# Patient Record
Sex: Female | Born: 1935 | Race: White | Hispanic: No | Smoking: Never smoker
Health system: Southern US, Community
[De-identification: ages and names within clinical notes are randomized; demographics above are authoritative.]

## PROBLEM LIST (undated history)

## (undated) DIAGNOSIS — R011 Cardiac murmur, unspecified: Secondary | ICD-10-CM

## (undated) DIAGNOSIS — H9190 Unspecified hearing loss, unspecified ear: Secondary | ICD-10-CM

## (undated) DIAGNOSIS — K8689 Other specified diseases of pancreas: Secondary | ICD-10-CM

## (undated) DIAGNOSIS — R55 Syncope and collapse: Secondary | ICD-10-CM

## (undated) DIAGNOSIS — F329 Major depressive disorder, single episode, unspecified: Secondary | ICD-10-CM

## (undated) DIAGNOSIS — R079 Chest pain, unspecified: Secondary | ICD-10-CM

## (undated) DIAGNOSIS — R2681 Unsteadiness on feet: Secondary | ICD-10-CM

## (undated) DIAGNOSIS — F419 Anxiety disorder, unspecified: Secondary | ICD-10-CM

## (undated) DIAGNOSIS — R413 Other amnesia: Secondary | ICD-10-CM

## (undated) DIAGNOSIS — R002 Palpitations: Secondary | ICD-10-CM

## (undated) DIAGNOSIS — J189 Pneumonia, unspecified organism: Secondary | ICD-10-CM

## (undated) DIAGNOSIS — IMO0001 Reserved for inherently not codable concepts without codable children: Secondary | ICD-10-CM

## (undated) DIAGNOSIS — R0602 Shortness of breath: Secondary | ICD-10-CM

## (undated) DIAGNOSIS — J8410 Pulmonary fibrosis, unspecified: Secondary | ICD-10-CM

## (undated) DIAGNOSIS — I350 Nonrheumatic aortic (valve) stenosis: Secondary | ICD-10-CM

## (undated) DIAGNOSIS — R5381 Other malaise: Secondary | ICD-10-CM

## (undated) DIAGNOSIS — M069 Rheumatoid arthritis, unspecified: Secondary | ICD-10-CM

## (undated) DIAGNOSIS — N390 Urinary tract infection, site not specified: Secondary | ICD-10-CM

## (undated) HISTORY — DX: Unspecified hearing loss, unspecified ear: H91.90

## (undated) HISTORY — DX: Other specified diseases of pancreas: K86.89

## (undated) HISTORY — DX: Syncope and collapse: R55

## (undated) HISTORY — PX: ABDOMINAL HYSTERECTOMY: SHX81

## (undated) HISTORY — DX: Major depressive disorder, single episode, unspecified: F32.9

## (undated) HISTORY — DX: Chest pain, unspecified: R07.9

## (undated) HISTORY — PX: TRANSCATHETER AORTIC VALVE REPLACEMENT, TRANSFEMORAL: SHX6400

## (undated) HISTORY — DX: Shortness of breath: R06.02

## (undated) HISTORY — PX: SHOULDER SURGERY: SHX246

## (undated) HISTORY — DX: Nonrheumatic aortic (valve) stenosis: I35.0

## (undated) HISTORY — DX: Palpitations: R00.2

## (undated) HISTORY — PX: KNEE ARTHROSCOPY: SHX127

## (undated) HISTORY — DX: Urinary tract infection, site not specified: N39.0

## (undated) HISTORY — DX: Pulmonary fibrosis, unspecified: J84.10

## (undated) HISTORY — PX: CHOLECYSTECTOMY: SHX55

## (undated) HISTORY — PX: THYROIDECTOMY: SHX17

## (undated) HISTORY — DX: Other amnesia: R41.3

---

## 1996-11-08 HISTORY — PX: CARDIAC CATHETERIZATION: SHX172

## 1999-03-01 ENCOUNTER — Other Ambulatory Visit: Admission: RE | Admit: 1999-03-01 | Discharge: 1999-03-01 | Payer: Self-pay | Admitting: Obstetrics and Gynecology

## 1999-04-08 ENCOUNTER — Inpatient Hospital Stay (HOSPITAL_COMMUNITY): Admission: EM | Admit: 1999-04-08 | Discharge: 1999-04-11 | Payer: Self-pay | Admitting: Emergency Medicine

## 1999-04-08 ENCOUNTER — Encounter: Payer: Self-pay | Admitting: Emergency Medicine

## 1999-04-09 ENCOUNTER — Encounter: Payer: Self-pay | Admitting: *Deleted

## 2000-05-29 ENCOUNTER — Other Ambulatory Visit: Admission: RE | Admit: 2000-05-29 | Discharge: 2000-05-29 | Payer: Self-pay | Admitting: Obstetrics and Gynecology

## 2001-06-29 ENCOUNTER — Other Ambulatory Visit: Admission: RE | Admit: 2001-06-29 | Discharge: 2001-06-29 | Payer: Self-pay | Admitting: Obstetrics and Gynecology

## 2001-07-21 ENCOUNTER — Encounter: Admission: RE | Admit: 2001-07-21 | Discharge: 2001-07-21 | Payer: Self-pay | Admitting: Internal Medicine

## 2001-07-21 ENCOUNTER — Encounter: Payer: Self-pay | Admitting: Internal Medicine

## 2002-10-05 ENCOUNTER — Encounter (INDEPENDENT_AMBULATORY_CARE_PROVIDER_SITE_OTHER): Payer: Self-pay | Admitting: *Deleted

## 2002-10-05 ENCOUNTER — Ambulatory Visit (HOSPITAL_COMMUNITY): Admission: RE | Admit: 2002-10-05 | Discharge: 2002-10-05 | Payer: Self-pay | Admitting: Gastroenterology

## 2002-10-19 ENCOUNTER — Encounter: Payer: Self-pay | Admitting: Emergency Medicine

## 2002-10-19 ENCOUNTER — Inpatient Hospital Stay (HOSPITAL_COMMUNITY): Admission: EM | Admit: 2002-10-19 | Discharge: 2002-11-03 | Payer: Self-pay | Admitting: Emergency Medicine

## 2002-10-20 ENCOUNTER — Encounter (HOSPITAL_BASED_OUTPATIENT_CLINIC_OR_DEPARTMENT_OTHER): Payer: Self-pay | Admitting: Internal Medicine

## 2002-10-25 ENCOUNTER — Encounter (HOSPITAL_BASED_OUTPATIENT_CLINIC_OR_DEPARTMENT_OTHER): Payer: Self-pay | Admitting: Internal Medicine

## 2002-10-26 ENCOUNTER — Encounter: Payer: Self-pay | Admitting: Gastroenterology

## 2002-10-26 ENCOUNTER — Encounter (HOSPITAL_BASED_OUTPATIENT_CLINIC_OR_DEPARTMENT_OTHER): Payer: Self-pay | Admitting: Internal Medicine

## 2002-10-27 ENCOUNTER — Encounter (HOSPITAL_BASED_OUTPATIENT_CLINIC_OR_DEPARTMENT_OTHER): Payer: Self-pay | Admitting: Internal Medicine

## 2004-04-16 ENCOUNTER — Encounter: Admission: RE | Admit: 2004-04-16 | Discharge: 2004-04-16 | Payer: Self-pay | Admitting: Internal Medicine

## 2004-08-08 ENCOUNTER — Encounter: Admission: RE | Admit: 2004-08-08 | Discharge: 2004-08-08 | Payer: Self-pay | Admitting: Gastroenterology

## 2004-10-11 ENCOUNTER — Emergency Department (HOSPITAL_COMMUNITY): Admission: EM | Admit: 2004-10-11 | Discharge: 2004-10-11 | Payer: Self-pay | Admitting: Emergency Medicine

## 2005-10-21 ENCOUNTER — Emergency Department (HOSPITAL_COMMUNITY): Admission: EM | Admit: 2005-10-21 | Discharge: 2005-10-21 | Payer: Self-pay | Admitting: Emergency Medicine

## 2005-12-06 ENCOUNTER — Encounter: Admission: RE | Admit: 2005-12-06 | Discharge: 2005-12-06 | Payer: Self-pay | Admitting: Internal Medicine

## 2005-12-23 ENCOUNTER — Ambulatory Visit (HOSPITAL_COMMUNITY): Admission: RE | Admit: 2005-12-23 | Discharge: 2005-12-23 | Payer: Self-pay | Admitting: Gastroenterology

## 2006-01-15 ENCOUNTER — Inpatient Hospital Stay (HOSPITAL_COMMUNITY): Admission: AD | Admit: 2006-01-15 | Discharge: 2006-01-21 | Payer: Self-pay | Admitting: Internal Medicine

## 2006-01-20 ENCOUNTER — Encounter (INDEPENDENT_AMBULATORY_CARE_PROVIDER_SITE_OTHER): Payer: Self-pay | Admitting: *Deleted

## 2006-12-01 ENCOUNTER — Encounter: Admission: RE | Admit: 2006-12-01 | Discharge: 2006-12-01 | Payer: Self-pay | Admitting: Gastroenterology

## 2008-02-24 ENCOUNTER — Encounter: Admission: RE | Admit: 2008-02-24 | Discharge: 2008-02-24 | Payer: Self-pay | Admitting: Internal Medicine

## 2008-04-09 ENCOUNTER — Encounter: Admission: RE | Admit: 2008-04-09 | Discharge: 2008-04-09 | Payer: Self-pay | Admitting: Internal Medicine

## 2008-05-07 ENCOUNTER — Inpatient Hospital Stay (HOSPITAL_COMMUNITY): Admission: EM | Admit: 2008-05-07 | Discharge: 2008-05-12 | Payer: Self-pay | Admitting: Emergency Medicine

## 2008-06-17 ENCOUNTER — Inpatient Hospital Stay (HOSPITAL_COMMUNITY): Admission: EM | Admit: 2008-06-17 | Discharge: 2008-06-22 | Payer: Self-pay | Admitting: Emergency Medicine

## 2008-06-21 ENCOUNTER — Ambulatory Visit: Payer: Self-pay | Admitting: Surgery

## 2008-06-21 ENCOUNTER — Encounter (HOSPITAL_BASED_OUTPATIENT_CLINIC_OR_DEPARTMENT_OTHER): Payer: Self-pay | Admitting: Internal Medicine

## 2009-10-02 ENCOUNTER — Emergency Department (HOSPITAL_COMMUNITY): Admission: EM | Admit: 2009-10-02 | Discharge: 2009-10-03 | Payer: Self-pay | Admitting: Emergency Medicine

## 2010-01-31 ENCOUNTER — Encounter: Admission: RE | Admit: 2010-01-31 | Discharge: 2010-01-31 | Payer: Self-pay | Admitting: Internal Medicine

## 2010-03-23 ENCOUNTER — Encounter: Admission: RE | Admit: 2010-03-23 | Discharge: 2010-03-23 | Payer: Self-pay | Admitting: Internal Medicine

## 2010-10-19 ENCOUNTER — Emergency Department (HOSPITAL_COMMUNITY)
Admission: EM | Admit: 2010-10-19 | Discharge: 2010-10-19 | Payer: Self-pay | Source: Home / Self Care | Admitting: Emergency Medicine

## 2010-11-12 ENCOUNTER — Encounter: Payer: Self-pay | Admitting: Orthopedic Surgery

## 2011-01-03 ENCOUNTER — Emergency Department (HOSPITAL_COMMUNITY): Payer: Medicare Other

## 2011-01-03 ENCOUNTER — Inpatient Hospital Stay (HOSPITAL_COMMUNITY)
Admission: EM | Admit: 2011-01-03 | Discharge: 2011-01-05 | DRG: 392 | Disposition: A | Discharge status: Home / Self Care | Payer: Medicare Other | Attending: Gastroenterology | Admitting: Gastroenterology

## 2011-01-03 DIAGNOSIS — E860 Dehydration: Secondary | ICD-10-CM | POA: Diagnosis present

## 2011-01-03 DIAGNOSIS — A0811 Acute gastroenteropathy due to Norwalk agent: Principal | ICD-10-CM | POA: Diagnosis present

## 2011-01-03 DIAGNOSIS — D72829 Elevated white blood cell count, unspecified: Secondary | ICD-10-CM | POA: Diagnosis present

## 2011-01-03 DIAGNOSIS — M069 Rheumatoid arthritis, unspecified: Secondary | ICD-10-CM | POA: Diagnosis present

## 2011-01-03 DIAGNOSIS — T380X5A Adverse effect of glucocorticoids and synthetic analogues, initial encounter: Secondary | ICD-10-CM | POA: Diagnosis present

## 2011-01-03 DIAGNOSIS — E2749 Other adrenocortical insufficiency: Secondary | ICD-10-CM | POA: Diagnosis present

## 2011-01-03 LAB — AMYLASE: Amylase: 24 U/L (ref 0–105)

## 2011-01-03 LAB — POCT I-STAT, CHEM 8
BUN: 22 mg/dL (ref 6–23)
Calcium, Ion: 0.91 mmol/L — ABNORMAL LOW (ref 1.12–1.32)
Chloride: 100 mEq/L (ref 96–112)
Glucose, Bld: 144 mg/dL — ABNORMAL HIGH (ref 70–99)
HCT: 49 % — ABNORMAL HIGH (ref 36.0–46.0)
Hemoglobin: 16.7 g/dL — ABNORMAL HIGH (ref 12.0–15.0)
Potassium: 3.8 mEq/L (ref 3.5–5.1)
Sodium: 138 mEq/L (ref 135–145)

## 2011-01-03 LAB — DIFFERENTIAL
Basophils Absolute: 0 10*3/uL (ref 0.0–0.1)
Eosinophils Absolute: 0.1 10*3/uL (ref 0.0–0.7)
Lymphocytes Relative: 15 % (ref 12–46)
Monocytes Relative: 10 % (ref 3–12)
Neutro Abs: 10 10*3/uL — ABNORMAL HIGH (ref 1.7–7.7)
Neutrophils Relative %: 74 % (ref 43–77)

## 2011-01-03 LAB — CBC
Hemoglobin: 15.4 g/dL — ABNORMAL HIGH (ref 12.0–15.0)
MCH: 31.3 pg (ref 26.0–34.0)
Platelets: 239 10*3/uL (ref 150–400)
RBC: 4.92 MIL/uL (ref 3.87–5.11)
WBC: 13.5 10*3/uL — ABNORMAL HIGH (ref 4.0–10.5)

## 2011-01-03 LAB — LIPASE, BLOOD: Lipase: 16 U/L (ref 11–59)

## 2011-01-04 LAB — COMPREHENSIVE METABOLIC PANEL
ALT: 16 U/L (ref 0–35)
AST: 17 U/L (ref 0–37)
Alkaline Phosphatase: 41 U/L (ref 39–117)
CO2: 24 mEq/L (ref 19–32)
Calcium: 6.5 mg/dL — ABNORMAL LOW (ref 8.4–10.5)
GFR calc Af Amer: >60 mL/min (ref >60)
GFR calc non Af Amer: >60 mL/min (ref >60)
Glucose, Bld: 142 mg/dL — ABNORMAL HIGH (ref 70–99)
Potassium: 4.1 mEq/L (ref 3.5–5.1)
Sodium: 141 mEq/L (ref 135–145)

## 2011-01-04 LAB — CBC
HCT: 39.5 % (ref 36.0–46.0)
Hemoglobin: 13 g/dL (ref 12.0–15.0)
MCHC: 32.9 g/dL (ref 30.0–36.0)
RBC: 4.15 MIL/uL (ref 3.87–5.11)
WBC: 6.5 10*3/uL (ref 4.0–10.5)

## 2011-01-04 LAB — DIFFERENTIAL
Basophils Absolute: 0 10*3/uL (ref 0.0–0.1)
Basophils Relative: 0 % (ref 0–1)
Lymphocytes Relative: 10 % — ABNORMAL LOW (ref 12–46)
Neutro Abs: 5.7 10*3/uL (ref 1.7–7.7)
Neutrophils Relative %: 89 % — ABNORMAL HIGH (ref 43–77)

## 2011-01-04 LAB — AMYLASE: Amylase: 34 U/L (ref 0–105)

## 2011-01-08 NOTE — Discharge Summary (Signed)
NAMEONEY, TATLOCK NO.:  [id number]  MEDICAL RECORD NO.:  [id number]           PATIENT TYPE:  I  LOCATION:  5527                         FACILITY:  [facility]  PHYSICIAN:  Barry Dienes. Eloise Harman, M.D.DATE OF BIRTH:  31-May-1935  DATE OF ADMISSION:  01/03/2011 DATE OF DISCHARGE:                        DISCHARGE SUMMARY - REFERRING   CHIEF COMPLAINT:  Nausea, vomiting, and diarrhea.  HISTORY OF PRESENT ILLNESS:  The patient is a 76 year old Caucasian woman who is well known to me.  She was in her usual state of fairly good health until 2 nights ago, when she developed nausea, vomiting, and diarrhea.  Her symptoms persisted the next day with multiple episodes of green-colored vomitus and brown diarrhea.  This was not associated with fever or chills, but was associated with a mild dry cough and mild shortness of breath.  She denied substernal chest pain.  She became gradually weaker and called 911, presented to the emergency room for evaluation.  In the emergency room, she was given initial high-dose IV fluids and antiemetics and currently feels bit better although she continues to have mild nausea.  She denied associated symptoms of substernal chest pain.  Her last antibiotics was Z-Pak on December 07, 2010.  At that time, she was also treated with a burst of prednisone for what seemed to be acute bronchitis with wheezing.  PAST MEDICAL HISTORY:  Moderately severe rheumatoid arthritis for which she had been treated with Enbrel in the past and methotrexate.  Due to cost concern, she is currently controlled on prednisone with last Rheumatology notes indicating a dose of 7 mg per day.  She also has had atypical chest discomfort with Cardiolite exercise test normal, recurrent pancreatitis with intermittent diarrhea felt secondary to pancreatic insufficiency, anxiety, depression, low back pain, osteoporosis, vitamin D deficiency, and chronic fatigue.  MEDICATIONS PRIOR TO  HOSPITALIZATION: 1. Alendronate 70 mg p.o. once weekly. 2. Xanax 1 mg p.o. t.i.d. 3. Amitriptyline 50 mg p.o. at bedtime. 4. Bupropion 75 mg p.o. once daily. 5. Vicodin 5/500 as needed for moderate pain. 6. Remeron 30 mg p.o. at bedtime. 7. Prednisone 7 mg p.o. daily. 8. Vitamin D 50,000 units p.o. once weekly.  ALLERGIES AND DRUG INTOLERANCES:  PENICILLIN and ARAVA was associated with diarrhea.  PAST SURGICAL HISTORY:  In 1960s, open cholecystectomy; in 1960s, total abdominal hysterectomy and bilateral salpingo-oophorectomy;  in 1970, left knee operation; in 1996, bunionectomy; in 1996, left knee operation; in 1999, left rotator cuff surgery; in July 2009, left hip fracture with open reduction and internal fixation.  PHYSICIANS INVOLVED IN CARE: 1. Octaviano Batty (Gynecology). 2. Thereasa Solo. Little, MD (Cardiology). 3. Llana Aliment. Randa Evens, MD (GI). 4. Lucrezia Starch. Earlene Plater, MD (Urology). 5. Georges Lynch. Darrelyn Hillock, MD (Orthopedics). 6. Lemmie Evens, MD (Rheumatology).  SOCIAL HISTORY:  She has been a widow since 1999, when her husband was killed while hunting.  She has 3 children and 1 granddaughter.  She was retired.  She has no history of tobacco or alcohol abuse.  FAMILY HISTORY:  Father died in his 55s from colon cancer and complications of diabetes mellitus, type 2.  Her  mother died at age 71 years.  Two of her sisters have had diabetes mellitus, and one sister died from colon cancer.  REVIEW OF SYSTEMS:  No recent fever, chills, productive cough, substernal chest pain, dysuria, frequency, or change in chronic arthralgias, affecting many joints including the bilateral shoulders, bilateral wrists and hands, and bilateral knees.  She has had no recent falls.  She denied symptoms of anxiety or depression at present.  INITIAL PHYSICAL EXAMINATION:  VITAL SIGNS:  Blood pressure 105/70, pulse 92, respirations 18, temperature 98.1, pulse oxygen saturation 95% on room air. GENERAL:  She  is a frail elderly white woman who looked fatigued while lying partially upright on the bed. HEAD, EYES, EARS, NOSE AND THROAT:  Within normal limits. NECK:  Supple without jugular venous distention or carotid bruit. CHEST:  Clear to auscultation. HEART:  Regular rate and rhythm with a systolic ejection murmur, grade 1/6 at the left sternal border. ABDOMEN:  Normal bowel sounds and no hepatosplenomegaly or tenderness. EXTREMITIES:  Significant for typical hand changes of rheumatoid arthritis and there was no peripheral edema. NEUROLOGIC:  Nonfocal.  INITIAL LABORATORY STUDIES:  Serum sodium was 138, potassium 3.8, chloride 100, BUN 22, creatinine 0.8, glucose 144, ionized calcium was 0.91, total carbon dioxide 25.  Hemoglobin 16.7, hematocrit 49, white blood cell count was 13.5 with 74% neutrophils, platelets were 239.  Chest x-ray showed mild left basilar atelectasis with no evidence of pleural effusion or pneumothorax.  IMPRESSION AND PLAN:  Nausea, vomiting, and diarrhea.  1. Nausea and vomiting:  This is most likely from a acute viral     gastroenteritis.  In our community, there have been numerous cases     of norovirus infection and her symptoms fit well for this.  She     does not appear to have a bacterial infection such as Clostridium     difficile colitis as this would likely cause some evidence of ileus     and a more extreme leukocytosis.  Her mild leukocytosis is most     likely from her chronic prednisone treatment.  Her symptoms are not     likely from acute pancreatitis although she has a history of     several episodes of well-documented pancreatitis.  We will check     serum amylase and lipase levels.  We will continue moderate dose IV     fluids and scheduled antiemetics.  We will gradually have her     transition from full liquids diet to a regular diet. 2. Adrenal insufficiency:  She has been on some dose of prednisone for     several years.  It is presumed  that she has iatrogenic adrenal     insufficiency.  We will temporary treat her with Solu-Medrol IV and     then transition her to a     rapid tapering dose of prednisone as an outpatient.  Her usual dose     of prednisone is 7 mg daily. 3. Rheumatoid arthritis:  Relatively stable on prednisone suppression.     Unfortunately, she was not able to use more modern disease-     modifying agents due to cost concerns.          ______________________________ Barry Dienes Eloise Harman, M.D.     DGP/MEDQ  D:  01/04/2011  T:  01/04/2011  Job:  [id number]  cc:   Lemmie Evens, M.D.  Electronically Signed by Jarome Matin M.D. on 01/08/2011 09:59:34 AM

## 2011-01-22 LAB — URINE MICROSCOPIC-ADD ON

## 2011-01-22 LAB — URINALYSIS, ROUTINE W REFLEX MICROSCOPIC
Bilirubin Urine: NEGATIVE
Glucose, UA: NEGATIVE mg/dL
Protein, ur: 100 mg/dL — AB
Specific Gravity, Urine: 1.016 (ref 1.005–1.030)

## 2011-01-22 LAB — CBC
Hemoglobin: 14.1 g/dL (ref 12.0–15.0)
MCHC: 34.1 g/dL (ref 30.0–36.0)
MCV: 92.4 fL (ref 78.0–100.0)
RBC: 4.47 MIL/uL (ref 3.87–5.11)
RDW: 14.3 % (ref 11.5–15.5)

## 2011-01-22 LAB — URINE CULTURE

## 2011-01-22 LAB — COMPREHENSIVE METABOLIC PANEL
BUN: 17 mg/dL (ref 6–23)
CO2: 20 mEq/L (ref 19–32)
Calcium: 8.9 mg/dL (ref 8.4–10.5)
Creatinine, Ser: 0.96 mg/dL (ref 0.4–1.2)
GFR calc non Af Amer: 57 mL/min — ABNORMAL LOW (ref >60)
Glucose, Bld: 107 mg/dL — ABNORMAL HIGH (ref 70–99)

## 2011-01-22 LAB — DIFFERENTIAL
Eosinophils Absolute: 0 10*3/uL (ref 0.0–0.7)
Lymphocytes Relative: 11 % — ABNORMAL LOW (ref 12–46)
Lymphs Abs: 1.3 10*3/uL (ref 0.7–4.0)
Neutro Abs: 9.8 10*3/uL — ABNORMAL HIGH (ref 1.7–7.7)
Neutrophils Relative %: 77 % (ref 43–77)

## 2011-01-22 LAB — LIPASE, BLOOD: Lipase: 16 U/L (ref 11–59)

## 2011-01-22 NOTE — Discharge Summary (Signed)
[name], [name] NO.:  [id number]  MEDICAL RECORD NO.:  [id number]           PATIENT TYPE:  I  LOCATION:  5527                         FACILITY:  [facility]  PHYSICIAN:  Larina Earthly, M.D.        DATE OF BIRTH:  1934-10-23  DATE OF ADMISSION:  01/03/2011 DATE OF DISCHARGE:  01/05/2011                              DISCHARGE SUMMARY   DISCHARGE DIAGNOSES: 1. Presumed viral gastroenteritis associated with significant nausea,     vomiting, and diarrhea. 2. Adrenal insufficiency, prednisone dependent for multiple years with     hemodynamic instability, on prednisone taper. 3. Rheumatoid arthritis. 4. History of recurrent pancreatitis with intermittent diarrhea,     thought secondary to pancreatic insufficiency. 5. Mood disorder with anxiety and depression with mixed features. 6. Osteoporosis. 7. Vitamin D deficiency. 8. Chronic fatigue.  DISCHARGE MEDICATIONS: 1. Tylenol 500 mg t.i.d. p.r.n. 2. Prednisone taper starting at 60 mg, decreasing to 10 mg, to be seen     by primary care physician prior to the end of the taper. 3. Alendronate 70 mg p.o. every week. 4. Alprazolam 1 mg t.i.d. p.r.n. 5. Amitriptyline 25 mg 2 tablets p.o. nightly. 6. Bupropion 75 mg p.o. q.a.m. 7. Vicodin 1 tablet t.i.d. p.r.n. 8. Remeron 30 mg p.o. nightly. 9. Vitamin D2 50,000 international units weekly.  The patient was given a prescription for the prednisone taper.  Will have followup with Dr. Jarold Motto in the next 4-5 days prior to completion of taper to assess hemodynamic stability and electrolytes and renal parameters as indicated per Dr. Eloise Harman.  LABORATORY EVALUATION AND RADIOLOGY EVALUATION FROM THE DAY PRIOR TO DISCHARGE:  White blood cell count 6.5, hemoglobin 13.0, hematocrit 37.5%, and platelet count 186.  Sodium 141, potassium 4.1, BUN 4, creatinine 0.6, glucose 142, and serum CO2 of 24.  Liver function tests normal, amylase 34 and lipase 23.  Albumin 2.7 with calcium  6.5.  Chest x-ray reveals questionable bibasilar atelectasis.  HISTORY OF PRESENT ILLNESS:  Please see the history and physical dictated by my partner Dr. Jarome Matin on January 03, 2011, however, briefly this is a 76 year old Caucasian female with the above-mentioned past medical history who develops significant of sudden onset nausea, vomiting, and diarrhea that was persistent without any blood in stool or emesis.  This is not associated with fevers or chills, but was associated with a mild dry cough and some shortness of breath . denied any substernal chest pain.  She became weaker and called 911, presented to the emergency room for evaluation, given high dose IV fluids and antiemetics with some improvement of her symptoms, but continued mild nausea.  She was subsequently admitted for presumed viral gastroenteritis, did not appear to have a bacterial infection such as C. diff and she was without any evidence of any ileus or extreme leukocytosis, it was thought that her mild leukocytosis was secondary to chronic prednisone treatment.  It was not thought that her symptoms were secondary to acute pancreatitis, however, amylase and lipase levels were checked.  She was continued on IV fluids and scheduled antiemetics.  HOSPITAL COURSE:  The patient was  treated with the IV fluids and high- dose Solu-Medrol and remained hemodynamically stable.  Within 24 hours, the patient resumed her normal diet without nausea, vomiting, or diarrhea.  She had no fevers.  Her white blood cell count normalized. Her electrolytes were reasonable.  She had no acute abdominal findings and given stability of her caloric intake she was deemed appropriate for discharge on January 05, 2011, with a prednisone taper and quick followup. During her hospitalization, she did not have an exacerbation of her rheumatoid arthritis-type symptoms and again was ambulating around her room without difficulty prior to  discharge.  Greater than 30 minutes was spent on discharge planning, the patient evaluation and interview.     Larina Earthly, M.D.     RA/MEDQ  D:  01/05/2011  T:  01/06/2011  Job:  [id number]  cc:   Barry Dienes. Eloise Harman, M.D.  Electronically Signed by Larina Earthly M.D. on 01/22/2011 10:11:49 PM

## 2011-02-06 NOTE — H&P (Signed)
[name], [name] NO.:  [id number]  MEDICAL RECORD NO.:  [id number]           PATIENT TYPE:  I  LOCATION:  5527                         FACILITY:  [facility]  PHYSICIAN:  Barry Dienes. Eloise Harman, M.D.DATE OF BIRTH:  1935-08-16  DATE OF ADMISSION:  01/03/2011 DATE OF DISCHARGE:  01/05/2011                             HISTORY & PHYSICAL   CHIEF COMPLAINT:  Nausea, vomiting, and diarrhea.  HISTORY OF PRESENT ILLNESS:  The patient is a 76 year old Caucasian woman who is well-known to me.  She was in her usual state of fairly good health until two nights prior to admission when she developed nausea, vomiting, and diarrhea.  Her symptoms persisted the next day with multiple episodes of green colored vomitus and brown diarrhea. This was not associated with fever or chills, but was associated with a dry cough and mild shortness of breath.  She denies substernal chest pain.  She became gradually weaker and called 911, presented to the emergency room for evaluation.  In the emergency room, she was given initial high-dose IV fluids and antiemetics and at the time of my evaluation felt somewhat better although she continued to have mild nausea.  She denied associated symptoms of substernal chest pain.  Her last antibiotics were Z-Pak prescribed on December 07, 2010.  At that time, she was treated with a burst of prednisone for what seemed to be acute bronchitis with wheezing.  PAST MEDICAL HISTORY:  Moderately severe rheumatoid arthritis for which she has been treated with Enbrel in the past and methotrexate.  Due to cost concerns, she was currently treated solely with prednisone with her last Rheumatology notes in our office indicating a dose of 7 mg per day. She also has a history of atypical chest discomfort with a Cardiolite exercise test normal, recurrent pancreatitis with intermittent diarrhea felt secondary to pancreatic insufficiency, anxiety, depression, low back  pain, osteoporosis, vitamin D deficiency, and chronic fatigue.  MEDICATIONS PRIOR TO HOSPITALIZATION: 1. Alendronate 70 mg p.o. once weekly. 2. Xanax 1 mg p.o. t.i.d. 3. Amitriptyline 50 mg p.o. at bedtime. 4. Wellbutrin 75 mg p.o. once daily. 5. Vicodin 5/500 1 tablet p.o. three times daily as needed for     moderate pain. 6. Remeron 30 mg p.o. at bedtime. 7. Prednisone 7 mg p.o. daily. 8. Vitamin D 50,000 units p.o. once weekly.  ALLERGIES AND DRUG INTOLERANCES:  PENICILLIN and ARAVA (associated with diarrhea).  PAST SURGICAL HISTORY:  1960 open cholecystectomy, 1960 total abdominal hysterectomy and bilateral salpingo-oophorectomy, 1970 left knee operation, 1996 bunionectomy, 1996 left knee operation, 1999 left rotator cuff surgery, July 2009 left hip fracture with open reduction and internal fixation.  PHYSICIANS INVOLVED IN CARE: 1. Warnell Forester, MD (Gynecology). 2. Julieanne Manson, MD (Cardiology). 3. Carman Ching, MD (GI). 4. Lucrezia Starch. Earlene Plater, MD(Urology). 5. Georges Lynch. Darrelyn Hillock, MD (Orthopedics). 6. Lemmie Evens, MD (Rheumatology)  SOCIAL HISTORY:  She has been a widow since 1999 when her husband was killed while hunting.  She has three children and one granddaughter. She is retired.  She has no history of tobacco or alcohol abuse.  FAMILY HISTORY:  Her  father died in his 67s from colon cancer and complications of diabetes mellitus, type 2.  Her mother died at age 36 years.  Two of her sisters have diabetes mellitus and one sister died from colon cancer.  REVIEW OF SYSTEMS:  No recent fever, chills, productive cough, substernal chest pain, dysuria, frequency, or change in chronic arthralgias affecting many joints including bilateral shoulders, bilateral wrists and hands, and bilateral knees.  She has had no recent falls.  She denied symptoms of anxiety or depression at the time of admission.  INITIAL PHYSICAL EXAMINATION:  VITAL SIGNS:  Blood pressure  105/70, pulse 92, respirations 18, temperature 98.1, pulse oxygen saturation 95% on room air. GENERAL:  She is a frail elderly white woman who looked fatigued while lying partially upright in bed. HEAD, EYES, EARS, NOSE AND THROAT:  Within normal limits. NECK:  Supple without jugular venous distention or carotid bruit. CHEST:  Clear to auscultation. HEART:  Regular rate and rhythm with a systolic ejection murmur grade 1/6 at the left sternal border. ABDOMEN:  Normal bowel sounds and no hepatosplenomegaly or tenderness. EXTREMITIES:  Significant for typical changes of rheumatoid arthritis and no peripheral edema. NEUROLOGIC:  Nonfocal.  INITIAL LABORATORY STUDIES:  Serum sodium was 138, potassium 3.8, chloride 100, BUN 22, creatinine 0.8, glucose 144, ionized calcium was 0.91, total carbon dioxide 25.  A white blood cell count was 13.5 with 74% neutrophils, hemoglobin 16.7, hematocrit 49, platelets 239.  A chest x-ray showed mild left basilar atelectasis with no evidence of pleural effusion or pneumothorax.  IMPRESSION AND PLAN: 1. Nausea and vomiting:  This is most likely from an acute viral     gastroenteritis.  In our community, there have been many cases of     norovirus infection and her symptoms fit to this.  She does not     appear to have a bacterial infection such as Clostridium difficile     colitis as this would likely cause some evidence of ileus and more     extreme leukocytosis.  She has mild elevation of her white blood     cell count that is likely from chronic prednisone treatment.  Her     symptoms are not likely from acute pancreatitis, although she has a     history of several episodes of well documented pancreatitis.  We     will continue moderate dose IV fluids and scheduled antiemetics.     We will also check serum amylase and lipase levels.  We will     gradually have her transition from a full liquid diet to a regular     diet. 2. Adrenal insufficiency:   She has been on some dose of prednisone for     several years.  It is presumed that she has iatrogenic adrenal     insufficiency.  We will temporarily treat her with Solu-Medrol IV     and then transition her to     a rapidly tapering dose of prednisone as an outpatient.  Her usual     dose of prednisone is 7 mg daily. 3. Rheumatoid arthritis:  Relatively stable on prednisone suppression.     Unfortunately, she could not afford copayments with the disease     modifying agents.          ______________________________ Barry Dienes Eloise Harman, M.D.     DGP/MEDQ  D:  01/22/2011  T:  01/23/2011  Job:  [id number]  cc:   Lemmie Evens, M.D. Thereasa Solo.  Little, M.D. James L. Malon Kindle., M.D. Warnell Forester, MD  Electronically Signed by Jarome Matin M.D. on 02/06/2011 08:05:13 AM

## 2011-02-17 ENCOUNTER — Other Ambulatory Visit: Payer: Self-pay | Admitting: Gastroenterology

## 2011-03-05 NOTE — Discharge Summary (Signed)
[name], [name] NO.:  [id number]   MEDICAL RECORD NO.:  [id number]          PATIENT TYPE:  INP   LOCATION:  1615                         [facility]ACILITY:  [facility]   PHYSICIAN:  Marlowe Kays, M.D.  DATE OF BIRTH:  1935/08/02   DATE OF ADMISSION:  05/07/2008  DATE OF DISCHARGE:                               DISCHARGE SUMMARY   DATE OF DISCHARGE:  Pending.   ADMISSION DIAGNOSES:  1. Displaced femoral neck fracture of the left hip.  2. Rheumatoid arthritis.  3. Anxiety.   DISCHARGE DIAGNOSES:  1. Displaced femoral neck fracture of the left hip.  2. Rheumatoid arthritis.  3. Anxiety.  4. Mild postoperative anemia.   OPERATION/PROCEDURE:  On May 07, 2008, the patient  the patient  underwent cemented bipolar hemiarthroplasty of the left hip.  Dooley L.  Idolina Primer, P.A.-C assisted.   CONSULTATIONS:  None.   BRIEF HISTORY:  This 76 year old white female was going to a bank here  in [place].  She was stepping up on the curb with her left foot and  it gave way.  She fell, could not get up, summoned help and was  brought to the emergency room where we saw her for an evaluation and  admission.  X-rays revealed displaced subcapital fracture of the left  hip.  She had eaten earlier in the day so we had to wait 6-8 hours after  her meal and took her to surgery for the above procedure.   COURSE IN THE HOSPITAL:  The patient tolerated the surgery quite well.  She was very slow in her early activity with physical therapy due to the  pain as well as the rather insecurity and lack of stamina with her  activities.  We felt that she would do well in a skilled nursing  facility for continued rehabilitation, physical therapy and occupational  therapy and our direction was towards that.  She had a considerable  amount of anxiety with activity, and it was felt that she would need  extra time for that to return home safely.  We allowed weightbearing as  tolerated.  She had a  mild hypotensive episode.  This was thought to be  due to the accumulation of medications including analgesics and those  were used very judiciously.   Neurovascular intact to the left lower extremity.  Dressings were  changed regularly.  Found to have some mild serous-type drainage which  was normal.   Today I discussed with the patient plans, hip precautions (which she is  following quite well).  I might add that she has a tendency to turn the  left leg inward and she is being reminded to turn the foot outward as  she ambulates.  This is a due to the adductor override of the lateral  musculature.   Once we ascertain a bed for her and find appropriate facility, then we  will go ahead with her transfer.   LABORATORY DATA:  Laboratory values in the hospital hematologically  showed a preoperative CBC with a slight elevation in white count at  11.7.  The hemoglobin was 13.1,  hematocrit 38.8.  Her hemoglobin was  postoperatively stayrd in the 10 level.  The last one on July 21  had  been 10.5.  She will continue on iron supplements.  Blood chemistries  remained normal.  Her blood pressure at dictation was 90/54.  She was  afebrile.  Electrocardiogram showed normal sinus rhythm with ST and T  wave abnormality.  Consider lateral ischemia and inferior ischemia.   DISCHARGE/PLAN:  The patient continued with her physical therapy,  weightbearing as tolerated to both lower extremities utilizing walker or  cane,  whichever she feels safe with.   DISCHARGE MEDICATIONS:  She did continue on these medications:  1. Prednisone 5 mg daily.  2. Xanax 0.5 mg tab t.i.d.  3. Propranolol 10 mg tab daily.  4. Prozac daily.  5. Indocin 10/650 tab q.4 h. p.r.n.  6. Hydrocodone 5/500 100 mg tab t.i.d. p.r.n.  7. Mirtazapine 30 mg 1 tablet at bedtime.  8. Fluoxetine HCl 20 mg capsule daily.  9. She will also continue on Robaxin 500 mg one p.o. q.6 h p.r.n.      muscle spasms.  10.Ferrous sulfate or  other iron equivalent 1 t.i.d..  11.She has been on stool softeners.  She can use stool softener of      choice at the nursing/rehab facility.   The patient is on Coumadin protocol since her surgery and has remain on  Coumadin protocol to keep the INR around 3 for at least 4 weeks after  date of surgery.  We will leave it to the pharmacist to adjust her  Coumadin.  She was taking 3 mg p.o. daily at 6 p.m. at the time of this  dictation.   She is to return to see Dr. Sedonia Small 2 weeks after date of surgery.  Staples can be removed and Steri-Strips applied 2 weeks after surgery as  well.  If you have any questions or problems, please call us at [phone], and if you have any medical concerns, her family physician is Dr.  Brunilda Payor.  Her rheumatologist is Dr. Jimmy Footman.      Dooley L. Cherlynn June.    ______________________________  Marlowe Kays, M.D.    DLU/MEDQ  D:  05/11/2008  T:  05/11/2008  Job:  [id number]   cc:   Vania Rea. Jarold Motto, MD, FACG, FACP, FAGA  [address]  [place]  [place] [address]   Lemmie Evens, M.D.  Fax: [phone]

## 2011-03-05 NOTE — H&P (Signed)
[name], POTTENGER NO.:  [id number]   MEDICAL RECORD NO.:  [id number]         PATIENT TYPE:  LINP   LOCATION:                               FACILITY:  [facility]   PHYSICIAN:  Georges Lynch. Gioffre, M.D.DATE OF BIRTH:  June 14, 1935   DATE OF ADMISSION:  05/12/2008  DATE OF DISCHARGE:                              HISTORY & PHYSICAL   CHIEF COMPLAINT:  Lower back and right buttocks pain.   HISTORY OF PRESENT ILLNESS:  This is a 76 year old female here today for  history and physical for upcoming decompressive lumbar laminectomy at L2-  3 with microdiskectomy at L2-3 on the left due to lower back and right  buttocks pain.  The patient has been evaluated by Dr. Darrelyn Hillock, found to  have significant degenerative disc disease at L2-3 with a herniated disc  with spinal stenosis at L2-3 on the left.  The patient has elected to  proceed with surgical procedure.   ALLERGIES:  PENICILLIN.   MEDICATIONS:  1. Prednisone 5 mg a day.  2. Xanax 0.5 mg three times a day.  3. Propranolol one tablet a day.  4. Prozac one tablet a day.  5. Percocet p.r.n.   PRIMARY CARE PHYSICIAN:  Barry Dienes. Eloise Harman, M.D.   RHEUMATOLOGIST:  Lemmie Evens, M.D.   GASTROENTEROLOGIST:  Llana Aliment. Randa Evens, M.D.   PAST MEDICAL HISTORY:  Anxiety/depression.  Rapid heart arrhythmia.  History of pancreatitis.  History of hiatal hernia.  Recent urinary tract infection with cystitis.   REVIEW OF SYSTEMS:  NEUROLOGIC:  Negative for any neurologic complaints.  PULMONARY:  Unremarkable.  CARDIOVASCULAR:  She just has occasional bout  of rapid heart rhythm, not as frequent as in the past.  Denies any chest  discomfort.  GI:  She has had repeated episodes of pancreatitis.  Last  was in 2006 and she was hospitalized.  She says it has been well  controlled with diet recently.  GU:  She reports about a month ago she  had urinary tract infection with cystitis, cleared with Cipro.  HEMATOLOGIC:   Unremarkable.   PAST SURGICAL HISTORY:  Positive for knee surgery x2, rotator cuff and  thyroidectomy without any complications from anesthesia.   FAMILY MEDICAL HISTORY:  Father deceased from colon cancer at age 69.  Mother has a history of heart disease and died at the age of 34.   SOCIAL HISTORY:  The patient is a widow.  She has never smoked.  No  alcohol.  Three grown children.  She lives alone.   PHYSICAL EXAMINATION:  VITAL SIGNS:  Height is 5 feet 4 inches, weight  144 pounds, blood pressure 104/60, pulse of 70 and regular, respirations  are nonlabored.  The patient is afebrile.  GENERAL:  This significant a healthy appearing, well-developed female,  conscious, alert and appropriate.  Appears to be in no extreme distress.  He gets on and off the exam table.  HEENT:  Head was normocephalic and atraumatic.  Pupils equal, round and  reactive to light.  Gross hearing is intact.  NECK:  Supple.  No palpable lymphadenopathy. Good  range of motion.  CHEST:  Lung sounds were clear and equal bilaterally.  No wheezes, rales  or rhonchi.  HEART:  Regular rate and rhythm.  No murmurs, gallops or rubs.  ABDOMEN:  Soft, nontender, bowel sounds present.  EXTREMITIES:  The patient had some loss of range of motion of her  shoulders but she had full motion of her elbows and wrists.  She had  very typical rheumatoid deformities about the metacarpophalangeal  joints.  She had 5/5 motor strength.  Lower extremities:  Both hips had  full flexion, extension, internal and external rotation without any  discomfort.  Both knees were boggy appearing.  No signs of erythema or  ecchymosis.  She had full extension.  Able to flex them back to about  110.  No instability.  Calves were soft, nontender.  She had good motion  at the ankles.  PERIPHERAL VASCULAR:  Carotid pulse were 2+.  No bruits.  Radial pulse  2+.  Posterior tibial pulses were 1+. She had some trace edema in her  right lower extremity.   Multiple varicosities throughout.  No  pigmentation changes.  NEUROLOGIC:  The patient was conscious, alert, appropriate.  A good  historian.  She had intact light touch sensation in the lower  extremities.  Motor strength is 5/5.  BREASTS, RECTAL AND GU EXAMS:  Deferred at this time.   IMPRESSION:  1. Degenerative disc disease with spinal stenosis L2-3 with herniated      disc.  2. History of anxiety/depression.  3. History of tachy-heart arrhythmia.  4. History of recurrent pancreatitis.  5. History of hiatal hernia.  6. History of frequent urinary tract infections.   PLAN:  The patient has been evaluated by Dr. Barry Dienes. Eloise Harman and has  been cleared for this upcoming surgical procedure.  The patient will  undergo all routine labs and tests prior to having a decompressive  lumbar laminectomy at L2-3 with microdiskectomy at that level by Dr.  Darrelyn Hillock.      Jamelle Rushing, P.A.    ______________________________  Georges Lynch Darrelyn Hillock, M.D.   RWK/MEDQ  D:  05/04/2008  T:  05/04/2008  Job:  [id number]   cc:   Windy Fast A. Darrelyn Hillock, M.D.  Fax: [phone]

## 2011-03-05 NOTE — Op Note (Signed)
NAMEMELEENA, [name] NO.:  [id number]   MEDICAL RECORD NO.:  [id number]          PATIENT TYPE:  INP   LOCATION:  1615                         FACILITY:  [facility]   PHYSICIAN:  Marlowe Kays, M.D.  DATE OF BIRTH:  [birth year]   DATE OF PROCEDURE:  05/07/2008  DATE OF DISCHARGE:                               OPERATIVE REPORT   PREOPERATIVE DIAGNOSIS:  Closed, displaced left femoral neck fracture.   POSTOPERATIVE DIAGNOSIS:  Closed, displaced left femoral neck fracture.   OPERATION:  Cemented bipolar hemiarthroplasty, left hip.   SURGEON:  Marlowe Kays, M.D.   ASSISTANTDruscilla Brownie. Cherlynn June.   ANESTHESIA:  General.   INDICATIONS FOR PROCEDURE:  She fell earlier this evening.  She was in  reasonable health and because the fracture was displaced I felt a  hemiarthroplasty was the treatment of choice.  Her bone quality was,  despite her age of 6, quite thin since she has been on prednisone and I  felt that the cement modality was also more appropriate than the press-  fit.   PROCEDURE IN DETAIL:  Prophylactic antibiotics, satisfactory general  anesthesia, right lateral decubitus position on the [place] frame.  Left  hip was prepped with DuraPrep and draped in sterile field.  Time-out  performed.  Ioban employed.  Posterolateral incision down through the  fascia lata.  External rotators were detached from the femur with  cutting cautery and hip capsule exposed.  It was opened in T fashion and  the flaps tagged with #1 Vicryl.  I then cleared soft tissue from around  the piriformis fossa and cleaned up the fracture site somewhat so that I  could remove the femoral head which measured 45-46 mm in diameter.  I  used a canal finder to easily go down the femoral canal and then began  the vertical reaming process along the way, used the greater  trochanteric reamer and also made my final cut of the femoral neck a  fingerbreadth above the lesser trochanter.  I  went through a 5 reamer  and then followed this with a 5 rasp which fit snugly with no remaining  cancellus bone on the calcar.  I measured for a canal plug at a size 2,  this was placed and the prosthesis once again checked to be sure that it  fit nicely.  I then cleared the acetabulum and some small bony fragments  and soft tissue and went ahead and water picked the femoral canal, mixed  the methylmethacrylate and placed 127 degree size 5 femoral stem.  We  trimmed up excess methylmethacrylate along the way.  We then checked the  acetabulum with a trial ball and 46 seemed to be the best fit and we  went through a trial reduction for length and a +5 seemed to be the best  fit.  Accordingly we went ahead and placed the final metallic ball on  the femoral component followed by the bipolar head, reduced the hip and  it was nice and stable.  I reapproximated the capsule as much as flap  tissue would  allow with interrupted #1 Vicryl.  We then reattached the  external rotators to the femur with the same.  Fascia lata was closed  with #1 Vicryl and 2-0  Vicryl superficially, staples in the skin.  Betadine, Adaptic, dry  sterile dressing were applied.  She was gently placed on her back on the  OR table and knee immobilizer and taken to recovery in satisfactory  condition with no known complications.  Estimated blood loss was 500 mL.  No blood replacement.           ______________________________  Marlowe Kays, M.D.     JA/MEDQ  D:  05/07/2008  T:  05/08/2008  Job:  [id number]

## 2011-03-05 NOTE — Consult Note (Signed)
[name], ROLL NO.:  [id number]   MEDICAL RECORD NO.:  [id number]          PATIENT TYPE:  OBV   LOCATION:  1316                         FACILITY:  [facility]   PHYSICIAN:  Petra Kuba, M.D.    DATE OF BIRTH:  02/04/1935   DATE OF CONSULTATION:  06/17/2008  DATE OF DISCHARGE:                                 CONSULTATION   We were asked to see [name] today by Dr. Adrian Prince in  consultation for acute onset nausea, vomiting, and diarrhea.   HISTORY OF PRESENT ILLNESS:  This is a 76 year old female with a history  of recurrent pancreatitis and frequent diarrhea, who is a patient of Dr.  Carman Ching at [facility].  She had a recent hip repair May 07, 2008.  On Monday of this week, three days ago, she returned home from  rehab at [facility].  Tuesday evening she said she felt  lightheaded and Wednesday morning she awoke with nausea, vomiting, and  diarrhea.  The vomiting was yellow bile.  This was limited and stopped  on Wednesday.  The diarrhea has continued three to five times a day.  She has no blood.  She is feeling better today.  The patient tells me  that for the past 4 to 5 years, she gets diarrhea frequently and is  uncertain as to why that happens.  She has been on an antibiotic  recently; she cannot remember the name.  She had an infection in her hip  incision and was given an antibiotic by Dr. Leanord Hawking approximately two  weeks ago.  The incision has cleared up nicely.  She describes no pain  now, other than a pain in her back from a ruptured disk.  She has no  NSAID exposure.  She has not had heartburn or indigestion.  Currently  she does not feel like eating much.  Her main complaint now is her  diarrhea.   PAST MEDICAL HISTORY:  1. Hip fracture repair on May 07, 2008.  2. Rheumatoid arthritis.  She is steroid dependent.  3. Recurrent idiopathic pancreatitis.  4. GERD.  5. Chronic fatigue.  6. Anxiety.  7. Her last  colonoscopy was in 2007 by Dr. Randa Evens.  He found sigmoid      diverticulosis.  A copy of that report is on the chart.   PAST SURGICAL HISTORY:  1. Cholecystectomy.  2. Hysterectomy with bilateral salpingo-oophorectomy.  3. Left knee surgery.  4. Left rotator cuff surgery.  5. Hip repair, as stated above May 07, 2008.   CURRENT MEDICATIONS:  1. Prednisone 5 mg daily.  2. Xanax.  3. Propranolol.  4. Indocin.  5. Prozac.  6. Robaxin.  7. Mirtazapine.  8. Vicodin.   ALLERGIES:  She has an allergy to PENICILLIN.   REVIEW OF SYSTEMS:  Significant for headache times two days, back pain  from her ruptured disk.   SOCIAL HISTORY:  She is widowed.  Does not drink, does not smoke any  tobacco.   FAMILY HISTORY:  Significant for her father who had colon cancer and her  sister who had bone cancer.  She also tells me she has a niece who now  has brain cancer, but has chronic diarrhea much in the same way she  does.   PHYSICAL EXAMINATION:  GENERAL:  She is alert and oriented, very  pleasant to speak with.  ABDOMEN:  Soft, nontender, and nondistended, with positive bowel sounds.  VITAL SIGNS:  Temperature is 98.0, pulse 78, respirations 18, blood  pressure is 104/62.   LABORATORY DATA:  Her B-met is within normal limits.  Her LFTs are  within normal limits.  Her CBC shows a hemoglobin of 11.2, white count  8.0, hematocrit 33.9, platelets 219.  Amylase is 63.  June 15, 2008,  she had a C. diff that was negative.  She has a second C. diff pending.   RADIOLOGICAL EXAMS:  Include an acute abdominal x-ray done on June 14, 2008, that was essentially negative.   ASSESSMENT:  Acute onset nausea, vomiting, and diarrhea in a patient  just home from skilled nursing facility rehabilitation.  I believe this  diarrhea is infectious/viral.  Clostridium difficile is a likely  candidate, given her recent antibiotic exposure for her incision  infection, as well as her prednisone and her  recent stay at a skilled  nursing facility.  This does not look like a pancreatic problem, given  her lack of pain and her normal amylase.  I also do not believe that it  is any type of gastritis or ulcer disease.  Even though she has Indocin  listed in her medication list, she tells me that she only takes Extra-  Strength Tylenol and she does not recall taking Indocin.  I believe that  Dr. Evlyn Kanner has already taken all the appropriate steps.  She is on Cipro  and Flagyl, IV fluids.  She has stool cultures still pending.  We will  follow along with you.  Will do a flex-sig or possibly and endo if  symptoms continue to worsen, but anticipate that the patient will  continue to improve and hopefully go home soon.   Thanks very much for this consultation.      Stephani Police, PA    ______________________________  Petra Kuba, M.D.    MLY/MEDQ  D:  06/17/2008  T:  06/17/2008  Job:  [id number]   cc:   Fayrene Fearing L. Malon Kindle., M.D.  Fax: [phone]   Tera Mater. Evlyn Kanner, M.D.  Fax: [phone]

## 2011-03-05 NOTE — H&P (Signed)
[name], [name] NO.:  [id number]   MEDICAL RECORD NO.:  [id number]          PATIENT TYPE:  INP   LOCATION:  0104                         FACILITY:  [facility]   PHYSICIAN:  Marlowe Kays, M.D.  DATE OF BIRTH:  1934-11-17   DATE OF ADMISSION:  05/07/2008  DATE OF DISCHARGE:                              HISTORY & PHYSICAL   CHIEF COMPLAINT:  Pain in my left hip.   PRESENT ILLNESS:  A 76 year old white female was at a local bank,  stepping up on a curb with her left foot/leg to go to an ATM machine.  Her left leg suddenly gave away, and she fell.  She summoned help and  was brought to the emergency room where x-rays revealed a displaced  subcapital fracture of the left hip.  She sustained no other injuries.  It was decided to admit her.  She is a very active lady.  It was decided  to admit her for cemented hemiarthroplasty of the left hip.   PAST MEDICAL HISTORY:  1. Rheumatoid arthritis.  2. Chronic back pain.  3. Anxiety.   Dr. Jimmy Footman is her physician.   FAMILY HISTORY:  Noncontributory.   SURGICAL HISTORY:  No tobacco, no alcohol.   DRUG ALLERGIES:  PENICILLIN.   CURRENT MEDICATIONS:  Prednisone, Percogesic, Xanax, Prozac, and  propranolol.   REVIEW OF SYSTEMS:  CNS:  No seizure, stroke, paralysis, numbness,  double-vision.  RESPIRATORY:  No productive cough, no hemoptysis.  No  shortness of breath.  CARDIOVASCULAR:  No chest pain, no angina, or  orthopnea.  GASTROINTESTINAL:  No nausea, vomiting, melena, or bloody  stools.  GENITOURINARY:  She has stress incontinence but no dysuria.   PHYSICAL EXAMINATION:  GENERAL:  Alert, cooperative, friendly 73-year-  old white female seen on emergency room stretcher who is accompanied by  her daughter.  VITAL SIGNS:  Temperature 97.9, pulse 73, respirations 24, blood  pressure 141/83.  HEENT:  Normocephalic.  PERRLA.  EOM intact.  Oropharynx is clear.  CHEST:  Clear to auscultation.  No rhonchi or  rales.  HEART:  Regular rate and rhythm.  No murmurs are heard.  ABDOMEN:  Soft, nontender.  Bowel sounds present.  GENITALIA/RECTAL/PELVIC/BREASTS:  Not done.  Not pertinent for present  illness.  EXTREMITIES:  Left lower extremity is externally rotated, slightly  shortened.  NEUROVASCULAR:  Grossly intact.   ADMITTING DIAGNOSES:  1. Displaced femoral neck fracture of the left hip.  2. Rheumatoid arthritis.  3. Anxiety.   PLAN:  The patient will undergo cemented bipolar hemiarthroplasty of the  left hip.  Should we have any medical problems, we will certainly ask  Dr. Jimmy Footman to follow along with [place].      Dooley L. Cherlynn June.    ______________________________  Marlowe Kays, M.D.    DLU/MEDQ  D:  05/07/2008  T:  05/07/2008  Job:  [id number]   cc:   Lemmie Evens, M.D.  Fax: [phone]

## 2011-03-05 NOTE — Discharge Summary (Signed)
NAMECORNESHA, RADZIEWICZ NO.:  [id number]   MEDICAL RECORD NO.:  [id number]          PATIENT TYPE:  INP   LOCATION:  1615                         [facility]ACILITY:  [facility]   PHYSICIAN:  Marlowe Kays, M.D.  DATE OF BIRTH:  [birth year]   DATE OF ADMISSION:  05/07/2008  DATE OF DISCHARGE:                               DISCHARGE SUMMARY   ADDENDUM:  Mrs. Baik was having nausea from the iron supplement given  her here in the hospital.  This was discontinued, and she need not take  it when she arrives at the skilled nursing facility.  Today, she  continued with some mild drainage through her dressing.  She was awake  and alert and oriented and aware of her transfer plans.  She is to wear  her knee immobilizer at all times when she is at rest and awake.  It may  be removed for skin care with hydrating lotions to the left lower  extremity.  Otherwise, it should be on at all times.  Again, if you have  any questions orthopedically, please call [phone] for Dr. Leim Fabry  staff.      Dooley L. Cherlynn June.    ______________________________  Marlowe Kays, M.D.    DLU/MEDQ  D:  05/12/2008  T:  05/12/2008  Job:  [id number]   cc:   Barry Dienes. Eloise Harman, M.D.  Fax: [phone]

## 2011-03-05 NOTE — Discharge Summary (Signed)
[name], [name] NO.:  [id number]   MEDICAL RECORD NO.:  [id number]          PATIENT TYPE:  INP   LOCATION:  1316                         FACILITY:  [facility]   PHYSICIAN:  Barry Dienes. Eloise Harman, M.D.DATE OF BIRTH:  1935-01-09   DATE OF ADMISSION:  06/15/2008  DATE OF DISCHARGE:  06/22/2008                               DISCHARGE SUMMARY   PERTINENT FINDINGS:  The patient is a 76 year old Caucasian woman who  was recently released from skilled nursing facility rehabilitation after  left hip fracture with surgical repair.  She had been treated for a  wound infection with antibiotics.  On the morning of admission, she  awoke with nausea, vomiting, and several loose bowel movements.  She  presented to the emergency room weak and lightheaded with a complaint of  moderate nausea.  She had not had recent fever or chills or hematemesis  or rectal bleeding.  She also denied substernal chest pain.  In the  emergency room, her cardiac isoenzymes were normal, and acute abdomen  series x-rays were unremarkable.  She was admitted for further  evaluation.   PAST MEDICAL HISTORY:  1. Left hip fracture with surgical repair in July of 2009.  2. Rheumatoid arthritis that is steroid dependent.  3. Osteoporosis with vitamin D deficiency.  4. Anxiety and depression.  5. Recurrent pancreatitis with ERCP in 1991 and MRCP in 2008 not      showing obstruction.  6. Colonoscopy in 1998 that was negative.  7. Diverticula on barium enema in 1993.  8. History of atypical chest pain with normal cardiac catheterization      in 1998 and normal Cardiolite exercise test in 2007.  9. History of gastroesophageal reflux.  10.Chronic fatigue.   PAST SURGICAL HISTORY:  1. Cholecystectomy.  2. Partial hysterectomy.  3. Left knee surgery.  4. Bunionectomy.  5. Left rotator cuff.  6. Total abdominal hysterectomy and bilateral salpingo-oophorectomy.  7. Lip fracture with surgical repair.   MEDICATIONS PRIOR TO ADMISSION:  1. Prednisone 5 mg daily.  2. Xanax 0.5 mg t.i.d.  3. Propranolol 10 mg daily.  4. Prozac 20 mg daily.  5. Vicodin p.r.n. pain.  6. Remeron 30 mg q.h.s.  7. Over-the-counter stool softeners.   ALLERGIES:  PENICILLIN.   INITIAL PHYSICAL EXAMINATION:  VITAL SIGNS:  Blood pressure 122/76,  pulse 85, respirations 18, temperature 97.7, pulse oxygen saturation  97%.  GENERAL:  She is a well-nourished, well-developed white female who is in  no apparent distress.  HEENT:  Within normal limits.  NECK:  Supple without jugular venous distention or carotid bruit.  CHEST:  Clear to auscultation.  HEART:  Regular rate and rhythm with a systolic ejection murmur of grade  1/6 at the left sternal border.  ABDOMEN:  Normal bowel sounds with mild tympany and mild distention, but  no tenderness or splenomegaly.  EXTREMITIES:  A well-healed scar along the lateral aspect of the left  hip.  Peripheral pulses were normal, and there was no edema.  NEUROLOGIC:  She was alert and well-oriented with no focal neurologic  deficits.   INITIAL  LABORATORY STUDIES:  Serum amylase was 63, lipase 18.  Stool  hemoccult test was negative.  Serum sodium 144, potassium 3.5, chloride  106, carbon dioxide 28, BUN 7, creatinine 0.6, glucose 114.  SGOT 22,  SGPT 11, alkaline phosphatase 81, total protein 6.8, albumin 3.9,  calcium 9.4, troponin I less than 0.05.  White blood cells 10.7,  hemoglobin 12.8, platelets 277.  Urinalysis had 21-50 white blood cells  and rare red blood cells and was nitrite positive.   HOSPITAL COURSE:  The patient was admitted to a medical bed without  telemetry.  She had initial treatment with stress dose corticosteroids  and IV fluids.  She was seen by a GI consultant who felt that her  diarrhea and nausea could be infectious or viral, but that C. difficile  colitis was possible, given recent antibiotic exposure.  He did not feel  that her symptoms were due  to pancreas problems, given her normal  pancreatic enzymes.  If her symptoms persisted, he recommended doing a  flexible sigmoidoscopy; otherwise, continuing with IV fluids and  medications for nausea and diarrhea.  With IV fluids, her symptoms  improved gradually.  In addition, pancreatic enzyme replacement was  started with decrease in diarrhea and nausea.  She was treated with low-  dose Reglan with unclear benefits.  She had stool specimens tested for  C. difficile that were negative x3, and stool cultures were negative.  She had been empirically placed on Bactrim for a possible urinary tract  infection; however, urine culture showed multiple organisms, so this was  discontinued.  She had a D-dimer test come back at 2.15 which was mildly  elevated, so she had a left lower extremity DVT ultrasound that was  normal.  She also had an abdomen ultrasound exam that showed that she  was status post cholecystectomy with common bile duct diameter 9.3 mm  and no acute findings.   CONDITION ON DISCHARGE:  Vital signs - blood pressure 119/68, pulse 96,  respirations 18, temperature 98.1, pulse oxygen saturation 91-94% on  room air.  She no longer had any nausea, and she continued to have  several small loose brown bowel movements, but had been eating  reasonably well with a 48-hour calorie count showing that she was  meeting 77% of her needs.  Her chest was clear to auscultation.  Heart  had a regular rate and rhythm with a systolic ejection murmur, grade 1/6  in the left sternal border.  Abdomen had normal bowel sounds and no  tympany or tenderness.  Extremities were without cyanosis, clubbing, or  edema.   PROCEDURES:  1. Acute abdomen series x-rays.  2. Abdomen ultrasound exam.  3. Left lower extremity DVT ultrasound.   COMPLICATIONS:  None.   DISCHARGE DIAGNOSES:  1. Nausea, resolved.  2. Diarrhea, uncertain etiology with Clostridium difficile testing      negative.  3. Rheumatoid  arthritis, prednisone dependent.  4. Anxiety.  5. Depression.  6. Multiple episodes of pancreatitis.  7. Gastroesophageal reflux disease with hiatal hernia.   DISCHARGE MEDICATIONS:  1. Prednisone 5 mg p.o. daily.  2. Xanax 0.5 mg p.o. t.i.d.  3. Propranolol 10 mg p.o. daily.  4. Decrease in Prozac to 20 mg every Monday, Wednesday, and Friday.  5. Vicodin 5/500 one tablet p.o. t.i.d. p.r.n. pain, #90, refill two.  6. Remeron 30 mg p.o. q.h.s.  7. Senna 1 or 2 tablets daily p.r.n. constipation.  8. Creon 10 one capsule p.o. t.i.d. with meals (  she was advised that      this is an expensive medicine and that she could try going without      and seeing if her diarrhea worsened; if so, then to start the      medicine).  9. Florastor 250 mg once daily for the next 2 weeks.  10.Omeprazole 20 mg daily for 28 days.   DISPOSITION AND FOLLOW UP:  She should have a follow-up appointment with  Dr. Jarome Matin at [facility] by calling [phone]  for a follow-up visit in approximately 1 week.  She should also call [phone] to arrange a follow-up visit with Dr. Carman Ching in  approximately 2-4 weeks.  She was advised to call Dr. Eloise Harman if her  symptoms worsened in the interim.           ______________________________  Barry Dienes Eloise Harman, M.D.     DGP/MEDQ  D:  06/22/2008  T:  06/22/2008  Job:  [id number]   cc:   Fayrene Fearing L. Malon Kindle., M.D.  Fax: [phone]   Marlowe Kays, M.D.  Fax: [phone]

## 2011-03-05 NOTE — H&P (Signed)
NAMEARSHIA, SPELLMAN NO.:  [id number]   MEDICAL RECORD NO.:  [id number]          PATIENT TYPE:  OBV   LOCATION:  0108                         FACILITY:  [facility]   PHYSICIAN:  Tera Mater. Evlyn Kanner, M.D. DATE OF BIRTH:  [birth year]   DATE OF ADMISSION:  06/15/2008  DATE OF DISCHARGE:                              HISTORY & PHYSICAL   [name] is a 76 year old white female recently released from rehab  after hip fracture.  She had been home since Monday and today is  Thursday.  This morning she awakened at 5 a.m. with acute episode of  nausea and vomiting followed by several loose stools.  She had only a  piece of dry toast earlier today and has unable to keep anything else  down.  She is weak and dizzy and has been in the ER for several hours  with hydration, antiemetics without much help.  Her symptoms are not  similar to prior pancreas attacks, and she has no abdominal pain but  feels open and empty from the mid abdomen up.  She really has not  taken much n.p.o. with the liquids that had been offered her today.  She  has had two loose stools since here in the emergency room.  She had no  fevers, chills or sweats.  She has not seen any blood anywhere.  She had  no cardiac pain.  Cardiac markers are negative.  She has no shortness of  breath.  She has chronic lumbar back pain with a planned repair of a  disk in the upcoming future.  She did have some antibiotics about a week  ago, she cannot remember exactly which ones these were.  Her joint  situation has been under modest control.  She does not note any other  new complaints.   PAST MEDICAL HISTORY:  Her past medical history includes recent hip  fracture with repair.  She has history of her rheumatoid arthritis that  is steroid dependent, osteoporosis with vitamin D deficiency, anxiety  and depression, recurrent pancreatitis and an ERCP in 1991. She had an  MRCP in 2008.  She has had a colonoscopy in 1998, it was  negative, and  diverticula on barium enema in 1993.  She has had atypical chest pain  with normal cath in 1998, gastroesophageal reflux and chronic fatigue.   SURGICAL HISTORY:  Surgical history includes cholecystectomy, partial  hysterectomy, left knee surgery, bunionectomy, left knee left rotator  cuff, TAH-BSO and recent hip fracture.   FAMILY HISTORY:  Father died at 70 of colon cancer and diabetes.  Mother  died at 60.  No history of breast cancer in the family. Says two sisters  with diabetes, two brothers with epilepsy, a sister died of bone cancer.  She has three children and several grandchildren.   SOCIAL HISTORY:  She is widowed since 1995.  She is retired.  She is a  nonsmoker, nondrinker.   MEDICATIONS:  Medication list from her recent discharge includes  prednisone 5 daily, Xanax 0.5 three times a day, propranolol 10 once  daily, Prozac 20 daily.  She had p.r.n. for hydrocodone, Remeron 30 at  bedtime, and the stool softeners.   ALLERGIES:  Allergies in our records are listed as PENICILLIN which  causes a rash.   PHYSICAL EXAMINATION:  VITAL SIGNS:  On exam here in the emergency room  blood pressure initially was 122/76, pulse was 85, as high as 117 noted,  respirations 18, temperature 97.7, O2 sats 97%.  GENERAL:  We have a fairly healthy-appearing white female sitting up no  distress.  HEENT:  Sclerae anicteric.  Face is symmetrical.  Mucous membranes are  moist.  No oropharyngeal lesions are present.  NECK:  Neck is supple without bruits or thyromegaly.  LUNGS: Clear without wheezes, rales or rhonchi.  HEART:  Regular with a soft systolic murmur.  ABDOMEN:  Abdomen is mildly to moderately distended with well-healed  scars.  It is mildly tympanic with no areas of rebound.  No area of  tenderness and bowel sounds are somewhat hyperactive.  No masses or  pulsations could be felt.  EXTREMITIES:  Extremities revealed a well-healed scar along the left  hip, pulses  are intact distally, peripheral pulses are intact.  No edema  is present.  NEURO:  The patient is awake, alert.  Her mentation is excellent.  Speech is clear.  No resting tremors present.   LABORATORY DATA:  Lab data and x-ray of the abdomen showed no abdominal  disease.  Chest X-Ray was negative.   Other lab data: Amylase and lipase were both normal at 63 and 18  respectively, fecal occult blood was negative.  Chemistries reveal  sodium 144, potassium 3.5, chloride 106, CO2 28, BUN 7, creatinine 0.6,  glucose 114.  Liver testing was normal with SGOT of 22, SGPT 11, alk  phos of 81, total protein 6.8, albumin 3.9 and calcium 9.4.  Cardiac  markers were normal with a troponin less than 0.05.  White count was  10,700, hemoglobin 12.8, platelets 277,000. Urinalysis showed 21-50  white cells and rare red cells.  Otherwise urinalysis did show some  ketones and urobilinogen and positive nitrite.   In summary we have a 76 year old white female presenting with nausea,  vomiting, diarrhea.  She does have significant urinary tract infection,  but I do not think this represents pyelonephritis with a normal blood  pressure and no fever and no real leukocytosis.  It is interesting her  back pain could be masking flank pain.  It is hard to tell.  At the  present time I think hydrating her and watching carefully is the right  plan of action.  There is no evidence of recurrent pancreatitis.  I  think that an extra dose of prednisone tonight is a worthwhile thing as  well, although there is no evidence of any adrenal insufficiency as the  proximate cause of this.  At the present time because she does live  alone and has these medical illnesses, I think she does need to be  observed at least overnight if not a bit longer.  Repeat labs will be  done in the morning.  We will be watching her in a regular bed.           ______________________________  Tera Mater. Evlyn Kanner, M.D.     SAS/MEDQ  D:   06/15/2008  T:  06/15/2008  Job:  [id number]

## 2011-03-08 NOTE — H&P (Signed)
NAME:  [name], [name]                         ACCOUNT NO.:  [id number]   MEDICAL RECORD NO.:  [id number]                   PATIENT TYPE:  INP   LOCATION:  0378                                 FACILITY:  [facility]   PHYSICIAN:  Barry Dienes. Eloise Harman, M.D.            DATE OF BIRTH:  1934-11-11   DATE OF ADMISSION:  10/19/2002  DATE OF DISCHARGE:                                HISTORY & PHYSICAL   CHIEF COMPLAINT:  Abdominal pain.   HISTORY OF PRESENT ILLNESS:  The patient is a 76 year old white female who  is well known to me.  At approximately 0900 hours today she developed the  fairly sudden onset of severe (greater than 10/10 intensity) epigastric pain  associated with nausea and vomiting x1.  She presented to the [facility] for evaluation.  On September 29, 2002, office visit  note, she presented with epigastric pain.  However, her labs were  significant for a serum amylase level of 37, and normal liver associated  transaminases.  Her symptoms gradually resolved.  She denies a history of  alcohol abuse but has had at least four episodes of pancreatitis in the  past.  In the 1990s, an ERCP was attempted but was unsuccessful presumably  due to multiple diverticula in the region of the pancreatic duct.  A  magnetic resonance cholangiopancreatogram was done in 2000 that reportedly  was within normal limits.  She also has a history of atypical chest pain  with a 1998 cardiac catheterization normal and symptoms felt due to  gastroesophageal reflux disease.  She has a history of anxiety and  depression, rheumatoid arthritis, cervical and lumbar spine degenerative  disk disease, osteoporosis, and a penicillin allergy.   MEDICATIONS PRIOR TO ADMISSION:  1. Prednisone 4 mg p.o. q.d.  2. Celebrex 200 mg p.o. q.d.  3. Elavil 50 mg p.o. q.h.s.  4. Prozac 20 mg p.o. q.d.  5. Folate 1 mg p.o. q.d.  6. Methotrexate one dose p.o. once weekly.  7. Xanax 0.25 mg p.o. t.i.d.  p.r.n. anxiety.  8. Fosamax 70 mg p.o. once weekly.  9. Celebrex 200 mg p.o. once daily.   ALLERGIES:  PENICILLIN.   PAST SURGICAL HISTORY:  1. Open cholecystectomy in 1960s.  2. Remote total abdominal hysterectomy and bilateral salpingo-oophorectomy.  3. Left knee operation 1976.  4. Bunionectomy 1996.  5. Left knee operation 1996.  6. Left rotator cuff repair 1996.  7. Remote history of total thyroidectomy for a multinodular goiter.   FAMILY HISTORY:  Father died in his 13s of colon cancer and had a history of  diabetes mellitus, type 2.  Mother died at age 19 of uncertain causes.  A  sister died at age 66 of bone cancer.  She has two other sisters who are  alive and well.  Family history is significant for others with diabetes  mellitus and colon cancer.   SOCIAL  HISTORY:  She has been on widow since her husband died in a hunting  accident in 1999.  She is retired and has no history of tobacco or alcohol  abuse.  She has a granddaughter who is in her teens who lives with her.  She  has three children (one son and two daughters).   REVIEW OF SYSTEMS:  She has fever today and intermittent dysuria with  multiple arthralgias.  She denies recent cough, shortness of breath,  diarrhea, or constipation.   PHYSICAL EXAMINATION:  VITAL SIGNS:  Temperature 100.8 degrees.  Blood  pressure 81/53, pulse 117, respiratory rate 18.  GENERAL:  In general, she is an elderly white female with moderate  epigastric pain who is moving in the exam bed due to abdominal pain.  HEENT:  Exam was within normal limits.  NECK:  Without jugular venous distention or carotid bruits.  CHEST:  The chest was clear to auscultation.  HEART:  Regular rate and rhythm.  S1 and S2 were present without murmur,  gallop or rub.  ABDOMEN:  The abdomen had markedly decreased bowel sounds with moderate  epigastric tenderness and no rebound.  NEUROLOGIC:  She is alert and oriented x 3 with no focal neurological   deficits.   LABORATORY DATA:  Acute abdomen series included chest x-ray which was normal  and abdominal x-ray showed a possible small left renal stone.  Urinalysis  was nitrite positive with 21-50 white blood cells and many bacteria.  Alkaline phosphatase 62.  SGOT 15, total bilirubin 0.9, serum amylase 2017,  serum lipase 2365.  White blood cell count 12.3, hemoglobin 12.9, hematocrit  38.5, platelet count 217,000.  Serum sodium 138, potassium 3.6, chloride  103, CO2 27, BUN 13, creatinine 0.7.  Glucose 127.   IMPRESSION AND PLAN:  1. Pancreatitis.  This is recurrent and of acute onset.  Her past workup has     apparently been unremarkable.  Currently her blood pressure is decreased     due to inadequate fluid resuscitation and intravascular volume depletion.     I plan to increase the rate of her IV fluid administration and continue     narcotic treatment for her moderately severe epigastric pain.  2. Fever.  This is likely due to pancreatitis and less likely due to early     pyelonephritis.  I plan to obtain urine cultures and start empiric Tequin     treatment.  3. Rheumatoid arthritis.  This has been reasonably stable on prednisone with     methotrexate.  Due to ongoing prednisone treatment she should be assumed     to be adrenally suppressed.  Accordingly, I plan to increase the     prednisone to at least the equivalent of 20 mg daily.                                               Barry Dienes Eloise Harman, M.D.    DGP/MEDQ  D:  10/19/2002  T:  10/20/2002  Job:  [id number]   cc:   Fayrene Fearing L. Malon Kindle., M.D.  [address]  [place]  [place] [address]  Fax: [phone]   Thereasa Solo. Little, M.D.  [address]  [place] [address]  Fax: [phone]   Lucrezia Starch. Ovidio Hanger, M.D.  [address]  [place] [address]  Fax: [phone]   Lemmie Evens, M.D.  [address] [place]  [place] [address]  Fax: [phone]

## 2011-03-08 NOTE — Op Note (Signed)
NAMENAYELLY, [name]               ACCOUNT NO.:  [id number]   MEDICAL RECORD NO.:  [id number]          PATIENT TYPE:  AMB   LOCATION:  ENDO                         FACILITY:  [facility]   PHYSICIAN:  James L. Malon Kindle., M.D.DATE OF BIRTH:  11-Apr-1935   DATE OF PROCEDURE:  12/23/2005  DATE OF DISCHARGE:                                 OPERATIVE REPORT   PROCEDURE PERFORMED:  Colonoscopy.   ENDOSCOPIST:  Llana Aliment. Randa Evens, M.D.   MEDICATIONS:  Fentanyl 100 mcg, Versed 10 mg IV, Phenergan 12.5 mg IV.   INSTRUMENT USED:  Olympus pediatric adjustable colonoscope.   INDICATIONS FOR PROCEDURE:  The patient has had a personal history of polyps  and strong family history of colon cancer.  This is done as a follow-up  exam.   DESCRIPTION OF PROCEDURE:  The procedure had been explained to the patient  and consent obtained.  With the patient in the left lateral decubitus  position, the Olympus scope was inserted and advanced.  Patient had marked  diverticular disease.  After some time, we were able to finally pass the  sigmoid colon and advance on to the cecum.  The ileocecal valve and  appendiceal orifice were seen.  The scope was withdrawn and the cecum,  ascending colon, transverse, descending and sigmoid colon were seen well and  no polyps were seen throughout and moderate diverticular disease in the  sigmoid colon but no polyps.  The rectum was free of polyps.  The scope was  withdrawn.  The patient tolerated the procedure well.   ASSESSMENT:  1.  History of colon polyps with negative colonoscopy, V12.72.  2.  Family history of colon cancer, V16.0.  3.  Marked diverticulosis, 562.10.   PLAN:  Will recommend yearly  Hemoccults and will recommend repeating  colonoscopy in five years with follow-up office visit p.r.n.           ______________________________  Llana Aliment. Malon Kindle., M.D.     Waldron Session  D:  12/23/2005  T:  12/23/2005  Job:  [id number]   cc:   Barry Dienes. Eloise Harman, M.D.  Fax: [phone]   Lemmie Evens, M.D.  Fax: [phone]

## 2011-03-08 NOTE — Discharge Summary (Signed)
[name], [name] NO.:  [id number]   MEDICAL RECORD NO.:  [id number]          PATIENT TYPE:  INP   LOCATION:  4714                         FACILITY:  [facility]   PHYSICIAN:  Barry Dienes. Eloise Harman, M.D.DATE OF BIRTH:  1935/02/26   DATE OF ADMISSION:  01/15/2006  DATE OF DISCHARGE:  01/21/2006                                 DISCHARGE SUMMARY   PERTINENT FINDINGS:  The patient is a 76 year old white female who was  admitted to the hospital with a month-long history of diarrhea following  most meals that was associated with fever and vague epigastric pain.  For  the past week, she has been sleepy much of the time and somewhat unsteady on  her feet.  She had not had any significant falls.  She felt that all foods  seemed to go right through her.  She denied fever, chills, recent  antibiotic use, blood in her bowel movements, nausea, or vomiting.   PAST MEDICAL HISTORY:  1.  Recurrent pancreatitis.  2.  Atypical chest pain with 1998 cardiac catheterization unremarkable.  3.  Gastroesophageal reflux disease and hiatal hernia.  4.  Anxiety.  5.  Depression.  6.  Chronic low back pain.  7.  Bilateral hand pain and right wrist edema.  8.  Rheumatoid arthritis.  9.  Chronic prednisone treatment.  10. Osteoporosis with documented vitamin D deficiency.   MEDICATIONS PRIOR TO ADMISSION:  1.  Prednisone 5 mg p.o. daily.  2.  Folate 1 mg p.o. daily.  3.  Xanax 0.5 mg p.o. b.i.d. and 1 mg p.o. q.h.s.  4.  Senokot S 1 tablet p.o. daily p.r.n. constipation.  5.  Milk of magnesia 30 mL p.o. daily p.r.n. constipation.  6.  Arava 20 mg p.o. daily.  7.  Amitriptyline 50 mg p.o. q.h.s.  8.  Fluoxetine 20 mg p.o. daily.  9.  Methotrexate 10 mg p.o. weekly.  10. Ultram 50 mg p.o. t.i.d. p.r.n. pain.  11. Inderal 10 mg p.o. daily p.r.n. anxiety.  12. Phenergan 25 mg take 1/2 tablet p.o. t.i.d. p.r.n. nausea.  (She recently stopped Fosamax 70 mg once weekly.)   INITIAL PHYSICAL  EXAMINATION:  VITAL SIGNS:  Blood pressure 94/72, pulse 72,  respirations 20, temperature 98.1, weight 129 pounds.  GENERAL:  She is a thin elderly white female who was tearful about her  persistent diarrhea and fatigue.  HEENT:  Significant for mild rhinitis.  Throat was normal.  NECK:  Supple without jugular venous distension or carotid bruit.  CHEST:  Clear to auscultation.  HEART:  Regular rate and rhythm without significant murmur or gallop.  ABDOMEN:  Normal bowel sounds and no hepatosplenomegaly or tenderness.  EXTREMITIES:  Without cyanosis, clubbing, or edema.  NEUROLOGIC:  She was alert and well-oriented and tearful without suicidal  ideation.  She could move all four extremities well.  She was somewhat  lightheaded with walking and needed to hold onto furniture to balance  herself.   LABORATORY DATA:  The most recent laboratory tests include January 03, 2006  serum sodium 140, potassium 3.7, BUN 11, creatinine 0.7, glucose  85.  Liver  associated enzymes normal.  Lipase 20, amylase 30, white blood cell count  5.6, hemoglobin 12, hematocrit 35, platelets 53.   HOSPITAL COURSE:  The patient was admitted to a medical bed without  telemetry.  She was started on IV fluids and had laboratory tests sent to  rule out the possibility of malabsorption.  She continued to have nausea  with substernal chest pain and continued diarrhea.  She developed fever.  She was empirically started on Avelox for possible pneumonia pending results  of labs.  She was seen by a GI consultant and a cardiology consultant.  An  EKG showed sinus tachycardia with nonspecific ST-segment and T-wave  abnormalities.  A transthoracic echocardiogram showed normal left  ventricular systolic function and mild aortic valve sclerosis.  Stool  specimens returned positive for C difficile toxin, and she was started on  Flagyl.  Further evaluation of her chest pain included thoracic spine x-rays  that showed a new  compression fracture at approximately the T7 level.  Other  procedures included a CT scan of the abdomen and pelvis for weight loss and  diarrhea that showed a left lower pole renal calculus without evidence of  urinary tract obstruction and was otherwise unremarkable.  A chest x-ray  showed changes of COPD with left base atelectasis.  January 19, 2006 thoracic  spine x-ray showed a T7 level compression fracture that was new in  comparison with old films.  January 20, 2006 thoracic spine MRI scan showed  subacute T6 compression fracture with 60% anterior loss of vertebral body  height and T5/6 anterolisthesis and there was exaggerated kyphosis at this  level.  The T 5/6 disk demonstrated a left paracentral disk protrusion that  contacts and flattens the left side of the spinal cord.  Shallow central  protrusions were evident at the T1/2, T2/3, and T3/4 levels without  significant stenosis at any of these levels.  No other significant disk  disease was present at any level.  The neural foramina were patent  bilaterally.  Hemangiomas were present at T7 and T8, as well as along the  superior endplate of T11 and the inferior endplate of T12.  Of note, a  December 06, 2004 barium swallow study had shown a hiatal hernia with mild  gastroesophageal reflux and no evidence of esophageal obstruction.   CONDITION ON DISCHARGE:  She has mild mid back pain.  She has been eating  well with no nausea or vomiting or diarrhea.  Her most recent vital signs  include blood pressure 122/73, pulse 93, respirations 18, temperature 98,  pulse oxygen saturation 93% on room air.  Most recent labs include white  blood cell count 5.8, hemoglobin 10, hematocrit 32, platelets 216, serum  sodium 143, potassium 4.5, chloride 105, carbon dioxide 30, BUN 5,  creatinine 0.7, glucose 95, total protein 4.9, albumin 2.5, BNP less than  30.  Stool fecal fat was normal.  Giardia was negative and neutral fat was normal.   Cryptosporidium was normal.   DISCHARGE DIAGNOSES:  1.  Clostridium difficile colitis.  2.  Dehydration.  3.  Moderately serum malnutrition.  4.  Anemia.  5.  Severe osteoporosis.  6.  Subacute T6 compression fracture.  7.  Depression.  8.  Vitamin D deficiency.  9.  Presumed iatrogenic adrenal insufficiency.  10. Anxiety.  11. Constipation.  12. Rheumatoid arthritis.  13. Osteoarthritis of the lumbar spine.  14. Recurrent pancreatitis without current evidence of malabsorption.  15. Gastroesophageal  reflux disease and hiatal hernia.  16. Rheumatoid arthritis.   DISCHARGE MEDICATIONS:  1.  Flagyl 500 mg p.o. t.i.d. for 10 days.  2.  Os-Cal D 500 one tablet p.o. t.i.d.  3.  Remeron 30 mg p.o. q.h.s.  4.  Prednisone 5 mg p.o. daily.  5.  Folate 1 mg p.o. daily.  6.  Xanax 0.5 mg p.o. b.i.d. and 1.0 mg p.o. q.h.s., 0.5 mg tablets, #120,      refill one.  7.  Senokot-S 1 tablet p.o. daily p.r.n. constipation.  8.  MiraLax 17 grams p.o. daily p.r.n. constipation.  9.  Arava 20 mg p.o. daily.  10. Methotrexate 10 mg p.o. weekly.  11. Ultram 50 mg p.o. t.i.d. p.r.n. mild pain.  12. Vicodin 5/500 1 tablet p.o. t.i.d. p.r.n. moderate pain, #90.  (Note that she is advised to stop amitriptyline and fluoxetine.)   FOLLOW-UP PLANS:  She was advised to schedule a follow-up appointment with  Dr. Jarome Matin at [facility] in approximately 2 weeks  following discharge.  She was advised to call [phone] to schedule that  appointment.   SPECIAL INSTRUCTIONS:  She was advised to eat a significant amount of  protein-containing foods such as chicken, meat, and fish.  She was also  advised to wear her spine brace whenever she is sitting up or standing and  take it off at night.  She was advised to call our office if she develops  recurrent diarrhea, fever, or worsening back or arm pain or weakness.           ______________________________  Barry Dienes Eloise Harman,  M.D.     DGP/MEDQ  D:  01/21/2006  T:  01/21/2006  Job:  [id number]   cc:   Fayrene Fearing L. Malon Kindle., M.D.  Fax: [phone]   Elmore Guise., M.D.  Fax: [phone]   Kathryne Hitch, MD  Fax: [phone]

## 2011-03-08 NOTE — Discharge Summary (Signed)
NAME:  [name], [name]                         ACCOUNT NO.:  [id number]   MEDICAL RECORD NO.:  [id number]                   PATIENT TYPE:  INP   LOCATION:  0378                                 FACILITY:  [facility]   PHYSICIAN:  Barry Dienes. Eloise Harman, M.D.            DATE OF BIRTH:  12-16-1934   DATE OF ADMISSION:  10/19/2002  DATE OF DISCHARGE:  11/03/2002                                 DISCHARGE SUMMARY   PERTINENT FINDINGS:  The patient is a 76 year old white female who at  approximately 0900 hours on the day of admission developed a fairly sudden  onset of severe (greater than 10/10 intensity) epigastric pain associated  with nausea and vomiting x1. She presented to the [facility]  for evaluation. On December 10 at her last office visit, she had milder  epigastric pain and her serum amylase level at that time was 37 with normal  liver associated transaminases. With this episode, her symptoms gradually  resolved. She denies a history of alcohol abuse and has no history of  hyperlipidemia. She has had four episodes of pancreatitis in the past. In  the 1990s an ERCP was attempted, but was unsuccessful due to multiple  diverticula in the region of the pancreatic duct. A magnetic resonance  cholangiopancreatogram was done in the year 2000 that reported was within  normal limits.   She also has a history of atypical chest pain with a 1998 cardiac  catheterization that was normal and symptoms felt due to gastroesophageal  reflux disease. She has a history of anxiety and depression, rheumatoid  arthritis, cervical lumbar spine degenerative disk disease, osteoporosis,  and a PENICILLIN allergy.   MEDICATIONS PRIOR TO ADMISSION:  1. Prednisone 4 mg p.o. daily.  2. Celebrex 200 mg p.o. daily.  3. Elavil 50 mg p.o. q.h.s.  4. Prozac 20 mg p.o. daily.  5. Folate 1 mg p.o. daily.  6. Methotrexate one dose p.o. once weekly.  7. Xanax 0.25 mg p.o. t.i.d. p.r.n. anxiety.  8.  Fosamax 70 mg p.o. once weekly.   INITIAL PHYSICAL EXAM:  VITAL SIGNS:  Temperature 100.90F, blood pressure  81/53, pulse 117, respiratory rate 18.  GENERAL:  She is a well-nourished white female who had moderate epigastric  pain causing her to move about her examination gurney.  HEENT:  Within normal limits.  NECK:  Without jugular venous distention or carotid bruit.  CHEST:  Clear to auscultation.  HEART:  Regular rate and rhythm. S1 and S2 were present without murmur,  gallop or rub.  ABDOMEN:  Moderately decreased bowel sounds with moderate epigastric  tenderness and no rebound.  NEUROLOGIC:  She was alert and oriented x3 with no focal neurologic  deficits.   LABORATORY DATA:  An acute abdomen series showed a normal chest x-ray and  abdomen x-rays without evidence of ileus or obstruction and suggesting a  possible small left renal stone. Urinalysis was nitrite  positive with 21-50  white blood cells and many bacteria. Alkaline phosphatase was 62, SGOT 15,  total bilirubin 0.9, serum amylase 2017, serum lipase 2365, white blood cell  count 12.3, hemoglobin 12.9, hematocrit 38.2, platelet count 217. Serum  sodium 138, potassium 3.6, chloride 103, CO2 27, BUN 13, creatinine 0.7,  glucose 127.   HOSPITAL COURSE:  The patient was admitted to a medical bed without  telemetry. She was treated with bowel rest, IV fluids, and narcotic pain  medicines as needed. In addition, her urinary tract infection was treated  with Tequin with rapid defervescence. Due to her chronic prednisone  treatment, she was treated with stress dose corticosteroids with resultant  elevation in her blood glucose levels and white blood cell count. Her  symptoms initially improved and then worsened with associated increased  serum amylase and lipase levels so a PICC line was inserted and TPN was  started. With a more gradual restarting of her p.o. food intake, she  remained without epigastric pain. On the day prior  to discharge, she was  able to eat a full low fat meal without any epigastric pain. She was  followed closely by a GI consultant who recommended a MRCP that was  significant for showing no evidence of common bile duct obstruction, but did  show pancreas divisum with a dilated and tortuous pancreatic duct and a main  pancreatic duct that emptied to the minor papilla with a minor pancreatic  duct emptying to the major papilla.   By the day of discharge, she was without any abdominal pain. She had resumed  normal bowel function and was without constipation and eating well.  Laboratory tests on the day of discharge were significant for white blood  cell count of 18,000 with a serum amylase of 72, serum lipase 25, prealbumin  level 21.5, triglycerides 72, SGOT 15, SGPT 14, albumin 3.2. On the day of  discharge, she was afebrile and had normal vital signs. Her blood glucose  levels were in the 140 to 220 range.   PROCEDURES:  1. December 30 abdomen x-rays showed no acute cardiopulmonary or abdominal     process with a possible left renal stone.  2. December 31 abdomen ultrasound showed an ill defined pancreatic border     secondary to inflammation with a common bile duct diameter of 6-7 mm and     no evidence of liver or pancreatic mass or common bile duct stone.  3. January 6 MRCP showed:  1)  Mild intrahepatic duct prominence. 2) Small     bilateral pleural effusions. 3) An enlarged pancreas with inflammatory     changes. 4) No evidence of common bile duct stone and absence of the     gallbladder. 5) Dilated and tortuous pancreatic duct with pancreas     divisum and main pancreatic duct emptying into the minor papilla and the     minor pancreatic duct emptying into the major papilla.  4. January 7 she had ultrasound guided insertion of a PICC line into the     right arm.   COMPLICATIONS:  None.   CONDITION ON DISCHARGE:  She feels fine. She has no abdominal pain and is tolerating a low  fat diet without difficulty. Of note, she has been  instructed by a nutritionist on proper low fat diet and she has been  instructed by the nursing staff on use of a glucose monitor and  administration of insulin.   DISCHARGE DIAGNOSES:  1. Acute pancreatitis.  2. Pancreas divisum.  3. Diabetes mellitus, type 2.  4. Acute pyelonephritis.  5. Rheumatoid arthritis.  6. Osteoporosis.  7. Depression.  8. Anxiety.  9. 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  10.      Gastroesophageal reflux disease.   DISCHARGE MEDICATIONS:  1. Viokase 16 caps, 4 caps p.o. with each meal.  2. Prednisone 5 mg tablets, 2 tablets p.o. once daily for 3 days and then 1     tablet daily.  3. Lantus insulin 14 units subcutaneous q.a.m.  4. Celebrex 200 mg p.o. once daily.  5. Elavil 50 mg p.o. q.h.s.  6. Prozac 20 mg p.o. once daily.  7. Folate 1 mg p.o. once daily.  8. Methotrexate one dose once weekly.  9. Xanax 0.25 mg p.o. t.i.d. p.r.n. anxiety.  10.      Fosamax 70 mg p.o. once weekly.  11.      Protonix 40 mg p.o. once daily for 14 days.  12.      Senokot S two tablets p.o. daily to prevent constipation.  13.      Milk of magnesia as needed for constipation.  14.      Vicodin 5/500 1-2 tablets p.o. t.i.d. p.r.n. pain #60, refill one.   DIET:  She should continue a low fat diet as described by the hospital  nutritionist.   SPECIAL INSTRUCTIONS:  She should check her blood glucose levels twice daily  and call our office with the results every 2-3 days.   FOLLOW UP:  She should be seen within one week by Dr. Eloise Harman at [facility] and will call [phone] to schedule that appointment. She  should be seen by Dr. Carman Ching in his office in 3-4 weeks following  discharge and was given the telephone number to schedule that appointment.                                               Barry Dienes Eloise Harman, M.D.    DGP/MEDQ  D:  11/03/2002  T:  11/03/2002  Job:  [id number]    cc:   Fayrene Fearing L. Malon Kindle., M.D.  [address]  [place]  [place] [address]  Fax: [phone]   Thereasa Solo. Little, M.D.  [address]  [place] [address]  Fax: [phone]   Lucrezia Starch. Ovidio Hanger, M.D.  [address]  [place]  [place] [address]  Fax: [phone]   Lemmie Evens, M.D.  [address]  [place]  [place] [address]  Fax: [phone]

## 2011-03-08 NOTE — H&P (Signed)
[name], [name] NO.:  [id number]   MEDICAL RECORD NO.:  [id number]          PATIENT TYPE:  INP   LOCATION:  4705                         FACILITY:  [facility]   PHYSICIAN:  [name]. [name], M.D.DATE OF BIRTH:  [birth year]   DATE OF ADMISSION:  01/15/2006  DATE OF DISCHARGE:                                HISTORY & PHYSICAL   CHIEF COMPLAINT:  Fatigue, gait unsteadiness, and persistent diarrhea.   HISTORY OF PRESENT ILLNESS:  Patient is a 76 year old white female with an  approximately one-month history of diarrhea following most meals associated  with fatigue and vague epigastric pain.  For the past week she has been  sleepy much of the time and somewhat unsteady on her feet.  She has not had  any significant falls.  All foods seem to go right through her.  She  denies fever, chills, recent antibiotics, blood in her bowel movements,  nausea, or vomiting.   PAST MEDICAL HISTORY:  1.  Recurrent pancreatitis in 1991, 199306-01-1997, and 03/24/02 with an attempted      ERCP unsuccessful in the past.  2.  Atypical chest pain with 1998 cardiac catheterization unremarkable.  3.  Gastroesophageal reflux disease.  4.  Anxiety.  5.  Depression.  6.  Chronic low back pain.  7.  Bilateral hand pain and right wrist edema.  8.  Rheumatoid arthritis.  9.  Osteoporosis with vitamin D deficiency.   MEDICATIONS PRIOR TO ADMISSION:  1.  Prednisone 5 mg p.o. daily.  2.  Folate 1 mg p.o. daily.  3.  Xanax 0.5 mg p.o. b.i.d. and 1 mg p.o. q.h.s.  4.  Senokot S one tablet p.o. daily p.r.n. constipation.  5.  Milk of Magnesia 30 mL p.o. daily p.r.n. constipation.  6.  Arava 20 mg p.o. daily.  7.  Amitriptyline 50 mg p.o. q.h.s.  8.  Fluoxetine 20 mg p.o. daily.  9.  Methotrexate 10 mg p.o. weekly.  10. Ultram 50 mg p.o. t.i.d. p.r.n. pain.  11. Inderal 10 mg p.o. daily p.r.n. anxiety.  12. Phenergan 25 mg take one-half tablet p.o. t.i.d. p.r.n. nausea.  13. She has recently  stopped Fosamax 70 mg once weekly.   PAST SURGICAL HISTORY:  1.  1960s open cholecystectomy.  2.  A remote partial hysterectomy.  3.  1970 left knee arthroscopy.  4.  1996 bunionectomy.  5.  1996 left knee surgery.  6.  1999 left rotator cuff surgery.  7.  Total abdominal hysterectomy and bilateral salpingo-oophorectomy.   ALLERGIES:  PENICILLIN has been associated with a rash.   SOCIAL HISTORY:  She has been a widow since 03-24-98 when her husband died while  he was out hunting.  She is retired.  She has no history of tobacco or  alcohol abuse.   FAMILY HISTORY:  Her father died in his 44s of colon cancer and diabetes  mellitus.  Her mother died at age 41.  She had a sister who died at age 16  of bone cancer and a brother who has epilepsy and two sisters, one of which  has  diabetes mellitus.  She has one son and two daughters.  There is a  family history of close relatives with diabetes mellitus and colon cancer,  but none with breast cancer or early coronary artery disease.   REVIEW OF SYSTEMS:  She has lost 10 pounds since January 02, 2006.  She has  not had recent fever.  She has some rhinitis.  She has not had substernal  chest pain.  She has had a dry cough, but no shortness of breath.  She has  had significant diarrhea after every meal, but no bright red blood per  rectum and no nausea or vomiting.  She denies dysuria or frequency.  She  denies headaches.  She has mild anxiety and mild depression.  She has not  had suicidal ideation.   PHYSICAL EXAMINATION:  VITAL SIGNS:  Blood pressure 94/72, pulse 72,  respirations 20, temperature 98.1, weight 129 pounds.  GENERAL:  She is a thin, elderly white female who was tearful about her  persistent diarrhea and fatigue.  HEENT:  Significant for rhinitis bilaterally.  Throat was normal.  NECK:  Supple and without jugular venous distension or carotid bruit.  CHEST:  Clear to auscultation.  HEART:  Regular rate and rhythm without  significant murmur or gallop.  ABDOMEN:  Normal bowel sounds and no hepatosplenomegaly or tenderness.  EXTREMITIES:  Without cyanosis, clubbing, or edema.  NEUROLOGIC:  She is alert and well-oriented and tearful without suicidal  ideation.  She moves all extremities reasonably well.  With gait assessment  she was able to change from a sitting position to a standing position but  walked with a wide-based gait and balanced on furniture to prevent falls.   MOST RECENT LABORATORY TESTS:  January 03, 2006:  Serum sodium 140, potassium  3.7, BUN 11, creatinine 0.7, glucose 85.  Liver-associated enzymes normal.  Lipase 20, amylase 30.  White blood cell count 5.6, hemoglobin 12,  hematocrit 35, platelets 53.   IMPRESSION AND PLAN:  1.  Diarrhea:  This is most likely secondary to pancreatic insufficiency      from her history of pancreatitis in the past.  It is also possible that      medications could be contributing to her diarrhea.  I plan to restart      pancreatic enzyme replacement at three caps p.o. t.i.d. a.c.  In      addition, will hold her fluoxetine and start Remeron 30 mg p.o. q.h.s.      We will also check a stool specimen for Clostridium difficile, although      this seems unlikely given no recent antibiotic use.  2.  Lightheadedness and gait instability:  This is most likely due to severe      volume depletion from persistent diarrhea with poor food and fluid      intake.  She will be given high-dose intravenous fluids and her gait      will be reassessed when she is euvolemic.  3.  Weight loss:  Most likely due to diarrhea and poor food and fluid      intake.  We will obtain a CT scan of the abdomen and pelvis to ensure      that there is no significant intra-abdominal pathology such as an occult      neoplasm causing weight loss.  4.  Depression:  Moderate symptoms despite fluoxetine treatment.  We will     discontinue fluoxetine and start Remeron which should not have any side  effects related to the GI system such as diarrhea and may help her to      sleep better.  5.  Rheumatoid arthritis:  Moderately severe.  Given that Arava could cause      thrombocytopenia this is currently being held and we will recheck a CBC.      In addition, she will receive continued dosing of prednisone as she may      have iatrogenic adrenal insufficiency from longstanding prednisone      treatment.           ______________________________  [name]. [name], M.D.     DGP/MEDQ  D:  01/16/2006  T:  01/17/2006  Job:  [id number]   cc:   Fayrene Fearing L. Malon Kindle., M.D.  Fax: [phone]   Judeth Cornfield, M.D.   Lemmie Evens, M.D.  Fax: [phone]

## 2011-03-08 NOTE — Op Note (Signed)
   NAME:  [name], [name]                         ACCOUNT NO.:  [id number]   MEDICAL RECORD NO.:  [id number]                   PATIENT TYPE:  AMB   LOCATION:  ENDO                                 FACILITY:  [facility]   PHYSICIAN:  James L. Malon Kindle., M.D.          DATE OF BIRTH:  08-13-1935   DATE OF PROCEDURE:  10/05/2002  DATE OF DISCHARGE:                                 OPERATIVE REPORT   PROCEDURE:  Colonoscopy with coagulation of polyp.   MEDICATIONS:  Fentanyl 100 mcg, Versed 9 mg IV.   INDICATIONS:  Strong family history of colon cancer.  This is done as a  routine five-year follow-up.   DESCRIPTION OF PROCEDURE:  The procedure had been explained to the patient  and consent obtained.  The pediatric Olympus adjustable scope was used.  The  scope was inserted and advanced.  The patient had marked diverticulosis.  Eventually we were able to pass this and advance to the cecum.  The patient  had a long, tortuous colon.  We finally had to get her in the right lateral  position to advance down into the cecum. Right on the ileocecal valve was a  3-4 mm sessile polyp, and this was cauterized with the hot biopsy forceps.  This was placed in a jar.  We were able to remove the scope, and the  ascending, transverse, descending, and sigmoid colon were seen well with  marked diverticular disease in the sigmoid, but no polyps.  The rectum was  free of polyps.  The scope was withdrawn.  The patient tolerated the  procedure well.   ASSESSMENT:  Cecal polyp, cauterized.   PLAN:  Will check pathology and will recommend repeating in three years.                                                James L. Malon Kindle., M.D.    Waldron Session  D:  10/05/2002  T:  10/05/2002  Job:  [id number]

## 2011-03-08 NOTE — Consult Note (Signed)
NAMEOCEANIA, [name] NO.:  [id number]   MEDICAL RECORD NO.:  [id number]          PATIENT TYPE:  INP   LOCATION:  4705                         FACILITY:  [facility]   PHYSICIAN:  Shirley Friar, MDDATE OF BIRTH:  1934/11/21   DATE OF CONSULTATION:  01/16/2006  DATE OF DISCHARGE:                                   CONSULTATION   REASON FOR CONSULTATION:  Diarrhea.   HISTORY OF PRESENT ILLNESS:  Ms. Perleberg is a 76 year old white female with a  history of recurrent pancreatitis (idiopathic).  She was admitted on January 15, 2006, secondary to a one-month history of nausea, vomiting and diarrhea.  During her admission she has undergone an abdominal CAT scan which has been  negative for an acute process.  On my history, she reports having two weeks  of watery diarrhea described as three to four times a day with weakness.  She had one episode of vomiting, but otherwise was without any vomiting.  She did have nausea daily.  Prior to the onset of diarrhea at home she had a  sharp abdominal pain across her abdomen which is very uncommon for her.  She  reports having light-colored to tan-colored stool, prior to the onset of  diarrhea.  Currently her diarrhea has decreased to two times a day and she  was recently started on Viokase six pills t.i.d. with meals, yesterday.  She  denies any recent antibiotics and denies any recent changes in her other  medicines prior to admission.  On December 23, 2005, the patient underwent a  colonoscopy by Dr. Fayrene Fearing L. Edwards, secondary to a personal history of  polyps and a family history of colon cancer.  This colonoscopy was negative  for polyps.  It did show sigmoid diverticulosis.  The patient denies any  alcohol intake and denies any family history of pancreatic diseases.  She  has had a cholecystectomy in the past.  Currently she is without complaints.   PAST MEDICAL/SURGICAL HISTORY:  1.  Recurrent idiopathic pancreatitis.  2.   History of anxiety.  3.  History of depression.  4.  Osteoporosis.  5.  Rheumatoid arthritis.  6.  Status post cholecystectomy.  7.  Status post partial hysterectomy.  8.  Status post knee surgery.   MEDICATIONS ON ADMISSION:  1.  Prednisone.  2.  Arava.  3.  Folic acid.  4.  Prevacid.  5.  Methotrexate.   REVIEW OF SYSTEMS:  Negative except as above.   PHYSICAL EXAMINATION:  VITAL SIGNS:  T-Max 101.5 degrees, pulse 113, blood  pressure 126/79.  GENERAL:  Alert, in no acute distress.  HEENT:  Anicteric sclerae.  CHEST:  Clear to auscultation bilaterally.  CARDIOVASCULAR:  A regular rate and rhythm without murmurs.  ABDOMEN:  Soft, nontender, non-distended.  Active bowel sounds.  EXTREMITIES:  No edema.   LABORATORY DATA:  CBC:  White blood cell count 8.3, hemoglobin 10.4,  platelet count 172.  Sodium 141, potassium 3.5, chloride 108, CO2 of 27, BUN  3, creatinine 0.8, glucose 98.   IMPRESSION:  A 76 year old white female with  a two to four-week history of  watery diarrhea, in the setting of recurrent pancreatitis.  The patient  apparently does not have any signs of pancreatitis, but her diarrhea  suggests pancreatic insufficiency as the cause.  She could have Clostridium  difficile colitis as a source, based on her age and medical history.  This  may need to be further evaluated I suspect,  with the improvement of her  diarrhea, since starting the Viokase, as well as based on her history of her  diarrhea, consistent with pancreatic insufficiency.  Her loose bowel were  occurring after meals.  She had significant weakness.  Besides pancreatic  insufficiency, other differentials include infectious colitis such as  Clostridium difficile colitis, versus opportunistic infections on  methotrexate and prednisone, versus medication-induced diarrhea, versus  ischemic colitis.   PLAN:  I agree with stool studies to check C. difficile toxin.  Would also  check stool for fecal white  blood cells, O&P and fecal fat.  Will continue  Viokase and continue aggressive IV fluids.  Will follow up on stool studies  and note further improvement in the diarrhea on pancreatic enzymes.      Shirley Friar, MD  Electronically Signed     VCS/MEDQ  D:  01/17/2006  T:  01/17/2006  Job:  [id number]   cc:   Fayrene Fearing L. Malon Kindle., M.D.  Fax: [phone]   Barry Dienes. Eloise Harman, M.D.  Fax: [phone]

## 2011-04-17 ENCOUNTER — Emergency Department (HOSPITAL_COMMUNITY)
Admission: EM | Admit: 2011-04-17 | Discharge: 2011-04-17 | Disposition: A | Discharge status: Home / Self Care | Payer: Medicare Other | Attending: Emergency Medicine | Admitting: Emergency Medicine

## 2011-04-17 ENCOUNTER — Emergency Department (HOSPITAL_COMMUNITY): Payer: Medicare Other

## 2011-04-17 DIAGNOSIS — M069 Rheumatoid arthritis, unspecified: Secondary | ICD-10-CM | POA: Insufficient documentation

## 2011-04-17 DIAGNOSIS — M25539 Pain in unspecified wrist: Secondary | ICD-10-CM | POA: Insufficient documentation

## 2011-05-02 ENCOUNTER — Other Ambulatory Visit (HOSPITAL_BASED_OUTPATIENT_CLINIC_OR_DEPARTMENT_OTHER): Payer: Self-pay | Admitting: Internal Medicine

## 2011-05-02 DIAGNOSIS — Z1231 Encounter for screening mammogram for malignant neoplasm of breast: Secondary | ICD-10-CM

## 2011-05-13 ENCOUNTER — Ambulatory Visit
Admission: RE | Admit: 2011-05-13 | Discharge: 2011-05-13 | Disposition: A | Discharge status: Home / Self Care | Payer: Medicare Other | Source: Ambulatory Visit | Attending: Internal Medicine | Admitting: Internal Medicine

## 2011-05-13 DIAGNOSIS — Z1231 Encounter for screening mammogram for malignant neoplasm of breast: Secondary | ICD-10-CM

## 2011-07-19 LAB — CBC
Hemoglobin: 10.5 — ABNORMAL LOW
Hemoglobin: 10.6 — ABNORMAL LOW
Hemoglobin: 13.1
MCHC: 33.8
MCV: 95.6
Platelets: 238
RBC: 3.24 — ABNORMAL LOW
RBC: 3.3 — ABNORMAL LOW
RBC: 3.3 — ABNORMAL LOW
RDW: 14.1
RDW: 14.6
WBC: 12.8 — ABNORMAL HIGH
WBC: 13.3 — ABNORMAL HIGH

## 2011-07-19 LAB — DIFFERENTIAL
Basophils Absolute: 0
Lymphocytes Relative: 12
Neutro Abs: 9.8 — ABNORMAL HIGH
Neutrophils Relative %: 84 — ABNORMAL HIGH

## 2011-07-19 LAB — PROTIME-INR
INR: 1
INR: 1
INR: 2 — ABNORMAL HIGH
INR: 2.3 — ABNORMAL HIGH
INR: 2.6 — ABNORMAL HIGH
Prothrombin Time: 13.1
Prothrombin Time: 13.4
Prothrombin Time: 19.6 — ABNORMAL HIGH
Prothrombin Time: 26.4 — ABNORMAL HIGH
Prothrombin Time: 29.5 — ABNORMAL HIGH

## 2011-07-19 LAB — BASIC METABOLIC PANEL
Calcium: 8.2 — ABNORMAL LOW
Chloride: 101
Chloride: 106
Creatinine, Ser: 0.6
Creatinine, Ser: 0.66
GFR calc Af Amer: >60
GFR calc Af Amer: >60
GFR calc Af Amer: >60
GFR calc non Af Amer: >60
GFR calc non Af Amer: >60
Potassium: 4.4
Sodium: 137
Sodium: 141

## 2011-07-19 LAB — SAMPLE TO BLOOD BANK

## 2011-09-16 ENCOUNTER — Emergency Department (HOSPITAL_COMMUNITY): Payer: No Typology Code available for payment source

## 2011-09-16 ENCOUNTER — Emergency Department (HOSPITAL_COMMUNITY)
Admission: EM | Admit: 2011-09-16 | Discharge: 2011-09-16 | Disposition: A | Discharge status: Home / Self Care | Payer: No Typology Code available for payment source | Attending: Emergency Medicine | Admitting: Emergency Medicine

## 2011-09-16 ENCOUNTER — Encounter: Payer: Self-pay | Admitting: *Deleted

## 2011-09-16 DIAGNOSIS — Z79899 Other long term (current) drug therapy: Secondary | ICD-10-CM | POA: Insufficient documentation

## 2011-09-16 DIAGNOSIS — S39012A Strain of muscle, fascia and tendon of lower back, initial encounter: Secondary | ICD-10-CM

## 2011-09-16 DIAGNOSIS — M069 Rheumatoid arthritis, unspecified: Secondary | ICD-10-CM | POA: Insufficient documentation

## 2011-09-16 DIAGNOSIS — M810 Age-related osteoporosis without current pathological fracture: Secondary | ICD-10-CM | POA: Insufficient documentation

## 2011-09-16 DIAGNOSIS — M545 Low back pain, unspecified: Secondary | ICD-10-CM | POA: Insufficient documentation

## 2011-09-16 DIAGNOSIS — IMO0001 Reserved for inherently not codable concepts without codable children: Secondary | ICD-10-CM | POA: Insufficient documentation

## 2011-09-16 DIAGNOSIS — M542 Cervicalgia: Secondary | ICD-10-CM | POA: Insufficient documentation

## 2011-09-16 DIAGNOSIS — S335XXA Sprain of ligaments of lumbar spine, initial encounter: Secondary | ICD-10-CM | POA: Insufficient documentation

## 2011-09-16 HISTORY — DX: Rheumatoid arthritis, unspecified: M06.9

## 2011-09-16 HISTORY — DX: Cardiac murmur, unspecified: R01.1

## 2011-09-16 LAB — URINE MICROSCOPIC-ADD ON

## 2011-09-16 LAB — URINALYSIS, ROUTINE W REFLEX MICROSCOPIC
Glucose, UA: NEGATIVE mg/dL
Ketones, ur: NEGATIVE mg/dL
Nitrite: NEGATIVE
Specific Gravity, Urine: 1.011 (ref 1.005–1.030)
pH: 7 (ref 5.0–8.0)

## 2011-09-16 LAB — COMPREHENSIVE METABOLIC PANEL
Albumin: 3.9 g/dL (ref 3.5–5.2)
BUN: 15 mg/dL (ref 6–23)
Calcium: 10.1 mg/dL (ref 8.4–10.5)
Creatinine, Ser: 0.67 mg/dL (ref 0.50–1.10)
GFR calc Af Amer: >90 mL/min (ref >90)
Glucose, Bld: 90 mg/dL (ref 70–99)
Potassium: 4.2 mEq/L (ref 3.5–5.1)
Total Protein: 6.6 g/dL (ref 6.0–8.3)

## 2011-09-16 LAB — CBC
HCT: 37.9 % (ref 36.0–46.0)
Hemoglobin: 12.7 g/dL (ref 12.0–15.0)
MCV: 104.7 fL — ABNORMAL HIGH (ref 78.0–100.0)
RBC: 3.62 MIL/uL — ABNORMAL LOW (ref 3.87–5.11)
WBC: 10.4 10*3/uL (ref 4.0–10.5)

## 2011-09-16 LAB — PROTIME-INR: INR: 0.87 (ref 0.00–1.49)

## 2011-09-16 LAB — POCT I-STAT, CHEM 8
BUN: 16 mg/dL (ref 6–23)
Calcium, Ion: 1.19 mmol/L (ref 1.12–1.32)
Chloride: 101 mEq/L (ref 96–112)
HCT: 39 % (ref 36.0–46.0)
Potassium: 3.8 mEq/L (ref 3.5–5.1)

## 2011-09-16 MED ORDER — IOHEXOL 300 MG/ML  SOLN
100.0000 mL | Freq: Once | INTRAMUSCULAR | Status: AC | PRN
  Administered 2011-09-16: 100 mL via INTRAVENOUS

## 2011-09-16 MED ORDER — MORPHINE SULFATE 4 MG/ML IJ SOLN
4.0000 mg | Freq: Once | INTRAMUSCULAR | Status: AC
  Administered 2011-09-16: 4 mg via INTRAVENOUS
  Filled 2011-09-16: qty 1

## 2011-09-16 MED ORDER — ONDANSETRON HCL 4 MG/2ML IJ SOLN
4.0000 mg | Freq: Once | INTRAMUSCULAR | Status: AC
  Administered 2011-09-16: 4 mg via INTRAVENOUS
  Filled 2011-09-16: qty 2

## 2011-09-16 MED ORDER — OXYCODONE-ACETAMINOPHEN 5-325 MG PO TABS: 1.0000 | ORAL_TABLET | ORAL | Status: AC | PRN

## 2011-09-16 NOTE — ED Notes (Signed)
Family updated as to patient's status.
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
## 2011-09-16 NOTE — ED Notes (Signed)
Pt to be discharged home. No further questions regarding follow up. Teaching performed regarding use of incentive spirometer. Family at bedside to take patient home.

## 2011-09-16 NOTE — ED Notes (Signed)
Pt ambulated with minimal assistance. States mild dizziness, but able to ambulate without significant difficulty. Vital signs stable. No signs of distress at the time. Resident at the bedside to explain plan of care.

## 2011-09-16 NOTE — ED Notes (Signed)
Pt resting quietly at the time. Remains on cardiac monitor. Vital signs stable. Family at bedside. Will attempt to ambulate patient after she gets off bedpan.
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
## 2011-09-16 NOTE — ED Notes (Signed)
Patient return from xray and states her head still hurts. Patient placed back on monitor and vital signs recorded. Granddaughter and daughter-in-law are at bedside.
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
## 2011-09-16 NOTE — ED Notes (Signed)
Pt resting quietly at the time. c-collar remains in place. Vital signs stable. Pt states 7/10 headache. She remains alert and oriented x4. No signs of distress at the present. Family at bedside.

## 2011-09-17 NOTE — ED Provider Notes (Signed)
I saw and evaluated the patient, reviewed the resident's note and I agree with the findings and plan.  Hurman Horn, MD 09/17/11 1409

## 2011-10-01 ENCOUNTER — Other Ambulatory Visit: Payer: Self-pay

## 2011-10-01 ENCOUNTER — Encounter (HOSPITAL_COMMUNITY): Payer: Self-pay | Admitting: Emergency Medicine

## 2011-10-01 ENCOUNTER — Inpatient Hospital Stay (HOSPITAL_COMMUNITY)
Admission: EM | Admit: 2011-10-01 | Discharge: 2011-10-14 | DRG: 689 | Disposition: A | Discharge status: Home / Self Care | Payer: Medicare Other | Attending: Internal Medicine | Admitting: Internal Medicine

## 2011-10-01 ENCOUNTER — Emergency Department (HOSPITAL_COMMUNITY): Payer: Medicare Other

## 2011-10-01 DIAGNOSIS — M545 Low back pain, unspecified: Secondary | ICD-10-CM | POA: Diagnosis present

## 2011-10-01 DIAGNOSIS — R059 Cough, unspecified: Secondary | ICD-10-CM | POA: Diagnosis present

## 2011-10-01 DIAGNOSIS — M810 Age-related osteoporosis without current pathological fracture: Secondary | ICD-10-CM | POA: Diagnosis present

## 2011-10-01 DIAGNOSIS — IMO0002 Reserved for concepts with insufficient information to code with codable children: Secondary | ICD-10-CM

## 2011-10-01 DIAGNOSIS — E119 Type 2 diabetes mellitus without complications: Secondary | ICD-10-CM | POA: Diagnosis present

## 2011-10-01 DIAGNOSIS — E876 Hypokalemia: Secondary | ICD-10-CM

## 2011-10-01 DIAGNOSIS — B9620 Unspecified Escherichia coli [E. coli] as the cause of diseases classified elsewhere: Secondary | ICD-10-CM | POA: Diagnosis present

## 2011-10-01 DIAGNOSIS — Z8673 Personal history of transient ischemic attack (TIA), and cerebral infarction without residual deficits: Secondary | ICD-10-CM

## 2011-10-01 DIAGNOSIS — I9589 Other hypotension: Secondary | ICD-10-CM | POA: Diagnosis not present

## 2011-10-01 DIAGNOSIS — F329 Major depressive disorder, single episode, unspecified: Secondary | ICD-10-CM | POA: Diagnosis present

## 2011-10-01 DIAGNOSIS — R05 Cough: Secondary | ICD-10-CM

## 2011-10-01 DIAGNOSIS — D72829 Elevated white blood cell count, unspecified: Secondary | ICD-10-CM | POA: Diagnosis present

## 2011-10-01 DIAGNOSIS — F3289 Other specified depressive episodes: Secondary | ICD-10-CM | POA: Diagnosis present

## 2011-10-01 DIAGNOSIS — E860 Dehydration: Secondary | ICD-10-CM | POA: Diagnosis present

## 2011-10-01 DIAGNOSIS — R627 Adult failure to thrive: Secondary | ICD-10-CM | POA: Diagnosis present

## 2011-10-01 DIAGNOSIS — R531 Weakness: Secondary | ICD-10-CM | POA: Diagnosis present

## 2011-10-01 DIAGNOSIS — R5381 Other malaise: Secondary | ICD-10-CM | POA: Diagnosis present

## 2011-10-01 DIAGNOSIS — R112 Nausea with vomiting, unspecified: Secondary | ICD-10-CM | POA: Diagnosis present

## 2011-10-01 DIAGNOSIS — R000 Tachycardia, unspecified: Secondary | ICD-10-CM | POA: Diagnosis present

## 2011-10-01 DIAGNOSIS — A498 Other bacterial infections of unspecified site: Secondary | ICD-10-CM | POA: Diagnosis present

## 2011-10-01 DIAGNOSIS — J189 Pneumonia, unspecified organism: Secondary | ICD-10-CM | POA: Diagnosis present

## 2011-10-01 DIAGNOSIS — M069 Rheumatoid arthritis, unspecified: Secondary | ICD-10-CM | POA: Diagnosis present

## 2011-10-01 DIAGNOSIS — E2749 Other adrenocortical insufficiency: Secondary | ICD-10-CM | POA: Diagnosis present

## 2011-10-01 DIAGNOSIS — N390 Urinary tract infection, site not specified: Principal | ICD-10-CM | POA: Diagnosis present

## 2011-10-01 DIAGNOSIS — R197 Diarrhea, unspecified: Secondary | ICD-10-CM | POA: Diagnosis present

## 2011-10-01 DIAGNOSIS — R5383 Other fatigue: Secondary | ICD-10-CM | POA: Diagnosis present

## 2011-10-01 DIAGNOSIS — R509 Fever, unspecified: Secondary | ICD-10-CM | POA: Diagnosis present

## 2011-10-01 DIAGNOSIS — K8689 Other specified diseases of pancreas: Secondary | ICD-10-CM | POA: Diagnosis present

## 2011-10-01 DIAGNOSIS — F411 Generalized anxiety disorder: Secondary | ICD-10-CM | POA: Diagnosis present

## 2011-10-01 LAB — URINALYSIS, ROUTINE W REFLEX MICROSCOPIC
Glucose, UA: NEGATIVE mg/dL
Protein, ur: 100 mg/dL — AB
pH: 7.5 (ref 5.0–8.0)

## 2011-10-01 LAB — COMPREHENSIVE METABOLIC PANEL
ALT: 10 U/L (ref 0–35)
AST: 15 U/L (ref 0–37)
Alkaline Phosphatase: 55 U/L (ref 39–117)
CO2: 30 mEq/L (ref 19–32)
Calcium: 9.1 mg/dL (ref 8.4–10.5)
GFR calc Af Amer: >90 mL/min (ref >90)
GFR calc non Af Amer: >90 mL/min (ref >90)
Glucose, Bld: 116 mg/dL — ABNORMAL HIGH (ref 70–99)
Potassium: 2.8 mEq/L — ABNORMAL LOW (ref 3.5–5.1)
Sodium: 138 mEq/L (ref 135–145)

## 2011-10-01 LAB — CBC
Platelets: 200 10*3/uL (ref 150–400)
RBC: 3.57 MIL/uL — ABNORMAL LOW (ref 3.87–5.11)
RDW: 14.5 % (ref 11.5–15.5)
WBC: 13.9 10*3/uL — ABNORMAL HIGH (ref 4.0–10.5)

## 2011-10-01 LAB — URINE MICROSCOPIC-ADD ON

## 2011-10-01 LAB — DIFFERENTIAL
Basophils Absolute: 0 10*3/uL (ref 0.0–0.1)
Eosinophils Relative: 0 % (ref 0–5)
Lymphocytes Relative: 8 % — ABNORMAL LOW (ref 12–46)
Lymphs Abs: 1.1 10*3/uL (ref 0.7–4.0)
Neutro Abs: 11.9 10*3/uL — ABNORMAL HIGH (ref 1.7–7.7)
Neutrophils Relative %: 86 % — ABNORMAL HIGH (ref 43–77)

## 2011-10-01 MED ORDER — ONDANSETRON HCL 4 MG/2ML IJ SOLN
INTRAMUSCULAR | Status: AC
  Filled 2011-10-01: qty 2

## 2011-10-01 MED ORDER — SODIUM CHLORIDE 0.9 % IV SOLN: INTRAVENOUS | Status: AC

## 2011-10-01 MED ORDER — LEVOFLOXACIN IN D5W 500 MG/100ML IV SOLN
500.0000 mg | INTRAVENOUS | Status: DC
  Administered 2011-10-01 – 2011-10-02 (×2): 500 mg via INTRAVENOUS
  Filled 2011-10-01 (×3): qty 100

## 2011-10-01 MED ORDER — AMITRIPTYLINE HCL 50 MG PO TABS
50.0000 mg | ORAL_TABLET | Freq: Every day | ORAL | Status: DC
  Administered 2011-10-01 – 2011-10-13 (×13): 50 mg via ORAL
  Filled 2011-10-01 (×14): qty 1

## 2011-10-01 MED ORDER — POTASSIUM CHLORIDE IN NACL 40-0.9 MEQ/L-% IV SOLN
INTRAVENOUS | Status: DC
  Administered 2011-10-01: 20:00:00 via INTRAVENOUS
  Filled 2011-10-01 (×2): qty 1000

## 2011-10-01 MED ORDER — METHYLPREDNISOLONE SODIUM SUCC 125 MG IJ SOLR
60.0000 mg | Freq: Two times a day (BID) | INTRAMUSCULAR | Status: DC
  Administered 2011-10-01 – 2011-10-05 (×8): 60 mg via INTRAVENOUS
  Filled 2011-10-01 (×10): qty 2

## 2011-10-01 MED ORDER — POLYVINYL ALCOHOL 1.4 % OP SOLN
1.0000 [drp] | OPHTHALMIC | Status: DC | PRN
  Filled 2011-10-01: qty 15

## 2011-10-01 MED ORDER — HYDROCOD POLST-CHLORPHEN POLST 10-8 MG/5ML PO LQCR
5.0000 mL | Freq: Once | ORAL | Status: AC
  Administered 2011-10-01: 5 mL via ORAL
  Filled 2011-10-01: qty 5

## 2011-10-01 MED ORDER — VITAMIN D (ERGOCALCIFEROL) 1.25 MG (50000 UNIT) PO CAPS
50000.0000 [IU] | ORAL_CAPSULE | ORAL | Status: DC
  Administered 2011-10-06 – 2011-10-13 (×2): 50000 [IU] via ORAL
  Filled 2011-10-01 (×2): qty 1

## 2011-10-01 MED ORDER — MIRTAZAPINE 30 MG PO TABS
30.0000 mg | ORAL_TABLET | Freq: Every day | ORAL | Status: DC
  Administered 2011-10-01 – 2011-10-13 (×13): 30 mg via ORAL
  Filled 2011-10-01 (×14): qty 1

## 2011-10-01 MED ORDER — ALUM & MAG HYDROXIDE-SIMETH 200-200-20 MG/5ML PO SUSP: 30.0000 mL | Freq: Four times a day (QID) | ORAL | Status: DC | PRN

## 2011-10-01 MED ORDER — POTASSIUM CHLORIDE 10 MEQ/100ML IV SOLN
10.0000 meq | Freq: Once | INTRAVENOUS | Status: AC
  Administered 2011-10-01: 10 meq via INTRAVENOUS
  Filled 2011-10-01: qty 100

## 2011-10-01 MED ORDER — ONDANSETRON HCL 4 MG/2ML IJ SOLN: 4.0000 mg | Freq: Three times a day (TID) | INTRAMUSCULAR | Status: DC | PRN

## 2011-10-01 MED ORDER — HYDROCODONE-ACETAMINOPHEN 5-325 MG PO TABS
1.0000 | ORAL_TABLET | Freq: Three times a day (TID) | ORAL | Status: DC | PRN
  Administered 2011-10-09 – 2011-10-13 (×5): 1 via ORAL
  Filled 2011-10-01 (×5): qty 1

## 2011-10-01 MED ORDER — ALBUTEROL SULFATE (5 MG/ML) 0.5% IN NEBU: 2.5000 mg | INHALATION_SOLUTION | RESPIRATORY_TRACT | Status: DC | PRN

## 2011-10-01 MED ORDER — BUPROPION HCL 75 MG PO TABS
75.0000 mg | ORAL_TABLET | Freq: Every day | ORAL | Status: DC
  Administered 2011-10-01 – 2011-10-14 (×14): 75 mg via ORAL
  Filled 2011-10-01 (×14): qty 1

## 2011-10-01 MED ORDER — ACETAMINOPHEN 325 MG PO TABS
650.0000 mg | ORAL_TABLET | Freq: Four times a day (QID) | ORAL | Status: DC | PRN
  Administered 2011-10-01 – 2011-10-07 (×4): 650 mg via ORAL
  Filled 2011-10-01 (×4): qty 2

## 2011-10-01 MED ORDER — ENOXAPARIN SODIUM 40 MG/0.4ML ~~LOC~~ SOLN: 40.0000 mg | SUBCUTANEOUS | Status: DC

## 2011-10-01 MED ORDER — ONDANSETRON HCL 4 MG/2ML IJ SOLN: 4.0000 mg | Freq: Four times a day (QID) | INTRAMUSCULAR | Status: DC | PRN

## 2011-10-01 MED ORDER — ENOXAPARIN SODIUM 40 MG/0.4ML ~~LOC~~ SOLN
40.0000 mg | SUBCUTANEOUS | Status: DC
  Administered 2011-10-01 – 2011-10-13 (×13): 40 mg via SUBCUTANEOUS
  Filled 2011-10-01 (×14): qty 0.4

## 2011-10-01 MED ORDER — ACETAMINOPHEN 650 MG RE SUPP: 650.0000 mg | Freq: Four times a day (QID) | RECTAL | Status: DC | PRN

## 2011-10-01 MED ORDER — ALPRAZOLAM 1 MG PO TABS
1.0000 mg | ORAL_TABLET | Freq: Three times a day (TID) | ORAL | Status: DC
  Administered 2011-10-01 – 2011-10-14 (×35): 1 mg via ORAL
  Filled 2011-10-01 (×35): qty 1

## 2011-10-01 MED ORDER — ONDANSETRON HCL 4 MG PO TABS: 4.0000 mg | ORAL_TABLET | Freq: Four times a day (QID) | ORAL | Status: DC | PRN

## 2011-10-01 MED ORDER — HYPROMELLOSE (GONIOSCOPIC) 2.5 % OP SOLN: 1.0000 [drp] | OPHTHALMIC | Status: DC | PRN

## 2011-10-01 MED ORDER — SODIUM CHLORIDE 0.9 % IV BOLUS (SEPSIS)
500.0000 mL | Freq: Once | INTRAVENOUS | Status: AC
  Administered 2011-10-01: 1000 mL via INTRAVENOUS

## 2011-10-01 MED ORDER — SODIUM CHLORIDE 0.9 % IV SOLN
INTRAVENOUS | Status: DC
  Administered 2011-10-01: 13:00:00 via INTRAVENOUS

## 2011-10-01 NOTE — ED Notes (Signed)
ZOX:[id number]<AV> Expected date:10/01/11<BR> Expected time:10:33 AM<BR> Means of arrival:Ambulance<BR> Comments:<BR> 76yo. N/V

## 2011-10-01 NOTE — ED Notes (Signed)
States that she has had chills, vomiting and diarrhea for the past two days. Has not been able to keep anything down.

## 2011-10-01 NOTE — ED Notes (Addendum)
Tresa Endo, the patient's granddaughter would like to be told further information with plan of care and can be reached at [phone].    I called Tresa Endo and updated status.  I was very vague as I did not know if I had permission to release details although the patient clearly gave me permission to discuss her case with all family members.

## 2011-10-01 NOTE — H&P (Addendum)
[name] is an 76 y.o. female.   PCP:   No primary provider on file.   Chief Complaint:  N/V/D/Cough/Weak/FTT/Hypokalemia  HPI: 14 f on Prednisone for her RA present today with 1 week of being ill.  She has had a progressive cough for 1 week and it sounds wet with min sputum.  She had developed N/V/D on Saturday and then progressive weakness and FTT.  She has not been able to take meds well.  She has mostly tried to sleep it off and she kept expecting to get better.  She has not and presented to the ED today.  Other Sxs include fever, fatigue, abdominal pain, nausea, chills,  vomiting and diarrhea. Her N/V are associated with abdominal pain, chills, cough, diarrhea and a fever. Her last Emesis was this am. She hurts all over. Some lightheadedness. She doesn't have a clear sick contact. No relief with her medications. Nothing makes it worse. She states she is not hungry.  She has been given IVF and is unable to tolerate orals and will be admitted for eval and Treat.  Past Medical History:   Fatigue Atypical Chest Pain Recurrent, Pancreatitis Anxiety/Depression LBP B, hand pain/ R, wrist edema Osteoporosis/Vit. D deficient Rheumatoid Arthritis Diarrhea, pancreatic insufficiency Gait instability and falls.  Physicians involved in care:  *Warnell Forester (Gyn)  *Al Little (Cards)  *Vilinda Boehringer (GI)  *Darvin Neighbours (Urol)  *Ron Gioffre (Ortho)  Dierdre Forth (Rheum)     Past Medical History  Diagnosis Date  . Osteoporosis   . Rheumatoid arthritis   . Murmur, cardiac    Past Surgical History  Procedure Date  . Knee arthroscopy     2 on left  . Cholecystectomy   . Thyroidectomy   . Abdominal hysterectomy    Surgical History : 1960's Cholecystectomy            Partial hysterectomy 1970 L, knee 1996 Bunionectomy 1996 L, knee 3/99 L, rotator cuff        TAH, BSO 7/09 L, hip fx, ORIF      Allergies:   Allergies  Allergen Reactions  . Penicillins Hives      Medications:  1)  Alprazolam 1 Mg Tabs (Alprazolam) .... Take one tablet by mouth three times daily as needed 2)  Amitriptyline Hcl 25 Mg Tabs (Amitriptyline hcl) .Marland Kitchen.. 1 tab po qhs 3)  Fosamax 70 Mg Tabs (Alendronate sodium) .Marland Kitchen.. 1 tab po qwk 4)  Prednisone 5 Mg Tabs (Prednisone) .... Take one and one half tablet by mouth daily 5)  Remeron 30 Mg Tabs (Mirtazapine) .Marland Kitchen.. 1 tab po qhs 6)  Xanax 0.5 Mg Tabs (Alprazolam) .Marland Kitchen.. 1 tab po bid prn 7)  Vicodin 5-500 Mg Tabs (Hydrocodone-acetaminophen) .Marland Kitchen.. 1 tab po tid prn 8)  Vitamin D 1000 Unit Caps (Cholecalciferol) .... Take one capsule by mouth every day 9)  Bupropion Hcl 75 Mg Tabs (Bupropion hcl) .... Take one tablet by mouth daily 10)  Vitamin D (ergocalciferol) 50000 Unit Caps (Ergocalciferol) .... Take one capsule by mouth every week     Prior to Admission medications   Medication Sig Start Date End Date Taking? Authorizing Provider  ALPRAZolam Prudy Feeler) 1 MG tablet Take 1 mg by mouth 3 (three) times daily. Patient states she takes 3 times a day every day per patient.    Yes Historical Provider, MD  hydroxypropyl methylcellulose (ISOPTO TEARS) 2.5 % ophthalmic solution Place 1 drop into both eyes as needed. For dry eyes    Yes Historical  Provider, MD  Vitamin D, Ergocalciferol, (DRISDOL) 50000 UNITS CAPS Take 50,000 Units by mouth every 7 (seven) days. Takes every Sunday per patient.    Yes Historical Provider, MD  alendronate (FOSAMAX) 70 MG tablet Take 70 mg by mouth every 7 (seven) days. Take with a full glass of water on an empty stomach.[name] on sundays per patient.     Historical Provider, MD  amitriptyline (ELAVIL) 25 MG tablet Take 50 mg by mouth at bedtime.     Historical Provider, MD  buPROPion (WELLBUTRIN) 75 MG tablet Take 75 mg by mouth every morning.     Historical Provider, MD  HYDROcodone-acetaminophen (VICODIN) 5-500 MG per tablet Take 1 tablet by mouth 3 (three) times daily as needed. For pain     Historical Provider,  MD  mirtazapine (REMERON) 30 MG tablet Take 30 mg by mouth at bedtime.      Historical Provider, MD  predniSONE (DELTASONE) 2.5 MG tablet Take 7.5 mg by mouth every morning. Patient takes 3 times a day to make 7.5mg .    Historical Provider, MD  sulfaSALAzine (AZULFIDINE) 500 MG tablet Take 500 mg by mouth 2 (two) times daily.      Historical Provider, MD     Medications Prior to Admission  Medication Dose Route Frequency Provider Last Rate Last Dose  . 0.9 %  sodium chloride infusion   Intravenous Continuous Juliet Rude. Rubin Payor, MD 125 mL/hr at 10/01/11 1302    . chlorpheniramine-HYDROcodone (TUSSIONEX) 10-8 MG/5ML suspension 5 mL  5 mL Oral Once Harrold Donath R. Pickering, MD   5 mL at 10/01/11 1431  . ondansetron (ZOFRAN) 4 MG/2ML injection           . potassium chloride 10 mEq in 100 mL IVPB  10 mEq Intravenous Once [employer]. Pickering, MD   10 mEq at 10/01/11 1302  . sodium chloride 0.9 % bolus 500 mL  500 mL Intravenous Once Harrold Donath R. Pickering, MD   1,000 mL at 10/01/11 1138   Medications Prior to Admission  Medication Sig Dispense Refill  . ALPRAZolam (XANAX) 1 MG tablet Take 1 mg by mouth 3 (three) times daily. Patient states she takes 3 times a day every day per patient.       . Vitamin D, Ergocalciferol, (DRISDOL) 50000 UNITS CAPS Take 50,000 Units by mouth every 7 (seven) days. Takes every Sunday per patient.       Marland Kitchen alendronate (FOSAMAX) 70 MG tablet Take 70 mg by mouth every 7 (seven) days. Take with a full glass of water on an empty stomach.[name] on sundays per patient.       Marland Kitchen amitriptyline (ELAVIL) 25 MG tablet Take 50 mg by mouth at bedtime.       Marland Kitchen buPROPion (WELLBUTRIN) 75 MG tablet Take 75 mg by mouth every morning.       Marland Kitchen HYDROcodone-acetaminophen (VICODIN) 5-500 MG per tablet Take 1 tablet by mouth 3 (three) times daily as needed. For pain       . mirtazapine (REMERON) 30 MG tablet Take 30 mg by mouth at bedtime.        . predniSONE (DELTASONE) 2.5 MG tablet Take 7.5 mg by  mouth every morning. Patient takes 3 times a day to make 7.5mg .      . sulfaSALAzine (AZULFIDINE) 500 MG tablet Take 500 mg by mouth 2 (two) times daily.           Social History:  reports that she has never smoked. She has  never used smokeless tobacco. She reports that she does not drink alcohol or use illicit drugs.  Widow since 1999 (husband killed while hunting), 3 children, 1 GD, Retired, [place], [place], lives by herself. Tobacco use:  never smoker Passive smoke exposure:  no Drug use:  no HIV high-risk behavior:  no Caffeine use:  0 drinks per day Alcohol use:  no Exercise:  no Seatbelt use:  100 % Sun Exposure:  occasionally  Colonoscopy History:    Date of Last Colonoscopy:  05/13/2011  Mammogram History:    Date of Last Mammogram:  06/15/2010  PAP Smear History:     Date of Last PAP Smear:  06/27/2011    Results:  s/p TAH and BSO      Family History: History reviewed. No pertinent family history.  Father (D) 71's, Colon CA & DM Mother (D) 60 Siblings, 3 sisters (2 with DM & 1 D-Gore CA) & 2 brothers (epilepsy) Children, 1 son & 2 daughters                 1 GD Positive family history of DM & Colon CA   Review of Systems:  Review of Systems - See HPI. Pain all over - nothing Focal. No CP.  + SOB and Tachypnea. No HA. No Dysuria. Weak All other ROS (-)  Physical Exam:  Blood pressure 135/63, pulse 119, temperature 99 F (37.2 C), temperature source Oral, resp. rate 18, weight 142 lb (64.411 kg), SpO2 95.00%. Filed Vitals:   10/01/11 1107 10/01/11 1116 10/01/11 1650  BP: 148/79  135/63  Pulse: 108  119  Temp:  98.8 F (37.1 C) 99 F (37.2 C)  TempSrc: Oral Oral Oral  Resp: 24  18  Weight: 142 lb (64.411 kg)    SpO2: 94%  95%   General appearance: alert, cooperative, appears older than stated age, fatigued and mild distress Head: Normocephalic, without obvious abnormality, atraumatic Eyes: conjunctivae/corneas clear. PERRL, EOM's intact.  Nose: Nares  normal. Septum midline. Mucosa normal. No drainage or sinus tenderness. Throat: dry. Neck: no adenopathy, no carotid bruit, no JVD and thyroid not enlarged, symmetric, no tenderness/mass/nodules Resp: diminished Breath sounds - wet cough.  Wearing O2.  Mild Tachypnea. Cardio: regular rate and rhythm, S1, S2 normal, no murmur, click, rub or gallop GI: soft, non-tender; Extremities: extremities normal, atraumatic, no cyanosis or edema Pulses: 2+ and symmetric Lymph nodes: Cervical adenopathy: no cervical lymphadenopathy Neurologic: Alert and oriented X 3, normal strength and tone.  Some Changes c/w RA   Labs on Admission:   [facility] 10/01/11 1140  NA 138  K 2.8*  CL 97  CO2 30  GLUCOSE 116*  BUN 13  CREATININE 0.53  CALCIUM 9.1  MG --  PHOS --    Basename 10/01/11 1140  AST 15  ALT 10  ALKPHOS 55  BILITOT 1.2  PROT 6.6  ALBUMIN 3.4*    Basename 10/01/11 1140  LIPASE 25  AMYLASE --    Basename 10/01/11 1140  WBC 13.9*  NEUTROABS 11.9*  HGB 12.3  HCT 36.3  MCV 101.7*  PLT 200    Basename 10/01/11 1141  CKTOTAL --  CKMB --  CKMBINDEX --  TROPONINI <0.30     LAB RESULT POCT:  Results for orders placed during the hospital encounter of 10/01/11  CBC      Component Value Range   WBC 13.9 (*) 4.0 - 10.5 (K/uL)   RBC 3.57 (*) 3.87 - 5.11 (MIL/uL)   Hemoglobin 12.3  12.0 - 15.0 (g/dL)   HCT 16.1  09.6 - 04.5 (%)   MCV 101.7 (*) 78.0 - 100.0 (fL)   MCH 34.5 (*) 26.0 - 34.0 (pg)   MCHC 33.9  30.0 - 36.0 (g/dL)   RDW 40.9  81.1 - 91.4 (%)   Platelets 200  150 - 400 (K/uL)  DIFFERENTIAL      Component Value Range   Neutrophils Relative 86 (*) 43 - 77 (%)   Neutro Abs 11.9 (*) 1.7 - 7.7 (K/uL)   Lymphocytes Relative 8 (*) 12 - 46 (%)   Lymphs Abs 1.1  0.7 - 4.0 (K/uL)   Monocytes Relative 6  3 - 12 (%)   Monocytes Absolute 0.8  0.1 - 1.0 (K/uL)   Eosinophils Relative 0  0 - 5 (%)   Eosinophils Absolute 0.0  0.0 - 0.7 (K/uL)   Basophils Relative 0  0 -  1 (%)   Basophils Absolute 0.0  0.0 - 0.1 (K/uL)  COMPREHENSIVE METABOLIC PANEL      Component Value Range   Sodium 138  135 - 145 (mEq/L)   Potassium 2.8 (*) 3.5 - 5.1 (mEq/L)   Chloride 97  96 - 112 (mEq/L)   CO2 30  19 - 32 (mEq/L)   Glucose, Bld 116 (*) 70 - 99 (mg/dL)   BUN 13  6 - 23 (mg/dL)   Creatinine, Ser 7.82  0.50 - 1.10 (mg/dL)   Calcium 9.1  8.4 - 95.6 (mg/dL)   Total Protein 6.6  6.0 - 8.3 (g/dL)   Albumin 3.4 (*) 3.5 - 5.2 (g/dL)   AST 15  0 - 37 (U/L)   ALT 10  0 - 35 (U/L)   Alkaline Phosphatase 55  39 - 117 (U/L)   Total Bilirubin 1.2  0.3 - 1.2 (mg/dL)   GFR calc non Af Amer >90  >90 (mL/min)   GFR calc Af Amer >90  >90 (mL/min)  LIPASE, BLOOD      Component Value Range   Lipase 25  11 - 59 (U/L)  TROPONIN I      Component Value Range   Troponin I <0.30  <0.30 (ng/mL)  URINALYSIS, ROUTINE W REFLEX MICROSCOPIC      Component Value Range   Color, Urine YELLOW  YELLOW    APPearance CLOUDY (*) CLEAR    Specific Gravity, Urine 1.020  1.005 - 1.030    pH 7.5  5.0 - 8.0    Glucose, UA NEGATIVE  NEGATIVE (mg/dL)   Hgb urine dipstick MODERATE (*) NEGATIVE    Bilirubin Urine NEGATIVE  NEGATIVE    Ketones, ur >80 (*) NEGATIVE (mg/dL)   Protein, ur 213 (*) NEGATIVE (mg/dL)   Urobilinogen, UA 1.0  0.0 - 1.0 (mg/dL)   Nitrite POSITIVE (*) NEGATIVE    Leukocytes, UA MODERATE (*) NEGATIVE   URINE MICROSCOPIC-ADD ON      Component Value Range   Squamous Epithelial / LPF RARE  RARE    WBC, UA 11-20  <3 (WBC/hpf)   RBC / HPF 3-6  <3 (RBC/hpf)   Bacteria, UA MANY (*) RARE    Urine-Other MUCOUS PRESENT        Radiological Exams on Admission: Dg Chest 2 View  10/01/2011  *RADIOLOGY REPORT*  Clinical Data: Chest pain, shortness of breath, vomiting.  CHEST - 2 VIEW  Comparison: CT chest dated 09/16/2011  Findings: No evidence of acute infiltrate, edema or pleural effusion.  Heart size is normal.  The bony thorax shows  osteopenia and stable compression deformity of  the T6 vertebral body which is similar to appearance on the sagittally reconstructed imaging from recent CT.  IMPRESSION: No acute findings.  Stable compression of the T6 vertebral body.  Original Report Authenticated By: Reola Calkins, M.D.      Orders placed during the hospital encounter of 10/01/11  . ED EKG  . ED EKG     Assessment/Plan Principal Problem:  *UTI (lower urinary tract infection) Active Problems:  Fever  Cough  Failure to thrive in adult  Weakness generalized  Rheumatoid arthritis  Type II or unspecified type diabetes mellitus without mention of complication, not stated as uncontrolled  76 f on Prednisone for her RA present today with UTI, ?PNA, Fever, Fatigue, Tachycardia, Leukocytosis,  N/V/D, progressive weakness and FTT.    Problem #1 - She has Fever chills cough and Leukocytosis - She is immunosuppressed.  Although her CXR was (-) - We will treat her as if she has PNA.  Levaquin, DVT prophylaxis, O2, Incentive spirometry, flutter valve, pulm toilet, monitor and follow up CXR in am.  Stress Dose steroids ordered. She appears to have a UTI - the Levaquin will cover - add U culture.  Problem # 2 - NVD. Antiemetics and supportive care   Problem # 3:  DIABETES MELLITUS, TYPE II (ICD-250.00)  follw CBGs  Problem # 4  ANXIETY DEPRESSION (ICD-300.4) stable on current meds - continue  Problem # 5:  ARTHRITIS, RHEUMATOID (ICD-714.0) she has stable moderate arthralgias from RA. Stress dose steroids for now.  Problem # 6  BACK PAIN, LUMBAR (ICD-724.2)    Vicodin 5-500 Mg Tabs (Hydrocodone-acetaminophen) .Marland Kitchen... 1 tab po tid prn   Problem # 7:  OSTEOPOROSIS (ICD-733.00) with known compression Fx clinically stable on fosamax and vitamin D.   Problem #8 Tachycardia -EKG  Date: 10/01/2011  Rate: 97  Rhythm: normal sinus rhythm  QRS Axis: normal  Intervals: normal  ST/T Wave abnormalities: nonspecific ST changes  Conduction Disutrbances:LVH with  repolarization abnormality  Narrative Interpretation: nonspecific ECG changes.  Old EKG Reviewed: changes noted   Will monitor.   Tetanus Vaccine: 03/2002 Pneumovax: 03/2005  Zostavax:     Ashlee Player M 10/01/2011, 6:37 PM      On recheck her Ab is obese/distented and min tender.  No reb or guarding.  + BS.

## 2011-10-01 NOTE — ED Provider Notes (Addendum)
History     CSN: [id number] Arrival date & time: 10/01/2011 10:56 AM   First MD Initiated Contact with Patient 10/01/11 1120      Chief Complaint  Patient presents with  . Chills  . Diarrhea  . Emesis    (Consider location/radiation/quality/duration/timing/severity/associated sxs/prior treatment) Patient is a 76 y.o. female presenting with diarrhea and vomiting. The history is provided by the patient.  Diarrhea The primary symptoms include fever, fatigue, abdominal pain, nausea, vomiting and diarrhea. Primary symptoms do not include rash.  The illness is also significant for chills. The illness does not include back pain.  Emesis  Associated symptoms include abdominal pain, chills, cough, diarrhea and a fever. Pertinent negatives include no headaches.   the patient had nausea vomiting and some diarrhea for the last 2 days. Started initially with nausea vomiting and now is primarily diarrhea. Some mild abdominal pain. She also has a cough fevers and chills. No production with the cough. No dysuria. She states she hurts all over. Some lightheadedness. She doesn't have a clear sick contact, but states she was with her great grand kids. No relief with her medications. Nothing makes it worse. She states she is not hungry.  Past Medical History  Diagnosis Date  . Osteoporosis   . Rheumatoid arthritis   . Murmur, cardiac     History reviewed. No pertinent past surgical history.  No family history on file.  History  Substance Use Topics  . Smoking status: Never Smoker   . Smokeless tobacco: Not on file  . Alcohol Use: No    OB History    Grav Para Term Preterm Abortions TAB SAB Ect Mult Living                  Review of Systems  Constitutional: Positive for fever, chills, appetite change and fatigue. Negative for activity change.  HENT: Negative for neck stiffness.   Eyes: Negative for pain.  Respiratory: Positive for cough. Negative for chest tightness and shortness of  breath.   Cardiovascular: Negative for chest pain and leg swelling.  Gastrointestinal: Positive for nausea, vomiting, abdominal pain and diarrhea.  Genitourinary: Negative for flank pain.  Musculoskeletal: Negative for back pain.  Skin: Negative for rash.  Neurological: Positive for light-headedness. Negative for weakness, numbness and headaches.  Psychiatric/Behavioral: Negative for behavioral problems.    Allergies  Penicillins  Home Medications   Current Outpatient Rx  Name Route Sig Dispense Refill  . ALPRAZOLAM 1 MG PO TABS Oral Take 1 mg by mouth 3 (three) times daily. Patient states she takes 3 times a day every day per patient.     Marland Kitchen HYPROMELLOSE 2.5 % OP SOLN Both Eyes Place 1 drop into both eyes as needed. For dry eyes     . VITAMIN D (ERGOCALCIFEROL) 50000 UNITS PO CAPS Oral Take 50,000 Units by mouth every 7 (seven) days. Takes every Sunday per patient.     . ALENDRONATE SODIUM 70 MG PO TABS Oral Take 70 mg by mouth every 7 (seven) days. Take with a full glass of water on an empty stomach.[name] on sundays per patient.     Marland Kitchen AMITRIPTYLINE HCL 25 MG PO TABS Oral Take 50 mg by mouth at bedtime.     . BUPROPION HCL 75 MG PO TABS Oral Take 75 mg by mouth every morning.     Marland Kitchen HYDROCODONE-ACETAMINOPHEN 5-500 MG PO TABS Oral Take 1 tablet by mouth 3 (three) times daily as needed. For pain     .  MIRTAZAPINE 30 MG PO TABS Oral Take 30 mg by mouth at bedtime.      Marland Kitchen PREDNISONE 2.5 MG PO TABS Oral Take 7.5 mg by mouth every morning. Patient takes 3 times a day to make 7.5mg .    . SULFASALAZINE 500 MG PO TABS Oral Take 500 mg by mouth 2 (two) times daily.        BP 148/79  Pulse 108  Temp(Src) 98.8 F (37.1 C) (Oral)  Resp 24  Wt 142 lb (64.411 kg)  SpO2 94%  Physical Exam  Nursing note and vitals reviewed. Constitutional: She is oriented to person, place, and time. She appears well-developed and well-nourished.  HENT:  Head: Normocephalic and atraumatic.  Eyes: EOM are  normal. Pupils are equal, round, and reactive to light.  Neck: Normal range of motion. Neck supple.  Cardiovascular: Regular rhythm and normal heart sounds.   No murmur heard. Pulmonary/Chest: Effort normal. No respiratory distress.       Harsh breath sounds throughout  Abdominal: Soft. Bowel sounds are normal. She exhibits no distension. There is tenderness. There is no rebound and no guarding.       Mild upper abdominal tenderness without rebound or guarding  Musculoskeletal: Normal range of motion.  Neurological: She is alert and oriented to person, place, and time. No cranial nerve deficit.  Skin: Skin is warm and dry.  Psychiatric: She has a normal mood and affect. Her speech is normal.    ED Course  Procedures (including critical care time)  Labs Reviewed  CBC - Abnormal; Notable for the following:    WBC 13.9 (*)    RBC 3.57 (*)    MCV 101.7 (*)    MCH 34.5 (*)    All other components within normal limits  DIFFERENTIAL - Abnormal; Notable for the following:    Neutrophils Relative 86 (*)    Neutro Abs 11.9 (*)    Lymphocytes Relative 8 (*)    All other components within normal limits  COMPREHENSIVE METABOLIC PANEL - Abnormal; Notable for the following:    Potassium 2.8 (*)    Glucose, Bld 116 (*)    Albumin 3.4 (*)    All other components within normal limits  LIPASE, BLOOD  URINALYSIS, ROUTINE W REFLEX MICROSCOPIC   Dg Chest 2 View  10/01/2011  *RADIOLOGY REPORT*  Clinical Data: Chest pain, shortness of breath, vomiting.  CHEST - 2 VIEW  Comparison: CT chest dated 09/16/2011  Findings: No evidence of acute infiltrate, edema or pleural effusion.  Heart size is normal.  The bony thorax shows osteopenia and stable compression deformity of the T6 vertebral body which is similar to appearance on the sagittally reconstructed imaging from recent CT.  IMPRESSION: No acute findings.  Stable compression of the T6 vertebral body.  Original Report Authenticated By: Reola Calkins, M.D.     1. Nausea vomiting and diarrhea   2. Hypokalemia   3. Cough       MDM  Nausea vomiting diarrhea for the last few days. Cough. Mild tachycardia. X-ray does not show pneumonia. Blood work shows a potassium 2.8. She's continued to not tolerate orals here. She'll be admitted to Dr. Silvano Rusk service. I discussed with Dr. Timothy Lasso   Date: 10/01/2011  Rate: 97  Rhythm: normal sinus rhythm  QRS Axis: normal  Intervals: normal  ST/T Wave abnormalities: nonspecific ST changes  Conduction Disutrbances:LVH with repolarization abnormality  Narrative Interpretation: nonspecific ECG changes.  Old EKG Reviewed: changes noted  Juliet Rude. Rubin Payor, MD 10/01/11 1414  Juliet Rude. Rubin Payor, MD 10/01/11 1415

## 2011-10-01 NOTE — ED Notes (Signed)
Patient made aware she would need to be catheterized again for another sample of urine. Pt in no apparent distress and aware of plan

## 2011-10-01 NOTE — ED Notes (Signed)
Complaints of nausea with EMS. Given Zofran 4 mg IV before arrival

## 2011-10-01 NOTE — ED Notes (Signed)
Admission MD finishing up with patient

## 2011-10-01 NOTE — ED Notes (Signed)
[name] and [name]- Daughter in law and son would like to be called with a room number and changes in care at [phone] cell.

## 2011-10-01 NOTE — ED Notes (Signed)
Attempted to call report to floor.  Nurse still in report

## 2011-10-02 ENCOUNTER — Inpatient Hospital Stay (HOSPITAL_COMMUNITY): Payer: Medicare Other

## 2011-10-02 LAB — DIFFERENTIAL
Eosinophils Absolute: 0 10*3/uL (ref 0.0–0.7)
Lymphs Abs: 0.5 10*3/uL — ABNORMAL LOW (ref 0.7–4.0)
Monocytes Relative: 4 % (ref 3–12)
Neutrophils Relative %: 92 % — ABNORMAL HIGH (ref 43–77)

## 2011-10-02 LAB — GLUCOSE, CAPILLARY
Glucose-Capillary: 187 mg/dL — ABNORMAL HIGH (ref 70–99)
Glucose-Capillary: 88 mg/dL (ref 70–99)

## 2011-10-02 LAB — COMPREHENSIVE METABOLIC PANEL
AST: 11 U/L (ref 0–37)
Albumin: 2.5 g/dL — ABNORMAL LOW (ref 3.5–5.2)
Calcium: 7.4 mg/dL — ABNORMAL LOW (ref 8.4–10.5)
Creatinine, Ser: 0.47 mg/dL — ABNORMAL LOW (ref 0.50–1.10)
GFR calc non Af Amer: >90 mL/min (ref >90)

## 2011-10-02 LAB — CBC
Hemoglobin: 10.2 g/dL — ABNORMAL LOW (ref 12.0–15.0)
MCH: 33.7 pg (ref 26.0–34.0)
MCV: 104.6 fL — ABNORMAL HIGH (ref 78.0–100.0)
RBC: 2.82 MIL/uL — ABNORMAL LOW (ref 3.87–5.11)

## 2011-10-02 LAB — INFLUENZA PANEL BY PCR (TYPE A & B): Influenza A By PCR: NEGATIVE

## 2011-10-02 LAB — PRO B NATRIURETIC PEPTIDE: Pro B Natriuretic peptide (BNP): 2239 pg/mL — ABNORMAL HIGH (ref 0–125)

## 2011-10-02 MED ORDER — ENSURE IMMUNE HEALTH PO LIQD
237.0000 mL | Freq: Two times a day (BID) | ORAL | Status: DC
  Administered 2011-10-03 – 2011-10-11 (×15): 237 mL via ORAL

## 2011-10-02 MED ORDER — PANCRELIPASE (LIP-PROT-AMYL) 12000-38000 UNITS PO CPEP
1.0000 | ORAL_CAPSULE | Freq: Three times a day (TID) | ORAL | Status: DC
  Administered 2011-10-02 – 2011-10-14 (×37): 1 via ORAL
  Filled 2011-10-02 (×39): qty 1

## 2011-10-02 MED ORDER — GUAIFENESIN-DM 100-10 MG/5ML PO SYRP
5.0000 mL | ORAL_SOLUTION | ORAL | Status: DC | PRN
  Administered 2011-10-02 – 2011-10-13 (×12): 5 mL via ORAL
  Filled 2011-10-02 (×11): qty 10

## 2011-10-02 MED ORDER — POTASSIUM CHLORIDE IN NACL 20-0.9 MEQ/L-% IV SOLN
INTRAVENOUS | Status: DC
  Administered 2011-10-02 (×2): via INTRAVENOUS
  Administered 2011-10-03: 100 mL/h via INTRAVENOUS
  Administered 2011-10-03 – 2011-10-05 (×5): via INTRAVENOUS
  Administered 2011-10-06: 20 mL/h via INTRAVENOUS
  Administered 2011-10-06 – 2011-10-09 (×2): via INTRAVENOUS
  Administered 2011-10-10 (×2): 20 mL/h via INTRAVENOUS
  Administered 2011-10-11 – 2011-10-14 (×5): via INTRAVENOUS
  Filled 2011-10-02 (×22): qty 1000

## 2011-10-02 NOTE — Progress Notes (Signed)
INITIAL ADULT NUTRITION ASSESSMENT Date: 10/02/2011   Time: 2:00 PM Reason for Assessment: consult  ASSESSMENT: Female 75 y.o.  Dx: UTI (lower urinary tract infection)  Hx:  Past Medical History  Diagnosis Date  . Osteoporosis   . Rheumatoid arthritis   . Murmur, cardiac    Past Surgical History  Procedure Date  . Knee arthroscopy     2 on left  . Cholecystectomy   . Thyroidectomy   . Abdominal hysterectomy     Related Meds:  Scheduled Meds:   . sodium chloride   Intravenous STAT  . ALPRAZolam  1 mg Oral TID  . amitriptyline  50 mg Oral QHS  . buPROPion  75 mg Oral Daily  . chlorpheniramine-HYDROcodone  5 mL Oral Once  . enoxaparin  40 mg Subcutaneous Q24H  . levofloxacin (LEVAQUIN) IV  500 mg Intravenous Q24H  . lipase/protease/amylase  1 capsule Oral TID AC  . methylPREDNISolone (SOLU-MEDROL) injection  60 mg Intravenous Q12H  . mirtazapine  30 mg Oral QHS  . ondansetron      . potassium chloride  10 mEq Intravenous Once  . Vitamin D (Ergocalciferol)  50,000 Units Oral Q Sun  . DISCONTD: enoxaparin  40 mg Subcutaneous Q24H   Continuous Infusions:   . 0.9 % NaCl with KCl 20 mEq / L 100 mL/hr at 10/02/11 0827  . DISCONTD: sodium chloride 125 mL/hr at 10/01/11 1302  . DISCONTD: 0.9 % NaCl with KCl 40 mEq / L 50 mL/hr at 10/01/11 2000   PRN Meds:.acetaminophen, acetaminophen, albuterol, alum & mag hydroxide-simeth, HYDROcodone-acetaminophen, ondansetron (ZOFRAN) IV, ondansetron, polyvinyl alcohol, DISCONTD: hydroxypropyl methylcellulose, DISCONTD: ondansetron (ZOFRAN) IV   Ht: 5\' 3"  (160 cm)  Wt: 136 lb 4.8 oz (61.825 kg)  Ideal Wt: 61.4 % Ideal Wt: 100%  Usual Wt: Last known wt: 63.8 kg (12/2010) % Usual Wt: 96% Wt loss appears to be minimal, ~3% in greater than 9 months.  Body mass index is 24.14 kg/(m^2).  Food/Nutrition Related Hx:  Pt and RN unavailable at time of visit. Pt admitted with nausea, abdominal pain, diarrhea.  Pt has not had any  vomiting or diarrhea overnight.    Pt with some degree of pancreatic insufficiency in the past, now on a Creon regimen with meals. Pt admitted overnight, no documentation with respect to intake yet in chart. Pt is also on chronic prednisone for her RA. Unable to further discern degree of dysphagia at this time.  Labs:  CMP     Component Value Date/Time   NA 140 10/02/2011 0440   K 3.7 10/02/2011 0440   CL 106 10/02/2011 0440   CO2 26 10/02/2011 0440   GLUCOSE 148* 10/02/2011 0440   BUN 9 10/02/2011 0440   CREATININE 0.47* 10/02/2011 0440   CALCIUM 7.4* 10/02/2011 0440   PROT 5.6* 10/02/2011 0440   ALBUMIN 2.5* 10/02/2011 0440   AST 11 10/02/2011 0440   ALT 7 10/02/2011 0440   ALKPHOS 48 10/02/2011 0440   BILITOT 0.5 10/02/2011 0440   GFRNONAA >90 10/02/2011 0440   GFRAA >90 10/02/2011 0440    CBC    Component Value Date/Time   WBC 13.5* 10/02/2011 0440   RBC 2.82* 10/02/2011 0440   HGB 10.2* 10/02/2011 0440   HCT 29.5* 10/02/2011 0440   PLT 174 10/02/2011 0440   MCV 104.6* 10/02/2011 0440   MCH 33.7 10/02/2011 0440   MCHC 32.2 10/02/2011 0440   RDW 14.6 10/02/2011 0440   LYMPHSABS 0.5* 10/02/2011 0440  MONOABS 0.6 10/02/2011 0440   EOSABS 0.0 10/02/2011 0440   BASOSABS 0.0 10/02/2011 0440    Intake:  Output:   Intake/Output Summary (Last 24 hours) at 10/02/11 1402 Last data filed at 10/02/11 0900  Gross per 24 hour  Intake    120 ml  Output    500 ml  Net   -380 ml   I/O last 3 completed shifts: In: -  Out: 500 [Urine:500] Total I/O In: 120 [P.O.:120] Out: -   No BMs.  Diet Order: Full Liquid  Supplements/Tube Feeding: none at this time  IVF:    0.9 % NaCl with KCl 20 mEq / L Last Rate: 100 mL/hr at 10/02/11 0454  DISCONTD: sodium chloride Last Rate: 125 mL/hr at 10/01/11 1302  DISCONTD: 0.9 % NaCl with KCl 40 mEq / L Last Rate: 50 mL/hr at 10/01/11 2000    Estimated Nutritional Needs:   Kcal: 1420-1550 kcal  Protein: 74-86g Fluid: >1.5  L//day  NUTRITION DIAGNOSIS: -Altered GI function (NI-1.4).  Status: Ongoing  RELATED TO: possible pancreatic insufficiency  AS EVIDENCE BY: pt with increased diarrhea with no known infectious cause yet determined  MONITORING/EVALUATION(Goals): 1.  Food/Beverage; diet advancement to Low Fat goal. Pt consuming >50% of meal with supplements daily.  EDUCATION NEEDS: -Education not appropriate at this time  INTERVENTION: 1.  Supplements; Ensure Immune Health BID WITH MEALS. 2.  Other providers; if s/s aspiration, consider consult to SLP.  Dietitian #: [phone]  DOCUMENTATION CODES Per approved criteria  -Not Applicable    Loyce Dys [facility] 10/02/2011, 2:00 PM

## 2011-10-02 NOTE — Progress Notes (Signed)
NOTIFIED DR. PATERSON PT. FLU PCR CAME BACK NEGATIVE. NO NEW ORDERS AT THIS TIME.

## 2011-10-02 NOTE — Progress Notes (Signed)
10/01/11 2030 From home alone. Pt admitted to unit on tele. Pt presented to ED with n/v/d, fever, chills, and cough. States she has had diarrhea x4 days. Placed on enteric and droplet precautions- entered consult with infection prevention. A&O x4, generalized weakness, unsteady gait, urgency and incontinent episodes, and tachycardic. VS are: 98.2, 145/79, 107, 95% on 2L via n/c. Pt denies any pain, nausea or vomiting at current time. Placed on bed alarm and informed to call for help getting oob. Mechele Collin

## 2011-10-02 NOTE — Progress Notes (Signed)
Subjective: She still feels weak with mild nausea. She has not had vomiting or diarrhea overnight. She feels sore in the abdomen from coughing. She continues to have a dry cough without shortness of breath on nasal cannula oxygen. She had not had recent antibiotics.  Objective: Vital signs in last 24 hours: Temp:  [97.5 F (36.4 C)-99 F (37.2 C)] 97.5 F (36.4 C) (12/12 0621) Pulse Rate:  [98-119] 98  (12/12 0621) Resp:  [18-24] 20  (12/12 0621) BP: (103-148)/(63-79) 103/71 mmHg (12/12 0621) SpO2:  [94 %-95 %] 95 % (12/12 0621) Weight:  [61.825 kg (136 lb 4.8 oz)-64.411 kg (142 lb)] 136 lb 4.8 oz (61.825 kg) (12/11 2033) Weight change:    Intake/Output from previous day: 12/11 0701 - 12/12 0700 In: -  Out: 300 [Urine:300]   General appearance: alert, cooperative and no distress Resp: clear to auscultation bilaterally Cardio: regular rate and rhythm GI: soft, non-tender; bowel sounds normal; no masses,  no organomegaly Extremities: extremities normal, atraumatic, no cyanosis or edema Neurologic: Grossly normal  Lab Results:  Basename 10/02/11 0440 10/01/11 1140  WBC 13.5* 13.9*  HGB 10.2* 12.3  HCT 29.5* 36.3  PLT 174 200   BMET  Basename 10/02/11 0440 10/01/11 1140  NA 140 138  K 3.7 2.8*  CL 106 97  CO2 26 30  GLUCOSE 148* 116*  BUN 9 13  CREATININE 0.47* 0.53  CALCIUM 7.4* 9.1   CMET CMP     Component Value Date/Time   NA 140 10/02/2011 0440   K 3.7 10/02/2011 0440   CL 106 10/02/2011 0440   CO2 26 10/02/2011 0440   GLUCOSE 148* 10/02/2011 0440   BUN 9 10/02/2011 0440   CREATININE 0.47* 10/02/2011 0440   CALCIUM 7.4* 10/02/2011 0440   PROT 5.6* 10/02/2011 0440   ALBUMIN 2.5* 10/02/2011 0440   AST 11 10/02/2011 0440   ALT 7 10/02/2011 0440   ALKPHOS 48 10/02/2011 0440   BILITOT 0.5 10/02/2011 0440   GFRNONAA >90 10/02/2011 0440   GFRAA >90 10/02/2011 0440    CBG (last 3)   Basename 10/02/11 0612 10/02/11 0046  GLUCAP 138* 187*    INR  RESULTS:   Lab Results  Component Value Date   INR 0.87 09/16/2011   INR 2.6* 05/12/2008   INR 2.3* 05/11/2008     Studies/Results: Dg Chest 2 View  10/01/2011  *RADIOLOGY REPORT*  Clinical Data: Chest pain, shortness of breath, vomiting.  CHEST - 2 VIEW  Comparison: CT chest dated 09/16/2011  Findings: No evidence of acute infiltrate, edema or pleural effusion.  Heart size is normal.  The bony thorax shows osteopenia and stable compression deformity of the T6 vertebral body which is similar to appearance on the sagittally reconstructed imaging from recent CT.  IMPRESSION: No acute findings.  Stable compression of the T6 vertebral body.  Original Report Authenticated By: Reola Calkins, M.D.    Medications: I have reviewed the patient's current medications.  Assessment/Plan: #1 Nausea, Vomiting, and Diarrhea: Most likely from a viral syndrome, perhaps influenza. She had slightly elevated white blood cell count is most likely from prednisone. I doubt she has C. difficile colitis and she has not had recent antibiotics. She has a history of pancreatic insufficiency from several episodes of pancreatitis so in addition to anti-emetics and IV fluids we will add Creon to her regimen. We will advance her to a full liquids diet today. We will recheck a CBC and complete metabolic panel tomorrow morning. #2 Adrenal  Insufficiency: Stable with pulse dose Solu-Medrol. #3 Diabetes Mellitus, Type II: Stable on current medications. #4 Cough: Again, most likely from a viral syndrome and less likely from early pneumonia. She is stable on Levaquin. #5 Hypokalemia: Is most likely from her persistent diarrhea and has resolved with supplemental potassium chloride by IV. We'll change her IV fluids to be normal saline with 20 mEq of potassium per liter and recheck her potassium level tomorrow.  LOS: 1 day   Karlea Mckibbin G 10/02/2011, 7:17 AM

## 2011-10-02 NOTE — Progress Notes (Signed)
PT. FLU PCR CAME BACK NEGATIVE, WILL D/C DROPLET PER INFECTION PREVENTION JEFF BEAMAN.

## 2011-10-03 LAB — COMPREHENSIVE METABOLIC PANEL
ALT: 8 U/L (ref 0–35)
AST: 10 U/L (ref 0–37)
CO2: 27 mEq/L (ref 19–32)
Chloride: 111 mEq/L (ref 96–112)
GFR calc non Af Amer: >90 mL/min (ref >90)
Potassium: 4.3 mEq/L (ref 3.5–5.1)
Sodium: 143 mEq/L (ref 135–145)
Total Bilirubin: 0.3 mg/dL (ref 0.3–1.2)

## 2011-10-03 LAB — GLUCOSE, CAPILLARY: Glucose-Capillary: 152 mg/dL — ABNORMAL HIGH (ref 70–99)

## 2011-10-03 LAB — CBC
Platelets: 224 10*3/uL (ref 150–400)
RBC: 2.96 MIL/uL — ABNORMAL LOW (ref 3.87–5.11)
WBC: 9.6 10*3/uL (ref 4.0–10.5)

## 2011-10-03 LAB — URINE CULTURE: Culture  Setup Time: 201212120203

## 2011-10-03 MED ORDER — LEVOFLOXACIN 500 MG PO TABS
500.0000 mg | ORAL_TABLET | Freq: Every day | ORAL | Status: DC
  Administered 2011-10-03: 500 mg via ORAL
  Filled 2011-10-03 (×2): qty 1

## 2011-10-03 NOTE — Progress Notes (Signed)
Subjective: She was sleeping comfortably this morning, no problems with nausea and vomiting overnight.  Objective: Vital signs in last 24 hours: Temp:  [97.5 F (36.4 C)-97.7 F (36.5 C)] 97.5 F (36.4 C) (12/13 0528) Pulse Rate:  [87-110] 87  (12/13 0528) Resp:  [18-20] 18  (12/13 0528) BP: (106-121)/(70-78) 120/77 mmHg (12/13 0528) SpO2:  [97 %-100 %] 100 % (12/13 0528) Weight:  [64.547 kg (142 lb 4.8 oz)] 142 lb 4.8 oz (64.547 kg) (12/13 0528) Weight change: 0.136 kg (4.8 oz)   Intake/Output from previous day: 12/12 0701 - 12/13 0700 In: 1235 [P.O.:580; I.V.:555; IV Piggyback:100] Out: 1050 [Urine:1050]   General appearance: no distress Resp: clear to auscultation bilaterally Cardio: regular rate and rhythm, S1, S2 normal, no murmur, click, rub or gallop GI: soft, non-tender; bowel sounds normal; no masses,  no organomegaly  Lab Results:  Basename 10/03/11 0340 10/02/11 0440  WBC 9.6 13.5*  HGB 10.3* 10.2*  HCT 31.6* 29.5*  PLT 224 174   BMET  Basename 10/03/11 0340 10/02/11 0440  NA 143 140  K 4.3 3.7  CL 111 106  CO2 27 26  GLUCOSE 144* 148*  BUN 7 9  CREATININE 0.54 0.47*  CALCIUM 8.0* 7.4*   CMET CMP     Component Value Date/Time   NA 143 10/03/2011 0340   K 4.3 10/03/2011 0340   CL 111 10/03/2011 0340   CO2 27 10/03/2011 0340   GLUCOSE 144* 10/03/2011 0340   BUN 7 10/03/2011 0340   CREATININE 0.54 10/03/2011 0340   CALCIUM 8.0* 10/03/2011 0340   PROT 5.7* 10/03/2011 0340   ALBUMIN 2.5* 10/03/2011 0340   AST 10 10/03/2011 0340   ALT 8 10/03/2011 0340   ALKPHOS 53 10/03/2011 0340   BILITOT 0.3 10/03/2011 0340   GFRNONAA >90 10/03/2011 0340   GFRAA >90 10/03/2011 0340    CBG (last 3)   Basename 10/03/11 0021 10/02/11 1939 10/02/11 1243  GLUCAP 216* 143* 88    INR RESULTS:   Lab Results  Component Value Date   INR 0.87 09/16/2011   INR 2.6* 05/12/2008   INR 2.3* 05/11/2008     Studies/Results: Dg Chest 2 View  10/02/2011   *RADIOLOGY REPORT*  Clinical Data: Cough 1 week.  Nonsmoker.  CHEST - 2 VIEW  Comparison: 10/01/2011.  Findings: Interval development of minimal parenchymal changes lung bases.  A subtle infiltrate cannot be entirely excluded.  Central pulmonary vascular prominence.  Heart size within normal limits.  Calcified mildly tortuous aorta.  Thoracic compression fracture unchanged.  IMPRESSION: Interval development of minimal parenchymal changes lung bases.  A subtle infiltrate cannot be entirely excluded.  Original Report Authenticated By: Fuller Canada, M.D.   Dg Chest 2 View  10/01/2011  *RADIOLOGY REPORT*  Clinical Data: Chest pain, shortness of breath, vomiting.  CHEST - 2 VIEW  Comparison: CT chest dated 09/16/2011  Findings: No evidence of acute infiltrate, edema or pleural effusion.  Heart size is normal.  The bony thorax shows osteopenia and stable compression deformity of the T6 vertebral body which is similar to appearance on the sagittally reconstructed imaging from recent CT.  IMPRESSION: No acute findings.  Stable compression of the T6 vertebral body.  Original Report Authenticated By: Reola Calkins, M.D.    Medications: I have reviewed the patient's current medications.  Assessment/Plan: #1 Diarrhea:  Improved and likely due to a viral syndrome or pancreatic insufficiency. Will advance to a  Regular diet. #2 Dehydration:  Improved, may be  able to discharge her to home tomorrow if able to tolerate regular diet. #3 UTI: stable and will change levaquin to po.  LOS: 2 days   Azharia Surratt G 10/03/2011, 6:39 AM

## 2011-10-03 NOTE — Clinical Documentation Improvement (Signed)
GENERIC DOCUMENTATION CLARIFICATION QUERY  THIS DOCUMENT IS NOT A PERMANENT PART OF THE MEDICAL RECORD  TO RESPOND TO THE THIS QUERY, FOLLOW THE INSTRUCTIONS BELOW:  1. If needed, update documentation for the patient's encounter via the notes activity.  2. Access this query again and click edit on the [employer].  3. After updating, or not, click F2 to complete all highlighted (required) fields concerning your review. Select "additional documentation in the medical record" OR "no additional documentation provided".  4. Click Sign note button.  5. The deficiency will fall out of your InBasket *Please let us know if you are not able to compete this workflow by phone or e-mail (listed below).  Please update your documentation within the medical record to reflect your response to this query.                                                                                        10/03/11   Dear Dr. Jarold Motto / Associates,  In a better effort to capture your patient's severity of illness, reflect appropriate length of stay and utilization of resources, a review of the patient medical record has revealed the following indicators.    Based on your clinical judgment, please clarify and document in a progress note and/or discharge summary the clinical condition associated with the following supporting information:  In responding to this query please exercise your independent judgment.  The fact that a query is asked, does not imply that any particular answer is desired or expected.   Based on the patient's current clinical presentation please clarify:  Possible Clinical Conditions? Pt admitted with UTI, Back pain  According to CXR pt with Thoracic compression fx. Please note whether or not you agree pt with Thoracic compression and document in pn or D/C summary. Thank You!   _______Other Condition__________________ _______Cannot Clinically Determine   Supporting Information: UTI,  Back pain Risk Factors:  Signs & Symptoms: Osteoporosis, back pain, UTI  Diagnostics:  Thoracic compression fracture unchanged. 10/02/11  IMPRESSION: Interval development of minimal parenchymal changes lung bases.  A subtle infiltrate cannot be entirely excluded.  10/02/11 The bony thorax shows osteopenia and stable compression deformity of the T6 vertebral body which is similar to appearance on the sagittally reconstructed imaging from recent CT.  IMPRESSION: No acute findings.  Stable compression of the T6 vertebral body   Treatment 10/03/11 HYDROcodone-acetaminophen (NORCO) 5-325 MG   You may use possible, probable, or suspect with inpatient documentation. possible, probable, suspected diagnoses MUST be documented at the time of discharge  Reviewed:  no additional documentation provided 10/04/2011   No new updates from MD ljh  Thank You,  Enis Slipper  Clinical Documentation Specialist:  Pager  Health Information Management [place]

## 2011-10-03 NOTE — Progress Notes (Signed)
CRITICAL VALUE ALERT  Critical value received:  ESBL in urine  Date of notification:  10/03/2011   Time of notification:  11:45 PM   Critical value read back:yes  Nurse who received alert:  B.Martin RN  MD notified (1st page):  Eloise Harman  Time of first page:  11:48 PM   MD notified (2nd page):  Time of second page:  Responding MD:  Eloise Harman  Time MD responded:  11:51 PM

## 2011-10-04 ENCOUNTER — Inpatient Hospital Stay (HOSPITAL_COMMUNITY): Payer: Medicare Other

## 2011-10-04 LAB — LEGIONELLA ANTIGEN, URINE: Legionella Antigen, Urine: NEGATIVE

## 2011-10-04 LAB — GLUCOSE, CAPILLARY: Glucose-Capillary: 234 mg/dL — ABNORMAL HIGH (ref 70–99)

## 2011-10-04 MED ORDER — NITROFURANTOIN MONOHYD MACRO 100 MG PO CAPS
100.0000 mg | ORAL_CAPSULE | Freq: Two times a day (BID) | ORAL | Status: DC
  Administered 2011-10-04 – 2011-10-09 (×12): 100 mg via ORAL
  Filled 2011-10-04 (×16): qty 1

## 2011-10-04 NOTE — Progress Notes (Signed)
Inpatient Diabetes Program Recommendations  AACE/ADA: New Consensus Statement on Inpatient Glycemic Control (2009)  Target Ranges:  Prepandial:   less than 140 mg/dL      Peak postprandial:   less than 180 mg/dL (1-2 hours)      Critically ill patients:  140 - 180 mg/dL   Reason for Visit: Hyperglycemia while on Solumedrol  Inpatient Diabetes Program Recommendations Correction (SSI): Would benefit from covering with sensitive correction scale q 6 hrs to prevent glucose rise into 200's. Diet: While on Solumedrol, would benefit from Carb Mod diet as well  Note: May also want to check HgBA1C if on steroid therapy at all at home. [phone]

## 2011-10-04 NOTE — Progress Notes (Signed)
Subjective: Today she continues to have a dry cough and pervasive weakness. Her diarrhea has resolved. She continues to have mild dysuria without frequency. She does not feel strong enough yet to return home.  Objective: Vital signs in last 24 hours: Temp:  [97.7 F (36.5 C)-97.8 F (36.6 C)] 97.7 F (36.5 C) (12/14 0550) Pulse Rate:  [65-107] 65  (12/14 0550) Resp:  [18-20] 18  (12/14 0550) BP: (125-148)/(75-86) 133/75 mmHg (12/14 0550) SpO2:  [96 %-100 %] 100 % (12/14 0550) Weight:  [66.361 kg (146 lb 4.8 oz)] 146 lb 4.8 oz (66.361 kg) (12/14 0550) Weight change: 1.814 kg (4 lb)   Intake/Output from previous day: 12/13 0701 - 12/14 0700 In: 720 [P.O.:720] Out: 1950 [Urine:1950]   General appearance: alert, cooperative and She has frequent episodes of dry cough without shortness of breath Resp: clear to auscultation bilaterally Cardio: regular rate and rhythm, S1, S2 normal, no murmur, click, rub or gallop GI: soft, non-tender; bowel sounds normal; no masses,  no organomegaly Extremities: extremities normal, atraumatic, no cyanosis or edema Neurologic: Grossly normal  Lab Results:  Basename 10/03/11 0340 10/02/11 0440  WBC 9.6 13.5*  HGB 10.3* 10.2*  HCT 31.6* 29.5*  PLT 224 174   BMET  Basename 10/03/11 0340 10/02/11 0440  NA 143 140  K 4.3 3.7  CL 111 106  CO2 27 26  GLUCOSE 144* 148*  BUN 7 9  CREATININE 0.54 0.47*  CALCIUM 8.0* 7.4*   CMET CMP     Component Value Date/Time   NA 143 10/03/2011 0340   K 4.3 10/03/2011 0340   CL 111 10/03/2011 0340   CO2 27 10/03/2011 0340   GLUCOSE 144* 10/03/2011 0340   BUN 7 10/03/2011 0340   CREATININE 0.54 10/03/2011 0340   CALCIUM 8.0* 10/03/2011 0340   PROT 5.7* 10/03/2011 0340   ALBUMIN 2.5* 10/03/2011 0340   AST 10 10/03/2011 0340   ALT 8 10/03/2011 0340   ALKPHOS 53 10/03/2011 0340   BILITOT 0.3 10/03/2011 0340   GFRNONAA >90 10/03/2011 0340   GFRAA >90 10/03/2011 0340    CBG (last 3)   Basename  10/04/11 0549 10/04/11 0014 10/03/11 1708  GLUCAP 134* 234* 152*    INR RESULTS:   Lab Results  Component Value Date   INR 0.87 09/16/2011   INR 2.6* 05/12/2008   INR 2.3* 05/11/2008     Studies/Results: Dg Chest 2 View  10/02/2011  *RADIOLOGY REPORT*  Clinical Data: Cough 1 week.  Nonsmoker.  CHEST - 2 VIEW  Comparison: 10/01/2011.  Findings: Interval development of minimal parenchymal changes lung bases.  A subtle infiltrate cannot be entirely excluded.  Central pulmonary vascular prominence.  Heart size within normal limits.  Calcified mildly tortuous aorta.  Thoracic compression fracture unchanged.  IMPRESSION: Interval development of minimal parenchymal changes lung bases.  A subtle infiltrate cannot be entirely excluded.  Original Report Authenticated By: Fuller Canada, M.D.    Medications: I have reviewed the patient's current medications.  Assessment/Plan: #1 Urinary Tract Infection: Urine cultures returned showing Escherichia coli that was resistant to many antibiotics including ceftriaxone and Levaquin, thus her infection has not been adequately covered. The organism was sensitive to gentamicin and Macrobid. She will be started on Macrobid today. #2 Weakness: Is most likely due to her acute illness, although she has chronic limited mobility because of advanced osteoarthritis. Today we will have her ambulate on the ward with assistance and see how she does. She may be ready for  discharge within 24-48 hours. If she shows significant gait instability than we will consider transfer to a skilled nursing facility early next week. #3 Cough: Most likely due to of a viral upper respiratory infection and less likely due to pneumonia or congestive heart failure. We will check a chest x-ray today, and tomorrow morning we will check a CBC and BNP test. We will also reduce the rate of her IV fluid infusion now that her diarrhea has resolved. #4 Iatrogenic Adrenal Insufficiency: She is  chronically on prednisone and currently she is on Solu-Medrol for stress corticosteroid dosing. At the time of her vaginal discharge she will need continued treatment with prednisone perhaps at 20 mg daily for the next week and then readjustment at followup office visit.  LOS: 3 days   Delmon Andrada G 10/04/2011, 7:37 AM

## 2011-10-05 LAB — GLUCOSE, CAPILLARY: Glucose-Capillary: 199 mg/dL — ABNORMAL HIGH (ref 70–99)

## 2011-10-05 LAB — BASIC METABOLIC PANEL
BUN: 10 mg/dL (ref 6–23)
Chloride: 99 mEq/L (ref 96–112)
GFR calc Af Amer: >90 mL/min (ref >90)
Glucose, Bld: 124 mg/dL — ABNORMAL HIGH (ref 70–99)
Potassium: 4.2 mEq/L (ref 3.5–5.1)
Sodium: 139 mEq/L (ref 135–145)

## 2011-10-05 LAB — CBC
HCT: 34.7 % — ABNORMAL LOW (ref 36.0–46.0)
Hemoglobin: 11.2 g/dL — ABNORMAL LOW (ref 12.0–15.0)
RDW: 14.5 % (ref 11.5–15.5)
WBC: 12.3 10*3/uL — ABNORMAL HIGH (ref 4.0–10.5)

## 2011-10-05 MED ORDER — PREDNISONE (PAK) 10 MG PO TABS: 20.0000 mg | ORAL_TABLET | Freq: Every day | ORAL | Status: AC

## 2011-10-05 MED ORDER — PREDNISONE 20 MG PO TABS
40.0000 mg | ORAL_TABLET | Freq: Every day | ORAL | Status: DC
  Administered 2011-10-06: 40 mg via ORAL
  Filled 2011-10-05: qty 2

## 2011-10-05 MED ORDER — GUAIFENESIN-DM 100-10 MG/5ML PO SYRP: 5.0000 mL | ORAL_SOLUTION | ORAL | Status: AC | PRN

## 2011-10-05 MED ORDER — PANCRELIPASE (LIP-PROT-AMYL) 12000-38000 UNITS PO CPEP: 1.0000 | ORAL_CAPSULE | Freq: Three times a day (TID) | ORAL | Status: DC

## 2011-10-05 MED ORDER — NITROFURANTOIN MONOHYD MACRO 100 MG PO CAPS: 100.0000 mg | ORAL_CAPSULE | Freq: Two times a day (BID) | ORAL | Status: AC

## 2011-10-05 NOTE — Progress Notes (Signed)
Subjective: Patient still claims that she is very weak however has been ambulating to the bathroom, not out in the halls yet, does feel hungry and does feel like eating lunch, frequency of urine continues to be an issue but noted active dysuria, no further diarrhea, continues to have a moist cough minimally productive, unable to facilitate going home today because of no help from family with transportation and disposition home  Objective: Vital signs in last 24 hours: Temp:  [97.4 F (36.3 C)-98.3 F (36.8 C)] 97.4 F (36.3 C) (12/15 0506) Pulse Rate:  [82-88] 86  (12/15 0506) Resp:  [18-20] 20  (12/15 0506) BP: (148-153)/(78-94) 148/88 mmHg (12/15 0506) SpO2:  [94 %-98 %] 94 % (12/15 0506) Weight:  [64.638 kg (142 lb 8 oz)] 142 lb 8 oz (64.638 kg) (12/15 0506) Weight change: -1.724 kg (-3 lb 12.8 oz) Last BM Date: 10/01/11  CBG (last 3)   Basename 10/05/11 0535 10/04/11 2358 10/04/11 0549  GLUCAP 117* 199* 134*    Intake/Output from previous day: 12/14 0701 - 12/15 0700 In: 960 [P.O.:360; I.V.:600] Out: 4750 [Urine:4750] Intake/Output this shift: Total I/O In: -  Out: 250 [Urine:250] Physical exam General appearance alert cooperative no apparent distress but some moist cough, feels generally weak Sclera anicteric No oropharyngeal lesions, mucosa moist Cardiovascular exam reveals regular rate and rhythm Abdominal exam reveals soft nontender nondistended abdomen, question discomfort, no suprapubic tenderness Lungs reveal coarse breath sounds and moist cough but otherwise clear Extremities reveal no evidence of cyanosis, edema, pedal pulses intact Neurological exam grossly nonfocal.  Lab Results:  [facility] 10/05/11 0510 10/03/11 0340  NA 139 143  K 4.2 4.3  CL 99 111  CO2 34* 27  GLUCOSE 124* 144*  BUN 10 7  CREATININE 0.49* 0.54  CALCIUM 9.5 8.0*  MG -- --  PHOS -- --    Basename 10/03/11 0340  AST 10  ALT 8  ALKPHOS 53  BILITOT 0.3  PROT 5.7*  ALBUMIN  2.5*    Basename 10/05/11 0510 10/03/11 0340  WBC 12.3* 9.6  NEUTROABS -- --  HGB 11.2* 10.3*  HCT 34.7* 31.6*  MCV 105.5* 106.8*  PLT 304 224   No results found for this basename: CKTOTAL:3,CKMB:3,CKMBINDEX:3,TROPONINI:3 in the last 72 hours No results found for this basename: TSH,T4TOTAL,FREET3,T3FREE,THYROIDAB in the last 72 hours No results found for this basename: VITAMINB12:2,FOLATE:2,FERRITIN:2,TIBC:2,IRON:2,RETICCTPCT:2 in the last 72 hours  Studies/Results: Dg Chest 2 View  10/04/2011  *RADIOLOGY REPORT*  Clinical Data: Coughing for 1 week.  Shortness of breath. Weakness.  CHEST - 2 VIEW  Comparison: 10/02/2011.  Findings: Cardiac silhouette is upper range of normal size. Ectasia and nonaneurysmal calcification of the thoracic aorta are seen.  No pulmonary edema, pneumonia, or right pleural effusion is seen.  The patchy infiltrative density in the right base is no longer definitely evident.  There is slight blunting of the left costophrenic angle laterally and posteriorly unchanged probably reflecting chronic pleural thickening rather than fluid.  There is an overall mild hyperinflation configuration.  There is osteopenic appearance of bones.  Compression fracture of the upper mid thoracic spine area appears stable.  IMPRESSION: No definite pulmonary edema or pneumonia is evident.  Patchy infiltrative densities seen in right base on previous study is no longer definitely visualized.  Overall mild hyperinflation configuration.  Stable appearance of thoracic spine vertebral body compression fracture.  Osteopenic appearance of bones.  Original Report Authenticated By: Crawford Givens, M.D.     Medications: Scheduled:   . ALPRAZolam  1 mg Oral TID  . amitriptyline  50 mg Oral QHS  . buPROPion  75 mg Oral Daily  . enoxaparin  40 mg Subcutaneous Q24H  . feeding supplement  237 mL Oral BID WC  . lipase/protease/amylase  1 capsule Oral TID AC  . methylPREDNISolone (SOLU-MEDROL) injection   60 mg Intravenous Q12H  . mirtazapine  30 mg Oral QHS  . nitrofurantoin (macrocrystal-monohydrate)  100 mg Oral Q12H  . Vitamin D (Ergocalciferol)  50,000 Units Oral Q Sun   Continuous:   . 0.9 % NaCl with KCl 20 mEq / L 50 mL/hr at 10/05/11 0446    Assessment/Plan: Principal Problem:  *UTI (lower urinary tract infection) antibiotic sensitivities have revealed Escherichia coli resistant to many antibiotics, now changed to Macrobid within the last 24 hours given sensitivities, white blood cell count has increased however this is in the setting of use of significant prednisone and Solu-Medrol. Generalized weakness, secondary to current urinary tract infection, advanced osteoarthritis, continues to be somewhat weak complicated by cough if no improvement or unable to facilitate transport and disposition home may need disc and occupational therapy consultation and disposition through social work Cough chest x-ray with evidence of nonspecific changes, currently on antibiotics we'll continue to monitor Hyperglycemia, stable with sliding scale, will taper steroids and hopefully this will improve. Rheumatoid arthritis, will taper steroids, will decrease down to 40 mg tomorrow then taper further to 20 mg the following week     LOS: 4 days   Charistopher Rumble R 10/05/2011, 12:29 PM

## 2011-10-06 ENCOUNTER — Inpatient Hospital Stay (HOSPITAL_COMMUNITY): Payer: Medicare Other

## 2011-10-06 LAB — BASIC METABOLIC PANEL
BUN: 12 mg/dL (ref 6–23)
CO2: 38 mEq/L — ABNORMAL HIGH (ref 19–32)
Calcium: 9.1 mg/dL (ref 8.4–10.5)
Creatinine, Ser: 0.69 mg/dL (ref 0.50–1.10)
GFR calc non Af Amer: 84 mL/min — ABNORMAL LOW (ref >90)
Glucose, Bld: 97 mg/dL (ref 70–99)
Sodium: 142 mEq/L (ref 135–145)

## 2011-10-06 LAB — GLUCOSE, CAPILLARY
Glucose-Capillary: 118 mg/dL — ABNORMAL HIGH (ref 70–99)
Glucose-Capillary: 126 mg/dL — ABNORMAL HIGH (ref 70–99)
Glucose-Capillary: 98 mg/dL (ref 70–99)

## 2011-10-06 LAB — CBC
Hemoglobin: 12.6 g/dL (ref 12.0–15.0)
MCH: 34.1 pg — ABNORMAL HIGH (ref 26.0–34.0)
MCHC: 31.7 g/dL (ref 30.0–36.0)
MCV: 107.3 fL — ABNORMAL HIGH (ref 78.0–100.0)
RBC: 3.7 MIL/uL — ABNORMAL LOW (ref 3.87–5.11)

## 2011-10-06 MED ORDER — PREDNISONE 20 MG PO TABS
20.0000 mg | ORAL_TABLET | Freq: Every day | ORAL | Status: DC
  Administered 2011-10-07 – 2011-10-11 (×5): 20 mg via ORAL
  Filled 2011-10-06 (×5): qty 1

## 2011-10-06 MED ORDER — FUROSEMIDE 20 MG PO TABS
20.0000 mg | ORAL_TABLET | Freq: Once | ORAL | Status: AC
  Administered 2011-10-06: 20 mg via ORAL
  Filled 2011-10-06: qty 1

## 2011-10-06 NOTE — Progress Notes (Signed)
Patient remains with SOB w/exertion. New crackles today at bases with congested nonproductive cough. Oxygen sats WNL on nasal cannula. Results of Chest X-ray noted, Dr Timothy Lasso notified. New orders received. Will monitor.

## 2011-10-06 NOTE — Progress Notes (Signed)
Pt not feeling up to ambulating in the hallway this shift. Pt up frequently in the room. Remains SOB with exertion with frequent coughing. Sats 89-90% when ambulating to bathroom on RA. Sats in upper 90's with 2l oxygen

## 2011-10-06 NOTE — Progress Notes (Signed)
Subjective: Patient still claims that she is very weak however has been ambulating to the bathroom, not out in the halls yet, does feel hungry and does feel like eating lunch, frequency of urine continues to be an issue but noted active dysuria, no further diarrhea, continues to have a moist cough minimally productive, again today unclear whether she can get a ride home and however feels too weak to go home, states that she will be able to go home tomorrow. Claims cough is worse but no respiratory distress noted by staff, afebrile, oxygen levels normal but still on oxygen Objective: Vital signs in last 24 hours: Temp:  [98 F (36.7 C)-98.2 F (36.8 C)] 98 F (36.7 C) (12/16 0431) Pulse Rate:  [90-102] 102  (12/16 0431) Resp:  [20-22] 20  (12/16 0431) BP: (121-125)/(76-87) 121/87 mmHg (12/16 0431) SpO2:  [94 %-99 %] 94 % (12/16 0431) Weight:  [63.1 kg (139 lb 1.8 oz)] 139 lb 1.8 oz (63.1 kg) (12/16 0431) Weight change: -1.538 kg (-3 lb 6.2 oz) Last BM Date: 10/06/11  CBG (last 3)   Basename 10/06/11 1153 10/06/11 0558 10/05/11 2353  GLUCAP 126* 98 118*    Intake/Output from previous day: 12/15 0701 - 12/16 0700 In: 1341.7 [P.O.:240; I.V.:1101.7] Out: 2000 [Urine:2000] Intake/Output this shift:   Physical exam General appearance alert cooperative no apparent distress but some moist cough, feels generally weak Sclera anicteric No oropharyngeal lesions, mucosa moist Cardiovascular exam reveals regular rate and rhythm Abdominal exam reveals soft nontender nondistended abdomen, question discomfort, no suprapubic tenderness Lungs reveal coarse breath sounds and moist cough but now some rhonchi at the bases Extremities reveal no evidence of cyanosis, edema, pedal pulses intact Neurological exam grossly nonfocal.  Lab Results:  [facility] 10/06/11 0530 10/05/11 0510  NA 142 139  K 4.2 4.2  CL 100 99  CO2 38* 34*  GLUCOSE 97 124*  BUN 12 10  CREATININE 0.69 0.49*  CALCIUM 9.1 9.5    MG -- --  PHOS -- --   No results found for this basename: AST:2,ALT:2,ALKPHOS:2,BILITOT:2,PROT:2,ALBUMIN:2 in the last 72 hours  Basename 10/06/11 0530 10/05/11 0510  WBC 14.9* 12.3*  NEUTROABS -- --  HGB 12.6 11.2*  HCT 39.7 34.7*  MCV 107.3* 105.5*  PLT 318 304   No results found for this basename: CKTOTAL:3,CKMB:3,CKMBINDEX:3,TROPONINI:3 in the last 72 hours No results found for this basename: TSH,T4TOTAL,FREET3,T3FREE,THYROIDAB in the last 72 hours No results found for this basename: VITAMINB12:2,FOLATE:2,FERRITIN:2,TIBC:2,IRON:2,RETICCTPCT:2 in the last 72 hours  Studies/Results: No results found.   Medications: Scheduled:    . ALPRAZolam  1 mg Oral TID  . amitriptyline  50 mg Oral QHS  . buPROPion  75 mg Oral Daily  . enoxaparin  40 mg Subcutaneous Q24H  . feeding supplement  237 mL Oral BID WC  . lipase/protease/amylase  1 capsule Oral TID AC  . mirtazapine  30 mg Oral QHS  . nitrofurantoin (macrocrystal-monohydrate)  100 mg Oral Q12H  . predniSONE  40 mg Oral QAC breakfast  . Vitamin D (Ergocalciferol)  50,000 Units Oral Q Sun   Continuous:    . 0.9 % NaCl with KCl 20 mEq / L 50 mL/hr at 10/06/11 1504    Assessment/Plan: Principal Problem:  *UTI (lower urinary tract infection) antibiotic sensitivities have revealed Escherichia coli resistant to many antibiotics, now changed to Macrobid within the last 48 hours given sensitivities, white blood cell count has increased this morning however this is in the setting of use of significant prednisone and  Solu-Medrol. Question relationship to cough see below  Generalized weakness, secondary to current urinary tract infection, advanced osteoarthritis, continues to be somewhat weak complicated by cough if no improvement or unable to facilitate transport and disposition home may need disc and occupational therapy consultation and disposition through social work   Cough chest x-ray from 4 days ago with evidence of  nonspecific changes, currently on antibiotics however now cough worse per patient report, will recheck chest x-ray especially after change in antibiotics.  Hyperglycemia, stable with sliding scale, will taper steroids and this has improved Rheumatoid arthritis, will taper steroids, will decrease down to 40 mg tomorrow then taper further to 20 mg the following week     LOS: 5 days   Billal Rollo R 10/06/2011, 3:11 PM

## 2011-10-07 LAB — BASIC METABOLIC PANEL
Calcium: 9.4 mg/dL (ref 8.4–10.5)
GFR calc Af Amer: >90 mL/min (ref >90)
GFR calc non Af Amer: 86 mL/min — ABNORMAL LOW (ref >90)
Glucose, Bld: 127 mg/dL — ABNORMAL HIGH (ref 70–99)
Potassium: 3.8 mEq/L (ref 3.5–5.1)
Sodium: 141 mEq/L (ref 135–145)

## 2011-10-07 LAB — GLUCOSE, CAPILLARY
Glucose-Capillary: 163 mg/dL — ABNORMAL HIGH (ref 70–99)
Glucose-Capillary: 252 mg/dL — ABNORMAL HIGH (ref 70–99)

## 2011-10-07 LAB — STREP PNEUMONIAE URINARY ANTIGEN: Strep Pneumo Urinary Antigen: NEGATIVE

## 2011-10-07 LAB — PRO B NATRIURETIC PEPTIDE: Pro B Natriuretic peptide (BNP): 1203 pg/mL — ABNORMAL HIGH (ref 0–125)

## 2011-10-07 MED ORDER — BENZONATATE 100 MG PO CAPS
100.0000 mg | ORAL_CAPSULE | Freq: Three times a day (TID) | ORAL | Status: DC
  Administered 2011-10-07 – 2011-10-14 (×22): 100 mg via ORAL
  Filled 2011-10-07 (×24): qty 1

## 2011-10-07 NOTE — Progress Notes (Signed)
Subjective: She continues to have a productive cough with mild dyspnea on exertion and no chest discomfort. She is also globally weak. At this point, she is reluctant to transition to a skilled nursing facility for continued care. Her appetite has been reasonable and her diarrhea has subsided.  Objective: Vital signs in last 24 hours: Temp:  [98.3 F (36.8 C)-98.5 F (36.9 C)] 98.5 F (36.9 C) (12/17 0603) Pulse Rate:  [105-107] 107  (12/17 0603) Resp:  [20] 20  (12/17 0603) BP: (124-152)/(72-90) 142/84 mmHg (12/17 0630) SpO2:  [88 %-98 %] 94 % (12/17 0631) Weight:  [61.8 kg (136 lb 3.9 oz)] 136 lb 3.9 oz (61.8 kg) (12/17 0411) Weight change: -1.3 kg (-2 lb 13.9 oz)   Intake/Output from previous day: 12/16 0701 - 12/17 0700 In: 340 [P.O.:240; I.V.:100] Out: 1500 [Urine:1500]   General appearance: alert, cooperative and With frequent cough and no shortness of breath at rest Resp: rales She had bilateral scattered wheezing without use of secondary muscles of respiration, wheezes Bilaterally and  Cardio: regular rate and rhythm, S1, S2 normal, no murmur, click, rub or gallop GI: soft, non-tender; bowel sounds normal; no masses,  no organomegaly Extremities: extremities normal, atraumatic, no cyanosis or edema  Lab Results:  Basename 10/06/11 0530 10/05/11 0510  WBC 14.9* 12.3*  HGB 12.6 11.2*  HCT 39.7 34.7*  PLT 318 304   BMET  Basename 10/07/11 0435 10/06/11 0530  NA 141 142  K 3.8 4.2  CL 100 100  CO2 36* 38*  GLUCOSE 127* 97  BUN 14 12  CREATININE 0.64 0.69  CALCIUM 9.4 9.1   CMET CMP     Component Value Date/Time   NA 141 10/07/2011 0435   K 3.8 10/07/2011 0435   CL 100 10/07/2011 0435   CO2 36* 10/07/2011 0435   GLUCOSE 127* 10/07/2011 0435   BUN 14 10/07/2011 0435   CREATININE 0.64 10/07/2011 0435   CALCIUM 9.4 10/07/2011 0435   PROT 5.7* 10/03/2011 0340   ALBUMIN 2.5* 10/03/2011 0340   AST 10 10/03/2011 0340   ALT 8 10/03/2011 0340   ALKPHOS 53  10/03/2011 0340   BILITOT 0.3 10/03/2011 0340   GFRNONAA 86* 10/07/2011 0435   GFRAA >90 10/07/2011 0435    CBG (last 3)   Basename 10/07/11 0554 10/06/11 2349 10/06/11 1807  GLUCAP 127* 163* 149*    INR RESULTS:   Lab Results  Component Value Date   INR 0.87 09/16/2011   INR 2.6* 05/12/2008   INR 2.3* 05/11/2008     Studies/Results: Dg Chest 2 View  10/06/2011  *RADIOLOGY REPORT*  Clinical Data: Worsening cough.  Hypoxia.  Congestion.  Shortness of breath.  CHEST - 2 VIEW  Comparison: 10/04/2011  Findings: Heart size is normal.  There are bilateral pleural effusions.  There is bibasilar atelectasis.  No definite consolidations are identified.  No evidence for pulmonary edema. Mild degenerative changes are seen in the thoracic spine.  There is a wedge compression fracture of an upper thoracic level, likely T6. Surgical clips are identified in the upper abdomen.  IMPRESSION:  1.  Bilateral pleural effusions and bibasilar atelectasis. 2.  No evidence for pulmonary edema. 3.  T6 compression fracture, stable since previous studies.  Original Report Authenticated By: Patterson Hammersmith, M.D.    Medications: I have reviewed the patient's current medications.  Assessment/Plan: #1 Urinary Tract Infection: stable on Macrobid for a resistant Escherichia coli infection #2 Cough: Of uncertain cause. She could have a viral  syndrome causing rhinitis with cough for mild congestive heart failure or early pneumonia. We will check a BNP test as well as a echocardiogram and adjust medications accordingly. In addition, I've added Tessalon 100 mg 3 times a day to her regimen. #3 Physical Deconditioning: Moderately severe, and she does live by herself with advanced arthritis. She may need brief short-term skilled nursing facility rehabilitation. I will request a social work consult to initiate an [place]-2 form. #4 Adrenal Insufficiency: Stable on current dose of prednisone.  LOS: 6 days   Nekia Maxham  G 10/07/2011, 7:49 AM

## 2011-10-07 NOTE — Progress Notes (Signed)
  Echocardiogram 2D Echocardiogram has been performed.  Juanita Laster Horris Speros, RDCS 10/07/2011, 11:23 AM

## 2011-10-07 NOTE — Progress Notes (Signed)
CSW spoke with pt and completed psychosocial assessment (pls see shadow chart). Pt lives at home alone and has support from her children. Pt is refusing SNF at this time for short term rehab. Pt discussed her previous stay at a facility and in which she developed a staph infection and her entire stay was not covered by her insurance. CSW explained that the pt would have a choice of facilities, yet the pt still refused. Pt was agreeable to [facility] at discharge, CSW has called and left a voicemail with CM alerting them that the pt will most likely need HH at discharge. CSW signing off at this time, please re-consult if needed.  Golden Pop 10/07/2011 2:33 PM [phone]

## 2011-10-08 LAB — CULTURE, BLOOD (ROUTINE X 2)
Culture  Setup Time: 201212120137
Culture: NO GROWTH

## 2011-10-08 LAB — CBC
HCT: 34.9 % — ABNORMAL LOW (ref 36.0–46.0)
MCV: 106.1 fL — ABNORMAL HIGH (ref 78.0–100.0)
RBC: 3.29 MIL/uL — ABNORMAL LOW (ref 3.87–5.11)
WBC: 9.2 10*3/uL (ref 4.0–10.5)

## 2011-10-08 LAB — GLUCOSE, CAPILLARY
Glucose-Capillary: 128 mg/dL — ABNORMAL HIGH (ref 70–99)
Glucose-Capillary: 99 mg/dL (ref 70–99)

## 2011-10-08 MED ORDER — OLMESARTAN MEDOXOMIL 20 MG PO TABS
20.0000 mg | ORAL_TABLET | Freq: Every day | ORAL | Status: DC
  Administered 2011-10-08: 20 mg via ORAL
  Filled 2011-10-08 (×2): qty 1

## 2011-10-08 MED ORDER — FUROSEMIDE 40 MG PO TABS
40.0000 mg | ORAL_TABLET | Freq: Every day | ORAL | Status: DC
  Administered 2011-10-08: 40 mg via ORAL
  Filled 2011-10-08 (×2): qty 1

## 2011-10-08 MED ORDER — ALBUTEROL SULFATE (5 MG/ML) 0.5% IN NEBU
2.5000 mg | INHALATION_SOLUTION | Freq: Four times a day (QID) | RESPIRATORY_TRACT | Status: DC
  Administered 2011-10-08 – 2011-10-09 (×5): 2.5 mg via RESPIRATORY_TRACT
  Filled 2011-10-08 (×5): qty 0.5

## 2011-10-08 MED ORDER — BISMUTH SUBSALICYLATE 262 MG/15ML PO SUSP
30.0000 mL | ORAL | Status: DC | PRN
  Filled 2011-10-08: qty 236

## 2011-10-08 NOTE — Progress Notes (Signed)
Subjective: Cough has increased and is not productive, no SOB at rest, eating well, no diarrhea.  Objective: Vital signs in last 24 hours: Temp:  [97.5 F (36.4 C)-98.9 F (37.2 C)] 97.5 F (36.4 C) (12/18 0458) Pulse Rate:  [87-123] 87  (12/18 0458) Resp:  [16-20] 16  (12/18 0458) BP: (98-116)/(68-81) 116/77 mmHg (12/18 0458) SpO2:  [92 %-98 %] 95 % (12/18 0458) Weight:  [61.9 kg (136 lb 7.4 oz)] 136 lb 7.4 oz (61.9 kg) (12/18 0458) Weight change: 0.1 kg (3.5 oz)   Intake/Output from previous day: 12/17 0701 - 12/18 0700 In: 240 [P.O.:240] Out: 900 [Urine:900]   General appearance: alert, cooperative and frequent dry cough, no dyspnea Resp: wheezes diffuse Cardio: regular rate and rhythm with a 2/6 SEM Extremities: extremities normal, atraumatic, no cyanosis or edema Neurologic: Grossly normal  Lab Results:  Basename 10/08/11 0453 10/06/11 0530  WBC 9.2 14.9*  HGB 11.7* 12.6  HCT 34.9* 39.7  PLT 292 318   BMET  Basename 10/07/11 0435 10/06/11 0530  NA 141 142  K 3.8 4.2  CL 100 100  CO2 36* 38*  GLUCOSE 127* 97  BUN 14 12  CREATININE 0.64 0.69  CALCIUM 9.4 9.1   CMET CMP     Component Value Date/Time   NA 141 10/07/2011 0435   K 3.8 10/07/2011 0435   CL 100 10/07/2011 0435   CO2 36* 10/07/2011 0435   GLUCOSE 127* 10/07/2011 0435   BUN 14 10/07/2011 0435   CREATININE 0.64 10/07/2011 0435   CALCIUM 9.4 10/07/2011 0435   PROT 5.7* 10/03/2011 0340   ALBUMIN 2.5* 10/03/2011 0340   AST 10 10/03/2011 0340   ALT 8 10/03/2011 0340   ALKPHOS 53 10/03/2011 0340   BILITOT 0.3 10/03/2011 0340   GFRNONAA 86* 10/07/2011 0435   GFRAA >90 10/07/2011 0435    CBG (last 3)   Basename 10/08/11 0622 10/08/11 0012 10/07/11 1144  GLUCAP 99 128* 252*    INR RESULTS:   Lab Results  Component Value Date   INR 0.87 09/16/2011   INR 2.6* 05/12/2008   INR 2.3* 05/11/2008     Studies/Results: Dg Chest 2 View  10/06/2011  *RADIOLOGY REPORT*  Clinical Data:  Worsening cough.  Hypoxia.  Congestion.  Shortness of breath.  CHEST - 2 VIEW  Comparison: 10/04/2011  Findings: Heart size is normal.  There are bilateral pleural effusions.  There is bibasilar atelectasis.  No definite consolidations are identified.  No evidence for pulmonary edema. Mild degenerative changes are seen in the thoracic spine.  There is a wedge compression fracture of an upper thoracic level, likely T6. Surgical clips are identified in the upper abdomen.  IMPRESSION:  1.  Bilateral pleural effusions and bibasilar atelectasis. 2.  No evidence for pulmonary edema. 3.  T6 compression fracture, stable since previous studies.  Original Report Authenticated By: Patterson Hammersmith, M.D.    Medications: I have reviewed the patient's current medications.  Assessment/Plan: #1 Dyspnea and Cough:  Most likely from a viral URI versus mild CHF. Will increase albuterol nebs, add Benicar and lasix to regimen. #2 Anemia: mild and stable. #3 Adrenal Insufficiency: stable on prednisone 20 qd.  LOS: 7 days   Eunique Balik G 10/08/2011, 8:52 AM

## 2011-10-08 NOTE — Progress Notes (Signed)
Nutrition Follow-up  Diet Order:  Regular  Meds: Scheduled Meds:   . albuterol  2.5 mg Nebulization Q6H  . ALPRAZolam  1 mg Oral TID  . amitriptyline  50 mg Oral QHS  . benzonatate  100 mg Oral TID  . buPROPion  75 mg Oral Daily  . enoxaparin  40 mg Subcutaneous Q24H  . feeding supplement  237 mL Oral BID WC  . furosemide  40 mg Oral Daily  . lipase/protease/amylase  1 capsule Oral TID AC  . mirtazapine  30 mg Oral QHS  . nitrofurantoin (macrocrystal-monohydrate)  100 mg Oral Q12H  . olmesartan  20 mg Oral Daily  . predniSONE  20 mg Oral QAC breakfast  . Vitamin D (Ergocalciferol)  50,000 Units Oral Q Sun   Continuous Infusions:   . 0.9 % NaCl with KCl 20 mEq / L 20 mL/hr (10/06/11 2205)   PRN Meds:.acetaminophen, acetaminophen, albuterol, alum & mag hydroxide-simeth, guaiFENesin-dextromethorphan, HYDROcodone-acetaminophen, ondansetron (ZOFRAN) IV, ondansetron, polyvinyl alcohol  Labs:  CMP     Component Value Date/Time   NA 141 10/07/2011 0435   K 3.8 10/07/2011 0435   CL 100 10/07/2011 0435   CO2 36* 10/07/2011 0435   GLUCOSE 127* 10/07/2011 0435   BUN 14 10/07/2011 0435   CREATININE 0.64 10/07/2011 0435   CALCIUM 9.4 10/07/2011 0435   PROT 5.7* 10/03/2011 0340   ALBUMIN 2.5* 10/03/2011 0340   AST 10 10/03/2011 0340   ALT 8 10/03/2011 0340   ALKPHOS 53 10/03/2011 0340   BILITOT 0.3 10/03/2011 0340   GFRNONAA 86* 10/07/2011 0435   GFRAA >90 10/07/2011 0435     Intake/Output Summary (Last 24 hours) at 10/08/11 1514 Last data filed at 10/08/11 1000  Gross per 24 hour  Intake    720 ml  Output   1250 ml  Net   -530 ml    Weight Status:   Admission wt:  142 lbs Current wt;  136 lbs, a 4% loss since admission with O>> on lasix  Nutrition Dx:  Altered GI function, resolving.  Pt tolerating a regular diet using Creon at meals, diarrhea decreased. No s/s aspiration.  Intervention:   Continue current management.  Monitor:   1.  Food/Beverage; diet  advancement, pt consuming >50% of meals.  Met, continue.   Hoyt Koch Pager #:  [phone]

## 2011-10-09 ENCOUNTER — Inpatient Hospital Stay (HOSPITAL_COMMUNITY): Payer: Medicare Other

## 2011-10-09 ENCOUNTER — Other Ambulatory Visit: Payer: Self-pay

## 2011-10-09 LAB — BASIC METABOLIC PANEL
Calcium: 9.4 mg/dL (ref 8.4–10.5)
Creatinine, Ser: 1.01 mg/dL (ref 0.50–1.10)
GFR calc non Af Amer: 53 mL/min — ABNORMAL LOW (ref >90)
Glucose, Bld: 142 mg/dL — ABNORMAL HIGH (ref 70–99)
Sodium: 135 mEq/L (ref 135–145)

## 2011-10-09 LAB — PRO B NATRIURETIC PEPTIDE: Pro B Natriuretic peptide (BNP): 582.4 pg/mL — ABNORMAL HIGH (ref 0–125)

## 2011-10-09 LAB — GLUCOSE, CAPILLARY: Glucose-Capillary: 131 mg/dL — ABNORMAL HIGH (ref 70–99)

## 2011-10-09 MED ORDER — METOPROLOL TARTRATE 1 MG/ML IV SOLN
2.5000 mg | INTRAVENOUS | Status: DC | PRN
  Filled 2011-10-09: qty 5

## 2011-10-09 MED ORDER — METOPROLOL TARTRATE 1 MG/ML IV SOLN
5.0000 mg | Freq: Once | INTRAVENOUS | Status: AC
  Administered 2011-10-09: 5 mg via INTRAVENOUS

## 2011-10-09 NOTE — Progress Notes (Signed)
Pt's HR up to 150's, sustained for a period of time.  Notified Westley Hummer, NP and ordered Metoprolol 5 mg IV once and Metoprolol 2.5 mg IV PRN if parameters were not met with initial dose.  Performed EKG as well which showed ST with left ventricular hypertrophy with repolarization abnormality.

## 2011-10-09 NOTE — Progress Notes (Signed)
Subjective: She feels weak today, and continues to have a dry cough, no dyspnea, no diarrhea. Has left sided rib pain.  Objective: Vital signs in last 24 hours: Temp:  [98.4 F (36.9 C)-99.4 F (37.4 C)] 99.4 F (37.4 C) (12/19 0608) Pulse Rate:  [115-147] 115  (12/19 0608) Resp:  [16] 16  (12/19 0608) BP: (90-117)/(60-73) 90/60 mmHg (12/19 0608) SpO2:  [87 %-98 %] 93 % (12/19 0608) Weight:  [60.4 kg (133 lb 2.5 oz)] 133 lb 2.5 oz (60.4 kg) (12/19 4782) Weight change: -1.5 kg (-3 lb 4.9 oz)   Intake/Output from previous day: 12/18 0701 - 12/19 0700 In: 1379.3 [P.O.:840; I.V.:539.3] Out: 675 [Urine:675]   General appearance: alert, cooperative and with frequent dry cough Resp: clear to auscultation bilaterally Cardio: regular rate and rhythm, S1, S2 normal, no murmur, click, rub or gallop Extremities: extremities normal, atraumatic, no cyanosis or edema  Lab Results:  [facility] 10/08/11 0453  WBC 9.2  HGB 11.7*  HCT 34.9*  PLT 292   BMET  Basename 10/09/11 0512 10/07/11 0435  NA 135 141  K 3.9 3.8  CL 95* 100  CO2 34* 36*  GLUCOSE 142* 127*  BUN 16 14  CREATININE 1.01 0.64  CALCIUM 9.4 9.4   CMET CMP     Component Value Date/Time   NA 135 10/09/2011 0512   K 3.9 10/09/2011 0512   CL 95* 10/09/2011 0512   CO2 34* 10/09/2011 0512   GLUCOSE 142* 10/09/2011 0512   BUN 16 10/09/2011 0512   CREATININE 1.01 10/09/2011 0512   CALCIUM 9.4 10/09/2011 0512   PROT 5.7* 10/03/2011 0340   ALBUMIN 2.5* 10/03/2011 0340   AST 10 10/03/2011 0340   ALT 8 10/03/2011 0340   ALKPHOS 53 10/03/2011 0340   BILITOT 0.3 10/03/2011 0340   GFRNONAA 53* 10/09/2011 0512   GFRAA 62* 10/09/2011 0512    CBG (last 3)   Basename 10/09/11 0604 10/08/11 1214 10/08/11 0622  GLUCAP 131* 178* 99    INR RESULTS:   Lab Results  Component Value Date   INR 0.87 09/16/2011   INR 2.6* 05/12/2008   INR 2.3* 05/11/2008     Studies/Results: No results found.  Medications: I have  reviewed the patient's current medications.  Assessment/Plan: #1 Cough:  Stable with improvement in wheezing. Left lateral chest pain is pleuritic and could be due to rib fx. Will recheck Chest X-ray and rib X-rays today. #2 Hypotension:  Likely due to treatment with lasix and benicar. Will d/c these meds and increase IVF today.  LOS: 8 days   Brad Lieurance G 10/09/2011, 7:38 AM

## 2011-10-09 NOTE — Progress Notes (Signed)
Pt's HR dropped down to 114-120's after administration of Metoprolol 5 mg IV.  Looked at vital sign hx and pt's HR stays around 100's.

## 2011-10-10 ENCOUNTER — Other Ambulatory Visit: Payer: Self-pay

## 2011-10-10 LAB — CBC
HCT: 30.8 % — ABNORMAL LOW (ref 36.0–46.0)
Hemoglobin: 10.1 g/dL — ABNORMAL LOW (ref 12.0–15.0)
MCV: 105.5 fL — ABNORMAL HIGH (ref 78.0–100.0)
RDW: 14.1 % (ref 11.5–15.5)
WBC: 23 10*3/uL — ABNORMAL HIGH (ref 4.0–10.5)

## 2011-10-10 LAB — GLUCOSE, CAPILLARY
Glucose-Capillary: 115 mg/dL — ABNORMAL HIGH (ref 70–99)
Glucose-Capillary: 135 mg/dL — ABNORMAL HIGH (ref 70–99)
Glucose-Capillary: 174 mg/dL — ABNORMAL HIGH (ref 70–99)

## 2011-10-10 MED ORDER — VANCOMYCIN HCL 500 MG IV SOLR
500.0000 mg | Freq: Two times a day (BID) | INTRAVENOUS | Status: DC
  Administered 2011-10-10 – 2011-10-11 (×3): 500 mg via INTRAVENOUS
  Filled 2011-10-10 (×6): qty 500

## 2011-10-10 MED ORDER — METOPROLOL TARTRATE 1 MG/ML IV SOLN
2.5000 mg | Freq: Once | INTRAVENOUS | Status: AC
  Administered 2011-10-10: 2.5 mg via INTRAVENOUS

## 2011-10-10 MED ORDER — SODIUM CHLORIDE 0.9 % IJ SOLN
10.0000 mL | INTRAMUSCULAR | Status: DC | PRN
  Administered 2011-10-11 – 2011-10-12 (×2): 10 mL

## 2011-10-10 MED ORDER — DEXTROSE 5 % IV SOLN
1.0000 g | INTRAVENOUS | Status: DC
  Administered 2011-10-10 – 2011-10-14 (×5): 1 g via INTRAVENOUS
  Filled 2011-10-10 (×5): qty 10

## 2011-10-10 NOTE — Progress Notes (Signed)
ANTIBIOTIC CONSULT NOTE - INITIAL  Pharmacy Consult for Vancomycin Indication: rule out pneumonia  Allergies  Allergen Reactions  . Penicillins Hives    Patient Measurements: Height: 5\' 3"  (160 cm) Weight: 134 lb 4.2 oz (60.9 kg) IBW/kg (Calculated) : 52.4   Vital Signs: Temp: 99.1 F (37.3 C) (12/20 0455) Temp src: Oral (12/20 0455) BP: 109/69 mmHg (12/20 0455) Pulse Rate: 117  (12/20 0455) Intake/Output from previous day: 12/19 0701 - 12/20 0700 In: 2180 [P.O.:480; I.V.:1700] Out: 1300 [Urine:1300] Intake/Output from this shift: Total I/O In: -  Out: 150 [Urine:150]  Labs:  [facility] 10/10/11 0440 10/09/11 0512 10/08/11 0453  WBC 23.0* -- 9.2  HGB 10.1* -- 11.7*  PLT 244 -- 292  LABCREA -- -- --  CREATININE -- 1.01 --   Estimated Creatinine Clearance: 40.4 ml/min (by C-G formula based on Cr of 1.01). No results found for this basename: VANCOTROUGH:2,VANCOPEAK:2,VANCORANDOM:2,GENTTROUGH:2,GENTPEAK:2,GENTRANDOM:2,TOBRATROUGH:2,TOBRAPEAK:2,TOBRARND:2,AMIKACINPEAK:2,AMIKACINTROU:2,AMIKACIN:2, in the last 72 hours   Microbiology: Recent Results (from the past 720 hour(s))  URINE CULTURE     Status: Normal   Collection Time   10/01/11  4:53 PM      Component Value Range Status Comment   Specimen Description URINE, CATHETERIZED   Final    Special Requests Immunocompromised   Final    Setup Time 201212120203   Final    Colony Count >=100,000 COLONIES/ML   Final    Culture     Final    Value: ESCHERICHIA COLI     Note: Confirmed Extended Spectrum Beta-Lactamase Producer (ESBL)     Note: CRITICAL RESULT CALLED TO, READ BACK BY AND VERIFIED WITH: Adelfa Koh 12/13 AT 2306 BY DUNNJ   Report Status 10/03/2011 FINAL   Final    Organism ID, Bacteria ESCHERICHIA COLI   Final   CULTURE, BLOOD (ROUTINE X 2)     Status: Normal   Collection Time   10/01/11  7:28 PM      Component Value Range Status Comment   Specimen Description BLOOD RIGHT FOREARM   Final    Special  Requests BOTTLES DRAWN AEROBIC AND ANAEROBIC 5 CC EACH   Final    Setup Time 201212120137   Final    Culture NO GROWTH 5 DAYS   Final    Report Status 10/08/2011 FINAL   Final   CULTURE, BLOOD (ROUTINE X 2)     Status: Normal   Collection Time   10/01/11  7:28 PM      Component Value Range Status Comment   Specimen Description BLOOD LEFT HAND   Final    Special Requests BOTTLES DRAWN AEROBIC AND ANAEROBIC 5 CC EACH   Final    Setup Time 201212120137   Final    Culture NO GROWTH 5 DAYS   Final    Report Status 10/08/2011 FINAL   Final   CULTURE, SPUTUM-ASSESSMENT     Status: Normal   Collection Time   10/07/11  6:44 AM      Component Value Range Status Comment   Specimen Description SPUTUM   Final    Special Requests NONE   Final    Sputum evaluation     Final    Value: MICROSCOPIC FINDINGS SUGGEST THAT THIS SPECIMEN IS NOT REPRESENTATIVE OF LOWER RESPIRATORY SECRETIONS. PLEASE RECOLLECT.     NOTIFIED WILLIAMS,A. RN AT [phone] ON 10/07/11 BY GILLESPIE,B.   Report Status 10/07/2011 FINAL   Final     Medical History: Past Medical History  Diagnosis Date  . Osteoporosis   .  Rheumatoid arthritis   . Murmur, cardiac     Medications:  Scheduled:    . ALPRAZolam  1 mg Oral TID  . amitriptyline  50 mg Oral QHS  . benzonatate  100 mg Oral TID  . buPROPion  75 mg Oral Daily  . cefTRIAXone (ROCEPHIN)  IV  1 g Intravenous Q24H  . enoxaparin  40 mg Subcutaneous Q24H  . feeding supplement  237 mL Oral BID WC  . lipase/protease/amylase  1 capsule Oral TID AC  . metoprolol  2.5 mg Intravenous Once  . mirtazapine  30 mg Oral QHS  . predniSONE  20 mg Oral QAC breakfast  . Vitamin D (Ergocalciferol)  50,000 Units Oral Q Sun  . DISCONTD: albuterol  2.5 mg Nebulization Q6H  . DISCONTD: nitrofurantoin (macrocrystal-monohydrate)  100 mg Oral Q12H   Assessment: 76YOF to begin empiric antibiotics with vancomycin and ceftriaxone for suspected pneumonia. Pt's CXR shows development of infiltrate  in left lower lobe.  WBC dramatically increased to 23, Tmax 99.1, SCr 1.01 (increased from baseline ~0.5). Estimated Creatinine Clearance: 40.4 ml/min (by C-G formula based on Cr of 1.01).  Received 6 days of nitrofurantion for Ecoli UTI (discontinued today).  Goal of Therapy:  Vancomycin trough level 15-20 mcg/ml  Plan:  Vancomycin 500mg  IV q12h. Follow up SCr for dose adjustments.  Clance Boll 10/10/2011,7:48 AM

## 2011-10-10 NOTE — Progress Notes (Signed)
Subjective: Continues to have dry cough, mild dyspnea with any activity, no diarrhea.  Objective: Vital signs in last 24 hours: Temp:  [98.5 F (36.9 C)-99.1 F (37.3 C)] 99.1 F (37.3 C) (12/20 0455) Pulse Rate:  [109-145] 117  (12/20 0455) Resp:  [18-20] 19  (12/20 0455) BP: (95-119)/(58-73) 109/69 mmHg (12/20 0455) SpO2:  [88 %-95 %] 95 % (12/20 0455) Weight:  [60.9 kg (134 lb 4.2 oz)] 134 lb 4.2 oz (60.9 kg) (12/20 0455) Weight change: 0.5 kg (1 lb 1.6 oz)   Intake/Output from previous day: 12/19 0701 - 12/20 0700 In: 2180 [P.O.:480; I.V.:1700] Out: 1300 [Urine:1300]   General appearance: alert, cooperative, mild distress and frequent dry cough with conversation Resp: bibasilar crackles (left more than right) Cardio: regular rate and rhythm, S1, S2 normal, no murmur, click, rub or gallop Extremities: extremities normal, atraumatic, no cyanosis or edema Neurologic: Grossly normal  Lab Results:  Basename 10/10/11 0440 10/08/11 0453  WBC 23.0* 9.2  HGB 10.1* 11.7*  HCT 30.8* 34.9*  PLT 244 292   BMET  Basename 10/09/11 0512  NA 135  K 3.9  CL 95*  CO2 34*  GLUCOSE 142*  BUN 16  CREATININE 1.01  CALCIUM 9.4   CMET CMP     Component Value Date/Time   NA 135 10/09/2011 0512   K 3.9 10/09/2011 0512   CL 95* 10/09/2011 0512   CO2 34* 10/09/2011 0512   GLUCOSE 142* 10/09/2011 0512   BUN 16 10/09/2011 0512   CREATININE 1.01 10/09/2011 0512   CALCIUM 9.4 10/09/2011 0512   PROT 5.7* 10/03/2011 0340   ALBUMIN 2.5* 10/03/2011 0340   AST 10 10/03/2011 0340   ALT 8 10/03/2011 0340   ALKPHOS 53 10/03/2011 0340   BILITOT 0.3 10/03/2011 0340   GFRNONAA 53* 10/09/2011 0512   GFRAA 62* 10/09/2011 0512    CBG (last 3)   Basename 10/10/11 0608 10/09/11 2355 10/09/11 1719  GLUCAP 115* 135* 208*    INR RESULTS:   Lab Results  Component Value Date   INR 0.87 09/16/2011   INR 2.6* 05/12/2008   INR 2.3* 05/11/2008     Studies/Results: Dg Chest 2  View  10/09/2011  *RADIOLOGY REPORT*  Clinical Data: Cough and left sided chest pain.  CHEST - 2 VIEW  Comparison: 10/06/2011  Findings:  There is a new area of consolidation and atelectasis at the left lung base.  Right lung is clear.  Heart size and vascularity are normal. Persistent tiny effusions.  IMPRESSION: New area of consolidation and atelectasis at the left lung base.  Original Report Authenticated By: Gwynn Burly, M.D.   Dg Ribs Unilateral Left  10/09/2011  *RADIOLOGY REPORT*  Clinical Data: Left sided pleuritic chest pain.  LEFT RIBS - 2 VIEW  Comparison: Chest x-ray dated 10/06/2011  Findings: There is no visible left rib fracture.  The patient has increased atelectasis at the left lung base laterally with an area of either loculated fluid or consolidation.  This may be the cause of the patient's pleuritic pain.  IMPRESSION: New area of infiltrate/ atelectasis or loculated fluid at the left base laterally.  No rib fractures.  Original Report Authenticated By: Gwynn Burly, M.D.    Medications: I have reviewed the patient's current medications.  Assessment/Plan: #1  Pneumonia:  Interval development of infiltrate in the left lower lobe. We will start Vancomycin and Rocephin for pneumonia and stop macrobid. Will also request PICC line. I discussed with her that she will  need SNF rehab and that I would see if we could get her into the [facility] early next week.  #2 Adrenal Insufficiency:  Stable on prednisone.  LOS: 9 days   [name] 10/10/2011, 7:26 AM

## 2011-10-10 NOTE — Progress Notes (Signed)
Inpatient Diabetes Program Recommendations  AACE/ADA: New Consensus Statement on Inpatient Glycemic Control (2009)  Target Ranges:  Prepandial:   less than 140 mg/dL      Peak postprandial:   less than 180 mg/dL (1-2 hours)      Critically ill patients:  140 - 180 mg/dL   Reason for Visit: CBG's greater than 200 mg/dL.  Pt. Currently on no insulin coverage.  Inpatient Diabetes Program Recommendations Correction (SSI): Consider changing CBG schedule to tid ac and HS.  Add sensitive Novolog correction. Diet: While on Solumedrol, would benefit from Carb Mod diet as well

## 2011-10-10 NOTE — Progress Notes (Signed)
D: Pt HR elevated to 145 on telemetry monitor. HR sustains at 145.  A: Vital Signs obtained and pt assessed.  Pt asleep and BP 117/73, HR 145.  Dr. Felipa Eth (on call) notified. R: orders received. Maeola Harman

## 2011-10-11 LAB — GLUCOSE, CAPILLARY
Glucose-Capillary: 87 mg/dL (ref 70–99)
Glucose-Capillary: 165 mg/dL — ABNORMAL HIGH (ref 70–99)

## 2011-10-11 LAB — BASIC METABOLIC PANEL
CO2: 32 mEq/L (ref 19–32)
Calcium: 9.3 mg/dL (ref 8.4–10.5)
Chloride: 101 mEq/L (ref 96–112)
Glucose, Bld: 96 mg/dL (ref 70–99)
Sodium: 139 mEq/L (ref 135–145)

## 2011-10-11 MED ORDER — PREDNISONE 50 MG PO TABS
50.0000 mg | ORAL_TABLET | Freq: Every day | ORAL | Status: DC
  Administered 2011-10-11 – 2011-10-12 (×2): 50 mg via ORAL
  Filled 2011-10-11 (×2): qty 1

## 2011-10-11 MED ORDER — ENSURE IMMUNE HEALTH PO LIQD
237.0000 mL | Freq: Three times a day (TID) | ORAL | Status: DC
  Administered 2011-10-11: 237 mL via ORAL

## 2011-10-11 MED ORDER — VANCOMYCIN HCL 500 MG IV SOLR
500.0000 mg | Freq: Two times a day (BID) | INTRAVENOUS | Status: DC
  Administered 2011-10-12: 500 mg via INTRAVENOUS
  Filled 2011-10-11 (×3): qty 500

## 2011-10-11 NOTE — Progress Notes (Signed)
Subjective: Today she feels a bit better with somewhat less cough and a bit more energy.  Her cough is nonproductive and she does not have shortness of breath at rest, but has some shortness of breath with walking to the bathroom.  She has not had abdominal pain or diarrhea and has been eating some food.  We discussed probable eventual transfer to a skilled nursing facility for completion of antibiotic treatment and treatment of physical deconditioning yesterday, which she reluctantly agrees to.  Objective: Vital signs in last 24 hours: Temp:  [97.2 F (36.2 C)-98.9 F (37.2 C)] 97.5 F (36.4 C) (12/21 0554) Pulse Rate:  [86-95] 87  (12/21 0554) Resp:  [18-22] 18  (12/21 0554) BP: (88-98)/(52-62) 96/52 mmHg (12/21 0554) SpO2:  [95 %-98 %] 98 % (12/20 2225) Weight:  [60.918 kg (134 lb 4.8 oz)] 134 lb 4.8 oz (60.918 kg) (12/21 0554) Weight change: 0.018 kg (0.6 oz)   Intake/Output from previous day: 12/20 0701 - 12/21 0700 In: 1080 [P.O.:840; I.V.:240] Out: 1675 [Urine:1675]   General appearance: alert, cooperative and frequent dry cough Resp: bibasilar crackles Cardio: regular rate and rhythm, S1, S2 normal, no murmur, click, rub or gallop GI: soft, non-tender; bowel sounds normal; no masses,  no organomegaly Extremities: extremities normal, atraumatic, no cyanosis or edema  Lab Results:  Basename 10/10/11 0440  WBC 23.0*  HGB 10.1*  HCT 30.8*  PLT 244   BMET  Basename 10/11/11 0450 10/09/11 0512  NA 139 135  K 4.0 3.9  CL 101 95*  CO2 32 34*  GLUCOSE 96 142*  BUN 15 16  CREATININE 0.61 1.01  CALCIUM 9.3 9.4   CMET CMP     Component Value Date/Time   NA 139 10/11/2011 0450   K 4.0 10/11/2011 0450   CL 101 10/11/2011 0450   CO2 32 10/11/2011 0450   GLUCOSE 96 10/11/2011 0450   BUN 15 10/11/2011 0450   CREATININE 0.61 10/11/2011 0450   CALCIUM 9.3 10/11/2011 0450   PROT 5.7* 10/03/2011 0340   ALBUMIN 2.5* 10/03/2011 0340   AST 10 10/03/2011 0340   ALT 8  10/03/2011 0340   ALKPHOS 53 10/03/2011 0340   BILITOT 0.3 10/03/2011 0340   GFRNONAA 87* 10/11/2011 0450   GFRAA >90 10/11/2011 0450    CBG (last 3)   Basename 10/11/11 0759 10/10/11 2051 10/10/11 1242  GLUCAP 87 174* 221*    INR RESULTS:   Lab Results  Component Value Date   INR 0.87 09/16/2011   INR 2.6* 05/12/2008   INR 2.3* 05/11/2008     Studies/Results: No results found.  Medications: I have reviewed the patient's current medications.  Assessment/Plan: #1 left lower lobe Pneumonia: Improving with broad-spectrum antibiotics.  We will recheck a CBC tomorrow. #2 Adrenal Insufficiency: stable on prednisone 20 mg daily #3 Physical Deconditioning: Moderately severe, and we'll plan on brief rehabilitation in a skilled nursing facility with possible transfer on December 24.  LOS: 10 days   Gavon Majano G 10/11/2011, 1:42 PM

## 2011-10-11 NOTE — Progress Notes (Signed)
Pt's BP 86/58.  Pt is asymptomatic.  MD notified.  Awaiting orders.  Will continue to monitor.

## 2011-10-11 NOTE — Progress Notes (Signed)
ANTIBIOTIC CONSULT NOTE - FOLLOW UP  Pharmacy Consult for Vancomycin Indication: Pneumonia  Allergies  Allergen Reactions  . Penicillins Hives    Patient Measurements: Height: 5\' 3"  (160 cm) Weight: 134 lb 4.8 oz (60.918 kg) IBW/kg (Calculated) : 52.4  Adjusted Body Weight:   Vital Signs: Temp: 98 F (36.7 C) (12/21 1419) Temp src: Oral (12/21 1419) BP: 86/58 mmHg (12/21 1430) Pulse Rate: 108  (12/21 1419) Intake/Output from previous day: 12/20 0701 - 12/21 0700 In: 1080 [P.O.:840; I.V.:240] Out: 1675 [Urine:1675] Intake/Output from this shift:    Labs:  Basename 10/11/11 0450 10/10/11 0440 10/09/11 0512  WBC -- 23.0* --  HGB -- 10.1* --  PLT -- 244 --  LABCREA -- -- --  CREATININE 0.61 -- 1.01   Estimated Creatinine Clearance: 51 ml/min (by C-G formula based on Cr of 0.61).   CrCl = 90 ml/min (normalized)  Vancomycin trough = 5.6 mcg/ml  Microbiology: Recent Results (from the past 720 hour(s))  URINE CULTURE     Status: Normal   Collection Time   10/01/11  4:53 PM      Component Value Range Status Comment   Specimen Description URINE, CATHETERIZED   Final    Special Requests Immunocompromised   Final    Setup Time 201212120203   Final    Colony Count >=100,000 COLONIES/ML   Final    Culture     Final    Value: ESCHERICHIA COLI     Note: Confirmed Extended Spectrum Beta-Lactamase Producer (ESBL)     Note: CRITICAL RESULT CALLED TO, READ BACK BY AND VERIFIED WITH: Adelfa Koh 12/13 AT 2306 BY DUNNJ   Report Status 10/03/2011 FINAL   Final    Organism ID, Bacteria ESCHERICHIA COLI   Final   CULTURE, BLOOD (ROUTINE X 2)     Status: Normal   Collection Time   10/01/11  7:28 PM      Component Value Range Status Comment   Specimen Description BLOOD RIGHT FOREARM   Final    Special Requests BOTTLES DRAWN AEROBIC AND ANAEROBIC 5 CC EACH   Final    Setup Time 201212120137   Final    Culture NO GROWTH 5 DAYS   Final    Report Status 10/08/2011 FINAL   Final     CULTURE, BLOOD (ROUTINE X 2)     Status: Normal   Collection Time   10/01/11  7:28 PM      Component Value Range Status Comment   Specimen Description BLOOD LEFT HAND   Final    Special Requests BOTTLES DRAWN AEROBIC AND ANAEROBIC 5 CC EACH   Final    Setup Time [id number]   Final    Culture NO GROWTH 5 DAYS   Final    Report Status 10/08/2011 FINAL   Final   CULTURE, SPUTUM-ASSESSMENT     Status: Normal   Collection Time   10/07/11  6:44 AM      Component Value Range Status Comment   Specimen Description SPUTUM   Final    Special Requests NONE   Final    Sputum evaluation     Final    Value: MICROSCOPIC FINDINGS SUGGEST THAT THIS SPECIMEN IS NOT REPRESENTATIVE OF LOWER RESPIRATORY SECRETIONS. PLEASE RECOLLECT.     NOTIFIED WILLIAMS,A. RN AT [phone] ON 10/07/11 BY GILLESPIE,B.   Report Status 10/07/2011 FINAL   Final     Anti-infectives     Start     Dose/Rate Route Frequency Ordered Stop  10/10/11 0830   vancomycin (VANCOCIN) 500 mg in sodium chloride 0.9 % 100 mL IVPB        500 mg 100 mL/hr over 60 Minutes Intravenous Every 12 hours 10/10/11 0806     10/10/11 0800   cefTRIAXone (ROCEPHIN) 1 g in dextrose 5 % 50 mL IVPB     Comments: Aware that she has had hives with penicillin. Risk outweighs benefit and she was recently on levaquin, not many alternatives for rocephin      1 g 100 mL/hr over 30 Minutes Intravenous Every 24 hours 10/10/11 0735     10/03/11 1000   levofloxacin (LEVAQUIN) tablet 500 mg  Status:  Discontinued        500 mg Oral Daily 10/03/11 0648 10/04/11 0737   10/01/11 1915   levofloxacin (LEVAQUIN) IVPB 500 mg  Status:  Discontinued        500 mg 100 mL/hr over 60 Minutes Intravenous Every 24 hours 10/01/11 1914 10/03/11 0648          Assessment:  76 YOF on day #2 empiric vancomycin/rocephin for LLL PNA  Vancomycin trough prior to 4th dose on 500mg  q12h subtherapeutic  Goal of Therapy:  Vancomycin trough level 15-20 mcg/ml  Plan:   Increase  vancomycin 1gm IV q12h  Recheck trough at steady state if therapy continues  Rollene Fare 10/11/2011,9:11 PM Pager: [phone]

## 2011-10-12 LAB — GLUCOSE, CAPILLARY
Glucose-Capillary: 147 mg/dL — ABNORMAL HIGH (ref 70–99)
Glucose-Capillary: 219 mg/dL — ABNORMAL HIGH (ref 70–99)
Glucose-Capillary: 264 mg/dL — ABNORMAL HIGH (ref 70–99)

## 2011-10-12 LAB — CBC
MCV: 106.6 fL — ABNORMAL HIGH (ref 78.0–100.0)
Platelets: 254 10*3/uL (ref 150–400)
RBC: 3.03 MIL/uL — ABNORMAL LOW (ref 3.87–5.11)
RDW: 13.7 % (ref 11.5–15.5)
WBC: 11.9 10*3/uL — ABNORMAL HIGH (ref 4.0–10.5)

## 2011-10-12 MED ORDER — SODIUM CHLORIDE 0.9 % IV SOLN
1250.0000 mg | Freq: Two times a day (BID) | INTRAVENOUS | Status: DC
  Administered 2011-10-12 – 2011-10-14 (×4): 1250 mg via INTRAVENOUS
  Filled 2011-10-12 (×5): qty 1250

## 2011-10-12 MED ORDER — PREDNISONE 20 MG PO TABS
40.0000 mg | ORAL_TABLET | Freq: Every day | ORAL | Status: DC
  Administered 2011-10-13: 40 mg via ORAL
  Filled 2011-10-12: qty 2

## 2011-10-12 NOTE — Progress Notes (Signed)
Subjective: Her strength is improving somewhat. Her appetite is excellent. She continues to have a mildly productive cough. She was able to walk in the hallways yesterday with assistance. Today her blood pressures improved from yesterday. She was treated with increase in prednisone dosing and IV fluids. We again discussed probable eventual skilled nursing facility discharge to complete treatment for pneumonia and physical deconditioning.  Objective: Vital signs in last 24 hours: Temp:  [97.5 F (36.4 C)-98.3 F (36.8 C)] 98.3 F (36.8 C) (12/22 0712) Pulse Rate:  [88-108] 88  (12/22 0712) Resp:  [16-20] 16  (12/22 0712) BP: (86-110)/(58-69) 110/69 mmHg (12/22 0712) SpO2:  [93 %-96 %] 96 % (12/22 0712) Weight:  [60.8 kg (134 lb 0.6 oz)] 134 lb 0.6 oz (60.8 kg) (12/22 1610) Weight change:    Intake/Output from previous day: 12/21 0701 - 12/22 0700 In: 1899.3 [P.O.:720; I.V.:1179.3] Out: 1900 [Urine:1900]   General appearance: alert, cooperative and frequent dry cough Resp: bibasilar crackles Cardio: regular rate and rhythm, S1, S2 normal, no murmur, click, rub or gallop Extremities: extremities normal, atraumatic, no cyanosis or edema  Lab Results:  Basename 10/12/11 0410 10/10/11 0440  WBC 11.9* 23.0*  HGB 10.4* 10.1*  HCT 32.3* 30.8*  PLT 254 244   BMET  Basename 10/11/11 0450  NA 139  K 4.0  CL 101  CO2 32  GLUCOSE 96  BUN 15  CREATININE 0.61  CALCIUM 9.3   CMET CMP     Component Value Date/Time   NA 139 10/11/2011 0450   K 4.0 10/11/2011 0450   CL 101 10/11/2011 0450   CO2 32 10/11/2011 0450   GLUCOSE 96 10/11/2011 0450   BUN 15 10/11/2011 0450   CREATININE 0.61 10/11/2011 0450   CALCIUM 9.3 10/11/2011 0450   PROT 5.7* 10/03/2011 0340   ALBUMIN 2.5* 10/03/2011 0340   AST 10 10/03/2011 0340   ALT 8 10/03/2011 0340   ALKPHOS 53 10/03/2011 0340   BILITOT 0.3 10/03/2011 0340   GFRNONAA 87* 10/11/2011 0450   GFRAA >90 10/11/2011 0450    CBG (last 3)     Basename 10/12/11 0740 10/12/11 0029 10/11/11 1140  GLUCAP 147* 219* 165*    INR RESULTS:   Lab Results  Component Value Date   INR 0.87 09/16/2011   INR 2.6* 05/12/2008   INR 2.3* 05/11/2008     Studies/Results: No results found.  Medications: I have reviewed the patient's current medications.  Assessment/Plan: #1 Pneumonia: Slowly improving on broad-spectrum antibiotics. She now has a PICC line in place to facilitate administration of IV antibiotics. #2 Adrenal Insufficiency: Yesterday her blood pressure was somewhat well, possibly from iatrogenic adrenal insufficiency, and improved with an increase in her prednisone dosing. We will try reducing her prednisone to 40 mg daily tomorrow. #3 Physical Deconditioning: Moderately severe and she will need skilled nursing facility rehabilitation for a brief period.  LOS: 11 days   Eian Vandervelden G 10/12/2011, 10:33 AM

## 2011-10-12 NOTE — Progress Notes (Signed)
ANTIBIOTIC CONSULT NOTE - FOLLOW UP  Pharmacy Consult for Vancomycin Indication: PNA  Allergies  Allergen Reactions  . Penicillins Hives    Patient Measurements: Height: 5\' 3"  (160 cm) Weight: 134 lb 0.6 oz (60.8 kg) IBW/kg (Calculated) : 52.4    Vital Signs: Temp: 98.3 F (36.8 C) (12/22 0712) Temp src: Oral (12/22 0712) BP: 110/69 mmHg (12/22 0712) Pulse Rate: 88  (12/22 0712) Intake/Output from previous day: 12/21 0701 - 12/22 0700 In: 1899.3 [P.O.:720; I.V.:1179.3] Out: 1900 [Urine:1900] Intake/Output from this shift: Total I/O In: -  Out: 1625 [Urine:1625]  Labs:  [facility] 10/12/11 0410 10/11/11 0450 10/10/11 0440  WBC 11.9* -- 23.0*  HGB 10.4* -- 10.1*  PLT 254 -- 244  LABCREA -- -- --  CREATININE -- 0.61 --   Estimated Creatinine Clearance: 51 ml/min (by C-G formula based on Cr of 0.61).  Basename 10/11/11 2010  VANCOTROUGH 5.6*  VANCOPEAK --  Drue Dun --  GENTTROUGH --  GENTPEAK --  GENTRANDOM --  TOBRATROUGH --  TOBRAPEAK --  TOBRARND --  AMIKACINPEAK --  AMIKACINTROU --  AMIKACIN --     Microbiology: Recent Results (from the past 720 hour(s))  URINE CULTURE     Status: Normal   Collection Time   10/01/11  4:53 PM      Component Value Range Status Comment   Specimen Description URINE, CATHETERIZED   Final    Special Requests Immunocompromised   Final    Setup Time 201212120203   Final    Colony Count >=100,000 COLONIES/ML   Final    Culture     Final    Value: ESCHERICHIA COLI     Note: Confirmed Extended Spectrum Beta-Lactamase Producer (ESBL)     Note: CRITICAL RESULT CALLED TO, READ BACK BY AND VERIFIED WITH: Adelfa Koh 12/13 AT 2306 BY DUNNJ   Report Status 10/03/2011 FINAL   Final    Organism ID, Bacteria ESCHERICHIA COLI   Final   CULTURE, BLOOD (ROUTINE X 2)     Status: Normal   Collection Time   10/01/11  7:28 PM      Component Value Range Status Comment   Specimen Description BLOOD RIGHT FOREARM   Final    Special  Requests BOTTLES DRAWN AEROBIC AND ANAEROBIC 5 CC EACH   Final    Setup Time 201212120137   Final    Culture NO GROWTH 5 DAYS   Final    Report Status 10/08/2011 FINAL   Final   CULTURE, BLOOD (ROUTINE X 2)     Status: Normal   Collection Time   10/01/11  7:28 PM      Component Value Range Status Comment   Specimen Description BLOOD LEFT HAND   Final    Special Requests BOTTLES DRAWN AEROBIC AND ANAEROBIC 5 CC EACH   Final    Setup Time 201212120137   Final    Culture NO GROWTH 5 DAYS   Final    Report Status 10/08/2011 FINAL   Final   CULTURE, SPUTUM-ASSESSMENT     Status: Normal   Collection Time   10/07/11  6:44 AM      Component Value Range Status Comment   Specimen Description SPUTUM   Final    Special Requests NONE   Final    Sputum evaluation     Final    Value: MICROSCOPIC FINDINGS SUGGEST THAT THIS SPECIMEN IS NOT REPRESENTATIVE OF LOWER RESPIRATORY SECRETIONS. PLEASE RECOLLECT.     NOTIFIED [facility]. RN AT [phone] ON  10/07/11 BY GILLESPIE,B.   Report Status 10/07/2011 FINAL   Final     Anti-infectives     Start     Dose/Rate Route Frequency Ordered Stop   10/12/11 0830   vancomycin (VANCOCIN) 500 mg in sodium chloride 0.9 % 100 mL IVPB        500 mg 100 mL/hr over 60 Minutes Intravenous Every 12 hours 10/11/11 2122     10/10/11 0830   vancomycin (VANCOCIN) 500 mg in sodium chloride 0.9 % 100 mL IVPB  Status:  Discontinued        500 mg 100 mL/hr over 60 Minutes Intravenous Every 12 hours 10/10/11 0806 10/11/11 2121   10/10/11 0800   cefTRIAXone (ROCEPHIN) 1 g in dextrose 5 % 50 mL IVPB     Comments: Aware that she has had hives with penicillin. Risk outweighs benefit and she was recently on levaquin, not many alternatives for rocephin      1 g 100 mL/hr over 30 Minutes Intravenous Every 24 hours 10/10/11 0735     10/03/11 1000   levofloxacin (LEVAQUIN) tablet 500 mg  Status:  Discontinued        500 mg Oral Daily 10/03/11 0648 10/04/11 0737   10/01/11 1915    levofloxacin (LEVAQUIN) IVPB 500 mg  Status:  Discontinued        500 mg 100 mL/hr over 60 Minutes Intravenous Every 24 hours 10/01/11 1914 10/03/11 0648          Assessment: 76 yo F on Day#3 vancomycin/ceftriaxone for PNA. AF. Renal fnx improving with CrCl 51 ml/min. No new micro data.  Will adjust vancomycin based on improved renal fnx. Would recommend checking trough prior to d/c to SNF if possible.   Goal of Therapy:  Vancomycin trough level 15-20 mcg/ml  Plan:   Increase Vancomycin 1250mg  IV q12h based on subtherapeutic trough 12/21 of 5.6  Check vancomycin trough at concentration steady state  Monitor Scr  Bret Vanessen, Loma Messing PharmD 2:46 PM 10/12/2011

## 2011-10-13 ENCOUNTER — Inpatient Hospital Stay (HOSPITAL_COMMUNITY): Payer: Medicare Other

## 2011-10-13 LAB — GLUCOSE, CAPILLARY: Glucose-Capillary: 87 mg/dL (ref 70–99)

## 2011-10-13 MED ORDER — ENSURE PUDDING PO PUDG
1.0000 | Freq: Three times a day (TID) | ORAL | Status: DC
  Administered 2011-10-13 – 2011-10-14 (×4): 1 via ORAL
  Filled 2011-10-13 (×6): qty 1

## 2011-10-13 MED ORDER — PREDNISONE 20 MG PO TABS
30.0000 mg | ORAL_TABLET | Freq: Every day | ORAL | Status: DC
  Administered 2011-10-14: 30 mg via ORAL
  Filled 2011-10-13: qty 1

## 2011-10-13 NOTE — Progress Notes (Signed)
Subjective: Her strength is slowly improving. She continues to have a dry cough, but no shortness of breath at rest. She has been doing some ambulating in the hallways with assistance. Her appetite remains very good. She's not having any nausea or diarrhea. She is also not having any lightheadedness.  Objective: Vital signs in last 24 hours: Temp:  [98.2 F (36.8 C)-98.4 F (36.9 C)] 98.3 F (36.8 C) (12/23 0635) Pulse Rate:  [90-101] 90  (12/23 0635) Resp:  [16-20] 20  (12/23 0635) BP: (104-115)/(67-70) 115/70 mmHg (12/23 0635) SpO2:  [92 %-100 %] 100 % (12/23 0635) Weight:  [63.6 kg (140 lb 3.4 oz)] 140 lb 3.4 oz (63.6 kg) (12/23 1610) Weight change:    Intake/Output from previous day: 12/22 0701 - 12/23 0700 In: 3925 [P.O.:1300; I.V.:2125; IV Piggyback:500] Out: 2625 [Urine:2625]   General appearance: alert, cooperative and With frequent dry cough, no shortness of breath Resp:  very minimal bilateral scatteereed crackles, no wheezing Cardio: Regular rate and rhythm with a systolic ejection murmur grade 2/6 at the left sternal border Extremities: extremities normal, atraumatic, no cyanosis or edema  Lab Results:  [facility] 10/12/11 0410  WBC 11.9*  HGB 10.4*  HCT 32.3*  PLT 254   BMET  Basename 10/11/11 0450  NA 139  K 4.0  CL 101  CO2 32  GLUCOSE 96  BUN 15  CREATININE 0.61  CALCIUM 9.3   CMET CMP     Component Value Date/Time   NA 139 10/11/2011 0450   K 4.0 10/11/2011 0450   CL 101 10/11/2011 0450   CO2 32 10/11/2011 0450   GLUCOSE 96 10/11/2011 0450   BUN 15 10/11/2011 0450   CREATININE 0.61 10/11/2011 0450   CALCIUM 9.3 10/11/2011 0450   PROT 5.7* 10/03/2011 0340   ALBUMIN 2.5* 10/03/2011 0340   AST 10 10/03/2011 0340   ALT 8 10/03/2011 0340   ALKPHOS 53 10/03/2011 0340   BILITOT 0.3 10/03/2011 0340   GFRNONAA 87* 10/11/2011 0450   GFRAA >90 10/11/2011 0450    CBG (last 3)   Basename 10/13/11 0651 10/13/11 0025 10/12/11 1811  GLUCAP 87  184* 210*    INR RESULTS:   Lab Results  Component Value Date   INR 0.87 09/16/2011   INR 2.6* 05/12/2008   INR 2.3* 05/11/2008     Studies/Results: No results found.  Medications: I have reviewed the patient's current medications.  Assessment/Plan: #1 Left-Sided Pneumonia: Clinically much improved with improved physical exam. Her mild elevation of white blood cell count could be from prednisone treatment. We will recheck a chest x-ray today and continue current treatment. She will need brief skilled nursing facility treatment for completion of IV antibiotics should be for 14 days given her immunosuppression from chronic prednisone. #2 Physical Deconditioning: Moderately severe, and needs brief skilled nursing facility rehabilitation. #3 Adrenal Insufficiency: Stable on increased dose of prednisone. We will try to decrease prednisone to 30 mg daily starting tomorrow.  LOS: 12 days   [name] G 10/13/2011, 11:20 AM

## 2011-10-14 MED ORDER — MOXIFLOXACIN HCL 400 MG PO TABS
400.0000 mg | ORAL_TABLET | Freq: Every day | ORAL | Status: DC
Start: 2011-10-14 — End: 2011-10-14
  Filled 2011-10-14: qty 1

## 2011-10-14 MED ORDER — MOXIFLOXACIN HCL IN NACL 400 MG/250ML IV SOLN
400.0000 mg | Freq: Every day | INTRAVENOUS | Status: DC
  Filled 2011-10-14: qty 250

## 2011-10-14 MED ORDER — MOXIFLOXACIN HCL 400 MG PO TABS
400.0000 mg | ORAL_TABLET | Freq: Every day | ORAL | Status: AC
Start: 2011-10-14 — End: 2011-10-20

## 2011-10-14 MED ORDER — BENZONATATE 100 MG PO CAPS: 100.0000 mg | ORAL_CAPSULE | Freq: Three times a day (TID) | ORAL | Status: AC

## 2011-10-14 MED ORDER — PREDNISONE 20 MG PO TABS: 20.0000 mg | ORAL_TABLET | Freq: Every day | ORAL | Status: AC

## 2011-10-14 MED ORDER — PREDNISONE 20 MG PO TABS: 20.0000 mg | ORAL_TABLET | Freq: Every day | ORAL | Status: DC

## 2011-10-14 NOTE — Progress Notes (Signed)
CSW received re-consult for pt for SNF. CSW visited with pt and she was getting dressed to discharge home. Pt states she still refused SNF placement and plans to be home for the holidays resting on her couch. Pts son is at the bedside and plans to transport the pt home. CSW singing off.  Patrice Paradise, LCSWA 10/14/2011 11:14 AM [phone]
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
## 2011-11-11 ENCOUNTER — Emergency Department (HOSPITAL_COMMUNITY): Payer: Medicare Other

## 2011-11-11 ENCOUNTER — Inpatient Hospital Stay (HOSPITAL_COMMUNITY)
Admission: EM | Admit: 2011-11-11 | Discharge: 2011-11-18 | DRG: 194 | Disposition: A | Discharge status: Home / Self Care | Payer: Medicare Other | Attending: Internal Medicine | Admitting: Internal Medicine

## 2011-11-11 DIAGNOSIS — R509 Fever, unspecified: Secondary | ICD-10-CM

## 2011-11-11 DIAGNOSIS — J189 Pneumonia, unspecified organism: Principal | ICD-10-CM | POA: Diagnosis present

## 2011-11-11 DIAGNOSIS — R51 Headache: Secondary | ICD-10-CM | POA: Diagnosis not present

## 2011-11-11 DIAGNOSIS — T380X5A Adverse effect of glucocorticoids and synthetic analogues, initial encounter: Secondary | ICD-10-CM | POA: Diagnosis present

## 2011-11-11 DIAGNOSIS — R4182 Altered mental status, unspecified: Secondary | ICD-10-CM

## 2011-11-11 DIAGNOSIS — R5381 Other malaise: Secondary | ICD-10-CM | POA: Diagnosis present

## 2011-11-11 DIAGNOSIS — E2740 Unspecified adrenocortical insufficiency: Secondary | ICD-10-CM | POA: Diagnosis present

## 2011-11-11 DIAGNOSIS — R627 Adult failure to thrive: Secondary | ICD-10-CM

## 2011-11-11 DIAGNOSIS — J0130 Acute sphenoidal sinusitis, unspecified: Secondary | ICD-10-CM | POA: Diagnosis present

## 2011-11-11 DIAGNOSIS — R269 Unspecified abnormalities of gait and mobility: Secondary | ICD-10-CM | POA: Diagnosis present

## 2011-11-11 DIAGNOSIS — M810 Age-related osteoporosis without current pathological fracture: Secondary | ICD-10-CM | POA: Diagnosis present

## 2011-11-11 DIAGNOSIS — B9620 Unspecified Escherichia coli [E. coli] as the cause of diseases classified elsewhere: Secondary | ICD-10-CM | POA: Diagnosis present

## 2011-11-11 DIAGNOSIS — IMO0002 Reserved for concepts with insufficient information to code with codable children: Secondary | ICD-10-CM

## 2011-11-11 DIAGNOSIS — E2749 Other adrenocortical insufficiency: Secondary | ICD-10-CM | POA: Diagnosis present

## 2011-11-11 DIAGNOSIS — R799 Abnormal finding of blood chemistry, unspecified: Secondary | ICD-10-CM | POA: Diagnosis present

## 2011-11-11 DIAGNOSIS — N390 Urinary tract infection, site not specified: Secondary | ICD-10-CM | POA: Diagnosis present

## 2011-11-11 DIAGNOSIS — R05 Cough: Secondary | ICD-10-CM | POA: Diagnosis present

## 2011-11-11 DIAGNOSIS — E119 Type 2 diabetes mellitus without complications: Secondary | ICD-10-CM | POA: Diagnosis present

## 2011-11-11 DIAGNOSIS — M069 Rheumatoid arthritis, unspecified: Secondary | ICD-10-CM | POA: Diagnosis present

## 2011-11-11 DIAGNOSIS — R059 Cough, unspecified: Secondary | ICD-10-CM | POA: Diagnosis present

## 2011-11-11 DIAGNOSIS — R531 Weakness: Secondary | ICD-10-CM

## 2011-11-11 DIAGNOSIS — IMO0001 Reserved for inherently not codable concepts without codable children: Secondary | ICD-10-CM

## 2011-11-11 HISTORY — DX: Pneumonia, unspecified organism: J18.9

## 2011-11-11 HISTORY — DX: Reserved for inherently not codable concepts without codable children: IMO0001

## 2011-11-11 HISTORY — DX: Other malaise: R53.81

## 2011-11-11 HISTORY — DX: Unsteadiness on feet: R26.81

## 2011-11-11 HISTORY — DX: Anxiety disorder, unspecified: F41.9

## 2011-11-11 LAB — DIFFERENTIAL
Basophils Absolute: 0 10*3/uL (ref 0.0–0.1)
Basophils Relative: 0 % (ref 0–1)
Eosinophils Absolute: 0 10*3/uL (ref 0.0–0.7)
Eosinophils Relative: 0 % (ref 0–5)
Lymphocytes Relative: 6 % — ABNORMAL LOW (ref 12–46)
Lymphs Abs: 0.9 10*3/uL (ref 0.7–4.0)
Monocytes Absolute: 2.1 10*3/uL — ABNORMAL HIGH (ref 0.1–1.0)
Monocytes Relative: 14 % — ABNORMAL HIGH (ref 3–12)
Neutro Abs: 12 10*3/uL — ABNORMAL HIGH (ref 1.7–7.7)
Neutrophils Relative %: 80 % — ABNORMAL HIGH (ref 43–77)

## 2011-11-11 LAB — CBC
HCT: 40.6 % (ref 36.0–46.0)
Hemoglobin: 14.1 g/dL (ref 12.0–15.0)
MCH: 33.8 pg (ref 26.0–34.0)
MCHC: 34.7 g/dL (ref 30.0–36.0)
MCV: 97.4 fL (ref 78.0–100.0)
Platelets: 237 10*3/uL (ref 150–400)
RBC: 4.17 MIL/uL (ref 3.87–5.11)
RDW: 13.5 % (ref 11.5–15.5)
WBC: 15 10*3/uL — ABNORMAL HIGH (ref 4.0–10.5)

## 2011-11-11 LAB — COMPREHENSIVE METABOLIC PANEL
ALT: 14 U/L (ref 0–35)
AST: 25 U/L (ref 0–37)
Albumin: 3.2 g/dL — ABNORMAL LOW (ref 3.5–5.2)
Alkaline Phosphatase: 74 U/L (ref 39–117)
BUN: 20 mg/dL (ref 6–23)
CO2: 21 mEq/L (ref 19–32)
Calcium: 9.1 mg/dL (ref 8.4–10.5)
Chloride: 95 mEq/L — ABNORMAL LOW (ref 96–112)
Creatinine, Ser: 0.61 mg/dL (ref 0.50–1.10)
GFR calc Af Amer: >90 mL/min (ref >90)
GFR calc non Af Amer: 87 mL/min — ABNORMAL LOW (ref >90)
Glucose, Bld: 122 mg/dL — ABNORMAL HIGH (ref 70–99)
Potassium: 3.9 mEq/L (ref 3.5–5.1)
Sodium: 134 mEq/L — ABNORMAL LOW (ref 135–145)
Total Bilirubin: 0.5 mg/dL (ref 0.3–1.2)
Total Protein: 7.1 g/dL (ref 6.0–8.3)

## 2011-11-11 MED ORDER — SODIUM CHLORIDE 0.9 % IV SOLN
INTRAVENOUS | Status: DC
  Administered 2011-11-11 – 2011-11-12 (×2): via INTRAVENOUS

## 2011-11-11 NOTE — ED Notes (Signed)
Pt fell in floor this AM. Found this PM by [employer]. Pt HX pneumonia. Discharged from [facility] 3 days ago. C/O generalized weakness, chill, fever.

## 2011-11-11 NOTE — ED Notes (Signed)
OZH:[id number]<HQ> Expected date:<BR> Expected time:<BR> Means of arrival:<BR> Comments:<BR> EMS/elderly/nausea and weakness

## 2011-11-11 NOTE — ED Notes (Signed)
Blood drawn with IV and sent to lab

## 2011-11-12 ENCOUNTER — Encounter (HOSPITAL_COMMUNITY): Payer: Self-pay | Admitting: Internal Medicine

## 2011-11-12 ENCOUNTER — Other Ambulatory Visit: Payer: Self-pay

## 2011-11-12 ENCOUNTER — Emergency Department (HOSPITAL_COMMUNITY): Payer: Medicare Other

## 2011-11-12 DIAGNOSIS — E2740 Unspecified adrenocortical insufficiency: Secondary | ICD-10-CM | POA: Diagnosis present

## 2011-11-12 DIAGNOSIS — M79609 Pain in unspecified limb: Secondary | ICD-10-CM

## 2011-11-12 DIAGNOSIS — J189 Pneumonia, unspecified organism: Secondary | ICD-10-CM | POA: Diagnosis present

## 2011-11-12 LAB — URINE MICROSCOPIC-ADD ON

## 2011-11-12 LAB — URINALYSIS, ROUTINE W REFLEX MICROSCOPIC
Glucose, UA: NEGATIVE mg/dL
Ketones, ur: >80 mg/dL — AB
Nitrite: NEGATIVE
Protein, ur: 100 mg/dL — AB
Specific Gravity, Urine: 1.027 (ref 1.005–1.030)
Urobilinogen, UA: 1 mg/dL (ref 0.0–1.0)
pH: 5.5 (ref 5.0–8.0)

## 2011-11-12 LAB — CK TOTAL AND CKMB (NOT AT ARMC)
CK, MB: 3.7 ng/mL (ref 0.3–4.0)
Relative Index: 0.5 (ref 0.0–2.5)
Total CK: 752 U/L — ABNORMAL HIGH (ref 7–177)

## 2011-11-12 LAB — LACTIC ACID, PLASMA: Lactic Acid, Venous: 1.4 mmol/L (ref 0.5–2.2)

## 2011-11-12 LAB — GLUCOSE, CAPILLARY: Glucose-Capillary: 110 mg/dL — ABNORMAL HIGH (ref 70–99)

## 2011-11-12 LAB — TROPONIN I: Troponin I: <0.3 ng/mL (ref <0.30)

## 2011-11-12 LAB — D-DIMER, QUANTITATIVE: D-Dimer, Quant: 3.26 ug/mL-FEU — ABNORMAL HIGH (ref 0.00–0.48)

## 2011-11-12 MED ORDER — POLYVINYL ALCOHOL 1.4 % OP SOLN
1.0000 [drp] | Freq: Three times a day (TID) | OPHTHALMIC | Status: DC | PRN
  Filled 2011-11-12: qty 15

## 2011-11-12 MED ORDER — ZOLPIDEM TARTRATE 5 MG PO TABS: 5.0000 mg | ORAL_TABLET | Freq: Every evening | ORAL | Status: DC | PRN

## 2011-11-12 MED ORDER — LEVALBUTEROL HCL 0.63 MG/3ML IN NEBU
0.6300 mg | INHALATION_SOLUTION | Freq: Four times a day (QID) | RESPIRATORY_TRACT | Status: DC | PRN
  Filled 2011-11-12: qty 3

## 2011-11-12 MED ORDER — MIRTAZAPINE 30 MG PO TABS
30.0000 mg | ORAL_TABLET | Freq: Every day | ORAL | Status: DC
  Administered 2011-11-12 – 2011-11-17 (×6): 30 mg via ORAL
  Filled 2011-11-12 (×7): qty 1

## 2011-11-12 MED ORDER — POTASSIUM CHLORIDE IN NACL 20-0.9 MEQ/L-% IV SOLN
INTRAVENOUS | Status: DC
  Administered 2011-11-12 – 2011-11-16 (×6): via INTRAVENOUS
  Filled 2011-11-12 (×7): qty 1000

## 2011-11-12 MED ORDER — VANCOMYCIN HCL IN DEXTROSE 1-5 GM/200ML-% IV SOLN
1000.0000 mg | Freq: Once | INTRAVENOUS | Status: AC
  Administered 2011-11-12: 1000 mg via INTRAVENOUS
  Filled 2011-11-12: qty 200

## 2011-11-12 MED ORDER — PIPERACILLIN-TAZOBACTAM 3.375 G IVPB: 3.3750 g | Freq: Once | INTRAVENOUS | Status: DC

## 2011-11-12 MED ORDER — PANCRELIPASE (LIP-PROT-AMYL) 12000-38000 UNITS PO CPEP
1.0000 | ORAL_CAPSULE | Freq: Three times a day (TID) | ORAL | Status: DC
  Administered 2011-11-12 – 2011-11-18 (×19): 1 via ORAL
  Filled 2011-11-12 (×21): qty 1

## 2011-11-12 MED ORDER — INSULIN ASPART 100 UNIT/ML ~~LOC~~ SOLN
0.0000 [IU] | Freq: Three times a day (TID) | SUBCUTANEOUS | Status: DC
  Administered 2011-11-13 (×2): 2 [IU] via SUBCUTANEOUS
  Administered 2011-11-14: 3 [IU] via SUBCUTANEOUS
  Administered 2011-11-14: 1 [IU] via SUBCUTANEOUS
  Administered 2011-11-14 – 2011-11-15 (×2): 2 [IU] via SUBCUTANEOUS
  Administered 2011-11-15: 5 [IU] via SUBCUTANEOUS
  Administered 2011-11-16: 3 [IU] via SUBCUTANEOUS
  Administered 2011-11-16: 2 [IU] via SUBCUTANEOUS
  Administered 2011-11-17: 3 [IU] via SUBCUTANEOUS
  Administered 2011-11-17 – 2011-11-18 (×2): 2 [IU] via SUBCUTANEOUS

## 2011-11-12 MED ORDER — SODIUM CHLORIDE 0.9 % IV SOLN: INTRAVENOUS | Status: DC

## 2011-11-12 MED ORDER — LEVOFLOXACIN IN D5W 750 MG/150ML IV SOLN
750.0000 mg | INTRAVENOUS | Status: DC
  Administered 2011-11-12 – 2011-11-17 (×6): 750 mg via INTRAVENOUS
  Filled 2011-11-12 (×6): qty 150

## 2011-11-12 MED ORDER — INFLUENZA VIRUS VACC SPLIT PF IM SUSP
0.5000 mL | INTRAMUSCULAR | Status: AC
  Administered 2011-11-13: 0.5 mL via INTRAMUSCULAR
  Filled 2011-11-12: qty 0.5

## 2011-11-12 MED ORDER — ENOXAPARIN SODIUM 40 MG/0.4ML ~~LOC~~ SOLN
40.0000 mg | SUBCUTANEOUS | Status: DC
  Administered 2011-11-12 – 2011-11-18 (×7): 40 mg via SUBCUTANEOUS
  Filled 2011-11-12 (×7): qty 0.4

## 2011-11-12 MED ORDER — ALPRAZOLAM 1 MG PO TABS
1.0000 mg | ORAL_TABLET | Freq: Three times a day (TID) | ORAL | Status: DC
  Administered 2011-11-12 – 2011-11-18 (×19): 1 mg via ORAL
  Filled 2011-11-12 (×19): qty 1

## 2011-11-12 MED ORDER — GUAIFENESIN ER 600 MG PO TB12
600.0000 mg | ORAL_TABLET | Freq: Two times a day (BID) | ORAL | Status: DC
  Administered 2011-11-12 – 2011-11-18 (×13): 600 mg via ORAL
  Filled 2011-11-12 (×15): qty 1

## 2011-11-12 MED ORDER — OSELTAMIVIR PHOSPHATE 75 MG PO CAPS
75.0000 mg | ORAL_CAPSULE | Freq: Two times a day (BID) | ORAL | Status: DC
  Administered 2011-11-12 – 2011-11-13 (×4): 75 mg via ORAL
  Filled 2011-11-12 (×7): qty 1

## 2011-11-12 MED ORDER — SENNOSIDES-DOCUSATE SODIUM 8.6-50 MG PO TABS
1.0000 | ORAL_TABLET | Freq: Every evening | ORAL | Status: DC | PRN
  Administered 2011-11-14: 1 via ORAL
  Filled 2011-11-12: qty 1

## 2011-11-12 MED ORDER — SULFASALAZINE 500 MG PO TABS
500.0000 mg | ORAL_TABLET | Freq: Two times a day (BID) | ORAL | Status: DC
  Administered 2011-11-12 – 2011-11-18 (×13): 500 mg via ORAL
  Filled 2011-11-12 (×15): qty 1

## 2011-11-12 MED ORDER — IOHEXOL 300 MG/ML  SOLN
100.0000 mL | Freq: Once | INTRAMUSCULAR | Status: AC | PRN
  Administered 2011-11-12: 100 mL via INTRAVENOUS

## 2011-11-12 MED ORDER — HYDROCODONE-ACETAMINOPHEN 5-325 MG PO TABS
1.0000 | ORAL_TABLET | ORAL | Status: DC | PRN
  Administered 2011-11-14 – 2011-11-18 (×6): 1 via ORAL
  Filled 2011-11-12 (×6): qty 1

## 2011-11-12 MED ORDER — AMITRIPTYLINE HCL 50 MG PO TABS
50.0000 mg | ORAL_TABLET | Freq: Every day | ORAL | Status: DC
  Administered 2011-11-12 – 2011-11-17 (×6): 50 mg via ORAL
  Filled 2011-11-12 (×7): qty 1

## 2011-11-12 MED ORDER — ACETAMINOPHEN 325 MG PO TABS
650.0000 mg | ORAL_TABLET | Freq: Four times a day (QID) | ORAL | Status: DC | PRN
  Administered 2011-11-14 – 2011-11-16 (×4): 650 mg via ORAL
  Filled 2011-11-12 (×4): qty 2

## 2011-11-12 MED ORDER — INSULIN ASPART 100 UNIT/ML ~~LOC~~ SOLN
0.0000 [IU] | Freq: Every day | SUBCUTANEOUS | Status: DC
  Filled 2011-11-12: qty 3

## 2011-11-12 MED ORDER — ACETAMINOPHEN 650 MG RE SUPP: 650.0000 mg | Freq: Four times a day (QID) | RECTAL | Status: DC | PRN

## 2011-11-12 MED ORDER — BENZONATATE 100 MG PO CAPS
100.0000 mg | ORAL_CAPSULE | Freq: Three times a day (TID) | ORAL | Status: DC | PRN
  Administered 2011-11-13 – 2011-11-17 (×6): 100 mg via ORAL
  Filled 2011-11-12 (×6): qty 1

## 2011-11-12 MED ORDER — PROMETHAZINE HCL 25 MG/ML IJ SOLN
12.5000 mg | Freq: Four times a day (QID) | INTRAMUSCULAR | Status: DC | PRN
  Filled 2011-11-12: qty 1

## 2011-11-12 MED ORDER — VANCOMYCIN HCL IN DEXTROSE 1-5 GM/200ML-% IV SOLN
1000.0000 mg | INTRAVENOUS | Status: DC
  Administered 2011-11-13 – 2011-11-14 (×2): 1000 mg via INTRAVENOUS
  Filled 2011-11-12 (×3): qty 200

## 2011-11-12 MED ORDER — BUPROPION HCL 75 MG PO TABS
75.0000 mg | ORAL_TABLET | Freq: Every day | ORAL | Status: DC
  Administered 2011-11-12 – 2011-11-18 (×7): 75 mg via ORAL
  Filled 2011-11-12 (×7): qty 1

## 2011-11-12 MED ORDER — VITAMIN D (ERGOCALCIFEROL) 1.25 MG (50000 UNIT) PO CAPS
50000.0000 [IU] | ORAL_CAPSULE | ORAL | Status: DC
  Administered 2011-11-17: 50000 [IU] via ORAL
  Filled 2011-11-12: qty 1

## 2011-11-12 MED ORDER — PROMETHAZINE HCL 25 MG PO TABS: 12.5000 mg | ORAL_TABLET | Freq: Four times a day (QID) | ORAL | Status: DC | PRN

## 2011-11-12 MED ORDER — ALUM & MAG HYDROXIDE-SIMETH 200-200-20 MG/5ML PO SUSP
30.0000 mL | Freq: Four times a day (QID) | ORAL | Status: DC | PRN
Start: 2011-11-12 — End: 2011-11-18

## 2011-11-12 MED ORDER — PREDNISONE 20 MG PO TABS
20.0000 mg | ORAL_TABLET | Freq: Every day | ORAL | Status: DC
  Administered 2011-11-13 – 2011-11-18 (×6): 20 mg via ORAL
  Filled 2011-11-12 (×6): qty 1

## 2011-11-12 NOTE — ED Notes (Signed)
Patient transported to CT 

## 2011-11-12 NOTE — ED Notes (Signed)
Attempted to establish 20 gauge SL for CT X 2. Good flashback noted on both IV sites, however both site infiltrated when flushed. IV team notified.

## 2011-11-12 NOTE — Progress Notes (Signed)
Bilateral lower extremity venous duplex completed at 09:42.  Preliminary report is negative for DVT, SVT, or a Baker's cyst. S. Red Mandt, RVT

## 2011-11-12 NOTE — ED Provider Notes (Signed)
Presents to the emergency department today after screaming for help and her neighbor called the police. Patient now states she does not recall anybody yelling calling for the police. She appears to be short of breath which family states is her usual. Patient does not smoke. Patient was recently admitted for pneumonia. Patient seems generally confused, she does not know who her doctor is he does not appear to know what happened during the day today. Family states her physician is Ivery Quale.  Medical screening examination/treatment/procedure(s) were conducted as a shared visit with non-physician practitioner(s) and myself.  I personally evaluated the patient during the encounter Devoria Albe, MD, Franz Dell, MD 11/12/11 [phone]

## 2011-11-12 NOTE — Progress Notes (Signed)
Occupational Therapy Chart reviewed and spoke to nsg who reports patient has been up to [facility] several times with nsg assist. Pt sleeping soundly when OT attempted for eval. Gave repeated verbal and tactile stimuli but pt still fast asleep. Will try back as schedule permits. Judithann Sauger OTR/L [phone] 11/12/2011

## 2011-11-12 NOTE — ED Notes (Signed)
Pt on bedpan. Call bell in reach.

## 2011-11-12 NOTE — ED Provider Notes (Signed)
History     CSN: [id number]  Arrival date & time 11/11/11  [name]   First MD Initiated Contact with Patient 11/11/11 2014      Chief Complaint  Patient presents with  . Weakness    HPI: Level V caveat applies to altered mental status. Patient is a 77 y.o. female presenting with weakness. The history is provided by a relative.  Weakness  Additional symptoms include weakness.  Patient's family at bedside reports patient was found by [facility] officer today in her home in the floor. Neighbor heard patient yelling for help and called [employer] officer to check on and found patient in floor. It is unknown how long patient had been down, and patient is unable to say. Patient appears pleasantly confused and denies any complaints of pain or other symptoms. Family states patient lives alone, is very independent and normally very alert and oriented. Family had been unable to contact her earlier in the day. To their knowledge, patient has not been ill or complained of any symptoms since her discharge from hospital Christmas Eve status post hospitalization for pneumonia.  Past Medical History  Diagnosis Date  . Osteoporosis   . Rheumatoid arthritis   . Murmur, cardiac     Past Surgical History  Procedure Date  . Knee arthroscopy     2 on left  . Cholecystectomy   . Thyroidectomy   . Abdominal hysterectomy     No family history on file.  History  Substance Use Topics  . Smoking status: Never Smoker   . Smokeless tobacco: Never Used  . Alcohol Use: No    OB History    Grav Para Term Preterm Abortions TAB SAB Ect Mult Living                  Review of Systems  Unable to perform ROS Neurological: Positive for weakness.    Allergies  Penicillins  Home Medications   Current Outpatient Rx  Name Route Sig Dispense Refill  . ALENDRONATE SODIUM 70 MG PO TABS Oral Take 70 mg by mouth every 7 (seven) days. Take with a full glass of water on an empty stomach.[name] on sundays per patient.      . ALPRAZOLAM 1 MG PO TABS Oral Take 1 mg by mouth 3 (three) times daily. Patient states she takes 3 times a day every day per patient.     Marland Kitchen AMITRIPTYLINE HCL 25 MG PO TABS Oral Take 50 mg by mouth at bedtime.     Marland Kitchen BENZONATATE 100 MG PO CAPS Oral Take 100 mg by mouth 3 (three) times daily as needed. For cough    . BUPROPION HCL 75 MG PO TABS Oral Take 75 mg by mouth every morning.     Marland Kitchen HYDROCODONE-ACETAMINOPHEN 5-500 MG PO TABS Oral Take 1 tablet by mouth 3 (three) times daily as needed. For pain     . HYPROMELLOSE 2.5 % OP SOLN Both Eyes Place 1 drop into both eyes as needed. For dry eyes     . PANCRELIPASE (LIP-PROT-AMYL) 12000 UNITS PO CPEP Oral Take 1 capsule by mouth 3 (three) times daily before meals. 270 capsule 1  . MIRTAZAPINE 30 MG PO TABS Oral Take 30 mg by mouth at bedtime.      Marland Kitchen PREDNISONE 20 MG PO TABS Oral Take 20 mg by mouth daily before breakfast.    . PREDNISONE 5 MG PO TABS Oral Take 7.5 mg by mouth daily with lunch.    Marland Kitchen  SULFASALAZINE 500 MG PO TABS Oral Take 500 mg by mouth 2 (two) times daily.      Marland Kitchen VITAMIN D (ERGOCALCIFEROL) 50000 UNITS PO CAPS Oral Take 50,000 Units by mouth every 7 (seven) days. Takes every Sunday per patient.       BP 107/56  Pulse 126  Temp(Src) 99.2 F (37.3 C) (Oral)  Resp 20  SpO2 96%  Physical Exam  Constitutional: She appears well-developed and well-nourished.  HENT:  Head: Normocephalic and atraumatic.  Eyes: Conjunctivae are normal. Pupils are equal, round, and reactive to light.  Neck: Neck supple.  Cardiovascular: Normal rate and regular rhythm.   Pulmonary/Chest: Effort normal and breath sounds normal.  Abdominal: Soft. Bowel sounds are normal.  Musculoskeletal: Normal range of motion.  Neurological: She is alert. She has normal strength. No cranial nerve deficit. GCS eye subscore is 4. GCS verbal subscore is 4. GCS motor subscore is 6.  Skin: Skin is warm and dry. No erythema.  Psychiatric: She has a normal mood and  affect. Her speech is normal and behavior is normal. Cognition and memory are impaired.    ED Course  Procedures  2315:  RN reports patient has become somewhat more tachycardic, heart rate 124 currently, and mildly tachypneac w/o hypoxemia. Has been placed on 2 L O2 via [place]. Pt noted resting in NAD. Continues to deny any c/o's CP, SOB or other symptoms. Will get EKG, troponin I,  lactic acid and d-dimer and reevaluate. I have discussed pt w/ Dr Lars Mage who is agreeable w/ plan.  1610:  Date: 11/12/2011  Rate: 124  Rhythm: sinus tachycardia  QRS Axis: normal  Intervals: normal  ST/T Wave abnormalities: ST depressions diffusely  Conduction Disutrbances:none  Narrative Interpretation: Probable LVH/   Old EKG Reviewed:    Tachycardia and ST depression noted on EKG from 10/01/2011  0100:  EKG shows a sinus tachycardia without other acute findings. EKG signed by Dr .Lars Mage. Trop I negative, d-dimer 3.2. We will plan for CT chest angiogram to r/o PE. and consult with pt's primary care physician with [facility] for admission.   0126: I have spoke with Dr Felipa Eth who has requested pt be admitted to med tel bed after Ct chest angio (if neg). Request to be called back if Ct chest  Angio shows PE. Patient continues to rest in no acute distress, heart rate remains in the 120s. Family updated and are agreeable with plan.  0300: Pt resting in NAD, HR currently 118, BP 102/64, R-22. 02 sats 98 % on 2L [place]. RN reports difficulty obtaining IV access required for Ct chest angio protocol. Will get IV team in to attempt to gain access.  0425: After some initial problems gaining IV access required for CT chest angiogram CT performed and completed, shows no evidence for PE, remaining evidence of multifocal pneumonia. Will facilitate patient's admission to floor, obtain blood cultures x 2 and start antimicrobial therapy. I have discussed pt w/ Dr Karma Ganja who is in agreement w/ plan.  CRITICAL  CARE Performed by: Leanne Chang   Total critical care time: 1 hour  Critical care time was exclusive of separately billable procedures and treating other patients.  Critical care was necessary to treat or prevent imminent or life-threatening deterioration.  Critical care was time spent personally by me on the following activities: development of treatment plan with patient and/or surrogate as well as nursing, discussions with consultants, evaluation of patient's response to treatment, examination of patient, obtaining history  from patient or surrogate, ordering and performing treatments and interventions, ordering and review of laboratory studies, ordering and review of radiographic studies, pulse oximetry and re-evaluation of patient's condition.  Labs Reviewed  CBC - Abnormal; Notable for the following:    WBC 15.0 (*)    All other components within normal limits  DIFFERENTIAL - Abnormal; Notable for the following:    Neutrophils Relative 80 (*)    Lymphocytes Relative 6 (*)    Monocytes Relative 14 (*)    Neutro Abs 12.0 (*)    Monocytes Absolute 2.1 (*)    All other components within normal limits  COMPREHENSIVE METABOLIC PANEL - Abnormal; Notable for the following:    Sodium 134 (*)    Chloride 95 (*)    Glucose, Bld 122 (*)    Albumin 3.2 (*)    GFR calc non Af Amer 87 (*)    All other components within normal limits  URINALYSIS, ROUTINE W REFLEX MICROSCOPIC - Abnormal; Notable for the following:    Color, Urine AMBER (*) BIOCHEMICALS MAY BE AFFECTED BY COLOR   APPearance CLOUDY (*)    Hgb urine dipstick TRACE (*)    Bilirubin Urine MODERATE (*)    Ketones, ur >80 (*)    Protein, ur 100 (*)    Leukocytes, UA SMALL (*)    All other components within normal limits  URINE MICROSCOPIC-ADD ON - Abnormal; Notable for the following:    Squamous Epithelial / LPF MANY (*)    Bacteria, UA MANY (*)    All other components within normal limits  D-DIMER, QUANTITATIVE -  Abnormal; Notable for the following:    D-Dimer, Quant 3.26 (*)    All other components within normal limits  CK TOTAL AND CKMB - Abnormal; Notable for the following:    Total CK 752 (*)    All other components within normal limits  TROPONIN I  LACTIC ACID, PLASMA   Dg Chest 2 View  11/11/2011  *RADIOLOGY REPORT*  Clinical Data: Status post fall.  Shortness of breath.  CHEST - 2 VIEW  Comparison: PA and lateral chest 10/10/2011 and CT chest 09/16/2011.  Findings: There is some mild subsegmental atelectasis in the lung bases.  No pneumothorax or pleural effusion. Heart size is normal. Remote T6 fracture is  noted.  IMPRESSION: No acute finding.  Original Report Authenticated By: Bernadene Bell. Maricela Curet, M.D.   Ct Head Wo Contrast  11/11/2011  *RADIOLOGY REPORT*  Clinical Data:  Found on floor; confusion and weakness.  Concern for head or cervical spine injury.  CT HEAD WITHOUT CONTRAST AND CT CERVICAL SPINE WITHOUT CONTRAST  Technique:  Multidetector CT imaging of the head and cervical spine was performed following the standard protocol without intravenous contrast.  Multiplanar CT image reconstructions of the cervical spine were also generated.  Comparison: CT of the head and cervical spine performed 09/16/2011  CT HEAD  Findings: There is no evidence of acute infarction, mass lesion, or intra- or extra-axial hemorrhage on CT.  Scattered periventricular and subcortical white matter change likely reflects small vessel ischemic microangiopathy.  Cerebellar atrophy is noted.  The brainstem and fourth ventricle are within normal limits.  The third and lateral ventricles, and basal ganglia are unremarkable in appearance.  The cerebral hemispheres demonstrate grossly normal gray-white differentiation.  No mass effect or midline shift is seen.  There is no evidence of fracture; visualized osseous structures are unremarkable in appearance.  The orbits are within normal limits. There is near-complete opacification  of the left side of the sphenoid sinus, and mucosal thickening within the left maxillary sinus.  The remaining paranasal sinuses and mastoid air cells are well-aerated.  No significant soft tissue abnormalities are seen.  IMPRESSION:  1.  No evidence of traumatic intracranial injury or fracture. 2.  Scattered small vessel ischemic microangiopathy noted. 3.  Near-complete opacification of the left side of the sphenoid sinus, and mucosal thickening within the left maxillary sinus.  CT CERVICAL SPINE  Findings: There is no evidence of fracture or subluxation. Vertebral bodies demonstrate normal height and alignment.  There is mild multilevel disc space narrowing along the cervical spine, with associated anterior and posterior disc osteophyte complexes. Prevertebral soft tissues are within normal limits.  There is incomplete fusion of the posterior elements of C2.  The thyroid gland is unremarkable in appearance.  Scarring is noted at the lung apices bilaterally.  No significant soft tissue abnormalities are seen.  Mild calcification is seen at both carotid bifurcations.  IMPRESSION:  1.  No evidence of fracture or subluxation along the cervical spine. 2.  Mild degenerative change noted along the lower cervical spine. 3.  Scarring noted at the lung apices bilaterally. 4.  Mild calcification seen at both carotid bifurcations.  Original Report Authenticated By: Tonia Ghent, M.D.   Ct Angio Chest W/cm &/or Wo Cm  11/12/2011  *RADIOLOGY REPORT*  Clinical Data: Sudden onset of shortness of breath; tachycardia. Elevated D-dimer.  Leukocytosis.  CT ANGIOGRAPHY CHEST  Technique:  Multidetector CT imaging of the chest using the standard protocol during bolus administration of intravenous contrast. Multiplanar reconstructed images including MIPs were obtained and reviewed to evaluate the vascular anatomy.  Contrast: OMNIPAQUE IOHEXOL 300 MG/ML IV SOLN  Comparison: Chest radiograph performed 11/11/2011, and CT of the  chest performed 09/16/2011  Findings: There is no evidence of pulmonary embolus.  Mild patchy peripheral airspace opacities are seen scattered throughout both lungs, most prominent within the right upper lobe. This is suspicious for pneumonia.  Mild bibasilar atelectasis is seen.  There is no evidence of pleural effusion or pneumothorax. No masses are identified; no abnormal focal contrast enhancement is seen.  There is asymmetric enlargement of the right pulmonary artery to 3.0 cm in diameter; this may reflect pulmonary arterial hypertension.  A mildly enlarged precarinal node is seen, measuring 1.2 cm in short axis; this demonstrates a normal fatty hilum. Scattered small hilar nodes remain normal in size.  No pericardial effusion is identified.  The great vessels are unremarkable in appearance.  No axillary lymphadenopathy is seen.  The thyroid gland is unremarkable in appearance.  The visualized portions of the liver and spleen are unremarkable.  No acute osseous abnormalities are seen.  There is a stable compression deformity of vertebral body T6.  IMPRESSION:  1.  No evidence of pulmonary embolus. 2.  Mild patchy peripheral airspace opacities scattered throughout both lungs, most prominent in the right upper lobe.  Findings suspicious for multifocal pneumonia. 3.  Mild bibasilar atelectasis seen. 4.  Asymmetric enlargement of the right pulmonary artery to 3.0 cm in diameter; this may reflect pulmonary arterial hypertension. 5.  Mildly enlarged precarinal node, measuring 1.2 cm; this demonstrates a normal fatty hilum. 6.  Stable compression deformity of vertebral body T6.  Original Report Authenticated By: Tonia Ghent, M.D.   Ct Cervical Spine Wo Contrast  11/11/2011  *RADIOLOGY REPORT*  Clinical Data:  Found on floor; confusion and weakness.  Concern for head or cervical spine injury.  CT HEAD  WITHOUT CONTRAST AND CT CERVICAL SPINE WITHOUT CONTRAST  Technique:  Multidetector CT imaging of the head and  cervical spine was performed following the standard protocol without intravenous contrast.  Multiplanar CT image reconstructions of the cervical spine were also generated.  Comparison: CT of the head and cervical spine performed 09/16/2011  CT HEAD  Findings: There is no evidence of acute infarction, mass lesion, or intra- or extra-axial hemorrhage on CT.  Scattered periventricular and subcortical white matter change likely reflects small vessel ischemic microangiopathy.  Cerebellar atrophy is noted.  The brainstem and fourth ventricle are within normal limits.  The third and lateral ventricles, and basal ganglia are unremarkable in appearance.  The cerebral hemispheres demonstrate grossly normal gray-white differentiation.  No mass effect or midline shift is seen.  There is no evidence of fracture; visualized osseous structures are unremarkable in appearance.  The orbits are within normal limits. There is near-complete opacification of the left side of the sphenoid sinus, and mucosal thickening within the left maxillary sinus.  The remaining paranasal sinuses and mastoid air cells are well-aerated.  No significant soft tissue abnormalities are seen.  IMPRESSION:  1.  No evidence of traumatic intracranial injury or fracture. 2.  Scattered small vessel ischemic microangiopathy noted. 3.  Near-complete opacification of the left side of the sphenoid sinus, and mucosal thickening within the left maxillary sinus.  CT CERVICAL SPINE  Findings: There is no evidence of fracture or subluxation. Vertebral bodies demonstrate normal height and alignment.  There is mild multilevel disc space narrowing along the cervical spine, with associated anterior and posterior disc osteophyte complexes. Prevertebral soft tissues are within normal limits.  There is incomplete fusion of the posterior elements of C2.  The thyroid gland is unremarkable in appearance.  Scarring is noted at the lung apices bilaterally.  No significant soft tissue  abnormalities are seen.  Mild calcification is seen at both carotid bifurcations.  IMPRESSION:  1.  No evidence of fracture or subluxation along the cervical spine. 2.  Mild degenerative change noted along the lower cervical spine. 3.  Scarring noted at the lung apices bilaterally. 4.  Mild calcification seen at both carotid bifurcations.  Original Report Authenticated By: Tonia Ghent, M.D.     1. Urinary tract infection   2. Pneumonia   3. Altered mental status       MDM  HPI/P and clinical findings c/w 1. Fall (Ct head and c-spine w/o acute findings,no other objective signs of tx) 2. Altered mental status w/o focal neurological findings w/ somewhat limited neuro exam (likely result of infection though still somewhat unclear)  3. UTI 4. Multifocal pneumonia  5. Tachycardia (EKG w/o acute findings, Trop I neg, EKG from 10/01/2011 shows tachycardia and st depression)        Leanne Chang, NP 11/12/11 [phone]

## 2011-11-12 NOTE — ED Notes (Signed)
Pt off bed pan. Urine on bed and pt. Pt cleaned. Linen changed.

## 2011-11-12 NOTE — ED Provider Notes (Signed)
See prior note   Ward Givens, MD 11/12/11 1034

## 2011-11-12 NOTE — H&P (Signed)
[name] is an 77 y.o. female.   Chief Complaint: Weakness and coughing HPI:  Patient is a 77 year old Caucasian woman who is well-known to me. She was recently admitted to this hospital with a urinary tract infection. During the hospitalization she also developed pneumonia with pleural effusion. She was somewhat weak but gradually her strength improved so that she was able to be discharged to home in late December 2012. She is on chronic prednisone for his severe rheumatoid arthritis. At followup evaluation about one week ago she was doing well. Over the past few days she's had increasing mildly productive cough with chills and weakness. She's had several minor falls without injury. Last night, she apparently had a fall and screamed out, so her neighbor called 911. She was transported to the emergency room with workup most remarkable for mildly elevated white blood cell count, bacteriuria, and a CT scan of the chest showing multifocal patchy airspace disease suggestive of multifocal pneumonia. She is being admitted for further evaluation.  Currently, she continues to feel quite weak, and she has a cough without shortness of breath during my visit. She has been needing well and has not had recent problems with constipation or diarrhea. She does admit to mild dysuria and frequency. She has chronic mild gait instability for which she uses a quad cane. During last admission I had advised a short-term skilled nursing facility stay for rehabilitation of her physical deconditioning, which he declined.  Past Medical History  Diagnosis Date  . Osteoporosis   . Rheumatoid arthritis   . Murmur, cardiac   . Diabetes mellitus   . Pneumonia   . Anxiety   . Adrenal insufficiency, primary, iatrogenic   . Gait instability   . Physical deconditioning     Medications Prior to Admission  Medication Dose Route Frequency Provider Last Rate Last Dose  . 0.9 % NaCl with KCl 20 mEq/ L  infusion   Intravenous  Continuous [name], MD      . acetaminophen (TYLENOL) tablet 650 mg  650 mg Oral Q6H PRN [name], MD       Or  . acetaminophen (TYLENOL) suppository 650 mg  650 mg Rectal Q6H PRN [name], MD      . ALPRAZolam Prudy Feeler) tablet 1 mg  1 mg Oral TID [name], MD      . alum & mag hydroxide-simeth (MAALOX/MYLANTA) 200-200-20 MG/5ML suspension 30 mL  30 mL Oral Q6H PRN [name], MD      . amitriptyline (ELAVIL) tablet 50 mg  50 mg Oral QHS [name], MD      . benzonatate (TESSALON) capsule 100 mg  100 mg Oral TID PRN [name], MD      . buPROPion [facility]) tablet 75 mg  75 mg Oral Q0700 [name], MD      . enoxaparin (LOVENOX) injection 40 mg  40 mg Subcutaneous Q24H [name], MD      . guaiFENesin [facility]) 12 hr tablet 600 mg  600 mg Oral BID [name], MD      . HYDROcodone-acetaminophen [facility]) 5-325 MG per tablet 1 tablet  1 tablet Oral Q4H PRN [name], MD      . hydroxypropyl methylcellulose (ISOPTO TEARS) 2.5 % ophthalmic solution 1 drop  1 drop Both Eyes TID PRN [name], MD      . iohexol (OMNIPAQUE) 300 MG/ML solution 100 mL  100 mL  Intravenous Once PRN Medication Radiologist, MD   100 mL at 11/12/11 0347  . levalbuterol (XOPENEX) nebulizer solution 0.63 mg  0.63 mg Nebulization Q6H PRN [name], MD      . Levofloxacin [facility]) IVPB 750 mg  750 mg Intravenous Q24H Roma Kayser Schorr, NP   750 mg at 11/12/11 0659  . lipase/protease/amylase (CREON-10/PANCREASE) capsule 1 capsule  1 capsule Oral TID AC [name], MD      . mirtazapine (REMERON) tablet 30 mg  30 mg Oral QHS [name], MD      . predniSONE (DELTASONE) tablet 20 mg  20 mg Oral QAC breakfast [name], MD      . promethazine (PHENERGAN) tablet 12.5 mg  12.5 mg Oral Q6H PRN [name], MD       Or  . promethazine (PHENERGAN) injection 12.5 mg  12.5 mg Intravenous Q6H PRN [name], MD      . senna-docusate (Senokot-S) tablet 1 tablet  1 tablet Oral QHS PRN [name], MD      . sulfaSALAzine (AZULFIDINE) tablet 500 mg  500 mg Oral BID [name], MD      . vancomycin (VANCOCIN) IVPB 1000 mg/200 mL premix  1,000 mg Intravenous Once Leanne Chang, NP   1,000 mg at 11/12/11 0511  . vancomycin (VANCOCIN) IVPB 1000 mg/200 mL premix  1,000 mg Intravenous Q24H [name], MD      . Vitamin D (Ergocalciferol) (DRISDOL) capsule 50,000 Units  50,000 Units Oral Q7 days [name], MD      . zolpidem [facility]) tablet 5 mg  5 mg Oral QHS PRN [name], MD      . DISCONTD: 0.9 %  sodium chloride infusion   Intravenous Continuous Roma Kayser Schorr, NP 125 mL/hr at 11/12/11 8295    . DISCONTD: 0.9 %  sodium chloride infusion   Intravenous STAT Leanne Chang, NP      . DISCONTD: piperacillin-tazobactam (ZOSYN) IVPB 3.375 g  3.375 g Intravenous Once Leanne Chang, NP       Medications Prior to Admission  Medication Sig Dispense Refill  . alendronate (FOSAMAX) 70 MG tablet Take 70 mg by mouth every 7 (seven) days. Take with a full glass of water on an empty stomach.[name] on sundays per patient.       . ALPRAZolam (XANAX) 1 MG tablet Take 1 mg by mouth 3 (three) times daily. Patient states she takes 3 times a day every day per patient.       Marland Kitchen amitriptyline (ELAVIL) 25 MG tablet Take 50 mg by mouth at bedtime.       Marland Kitchen buPROPion (WELLBUTRIN) 75 MG tablet Take 75 mg by mouth every morning.       Marland Kitchen HYDROcodone-acetaminophen (VICODIN) 5-500 MG per tablet Take 1 tablet by mouth 3 (three) times daily as needed. For pain       . hydroxypropyl methylcellulose (ISOPTO TEARS) 2.5 % ophthalmic solution Place 1 drop into both eyes as needed. For dry eyes       . lipase/protease/amylase (CREON-10/PANCREASE) 12000 UNITS CPEP Take 1 capsule by mouth 3 (three) times daily before meals.  270 capsule  1  . mirtazapine (REMERON) 30 MG tablet Take 30  mg by mouth at bedtime.        . sulfaSALAzine (AZULFIDINE) 500 MG tablet Take 500 mg by mouth 2 (two) times daily.        Marland Kitchen  Vitamin D, Ergocalciferol, (DRISDOL) 50000 UNITS CAPS Take 50,000 Units by mouth every 7 (seven) days. Takes every Sunday per patient.         ADDITIONAL HOME MEDICATIONS: No additional medications other than listed in her medication reconciliation.  PHYSICIANS INVOLVED IN CARE: Barry Dienes. Eloise Harman (primary care)  Past Surgical History  Procedure Date  . Knee arthroscopy     2 on left  . Cholecystectomy   . Thyroidectomy   . Abdominal hysterectomy     History reviewed. No pertinent family history.   Social History:  reports that she has never smoked. She has never used smokeless tobacco. She reports that she does not drink alcohol or use illicit drugs.  Allergies:  Allergies  Allergen Reactions  . Penicillins Hives     ROS: anemia, arthritis, diabetes and shortness of breath  PHYSICAL EXAM: Blood pressure 102/58, pulse 112, temperature 98.8 F (37.1 C), temperature source Oral, resp. rate 19, SpO2 95.00%. In general, she is an elderly white woman who had some sinus congestion, hoarse voice, and frequent dry cough without shortness of breath on nasal cannula oxygen. HEENT exam was within normal limits, neck was supple without jugular venous distention or carotid bruit, chest was clear to auscultation, heart had a regular rate and rhythm without significant murmur or gallop, abdomen had normal bowel sounds and no hepatosplenomegaly or tenderness, extremities were without cyanosis, clubbing, or edema. Neurologic exam: She was alert and well oriented with normal affect, and she did move all extremities well. Cerebellar function and gait were not assessed in the emergency room.  Results for orders placed during the hospital encounter of 11/11/11 (from the past 48 hour(s))  CBC     Status: Abnormal   Collection Time   11/11/11  9:12 PM      Component Value  Range Comment   WBC 15.0 (*) 4.0 - 10.5 (K/uL)    RBC 4.17  3.87 - 5.11 (MIL/uL)    Hemoglobin 14.1  12.0 - 15.0 (g/dL)    HCT 16.1  09.6 - 04.5 (%)    MCV 97.4  78.0 - 100.0 (fL)    MCH 33.8  26.0 - 34.0 (pg)    MCHC 34.7  30.0 - 36.0 (g/dL)    RDW 40.9  81.1 - 91.4 (%)    Platelets 237  150 - 400 (K/uL)   DIFFERENTIAL     Status: Abnormal   Collection Time   11/11/11  9:12 PM      Component Value Range Comment   Neutrophils Relative 80 (*) 43 - 77 (%)    Lymphocytes Relative 6 (*) 12 - 46 (%)    Monocytes Relative 14 (*) 3 - 12 (%)    Eosinophils Relative 0  0 - 5 (%)    Basophils Relative 0  0 - 1 (%)    Neutro Abs 12.0 (*) 1.7 - 7.7 (K/uL)    Lymphs Abs 0.9  0.7 - 4.0 (K/uL)    Monocytes Absolute 2.1 (*) 0.1 - 1.0 (K/uL)    Eosinophils Absolute 0.0  0.0 - 0.7 (K/uL)    Basophils Absolute 0.0  0.0 - 0.1 (K/uL)    RBC Morphology RARE NRBCs      WBC Morphology TOXIC GRANULATION   DOHLE BODIES  COMPREHENSIVE METABOLIC PANEL     Status: Abnormal   Collection Time   11/11/11  9:12 PM      Component Value Range Comment   Sodium 134 (*) 135 - 145 (mEq/L)  Potassium 3.9  3.5 - 5.1 (mEq/L)    Chloride 95 (*) 96 - 112 (mEq/L)    CO2 21  19 - 32 (mEq/L)    Glucose, Bld 122 (*) 70 - 99 (mg/dL)    BUN 20  6 - 23 (mg/dL)    Creatinine, Ser 1.19  0.50 - 1.10 (mg/dL)    Calcium 9.1  8.4 - 10.5 (mg/dL)    Total Protein 7.1  6.0 - 8.3 (g/dL)    Albumin 3.2 (*) 3.5 - 5.2 (g/dL)    AST 25  0 - 37 (U/L)    ALT 14  0 - 35 (U/L)    Alkaline Phosphatase 74  39 - 117 (U/L)    Total Bilirubin 0.5  0.3 - 1.2 (mg/dL)    GFR calc non Af Amer 87 (*) >90 (mL/min)    GFR calc Af Amer >90  >90 (mL/min)   URINALYSIS, ROUTINE W REFLEX MICROSCOPIC     Status: Abnormal   Collection Time   11/11/11 10:57 PM      Component Value Range Comment   Color, Urine AMBER (*) YELLOW  BIOCHEMICALS MAY BE AFFECTED BY COLOR   APPearance CLOUDY (*) CLEAR     Specific Gravity, Urine 1.027  1.005 - 1.030     pH 5.5   5.0 - 8.0     Glucose, UA NEGATIVE  NEGATIVE (mg/dL)    Hgb urine dipstick TRACE (*) NEGATIVE     Bilirubin Urine MODERATE (*) NEGATIVE     Ketones, ur >80 (*) NEGATIVE (mg/dL)    Protein, ur 147 (*) NEGATIVE (mg/dL)    Urobilinogen, UA 1.0  0.0 - 1.0 (mg/dL)    Nitrite NEGATIVE  NEGATIVE     Leukocytes, UA SMALL (*) NEGATIVE    URINE MICROSCOPIC-ADD ON     Status: Abnormal   Collection Time   11/11/11 10:57 PM      Component Value Range Comment   Squamous Epithelial / LPF MANY (*) RARE     WBC, UA 11-20  <3 (WBC/hpf)    RBC / HPF 0-2  <3 (RBC/hpf)    Bacteria, UA MANY (*) RARE     Urine-Other MUCOUS PRESENT     TROPONIN I     Status: Normal   Collection Time   11/12/11  1:18 AM      Component Value Range Comment   Troponin I <0.30  <0.30 (ng/mL)   D-DIMER, QUANTITATIVE     Status: Abnormal   Collection Time   11/12/11  1:18 AM      Component Value Range Comment   D-Dimer, Quant 3.26 (*) 0.00 - 0.48 (ug/mL-FEU)   LACTIC ACID, PLASMA     Status: Normal   Collection Time   11/12/11  1:18 AM      Component Value Range Comment   Lactic Acid, Venous 1.4  0.5 - 2.2 (mmol/L)   CK TOTAL AND CKMB     Status: Abnormal   Collection Time   11/12/11  1:18 AM      Component Value Range Comment   Total CK 752 (*) 7 - 177 (U/L)    CK, MB 3.7  0.3 - 4.0 (ng/mL)    Relative Index 0.5  0.0 - 2.5     Dg Chest 2 View  11/11/2011  *RADIOLOGY REPORT*  Clinical Data: Status post fall.  Shortness of breath.  CHEST - 2 VIEW  Comparison: PA and lateral chest 10/10/2011 and CT chest 09/16/2011.  Findings: There  is some mild subsegmental atelectasis in the lung bases.  No pneumothorax or pleural effusion. Heart size is normal. Remote T6 fracture is  noted.  IMPRESSION: No acute finding.  Original Report Authenticated By: Bernadene Bell. Maricela Curet, M.D.   Ct Head Wo Contrast  11/11/2011  *RADIOLOGY REPORT*  Clinical Data:  Found on floor; confusion and weakness.  Concern for head or cervical spine injury.  CT  HEAD WITHOUT CONTRAST AND CT CERVICAL SPINE WITHOUT CONTRAST  Technique:  Multidetector CT imaging of the head and cervical spine was performed following the standard protocol without intravenous contrast.  Multiplanar CT image reconstructions of the cervical spine were also generated.  Comparison: CT of the head and cervical spine performed 09/16/2011  CT HEAD  Findings: There is no evidence of acute infarction, mass lesion, or intra- or extra-axial hemorrhage on CT.  Scattered periventricular and subcortical white matter change likely reflects small vessel ischemic microangiopathy.  Cerebellar atrophy is noted.  The brainstem and fourth ventricle are within normal limits.  The third and lateral ventricles, and basal ganglia are unremarkable in appearance.  The cerebral hemispheres demonstrate grossly normal gray-white differentiation.  No mass effect or midline shift is seen.  There is no evidence of fracture; visualized osseous structures are unremarkable in appearance.  The orbits are within normal limits. There is near-complete opacification of the left side of the sphenoid sinus, and mucosal thickening within the left maxillary sinus.  The remaining paranasal sinuses and mastoid air cells are well-aerated.  No significant soft tissue abnormalities are seen.  IMPRESSION:  1.  No evidence of traumatic intracranial injury or fracture. 2.  Scattered small vessel ischemic microangiopathy noted. 3.  Near-complete opacification of the left side of the sphenoid sinus, and mucosal thickening within the left maxillary sinus.  CT CERVICAL SPINE  Findings: There is no evidence of fracture or subluxation. Vertebral bodies demonstrate normal height and alignment.  There is mild multilevel disc space narrowing along the cervical spine, with associated anterior and posterior disc osteophyte complexes. Prevertebral soft tissues are within normal limits.  There is incomplete fusion of the posterior elements of C2.  The thyroid  gland is unremarkable in appearance.  Scarring is noted at the lung apices bilaterally.  No significant soft tissue abnormalities are seen.  Mild calcification is seen at both carotid bifurcations.  IMPRESSION:  1.  No evidence of fracture or subluxation along the cervical spine. 2.  Mild degenerative change noted along the lower cervical spine. 3.  Scarring noted at the lung apices bilaterally. 4.  Mild calcification seen at both carotid bifurcations.  Original Report Authenticated By: Tonia Ghent, M.D.   Ct Angio Chest W/cm &/or Wo Cm  11/12/2011  *RADIOLOGY REPORT*  Clinical Data: Sudden onset of shortness of breath; tachycardia. Elevated D-dimer.  Leukocytosis.  CT ANGIOGRAPHY CHEST  Technique:  Multidetector CT imaging of the chest using the standard protocol during bolus administration of intravenous contrast. Multiplanar reconstructed images including MIPs were obtained and reviewed to evaluate the vascular anatomy.  Contrast: OMNIPAQUE IOHEXOL 300 MG/ML IV SOLN  Comparison: Chest radiograph performed 11/11/2011, and CT of the chest performed 09/16/2011  Findings: There is no evidence of pulmonary embolus.  Mild patchy peripheral airspace opacities are seen scattered throughout both lungs, most prominent within the right upper lobe. This is suspicious for pneumonia.  Mild bibasilar atelectasis is seen.  There is no evidence of pleural effusion or pneumothorax. No masses are identified; no abnormal focal contrast enhancement is seen.  There is asymmetric enlargement of the right pulmonary artery to 3.0 cm in diameter; this may reflect pulmonary arterial hypertension.  A mildly enlarged precarinal node is seen, measuring 1.2 cm in short axis; this demonstrates a normal fatty hilum. Scattered small hilar nodes remain normal in size.  No pericardial effusion is identified.  The great vessels are unremarkable in appearance.  No axillary lymphadenopathy is seen.  The thyroid gland is unremarkable in  appearance.  The visualized portions of the liver and spleen are unremarkable.  No acute osseous abnormalities are seen.  There is a stable compression deformity of vertebral body T6.  IMPRESSION:  1.  No evidence of pulmonary embolus. 2.  Mild patchy peripheral airspace opacities scattered throughout both lungs, most prominent in the right upper lobe.  Findings suspicious for multifocal pneumonia. 3.  Mild bibasilar atelectasis seen. 4.  Asymmetric enlargement of the right pulmonary artery to 3.0 cm in diameter; this may reflect pulmonary arterial hypertension. 5.  Mildly enlarged precarinal node, measuring 1.2 cm; this demonstrates a normal fatty hilum. 6.  Stable compression deformity of vertebral body T6.  Original Report Authenticated By: Tonia Ghent, M.D.   Ct Cervical Spine Wo Contrast  11/11/2011  *RADIOLOGY REPORT*  Clinical Data:  Found on floor; confusion and weakness.  Concern for head or cervical spine injury.  CT HEAD WITHOUT CONTRAST AND CT CERVICAL SPINE WITHOUT CONTRAST  Technique:  Multidetector CT imaging of the head and cervical spine was performed following the standard protocol without intravenous contrast.  Multiplanar CT image reconstructions of the cervical spine were also generated.  Comparison: CT of the head and cervical spine performed 09/16/2011  CT HEAD  Findings: There is no evidence of acute infarction, mass lesion, or intra- or extra-axial hemorrhage on CT.  Scattered periventricular and subcortical white matter change likely reflects small vessel ischemic microangiopathy.  Cerebellar atrophy is noted.  The brainstem and fourth ventricle are within normal limits.  The third and lateral ventricles, and basal ganglia are unremarkable in appearance.  The cerebral hemispheres demonstrate grossly normal gray-white differentiation.  No mass effect or midline shift is seen.  There is no evidence of fracture; visualized osseous structures are unremarkable in appearance.  The orbits are  within normal limits. There is near-complete opacification of the left side of the sphenoid sinus, and mucosal thickening within the left maxillary sinus.  The remaining paranasal sinuses and mastoid air cells are well-aerated.  No significant soft tissue abnormalities are seen.  IMPRESSION:  1.  No evidence of traumatic intracranial injury or fracture. 2.  Scattered small vessel ischemic microangiopathy noted. 3.  Near-complete opacification of the left side of the sphenoid sinus, and mucosal thickening within the left maxillary sinus.  CT CERVICAL SPINE  Findings: There is no evidence of fracture or subluxation. Vertebral bodies demonstrate normal height and alignment.  There is mild multilevel disc space narrowing along the cervical spine, with associated anterior and posterior disc osteophyte complexes. Prevertebral soft tissues are within normal limits.  There is incomplete fusion of the posterior elements of C2.  The thyroid gland is unremarkable in appearance.  Scarring is noted at the lung apices bilaterally.  No significant soft tissue abnormalities are seen.  Mild calcification is seen at both carotid bifurcations.  IMPRESSION:  1.  No evidence of fracture or subluxation along the cervical spine. 2.  Mild degenerative change noted along the lower cervical spine. 3.  Scarring noted at the lung apices bilaterally. 4.  Mild calcification seen at  both carotid bifurcations.  Original Report Authenticated By: Tonia Ghent, M.D.     Assessment/Plan #1 Cough: Although she was doing well at her recent office visit followup, she appears to have developed acute cough. This could be from multifocal pneumonia or the development of acute influenza given the prevalence in the community at this time. Her white blood cell elevation is most likely from chronic prednisone treatment, but could be from the pneumonia. We will continue treatment with Levaquin and vancomycin. If her symptoms worsen on this regimen then we  will switch Levaquin to Rocephin. In addition, because of the community high prevalence of influenza, we will start her on Tamiflu 75 mg twice daily for 5 days. #2 Elevated D-Dimer test: The CT angiogram of the chest did not show pulmonary embolism. She has not had significant leg edema. It is unlikely that she has a DVT, however this should be excluded because of the high risk associated with DVT. It is most likely that her elevated d-dimer is from her falls with some minor contusions. #3 Gait Instability: She is an interval worsening of her gait is most likely from her acute illness in the face of advanced rheumatoid arthritis and physical deconditioning. We will have a physical therapist and occupational therapist evaluate her gait and ability to perform activities of daily living. If she does not show a stable gait during this hospitalization then she will need to go to a skilled nursing facility for brief rehabilitation. #4 Iatrogenic Adrenal Insufficiency: She likely has some degree of adrenal insufficiency from chronic prednisone treatment at 7.5 mg daily. We will continue current dosing at 20 mg daily of prednisone and increased dosing if necessary. #5 Diabetes Mellitus, Type II: She has mild, diet controlled diabetes. We will continue to monitor her blood glucose levels in the hospital and administer sliding scale insulin if necessary. Kherington Meraz G 11/12/2011, 8:39 AM

## 2011-11-12 NOTE — Progress Notes (Signed)
ANTIBIOTIC CONSULT NOTE - INITIAL  Pharmacy Consult for Vancomycin Indication: pneumonia  Allergies  Allergen Reactions  . Penicillins Hives    Patient Measurements: Height: 5\' 3"  (160 cm) Weight: 137 lb 8 oz (62.37 kg) IBW/kg (Calculated) : 52.4   Vital Signs: Temp: 98 F (36.7 C) (01/22 0930) Temp src: Oral (01/22 0930) BP: 124/74 mmHg (01/22 0930) Pulse Rate: 117  (01/22 0930) Intake/Output from previous day:   Intake/Output from this shift:    Labs:  [facility] 11/11/11 2112  WBC 15.0*  HGB 14.1  PLT 237  LABCREA --  CREATININE 0.61   Estimated Creatinine Clearance: 51 ml/min (by C-G formula based on Cr of 0.61). No results found for this basename: VANCOTROUGH:2,VANCOPEAK:2,VANCORANDOM:2,GENTTROUGH:2,GENTPEAK:2,GENTRANDOM:2,TOBRATROUGH:2,TOBRAPEAK:2,TOBRARND:2,AMIKACINPEAK:2,AMIKACINTROU:2,AMIKACIN:2, in the last 72 hours   Microbiology: No results found for this or any previous visit (from the past 720 hour(s)).  Medical History: Past Medical History  Diagnosis Date  . Osteoporosis   . Rheumatoid arthritis   . Murmur, cardiac   . Diabetes mellitus   . Pneumonia   . Anxiety   . Adrenal insufficiency, primary, iatrogenic   . Gait instability   . Physical deconditioning     Medications:  Scheduled:    . ALPRAZolam  1 mg Oral TID  . amitriptyline  50 mg Oral QHS  . buPROPion  75 mg Oral Daily  . enoxaparin  40 mg Subcutaneous Q24H  . guaiFENesin  600 mg Oral BID  . influenza  inactive virus vaccine  0.5 mL Intramuscular Tomorrow-1000  . insulin aspart  0-5 Units Subcutaneous QHS  . insulin aspart  0-9 Units Subcutaneous TID WC  . levofloxacin (LEVAQUIN) IV  750 mg Intravenous Q24H  . lipase/protease/amylase  1 capsule Oral TID AC  . mirtazapine  30 mg Oral QHS  . oseltamivir  75 mg Oral BID  . predniSONE  20 mg Oral QAC breakfast  . sulfaSALAzine  500 mg Oral BID  . vancomycin  1,000 mg Intravenous Once  . vancomycin  1,000 mg Intravenous Q24H    . Vitamin D (Ergocalciferol)  50,000 Units Oral Q7 days  . DISCONTD: sodium chloride   Intravenous STAT  . DISCONTD: piperacillin-tazobactam (ZOSYN)  IV  3.375 g Intravenous Once   Anti-infectives     Start     Dose/Rate Route Frequency Ordered Stop   11/13/11 0800   vancomycin (VANCOCIN) IVPB 1000 mg/200 mL premix        1,000 mg 200 mL/hr over 60 Minutes Intravenous Every 24 hours 11/12/11 0839     11/12/11 1000   oseltamivir (TAMIFLU) capsule 75 mg        75 mg Oral 2 times daily 11/12/11 0853 11/17/11 0959   11/12/11 0500   Levofloxacin (LEVAQUIN) IVPB 750 mg        750 mg 100 mL/hr over 90 Minutes Intravenous Every 24 hours 11/12/11 0456     11/12/11 0445   vancomycin (VANCOCIN) IVPB 1000 mg/200 mL premix        1,000 mg 200 mL/hr over 60 Minutes Intravenous  Once 11/12/11 0442 11/12/11 0611   11/12/11 0445   piperacillin-tazobactam (ZOSYN) IVPB 3.375 g  Status:  Discontinued        3.375 g 12.5 mL/hr over 240 Minutes Intravenous  Once 11/12/11 0442 11/12/11 0457         Assessment: 77 yo female admitted for fall,weakness.  Recent admit in December for PNA Treat with abx for PNA, Tamiflu for possible influenza Elevated D-dimer: no PE/CT scan, dopplers  negative.  Goal of Therapy:  Vancomycin trough level 15-20 mcg/ml  Plan:  Continue Vancomycin as ordered at 1gm q24hr Levaquin 750 mg IV q24h, monitor renal fx, may need to adjust dose if renal fx decreases. Measure antibiotic drug levels at steady state Follow up culture results  Leaman Abe L 11/12/2011,11:47 AM

## 2011-11-12 NOTE — ED Notes (Addendum)
Vancomycin started in site #2

## 2011-11-12 NOTE — ED Notes (Signed)
Report attempted, nurse in with pt - to call back (tara, rn).

## 2011-11-13 LAB — CBC
MCH: 32.3 pg (ref 26.0–34.0)
MCHC: 32.8 g/dL (ref 30.0–36.0)
Platelets: 209 10*3/uL (ref 150–400)
RBC: 3.28 MIL/uL — ABNORMAL LOW (ref 3.87–5.11)

## 2011-11-13 LAB — COMPREHENSIVE METABOLIC PANEL
ALT: 16 U/L (ref 0–35)
AST: 24 U/L (ref 0–37)
CO2: 26 mEq/L (ref 19–32)
Chloride: 105 mEq/L (ref 96–112)
Creatinine, Ser: 0.55 mg/dL (ref 0.50–1.10)
GFR calc non Af Amer: >90 mL/min (ref >90)
Total Bilirubin: 0.2 mg/dL — ABNORMAL LOW (ref 0.3–1.2)

## 2011-11-13 LAB — GLUCOSE, CAPILLARY
Glucose-Capillary: 201 mg/dL — ABNORMAL HIGH (ref 70–99)
Glucose-Capillary: 186 mg/dL — ABNORMAL HIGH (ref 70–99)

## 2011-11-13 LAB — INFLUENZA PANEL BY PCR (TYPE A & B)
H1N1 flu by pcr: NOT DETECTED
Influenza B By PCR: NEGATIVE

## 2011-11-13 NOTE — Evaluation (Signed)
Occupational Therapy Evaluation Patient Details Name: [name] MRN: [id number] DOB: September 19, 1935 Today's Date: 11/13/2011  Problem List:  Patient Active Problem List  Diagnoses  . UTI (lower urinary tract infection)  . Fever  . Cough  . Failure to thrive in adult  . Weakness generalized  . Rheumatoid arthritis  . Type II or unspecified type diabetes mellitus without mention of complication, not stated as uncontrolled  . Pneumonia  . Adrenal insufficiency, primary, iatrogenic    Past Medical History:  Past Medical History  Diagnosis Date  . Osteoporosis   . Rheumatoid arthritis   . Murmur, cardiac   . Diabetes mellitus   . Pneumonia   . Anxiety   . Adrenal insufficiency, primary, iatrogenic   . Gait instability   . Physical deconditioning    Past Surgical History:  Past Surgical History  Procedure Date  . Knee arthroscopy     2 on left  . Cholecystectomy   . Thyroidectomy   . Abdominal hysterectomy     OT Assessment/Plan/Recommendation OT Assessment Clinical Impression Statement: Pt currently requires assist w/ LB ADL & selfcare tasks as well funct mobility w/ RW, she lives alone and should benefit from acute OT to address decreased independence in areas of ADL/selfcare, F/U by [facility] for home recommendations. Monitor HR & O2.  OT Recommendation/Assessment: Patient will need skilled OT in the acute care venue OT Problem List: Decreased activity tolerance;Decreased knowledge of use of DME or AE OT Therapy Diagnosis : Generalized weakness;Acute pain OT Plan OT Frequency: Min 2X/week OT Treatment/Interventions: Self-care/ADL training;Therapeutic activities;Energy conservation;DME and/or AE instruction;Patient/family education OT Recommendation Follow Up Recommendations: Home health OT Equipment Recommended: Tub/shower bench OT Goals Acute Rehab OT Goals OT Goal Formulation: With patient Time For Goal Achievement: 7 days ADL Goals Pt Will Perform Upper Body  Bathing: with modified independence;Sitting, chair;Sitting, edge of bed;Sitting at sink ADL Goal: Upper Body Bathing - Progress: Goal set today Pt Will Perform Lower Body Bathing: with modified independence;with adaptive equipment;Sitting, edge of bed;Sitting, chair;Sitting at sink ADL Goal: Lower Body Bathing - Progress: Goal set today Pt Will Perform Upper Body Dressing: with modified independence;Sitting, bed;Sitting, chair ADL Goal: Upper Body Dressing - Progress: Goal set today Pt Will Perform Lower Body Dressing: with modified independence;with adaptive equipment;Unsupported;Sit to stand from chair;Sit to stand from bed ADL Goal: Lower Body Dressing - Progress: Goal set today Pt Will Transfer to Toilet: with modified independence;with DME;Ambulation ADL Goal: Toilet Transfer - Progress: Goal set today Pt Will Perform Toileting - Clothing Manipulation: with modified independence;Sitting on 3-in-1 or toilet;Standing ADL Goal: Toileting - Clothing Manipulation - Progress: Goal set today Pt Will Perform Toileting - Hygiene: with modified independence;Sitting on 3-in-1 or toilet ADL Goal: Toileting - Hygiene - Progress: Goal set today Pt Will Perform Tub/Shower Transfer: with supervision;Transfer tub bench ADL Goal: Tub/Shower Transfer - Progress: Goal set today  OT Evaluation Precautions/Restrictions  Precautions Precautions: Fall Prior Functioning Home Living Lives With: Alone Type of Home: Apartment Home Layout: One level Home Access: Stairs to enter Entrance Stairs-Rails: Left Entrance Stairs-Number of Steps: 3 Bathroom Shower/Tub: Engineer, manufacturing systems: Handicapped height Home Adaptive Equipment: Straight cane;Walker - rolling;Grab bars in shower;Reacher;Sock aid Prior Function Level of Independence: Independent with basic ADLs;Independent with transfers;Needs assistance with homemaking ADL ADL Eating/Feeding: Simulated;Modified independent Where Assessed -  Eating/Feeding: Chair Grooming: Simulated;Modified independent;Set up Where Assessed - Grooming: Sitting, chair Upper Body Bathing: Simulated;Supervision/safety;Modified independent Where Assessed - Upper Body Bathing: Sitting, bed;Sitting, chair;Supported;Unsupported Lower Body Bathing:  Simulated;Minimal assistance Where Assessed - Lower Body Bathing: Sitting, bed;Sitting, chair;Supported;Unsupported Upper Body Dressing: Performed;Modified independent;Set up Where Assessed - Upper Body Dressing: Sitting, bed;Unsupported Lower Body Dressing: Performed;Minimal assistance Where Assessed - Lower Body Dressing: Unsupported;Sitting, chair;Sitting, bed Toilet Transfer: Performed;Minimal assistance Toilet Transfer Method: Ambulating Toilet Transfer Equipment: Raised toilet seat with arms (or 3-in-1 over toilet) Toileting - Clothing Manipulation: Performed;Minimal assistance Where Assessed - Toileting Clothing Manipulation: Standing;Sit to stand from 3-in-1 or toilet Toileting - Hygiene: Simulated;Modified independent Where Assessed - Toileting Hygiene: Sit on 3-in-1 or toilet Tub/Shower Transfer: Not assessed Equipment Used: Rolling walker;Other (comment) (3:1; Gait belt) ADL Comments: Pt currently requires assist w/ LB ADL & selfcare tasks as well funct mobility w/ RW, she lives alone and should benefit from acute OT to address decreased independence in areas of ADL/selfcare, F/U by [facility] for home recommendations.  Vision/Perception  Vision - History Baseline Vision: Wears glasses only for reading Patient Visual Report: No change from baseline Cognition Cognition Arousal/Alertness: Awake/alert Overall Cognitive Status: Appears within functional limits for tasks assessed Sensation/Coordination Sensation Light Touch: Appears Intact Coordination Gross Motor Movements are Fluid and Coordinated: Yes Fine Motor Movements are Fluid and Coordinated: Yes Extremity Assessment RUE Assessment RUE  Assessment: Within Functional Limits LUE Assessment LUE Assessment: Within Functional Limits Mobility  Bed Mobility Bed Mobility: Yes Supine to Sit: 4: Min assist Supine to Sit Details (indicate cue type and reason): Assist for trunk to upright. Increased time.  Sitting - Scoot to Edge of Bed: 4: Min assist Transfers Transfers: Yes Sit to Stand: 4: Min assist;From bed;From chair/3-in-1;With upper extremity assist;With armrests Sit to Stand Details (indicate cue type and reason): VCs safety, technique, hand placement. Assist to rise, stabilize.  Stand to Sit: 4: Min assist;To chair/3-in-1 Stand to Sit Details: Min-guard assist. VCs safety, technique, hand placement.    End of Session OT - End of Session Equipment Utilized During Treatment: Gait belt;Other (comment) (RW; 3:1) Activity Tolerance: Patient tolerated treatment well;Other (comment) (Monitor Heart Rate) Patient left: in chair;with call bell in reach;Other (comment) (Chair alarm) General Behavior During Session: [facility] for tasks performed Cognition: [facility] for tasks performed   Alm Bustard 11/13/2011, 10:59 AM

## 2011-11-13 NOTE — Evaluation (Signed)
Physical Therapy Evaluation Patient Details Name: [name] MRN: [id number] DOB: 11-02-1934 Today's Date: 11/13/2011 Time: 7829-5621  Eval II Problem List:  Patient Active Problem List  Diagnoses  . UTI (lower urinary tract infection)  . Fever  . Cough  . Failure to thrive in adult  . Weakness generalized  . Rheumatoid arthritis  . Type II or unspecified type diabetes mellitus without mention of complication, not stated as uncontrolled  . Pneumonia  . Adrenal insufficiency, primary, iatrogenic    Past Medical History:  Past Medical History  Diagnosis Date  . Osteoporosis   . Rheumatoid arthritis   . Murmur, cardiac   . Diabetes mellitus   . Pneumonia   . Anxiety   . Adrenal insufficiency, primary, iatrogenic   . Gait instability   . Physical deconditioning    Past Surgical History:  Past Surgical History  Procedure Date  . Knee arthroscopy     2 on left  . Cholecystectomy   . Thyroidectomy   . Abdominal hysterectomy     PT Assessment/Plan/Recommendation PT Assessment Clinical Impression Statement: Pt presents with diagnosis of Pna. Pt will benefit from skilled PT in the acute care setting to maximize independence and safety with basic functional mobility in preperation for D/C home. Recommend patient use RW for now.  PT Recommendation/Assessment: Patient will need skilled PT in the acute care venue PT Problem List: Decreased activity tolerance;Decreased mobility PT Therapy Diagnosis : Difficulty walking PT Plan PT Frequency: Min 3X/week PT Treatment/Interventions: DME instruction;Gait training;Functional mobility training;Therapeutic activities;Therapeutic exercise;Patient/family education PT Recommendation Follow Up Recommendations: Home health PT Equipment Recommended: None  PT Goals  Acute Rehab PT Goals PT Goal Formulation: With patient Pt will go Supine/Side to Sit: with modified independence PT Goal: Supine/Side to Sit - Progress: Goal set  today Pt will go Sit to Supine/Side: with modified independence PT Goal: Sit to Supine/Side - Progress: Goal set today Pt will go Sit to Stand: with modified independence PT Goal: Sit to Stand - Progress: Goal set today Pt will Ambulate: 51 - 150 feet;with least restrictive assistive device;with modified independence PT Goal: Ambulate - Progress: Goal set today  PT Evaluation Precautions/Restrictions  Precautions Precautions: Fall Prior Functioning  Home Living Lives With: Alone Type of Home: Apartment Home Layout: One level Home Access: Stairs to enter Entrance Stairs-Rails: Left Entrance Stairs-Number of Steps: 3 Bathroom Shower/Tub: Engineer, manufacturing systems: Handicapped height Home Adaptive Equipment: Straight cane;Walker - rolling;Grab bars in shower;Reacher;Sock aid Prior Function Level of Independence: Independent with basic ADLs;Independent with transfers;Needs assistance with homemaking Cognition Cognition Arousal/Alertness: Awake/alert Overall Cognitive Status: Appears within functional limits for tasks assessed Sensation/Coordination Sensation Light Touch: Appears Intact Coordination Gross Motor Movements are Fluid and Coordinated: Yes Fine Motor Movements are Fluid and Coordinated: Yes Extremity Assessment  RLE Strength RLE Overall Strength Comments: Strength > or = 4/5 LLE Strength LLE Overall Strength Comments: Strength > or = 4/5 Mobility (including Balance) Bed Mobility Bed Mobility: Yes Supine to Sit: 4: Min assist Supine to Sit Details (indicate cue type and reason): Assist for trunk to upright. Increased time.  Sitting - Scoot to Edge of Bed: 4: Min assist Transfers Transfers: Yes Sit to Stand: 4: Min assist;From bed;From chair/3-in-1;With upper extremity assist;With armrests Sit to Stand Details (indicate cue type and reason): VCs safety, technique, hand placement. Assist to rise, stabilize.  Stand to Sit: 4: Min assist;To  chair/3-in-1 Stand to Sit Details: Min-guard assist. VCs safety, technique, hand placement.  Ambulation/Gait Ambulation/Gait: Yes Ambulation/Gait  Assistance Details (indicate cue type and reason): Min-guard assist. VCs safety.  Ambulation Distance (Feet): 15 Feet (x 2.) Assistive device: Rolling walker Gait Pattern: Step-through pattern    Exercise    End of Session PT - End of Session Equipment Utilized During Treatment: Gait belt Activity Tolerance: Patient tolerated treatment well Patient left: in chair;with call bell in reach (with chair alarm) General Behavior During Session: [facility] for tasks performed Cognition: [facility] for tasks performed  Rebeca Alert [facility] 11/13/2011, 10:55 AM [phone]

## 2011-11-14 LAB — GLUCOSE, CAPILLARY: Glucose-Capillary: 149 mg/dL — ABNORMAL HIGH (ref 70–99)

## 2011-11-14 MED ORDER — OXYMETAZOLINE HCL 0.05 % NA SOLN
1.0000 | Freq: Two times a day (BID) | NASAL | Status: DC
  Administered 2011-11-14 – 2011-11-18 (×7): 1 via NASAL
  Filled 2011-11-14: qty 15

## 2011-11-14 MED ORDER — VANCOMYCIN HCL 500 MG IV SOLR
500.0000 mg | Freq: Two times a day (BID) | INTRAVENOUS | Status: DC
  Administered 2011-11-14 – 2011-11-18 (×8): 500 mg via INTRAVENOUS
  Filled 2011-11-14 (×12): qty 500

## 2011-11-14 NOTE — Progress Notes (Signed)
Subjective: She continues to have a dry cough today with sinus congestion. She had a moderately severe headache last night. Currently the headache is mild and not associated with vision changes, nausea, or vomiting. She is being well and not having problems with diarrhea or constipation. Objective: Vital signs in last 24 hours: Temp:  [97.5 F (36.4 C)-98.1 F (36.7 C)] 97.7 F (36.5 C) (01/24 0637) Pulse Rate:  [92-118] 104  (01/24 0637) Resp:  [18-20] 20  (01/24 0637) BP: (100-114)/(65-75) 100/65 mmHg (01/24 0637) SpO2:  [97 %-99 %] 97 % (01/24 7846) Weight change:    Intake/Output from previous day: 01/23 0701 - 01/24 0700 In: 2180 [P.O.:840; I.V.:1190; IV Piggyback:150] Out: 1475 [Urine:1475]   General appearance: alert, cooperative and no distress Nose: Nares normal. Septum midline. Mucosa normal. No drainage or sinus tenderness., mild congestion Resp: clear to auscultation bilaterally Cardio: regular rate and rhythm, S1, S2 normal, no murmur, click, rub or gallop Extremities: extremities normal, atraumatic, no cyanosis or edema Neurologic: Grossly normal  Lab Results:  Basename 11/13/11 0436 11/11/11 2112  WBC 10.1 15.0*  HGB 10.6* 14.1  HCT 32.3* 40.6  PLT 209 237   BMET  Basename 11/13/11 0436 11/11/11 2112  NA 135 134*  K 3.5 3.9  CL 105 95*  CO2 26 21  GLUCOSE 120* 122*  BUN 8 20  CREATININE 0.55 0.61  CALCIUM 7.7* 9.1   CMET CMP     Component Value Date/Time   NA 135 11/13/2011 0436   K 3.5 11/13/2011 0436   CL 105 11/13/2011 0436   CO2 26 11/13/2011 0436   GLUCOSE 120* 11/13/2011 0436   BUN 8 11/13/2011 0436   CREATININE 0.55 11/13/2011 0436   CALCIUM 7.7* 11/13/2011 0436   PROT 5.2* 11/13/2011 0436   ALBUMIN 2.1* 11/13/2011 0436   AST 24 11/13/2011 0436   ALT 16 11/13/2011 0436   ALKPHOS 61 11/13/2011 0436   BILITOT 0.2* 11/13/2011 0436   GFRNONAA >90 11/13/2011 0436   GFRAA >90 11/13/2011 0436    CBG (last 3)   Basename 11/13/11 2110 11/13/11 1729  11/13/11 1158  GLUCAP 186* 181* 185*    INR RESULTS:   Lab Results  Component Value Date   INR 0.87 09/16/2011   INR 2.6* 05/12/2008   INR 2.3* 05/11/2008     Studies/Results: No results found.  Medications: I have reviewed the patient's current medications.  Assessment/Plan: #1 pneumonia: Clinically stable on current medications with interval decrease in white blood cell count. We will continue current medications, with the exception that with the influenza PCR test negative we will stop Tamiflu. #2 diabetes mellitus, type II: Stable on current medications #3 hypocalcemia: Likely due to hypoalbuminemia. We will check an ionized calcium level and an intact PTH level. #4 gait instability: From advanced arthritis. We will ensure that she ambulates on the ward with assistance and have physical therapy continue to work with her.  LOS: 3 days   Meagan Spease G 11/14/2011, 8:15 AM

## 2011-11-14 NOTE — Progress Notes (Signed)
ANTIBIOTIC CONSULT NOTE - FOLLOW UP  Pharmacy Consult for Vanco Indication: pneumonia  Allergies  Allergen Reactions  . Penicillins Hives    Patient Measurements: Height: 5\' 3"  (160 cm) Weight: 137 lb 8 oz (62.37 kg) IBW/kg (Calculated) : 52.4   Vital Signs: Temp: 97.6 F (36.4 C) (01/24 1423) Temp src: Oral (01/24 1423) BP: 105/77 mmHg (01/24 1423) Pulse Rate: 102  (01/24 1423) Intake/Output from previous day: 01/23 0701 - 01/24 0700 In: 2180 [P.O.:840; I.V.:1190; IV Piggyback:150] Out: 1475 [Urine:1475] Intake/Output from this shift: Total I/O In: 1450 [P.O.:600; I.V.:450; IV Piggyback:400] Out: 850 [Urine:850]  Labs:  [facility] 11/13/11 0436 11/11/11 2112  WBC 10.1 15.0*  HGB 10.6* 14.1  PLT 209 237  LABCREA -- --  CREATININE 0.55 0.61   Estimated Creatinine Clearance: 51 ml/min (by C-G formula based on Cr of 0.55).  No results found for this basename: VANCOTROUGH:2,VANCOPEAK:2,VANCORANDOM:2,GENTTROUGH:2,GENTPEAK:2,GENTRANDOM:2,TOBRATROUGH:2,TOBRAPEAK:2,TOBRARND:2,AMIKACINPEAK:2,AMIKACINTROU:2,AMIKACIN:2, in the last 72 hours   Microbiology: Recent Results (from the past 720 hour(s))  CULTURE, BLOOD (ROUTINE X 2)     Status: Normal (Preliminary result)   Collection Time   11/12/11  1:18 AM      Component Value Range Status Comment   Specimen Description BLOOD LAC   Final    Special Requests BOTTLES DRAWN AEROBIC AND ANAEROBIC   Final    Setup Time [id number]   Final    Culture     Final    Value:        BLOOD CULTURE RECEIVED NO GROWTH TO DATE CULTURE WILL BE HELD FOR 5 DAYS BEFORE ISSUING A FINAL NEGATIVE REPORT   Report Status PENDING   Incomplete   CULTURE, BLOOD (ROUTINE X 2)     Status: Normal (Preliminary result)   Collection Time   11/12/11  1:23 AM      Component Value Range Status Comment   Specimen Description BLOOD LEFT HAND   Final    Special Requests BOTTLES DRAWN AEROBIC AND ANAEROBIC   Final    Setup Time [id number]   Final    Culture     Final    Value:        BLOOD CULTURE RECEIVED NO GROWTH TO DATE CULTURE WILL BE HELD FOR 5 DAYS BEFORE ISSUING A FINAL NEGATIVE REPORT   Report Status PENDING   Incomplete     Anti-infectives     Start     Dose/Rate Route Frequency Ordered Stop   11/13/11 0800   vancomycin (VANCOCIN) IVPB 1000 mg/200 mL premix        1,000 mg 200 mL/hr over 60 Minutes Intravenous Every 24 hours 11/12/11 0839     11/12/11 1000   oseltamivir (TAMIFLU) capsule 75 mg  Status:  Discontinued        75 mg Oral 2 times daily 11/12/11 0853 11/14/11 0825   11/12/11 0500   Levofloxacin (LEVAQUIN) IVPB 750 mg        750 mg 100 mL/hr over 90 Minutes Intravenous Every 24 hours 11/12/11 0456     11/12/11 0445   vancomycin (VANCOCIN) IVPB 1000 mg/200 mL premix        1,000 mg 200 mL/hr over 60 Minutes Intravenous  Once 11/12/11 0442 11/12/11 0611   11/12/11 0445   piperacillin-tazobactam (ZOSYN) IVPB 3.375 g  Status:  Discontinued        3.375 g 12.5 mL/hr over 240 Minutes Intravenous  Once 11/12/11 0442 11/12/11 0457          Assessment: Day #  3 Vanco 1g IV q24h. SCr 0.55, CrCl 71ml/min. Will empirically increase Vanco.  Goal of Therapy:  Vancomycin trough level 15-20 mcg/ml  Plan:  1) Increase Vanco to 500mg  IV q12h  Annia Belt 11/14/2011,2:59 PM

## 2011-11-14 NOTE — Progress Notes (Signed)
Pt is known to CSW from previous admission. CSW met with the pt and completed the psychosocial assessment (pls see shadow chart). Pt reports she lives at home alone, but she has great support from her son and daughter in law. Pt is agreeable to CSW faxing out clinicals to see what bed offers are given, but she states she is not 100% happy about going to SNF, due to a bad experience she had before at [facility].  Pt reports she will follow up with Dr. Silvano Rusk recommendation and if he states that she needs SNF she will most likely go. CSW will complete a FL-2 and place in the chart. CSW will continue to follow the pt and offer support.  Patrice Paradise, LCSWA 11/14/2011 1:47 PM [phone]

## 2011-11-14 NOTE — Plan of Care (Signed)
Problem: Discharge Progression Outcomes Goal: Activity appropriate for discharge plan Outcome: Not Progressing Discussed D/C plan with pt who feels she cannot manage at home alone at this time. Pt is agreeable to ST rehab at SNF to regain independence. PT now recommending ST rehab at SNF.

## 2011-11-14 NOTE — Progress Notes (Signed)
Physical Therapy Treatment Patient Details Name: [name] MRN: [id number] DOB: [birth year] Today's Date: 11/14/2011 Time: 1G, 1IPC PT Assessment/Plan  PT - Assessment/Plan Comments on Treatment Session: Disussed D/C with pt who does not feel she can manage at home alone at this time. Pt does not want SNF but is agreeable to go, if needed. Feel ST rehab may be beneficial in maximizing pt's independence.  PT Plan: Discharge plan needs to be updated PT Frequency: Min 3X/week Follow Up Recommendations: Skilled nursing facility Equipment Recommended: Defer to next venue PT Goals  Acute Rehab PT Goals PT Goal: Supine/Side to Sit - Progress: Progressing toward goal PT Goal: Sit to Supine/Side - Progress: Progressing toward goal PT Goal: Sit to Stand - Progress: Progressing toward goal PT Goal: Ambulate - Progress: Progressing toward goal  PT Treatment Precautions/Restrictions  Precautions Precautions: Fall Mobility (including Balance) Bed Mobility Bed Mobility: Yes Supine to Sit: HOB elevated (Comment degrees);With rails Supine to Sit Details (indicate cue type and reason): Min-guard assist. VCs technique, hand placement. Increased time. Some difficulty with getting trunk to upright position. Moderate reliance on rail.  Transfers Transfers: Yes Sit to Stand: 4: Min assist;From bed;With upper extremity assist Sit to Stand Details (indicate cue type and reason): VCs safety, technique, hand placement. Assist to rise, stabilize.  Stand to Sit: 4: Min assist Stand to Sit Details: VCs safety, technique, hand placement. Assist to control desent Ambulation/Gait Ambulation/Gait Assistance Details (indicate cue type and reason): VCs safety. Min-guard assist. Fatigues fairly easily.  Ambulation Distance (Feet): 125 Feet Assistive device: Rolling walker Gait Pattern: Decreased step length - right;Decreased step length - left;Decreased stride length    Exercise    End of Session PT - End  of Session Equipment Utilized During Treatment: Gait belt Activity Tolerance: Patient limited by fatigue Patient left: in chair;with call bell in reach General Behavior During Session: [facility] for tasks performed Cognition: [facility] for tasks performed  Rebeca Alert [facility] 11/14/2011, 11:36 AM [phone]

## 2011-11-15 ENCOUNTER — Inpatient Hospital Stay (HOSPITAL_COMMUNITY): Payer: Medicare Other

## 2011-11-15 LAB — COMPREHENSIVE METABOLIC PANEL
ALT: 19 U/L (ref 0–35)
AST: 18 U/L (ref 0–37)
Albumin: 2.2 g/dL — ABNORMAL LOW (ref 3.5–5.2)
Alkaline Phosphatase: 66 U/L (ref 39–117)
CO2: 35 mEq/L — ABNORMAL HIGH (ref 19–32)
Chloride: 104 mEq/L (ref 96–112)
Creatinine, Ser: 0.63 mg/dL (ref 0.50–1.10)
GFR calc non Af Amer: 86 mL/min — ABNORMAL LOW (ref >90)
Potassium: 3.9 mEq/L (ref 3.5–5.1)
Sodium: 142 mEq/L (ref 135–145)
Total Bilirubin: 0.2 mg/dL — ABNORMAL LOW (ref 0.3–1.2)

## 2011-11-15 LAB — CBC
MCV: 101.2 fL — ABNORMAL HIGH (ref 78.0–100.0)
Platelets: 242 10*3/uL (ref 150–400)
RBC: 3.37 MIL/uL — ABNORMAL LOW (ref 3.87–5.11)
WBC: 12 10*3/uL — ABNORMAL HIGH (ref 4.0–10.5)

## 2011-11-15 LAB — GLUCOSE, CAPILLARY
Glucose-Capillary: 116 mg/dL — ABNORMAL HIGH (ref 70–99)
Glucose-Capillary: 274 mg/dL — ABNORMAL HIGH (ref 70–99)
Glucose-Capillary: 160 mg/dL — ABNORMAL HIGH (ref 70–99)
Glucose-Capillary: 146 mg/dL — ABNORMAL HIGH (ref 70–99)

## 2011-11-15 LAB — CALCIUM, IONIZED: Calcium, Ion: 1.26 mmol/L (ref 1.12–1.32)

## 2011-11-15 NOTE — Progress Notes (Signed)
Subjective: She had a difficult time last night because she had a severe bifrontal headache that she graded at intensity 15 out of 10.  She did not have nausea, vomiting, or changes in her vision with her headache.  The physical therapy and occupational therapy consults feel that she is weak and needs short-term skilled nursing facility rehabilitation.  She continues to have a cough that is dry.  Her appetite is good.  Objective: Vital signs in last 24 hours: Temp:  [97.6 F (36.4 C)-98.5 F (36.9 C)] 98.3 F (36.8 C) (01/25 0516) Pulse Rate:  [102-122] 111  (01/25 0516) Resp:  [18-20] 20  (01/25 0516) BP: (105-118)/(72-79) 118/79 mmHg (01/25 0516) SpO2:  [90 %-99 %] 97 % (01/25 0516) Weight change:    Intake/Output from previous day: 01/24 0701 - 01/25 0700 In: 3390 [P.O.:840; I.V.:2050; IV Piggyback:500] Out: 1250 [Urine:1250]   General appearance: alert, cooperative and no distress Nose: mild rhinitis Neck: no adenopathy, no carotid bruit, no JVD, supple, symmetrical, trachea midline and thyroid not enlarged, symmetric, no tenderness/mass/nodules Resp: clear to auscultation bilaterally Cardio: heart rate currently is normal, in the range of 75-85 Extremities: extremities normal, atraumatic, no cyanosis or edema Neurologic: Grossly normal  Lab Results:  Basename 11/15/11 0445 11/13/11 0436  WBC 12.0* 10.1  HGB 10.9* 10.6*  HCT 34.1* 32.3*  PLT 242 209   BMET  Basename 11/15/11 0445 11/13/11 0436  NA 142 135  K 3.9 3.5  CL 104 105  CO2 35* 26  GLUCOSE 92 120*  BUN 7 8  CREATININE 0.63 0.55  CALCIUM 9.1 7.7*   CMET CMP     Component Value Date/Time   NA 142 11/15/2011 0445   K 3.9 11/15/2011 0445   CL 104 11/15/2011 0445   CO2 35* 11/15/2011 0445   GLUCOSE 92 11/15/2011 0445   BUN 7 11/15/2011 0445   CREATININE 0.63 11/15/2011 0445   CALCIUM 9.1 11/15/2011 0445   PROT 5.5* 11/15/2011 0445   ALBUMIN 2.2* 11/15/2011 0445   AST 18 11/15/2011 0445   ALT 19 11/15/2011  0445   ALKPHOS 66 11/15/2011 0445   BILITOT 0.2* 11/15/2011 0445   GFRNONAA 86* 11/15/2011 0445   GFRAA >90 11/15/2011 0445    CBG (last 3)   Basename 11/15/11 0802 11/14/11 2125 11/14/11 1708  GLUCAP 116* 161* 168*    INR RESULTS:   Lab Results  Component Value Date   INR 0.87 09/16/2011   INR 2.6* 05/12/2008   INR 2.3* 05/11/2008     Studies/Results: No results found.  Medications: I have reviewed the patient's current medications.  Assessment/Plan: #1 pneumonia: Slowly improving.  Her white blood cell count shows an interval increase of uncertain cause.  This could be related to her headache pain.  I will see if the pharmacist can review past records to see if she has tolerated cephalosporins in the past, as she may need to be switched from Levaquin to Rocephin. #2 headache: Moderately severe without focal neurologic deficits and possibly from sinusitis.  We'll check a CT scan of the head without IV contrast today. #3 physical deconditioning: Moderately severe and will need skilled nursing facility rehabilitation.  I signed the FL2 form today. #4 diabetes mellitus, type II: Stable on current medications. #5 adrenal insufficiency: Stable on prednisone  LOS: 4 days   [name] 11/15/2011, 8:49 AM

## 2011-11-16 LAB — GLUCOSE, CAPILLARY
Glucose-Capillary: 239 mg/dL — ABNORMAL HIGH (ref 70–99)
Glucose-Capillary: 194 mg/dL — ABNORMAL HIGH (ref 70–99)

## 2011-11-16 NOTE — Progress Notes (Signed)
Subjective: Patient is seen sitting today in the side of the bed. She actually relates she feels well and has no complaints. The headache noted yesterday is not verbalized. She is eagerly awaiting breakfast. She relates that her breathing is much better.  Objective: Vital signs in last 24 hours: Temp:  [97.4 F (36.3 C)-97.9 F (36.6 C)] 97.9 F (36.6 C) (01/26 0509) Pulse Rate:  [97-117] 97  (01/26 0509) Resp:  [18-19] 18  (01/26 0509) BP: (109-142)/(72-76) 109/73 mmHg (01/26 0509) SpO2:  [94 %-97 %] 94 % (01/26 0509) Weight change:   CBG (last 3)   Basename 11/15/11 2200 11/15/11 1658 11/15/11 1153  GLUCAP 146* 160* 274*    Intake/Output from previous day: 01/25 0701 - 01/26 0700 In: 1930 [P.O.:480; I.V.:1100; IV Piggyback:350] Out: 1000 [Urine:1000]  Physical Exam: Patient is awake alert in no distress wearing her oxygen and transferring herself from the bedside commode to the bed. She is no JVD or bruits. Lungs are clear except for a few rales at the right base. Cardiovascular exam regular rate in the 80s no obvious murmur. Back is kyphotic. Abdomen is benign. Extremities are without edema intact distal pulses. Neurologically she is intact.   Lab Results:  [facility] 11/15/11 0445  NA 142  K 3.9  CL 104  CO2 35*  GLUCOSE 92  BUN 7  CREATININE 0.63  CALCIUM 9.1  MG --  PHOS --    Basename 11/15/11 0445  AST 18  ALT 19  ALKPHOS 66  BILITOT 0.2*  PROT 5.5*  ALBUMIN 2.2*    Basename 11/15/11 0445  WBC 12.0*  NEUTROABS --  HGB 10.9*  HCT 34.1*  MCV 101.2*  PLT 242   Lab Results  Component Value Date   INR 0.87 09/16/2011   INR 2.6* 05/12/2008   INR 2.3* 05/11/2008   No results found for this basename: CKTOTAL:3,CKMB:3,CKMBINDEX:3,TROPONINI:3 in the last 72 hours No results found for this basename: TSH,T4TOTAL,FREET3,T3FREE,THYROIDAB in the last 72 hours No results found for this basename: VITAMINB12:2,FOLATE:2,FERRITIN:2,TIBC:2,IRON:2,RETICCTPCT:2 in  the last 72 hours  Studies/Results: Ct Head Wo Contrast  11/15/2011  *RADIOLOGY REPORT*  Clinical Data: 77 year old female with severe frontal headache. Weakness.  Possible sinusitis.  CT HEAD WITHOUT CONTRAST  Technique:  Contiguous axial images were obtained from the base of the skull through the vertex without contrast.  Comparison: 11/11/2011 and earlier.  Findings: Progression of paranasal sinus opacification with bubbly opacity in the right frontal sinus and left sphenoid fluid level. The frontal sinuses are relatively spared.  The mastoids are relatively spared.  Osteopenia. No acute osseous abnormality identified.  No acute orbit or scalp soft tissue findings.  Probable dilated perivascular space at the inferior right basal ganglia.  Stable patchy and scattered cerebral white matter hypodensity.  No ventriculomegaly. No midline shift, mass effect, or evidence of mass lesion.  No acute intracranial hemorrhage identified.  No evidence of cortically based acute infarction identified.  No suspicious intracranial vascular hyperdensity.  IMPRESSION: 1. No acute intracranial abnormality.  Stable nonspecific white matter changes. 2.  Increased paranasal sinus opacification with fluid levels and bubbly opacity compatible with acute sinusitis.  Original Report Authenticated By: Harley Hallmark, M.D.     Assessment/Plan: #1 pneumonia medically much improved with no fever and no pulmonary symptoms. She does continue on Levaquin at this point and is currently day 5  #2 sinusitis by CT the Levaquin should handle this nicely she has no complaints of headaches today  #3 diabetes mellitus type 2  CABG is acceptable diet fair  #4 adrenal insufficiency on chronic replacement  Plan continue antibiotics, nutrition, pulmonary toilet.   LOS: 5 days   Elara Cocke A 11/16/2011, 8:00 AM

## 2011-11-17 DIAGNOSIS — J0130 Acute sphenoidal sinusitis, unspecified: Secondary | ICD-10-CM | POA: Diagnosis present

## 2011-11-17 LAB — GLUCOSE, CAPILLARY: Glucose-Capillary: 221 mg/dL — ABNORMAL HIGH (ref 70–99)

## 2011-11-17 MED ORDER — LEVOFLOXACIN 750 MG PO TABS: 750.0000 mg | ORAL_TABLET | Freq: Every day | ORAL | Status: AC

## 2011-11-17 MED ORDER — OXYMETAZOLINE HCL 0.05 % NA SOLN: 1.0000 | Freq: Two times a day (BID) | NASAL | Status: AC

## 2011-11-17 MED ORDER — SENNOSIDES-DOCUSATE SODIUM 8.6-50 MG PO TABS: 1.0000 | ORAL_TABLET | Freq: Every evening | ORAL | Status: DC | PRN

## 2011-11-17 MED ORDER — ALUM & MAG HYDROXIDE-SIMETH 200-200-20 MG/5ML PO SUSP: 30.0000 mL | Freq: Four times a day (QID) | ORAL | Status: AC | PRN

## 2011-11-17 MED ORDER — LEVOFLOXACIN 750 MG PO TABS
750.0000 mg | ORAL_TABLET | Freq: Every day | ORAL | Status: DC
  Administered 2011-11-17 – 2011-11-18 (×2): 750 mg via ORAL
  Filled 2011-11-17 (×2): qty 1

## 2011-11-17 NOTE — Progress Notes (Signed)
Clinical Social Work with weekend coverage met with patient with regards to dc planning and disposition to SNF.  Patient is not agreeable at this time to go to SNF.  She pleasantly declines the bed offers and says she feels good and wants to go home.  CSW discussed benefits of SNF, safety, and need to continued care to keep her well and out of the hospital.  Patient is fully aware of providers and treatment team wanting her to go to SNF, however she feels she can manage and wants to go home.  CSW does have bed offers, will update weekday CSW and follow up on Monday.  Ashley Jacobs, MSW LCSW [phone] Weekend coverage [phone]

## 2011-11-17 NOTE — Progress Notes (Signed)
Subjective: Patient is seen sitting today in the side of the bed. She actually relates she feels well and has no complaints. The headache noted yesterday is not verbalized. She is eagerly awaiting breakfast. She relates that her breathing is much better.   Objective: Vital signs in last 24 hours: Temp:  [97.4 F (36.3 C)-97.8 F (36.6 C)] 97.5 F (36.4 C) (01/27 0628) Pulse Rate:  [92-111] 111  (01/27 0628) Resp:  [18] 18  (01/27 0628) BP: (107-111)/(70-73) 108/70 mmHg (01/27 0628) SpO2:  [93 %-97 %] 93 % (01/27 0628) Weight change:   CBG (last 3)   Basename 11/16/11 2140 11/16/11 1705 11/16/11 1150  GLUCAP 194* 239* 173*    Intake/Output from previous day: 01/26 0701 - 01/27 0700 In: 1516 [P.O.:1200; I.V.:216; IV Piggyback:100] Out: 1500 [Urine:1500]  Physical Exam:  Patient is awake alert in no distress wearing her oxygen and transferring herself from the bedside commode to the bed. She is no JVD or bruits. Lungs are clear except for a few rales at the right base. Cardiovascular exam regular rate in the 80s no obvious murmur. Back is kyphotic. Abdomen is benign. Extremities are without edema intact distal pulses. Neurologically she is intact.     Lab Results:  [facility] 11/15/11 0445  NA 142  K 3.9  CL 104  CO2 35*  GLUCOSE 92  BUN 7  CREATININE 0.63  CALCIUM 9.1  MG --  PHOS --    Basename 11/15/11 0445  AST 18  ALT 19  ALKPHOS 66  BILITOT 0.2*  PROT 5.5*  ALBUMIN 2.2*    Basename 11/15/11 0445  WBC 12.0*  NEUTROABS --  HGB 10.9*  HCT 34.1*  MCV 101.2*  PLT 242   Lab Results  Component Value Date   INR 0.87 09/16/2011   INR 2.6* 05/12/2008   INR 2.3* 05/11/2008   No results found for this basename: CKTOTAL:3,CKMB:3,CKMBINDEX:3,TROPONINI:3 in the last 72 hours No results found for this basename: TSH,T4TOTAL,FREET3,T3FREE,THYROIDAB in the last 72 hours No results found for this basename: VITAMINB12:2,FOLATE:2,FERRITIN:2,TIBC:2,IRON:2,RETICCTPCT:2  in the last 72 hours  Studies/Results: Ct Head Wo Contrast  11/15/2011  *RADIOLOGY REPORT*  Clinical Data: 77 year old female with severe frontal headache. Weakness.  Possible sinusitis.  CT HEAD WITHOUT CONTRAST  Technique:  Contiguous axial images were obtained from the base of the skull through the vertex without contrast.  Comparison: 11/11/2011 and earlier.  Findings: Progression of paranasal sinus opacification with bubbly opacity in the right frontal sinus and left sphenoid fluid level. The frontal sinuses are relatively spared.  The mastoids are relatively spared.  Osteopenia. No acute osseous abnormality identified.  No acute orbit or scalp soft tissue findings.  Probable dilated perivascular space at the inferior right basal ganglia.  Stable patchy and scattered cerebral white matter hypodensity.  No ventriculomegaly. No midline shift, mass effect, or evidence of mass lesion.  No acute intracranial hemorrhage identified.  No evidence of cortically based acute infarction identified.  No suspicious intracranial vascular hyperdensity.  IMPRESSION: 1. No acute intracranial abnormality.  Stable nonspecific white matter changes. 2.  Increased paranasal sinus opacification with fluid levels and bubbly opacity compatible with acute sinusitis.  Original Report Authenticated By: Harley Hallmark, M.D.     Assessment/Plan: 1 pneumonia much improved continues on oxygen will convert to oral antibiotics and approach disposition   #2 sinusitis by CT scan again Levaquin should handle this nicely she's having no headaches for at least 48 hours now  #3 these mellitus type II well-controlled  given illness and steroids  #4 adrenal insufficiency on chronic steroid replacement  Plan will mobilize convert to orals and ultimately disposition per primary physician   LOS: 6 days   Khaden Gater A 11/17/2011, 9:46 AM

## 2011-11-18 LAB — CULTURE, BLOOD (ROUTINE X 2)
Culture  Setup Time: 201301220900
Culture  Setup Time: 201301220900
Culture: NO GROWTH
Culture: NO GROWTH

## 2011-11-18 LAB — GLUCOSE, CAPILLARY
Glucose-Capillary: 88 mg/dL (ref 70–99)
Glucose-Capillary: 154 mg/dL — ABNORMAL HIGH (ref 70–99)

## 2011-11-18 LAB — PTH, INTACT AND CALCIUM: Calcium, Total (PTH): 8.6 mg/dL (ref 8.4–10.5)

## 2011-11-18 MED ORDER — BENZONATATE 100 MG PO CAPS: 100.0000 mg | ORAL_CAPSULE | Freq: Three times a day (TID) | ORAL | Status: DC | PRN

## 2011-11-18 NOTE — Discharge Summary (Signed)
Physician Discharge Summary  Patient ID: BELVIA GOTSCHALL MRN: [id number] DOB/AGE: 26-Jun-1935 77 y.o.  Admit date: 11/11/2011 Discharge date: 11/18/2011   Discharge Diagnoses:  Principal Problem:  *Pneumonia Active Problems:  Sinusitis, acute, sphenoidal  UTI (lower urinary tract infection)  Weakness generalized  Type II or unspecified type diabetes mellitus without mention of complication, not stated as uncontrolled  Adrenal insufficiency, primary, iatrogenic  Cough  Rheumatoid arthritis   Discharged Condition: good  Hospital Course: The patient is a 77 year old Caucasian woman who is well-known to me. She had recently been admitted to the hospital with a urinary tract infection. During that recent hospitalization she developed pneumonia with pleural effusion. She was somewhat weak, but gradually her strength improved so that she was able to be discharged to home in late December 2012. At followup evaluation office visit about one week ago she was doing well. Over the past few days she's had increasing the productive cough with chills and weakness. She also had several minor falls without injury. On the night prior to admission she apparently had a fall and screamed out, so her neighbor called 911. She was transported to the emergency room with workup was remarkable for mildly elevated white blood cell count, bacteriuria, and a CT scan of the chest showing multifocal patchy airspace disease suggestive of multifocal pneumonia. She was admitted for further evaluation.  She was started on IV vancomycin and Levaquin for broad-spectrum coverage. She did well with this initially but developed severe headache leading to a CT scan of the head that showed acute sphenoid sinusitis, so oxymetolazone was added to her regimen and her headaches resolved. She has several medical problems including iatrogenic adrenal insufficiency from chronic prednisone use for which a brief rehabilitation the skilled nursing  facility was recommended, but declined by the patient. On the day of discharge she continued to have a mild cough is generally dry, and not associated with shortness of breath. She was no longer having headaches, fever, chills, and was eating well with no diarrhea. Procedures during this hospitalization included a CT scan of the cervical spine, CT scan of the head, CT angiogram of the chest, and there were no complications.  Consults: None  Significant Diagnostic Studies:  No results found.  Labs: Lab Results  Component Value Date   WBC 12.0* 11/15/2011   HGB 10.9* 11/15/2011   HCT 34.1* 11/15/2011   MCV 101.2* 11/15/2011   PLT 242 11/15/2011     Lab 11/15/11 0445  NA 142  K 3.9  CL 104  CO2 35*  BUN 7  CREATININE 0.63  CALCIUM 9.1  PROT 5.5*  BILITOT 0.2*  ALKPHOS 66  ALT 19  AST 18  GLUCOSE 92       Lab Results  Component Value Date   INR 0.87 09/16/2011   INR 2.6* 05/12/2008   INR 2.3* 05/11/2008     Recent Results (from the past 240 hour(s))  CULTURE, BLOOD (ROUTINE X 2)     Status: Normal (Preliminary result)   Collection Time   11/12/11  1:18 AM      Component Value Range Status Comment   Specimen Description BLOOD LAC   Final    Special Requests BOTTLES DRAWN AEROBIC AND ANAEROBIC   Final    Setup Time [id number]   Final    Culture     Final    Value:        BLOOD CULTURE RECEIVED NO GROWTH TO DATE CULTURE WILL BE HELD FOR  5 DAYS BEFORE ISSUING A FINAL NEGATIVE REPORT   Report Status PENDING   Incomplete   CULTURE, BLOOD (ROUTINE X 2)     Status: Normal (Preliminary result)   Collection Time   11/12/11  1:23 AM      Component Value Range Status Comment   Specimen Description BLOOD LEFT HAND   Final    Special Requests BOTTLES DRAWN AEROBIC AND ANAEROBIC   Final    Setup Time [id number]   Final    Culture     Final    Value:        BLOOD CULTURE RECEIVED NO GROWTH TO DATE CULTURE WILL BE HELD FOR 5 DAYS BEFORE ISSUING A FINAL NEGATIVE REPORT    Report Status PENDING   Incomplete       Discharge Exam: Blood pressure 139/73, pulse 105, temperature 97.8 F (36.6 C), temperature source Oral, resp. rate 18, height 5\' 3"  (1.6 m), weight 62.37 kg (137 lb 8 oz), SpO2 94.00%.  Physical Exam: In general, she is a well-nourished well-developed Caucasian woman with an occasional dry cough but no shortness of breath. HEENT exam was significant for mild rhinitis, neck was supple without jugular venous distention or carotid bruit, chest was clear to auscultation, heart had a regular rate and rhythm without significant murmur or gallop, abdomen had normal bowel sounds and no tenderness, extremities were without edema. Neurologic exam was nonfocal. Gait was not assessed, however she has been ambulating in the hallways without assistance.  Disposition: she'll be discharged to home today. Her son and daughter-in-law plan to assist her with needs at home. She should have a followup visit with Dr. Jarome Matin at [facility] within 7-10 days following discharge. Should she develop worsening symptoms she should call our office or go to the emergency room for evaluation. She is aware that if she is too weak at home and she could call us for a possible direct admission to a skilled nursing facility within 30 days of hospital discharge.   Discharge Orders    Future Orders Please Complete By Expires   Diet - low sodium heart healthy      Increase activity slowly      Discharge instructions      Comments:   Call Dr. Silvano Rusk office for a followup visit in 7-10 days at [phone]. Call earlier if symptoms worsen, such as redevelopment of fever or symptoms of difficulty breathing, severe diarrhea, or severe weakness.     Medication List  As of 11/18/2011  8:04 AM   STOP taking these medications         predniSONE 5 MG tablet         TAKE these medications         alendronate 70 MG tablet   Commonly known as: FOSAMAX   Take 70 mg by  mouth every 7 (seven) days. Take with a full glass of water on an empty stomach.[name] on sundays per patient.      ALPRAZolam 1 MG tablet   Commonly known as: XANAX   Take 1 mg by mouth 3 (three) times daily. Patient states she takes 3 times a day every day per patient.      alum & mag hydroxide-simeth 200-200-20 MG/5ML suspension   Commonly known as: MAALOX/MYLANTA   Take 30 mLs by mouth every 6 (six) hours as needed (dyspepsia).      amitriptyline 25 MG tablet   Commonly known as: ELAVIL   Take 50  mg by mouth at bedtime.      benzonatate 100 MG capsule   Commonly known as: TESSALON   Take 100 mg by mouth 3 (three) times daily as needed. For cough      buPROPion 75 MG tablet   Commonly known as: WELLBUTRIN   Take 75 mg by mouth every morning.      HYDROcodone-acetaminophen 5-500 MG per tablet   Commonly known as: VICODIN   Take 1 tablet by mouth 3 (three) times daily as needed. For pain      hydroxypropyl methylcellulose 2.5 % ophthalmic solution   Commonly known as: ISOPTO TEARS   Place 1 drop into both eyes as needed. For dry eyes      levofloxacin 750 MG tablet   Commonly known as: LEVAQUIN   Take 1 tablet (750 mg total) by mouth daily.      lipase/protease/amylase 16109 UNITS Cpep   Commonly known as: CREON-10/PANCREASE   Take 1 capsule by mouth 3 (three) times daily before meals.      mirtazapine 30 MG tablet   Commonly known as: REMERON   Take 30 mg by mouth at bedtime.      oxymetazoline 0.05 % nasal spray   Commonly known as: AFRIN   Place 1 spray into the nose 2 (two) times daily.      predniSONE 20 MG tablet   Commonly known as: DELTASONE   Take 20 mg by mouth daily before breakfast.      senna-docusate 8.6-50 MG per tablet   Commonly known as: Senokot-S   Take 1 tablet by mouth at bedtime as needed for constipation.      sulfaSALAzine 500 MG tablet   Commonly known as: AZULFIDINE   Take 500 mg by mouth 2 (two) times daily.      Vitamin D  (Ergocalciferol) 50000 UNITS Caps   Commonly known as: DRISDOL   Take 50,000 Units by mouth every 7 (seven) days. Takes every Sunday per patient.           Follow-up Information    Schedule an appointment as soon as possible for a visit with [name], MD.   Contact information:   [address] [facility], [place]. [place] [address] [phone]          Signed: [name] 11/18/2011, 8:04 AM

## 2011-11-18 NOTE — Progress Notes (Signed)
Pt discharged to home with daughter. Refused SNF. MD aware of activity level of pt and can do a direct admit to SNF if pt chooses after she gets home. Julio Sicks RN

## 2012-01-16 ENCOUNTER — Inpatient Hospital Stay (HOSPITAL_COMMUNITY)
Admission: AD | Admit: 2012-01-16 | Discharge: 2012-01-18 | DRG: 552 | Disposition: A | Discharge status: Home / Self Care | Payer: Medicare Other | Source: Ambulatory Visit | Attending: Internal Medicine | Admitting: Internal Medicine

## 2012-01-16 ENCOUNTER — Encounter (HOSPITAL_COMMUNITY): Payer: Self-pay | Admitting: Orthopedic Surgery

## 2012-01-16 ENCOUNTER — Inpatient Hospital Stay (HOSPITAL_COMMUNITY): Payer: Medicare Other

## 2012-01-16 ENCOUNTER — Other Ambulatory Visit: Payer: Self-pay

## 2012-01-16 DIAGNOSIS — R531 Weakness: Secondary | ICD-10-CM | POA: Diagnosis present

## 2012-01-16 DIAGNOSIS — M545 Low back pain: Secondary | ICD-10-CM | POA: Diagnosis present

## 2012-01-16 DIAGNOSIS — X58XXXA Exposure to other specified factors, initial encounter: Secondary | ICD-10-CM | POA: Diagnosis present

## 2012-01-16 DIAGNOSIS — R011 Cardiac murmur, unspecified: Secondary | ICD-10-CM | POA: Diagnosis present

## 2012-01-16 DIAGNOSIS — Z8701 Personal history of pneumonia (recurrent): Secondary | ICD-10-CM

## 2012-01-16 DIAGNOSIS — M069 Rheumatoid arthritis, unspecified: Secondary | ICD-10-CM | POA: Diagnosis present

## 2012-01-16 DIAGNOSIS — D649 Anemia, unspecified: Secondary | ICD-10-CM | POA: Diagnosis present

## 2012-01-16 DIAGNOSIS — Z79899 Other long term (current) drug therapy: Secondary | ICD-10-CM

## 2012-01-16 DIAGNOSIS — R627 Adult failure to thrive: Secondary | ICD-10-CM

## 2012-01-16 DIAGNOSIS — IMO0002 Reserved for concepts with insufficient information to code with codable children: Secondary | ICD-10-CM

## 2012-01-16 DIAGNOSIS — R5381 Other malaise: Secondary | ICD-10-CM | POA: Diagnosis present

## 2012-01-16 DIAGNOSIS — E2749 Other adrenocortical insufficiency: Secondary | ICD-10-CM | POA: Diagnosis present

## 2012-01-16 DIAGNOSIS — M810 Age-related osteoporosis without current pathological fracture: Secondary | ICD-10-CM | POA: Diagnosis present

## 2012-01-16 DIAGNOSIS — E559 Vitamin D deficiency, unspecified: Secondary | ICD-10-CM | POA: Diagnosis present

## 2012-01-16 DIAGNOSIS — E119 Type 2 diabetes mellitus without complications: Secondary | ICD-10-CM | POA: Diagnosis present

## 2012-01-16 DIAGNOSIS — S32009A Unspecified fracture of unspecified lumbar vertebra, initial encounter for closed fracture: Principal | ICD-10-CM | POA: Diagnosis present

## 2012-01-16 DIAGNOSIS — F329 Major depressive disorder, single episode, unspecified: Secondary | ICD-10-CM | POA: Diagnosis present

## 2012-01-16 DIAGNOSIS — F411 Generalized anxiety disorder: Secondary | ICD-10-CM | POA: Diagnosis present

## 2012-01-16 DIAGNOSIS — G8929 Other chronic pain: Secondary | ICD-10-CM | POA: Diagnosis present

## 2012-01-16 DIAGNOSIS — K861 Other chronic pancreatitis: Secondary | ICD-10-CM | POA: Diagnosis present

## 2012-01-16 DIAGNOSIS — E2740 Unspecified adrenocortical insufficiency: Secondary | ICD-10-CM | POA: Diagnosis present

## 2012-01-16 DIAGNOSIS — R2681 Unsteadiness on feet: Secondary | ICD-10-CM | POA: Diagnosis present

## 2012-01-16 DIAGNOSIS — F3289 Other specified depressive episodes: Secondary | ICD-10-CM | POA: Diagnosis present

## 2012-01-16 DIAGNOSIS — R269 Unspecified abnormalities of gait and mobility: Secondary | ICD-10-CM | POA: Diagnosis present

## 2012-01-16 LAB — COMPREHENSIVE METABOLIC PANEL
ALT: 9 U/L (ref 0–35)
Albumin: 3.8 g/dL (ref 3.5–5.2)
Calcium: 9.6 mg/dL (ref 8.4–10.5)
GFR calc Af Amer: >90 mL/min (ref >90)
Glucose, Bld: 97 mg/dL (ref 70–99)
Sodium: 139 mEq/L (ref 135–145)
Total Protein: 6.5 g/dL (ref 6.0–8.3)

## 2012-01-16 LAB — CBC
Hemoglobin: 12.5 g/dL (ref 12.0–15.0)
MCH: 32 pg (ref 26.0–34.0)
MCHC: 32.8 g/dL (ref 30.0–36.0)
RDW: 14.8 % (ref 11.5–15.5)

## 2012-01-16 MED ORDER — POLYVINYL ALCOHOL 1.4 % OP SOLN
1.0000 [drp] | Freq: Three times a day (TID) | OPHTHALMIC | Status: DC
  Administered 2012-01-16 – 2012-01-18 (×3): 1 [drp] via OPHTHALMIC
  Filled 2012-01-16: qty 15

## 2012-01-16 MED ORDER — MIRTAZAPINE 30 MG PO TABS
30.0000 mg | ORAL_TABLET | Freq: Every day | ORAL | Status: DC
  Administered 2012-01-16 – 2012-01-17 (×2): 30 mg via ORAL
  Filled 2012-01-16 (×3): qty 1

## 2012-01-16 MED ORDER — ALUM & MAG HYDROXIDE-SIMETH 200-200-20 MG/5ML PO SUSP: 30.0000 mL | Freq: Four times a day (QID) | ORAL | Status: DC | PRN

## 2012-01-16 MED ORDER — PREDNISONE 20 MG PO TABS
20.0000 mg | ORAL_TABLET | Freq: Every day | ORAL | Status: DC
  Administered 2012-01-17 – 2012-01-18 (×2): 20 mg via ORAL
  Filled 2012-01-16 (×2): qty 1

## 2012-01-16 MED ORDER — HYDROCODONE-ACETAMINOPHEN 5-325 MG PO TABS
1.0000 | ORAL_TABLET | ORAL | Status: DC | PRN
  Administered 2012-01-16 – 2012-01-17 (×6): 2 via ORAL
  Filled 2012-01-16 (×5): qty 2
  Filled 2012-01-16: qty 1
  Filled 2012-01-16: qty 2

## 2012-01-16 MED ORDER — SULFASALAZINE 500 MG PO TABS
500.0000 mg | ORAL_TABLET | Freq: Two times a day (BID) | ORAL | Status: DC
  Administered 2012-01-16 – 2012-01-18 (×4): 500 mg via ORAL
  Filled 2012-01-16 (×5): qty 1

## 2012-01-16 MED ORDER — POTASSIUM CHLORIDE IN NACL 20-0.9 MEQ/L-% IV SOLN
INTRAVENOUS | Status: DC
  Administered 2012-01-16 – 2012-01-17 (×2): via INTRAVENOUS
  Filled 2012-01-16 (×4): qty 1000

## 2012-01-16 MED ORDER — ACETAMINOPHEN 650 MG RE SUPP: 650.0000 mg | Freq: Four times a day (QID) | RECTAL | Status: DC | PRN

## 2012-01-16 MED ORDER — HYPROMELLOSE (GONIOSCOPIC) 2.5 % OP SOLN: 1.0000 [drp] | Freq: Three times a day (TID) | OPHTHALMIC | Status: DC

## 2012-01-16 MED ORDER — ZOLPIDEM TARTRATE 5 MG PO TABS: 5.0000 mg | ORAL_TABLET | Freq: Every evening | ORAL | Status: DC | PRN

## 2012-01-16 MED ORDER — MAGNESIUM CITRATE PO SOLN: 1.0000 | Freq: Once | ORAL | Status: AC | PRN

## 2012-01-16 MED ORDER — VITAMIN D (ERGOCALCIFEROL) 1.25 MG (50000 UNIT) PO CAPS
50000.0000 [IU] | ORAL_CAPSULE | ORAL | Status: DC
  Filled 2012-01-16: qty 1

## 2012-01-16 MED ORDER — AMITRIPTYLINE HCL 50 MG PO TABS
50.0000 mg | ORAL_TABLET | Freq: Every day | ORAL | Status: DC
  Administered 2012-01-16 – 2012-01-17 (×2): 50 mg via ORAL
  Filled 2012-01-16 (×3): qty 1

## 2012-01-16 MED ORDER — BUPROPION HCL 75 MG PO TABS
75.0000 mg | ORAL_TABLET | Freq: Every day | ORAL | Status: DC
  Administered 2012-01-17 – 2012-01-18 (×2): 75 mg via ORAL
  Filled 2012-01-16 (×3): qty 1

## 2012-01-16 MED ORDER — MORPHINE SULFATE 2 MG/ML IJ SOLN: 2.0000 mg | INTRAMUSCULAR | Status: DC | PRN

## 2012-01-16 MED ORDER — PROMETHAZINE HCL 25 MG PO TABS: 12.5000 mg | ORAL_TABLET | Freq: Four times a day (QID) | ORAL | Status: DC | PRN

## 2012-01-16 MED ORDER — SENNOSIDES-DOCUSATE SODIUM 8.6-50 MG PO TABS
1.0000 | ORAL_TABLET | Freq: Every evening | ORAL | Status: DC | PRN
  Filled 2012-01-16: qty 1

## 2012-01-16 MED ORDER — PANCRELIPASE (LIP-PROT-AMYL) 12000-38000 UNITS PO CPEP
1.0000 | ORAL_CAPSULE | Freq: Three times a day (TID) | ORAL | Status: DC
  Administered 2012-01-17 – 2012-01-18 (×5): 1 via ORAL
  Filled 2012-01-16 (×6): qty 1

## 2012-01-16 MED ORDER — SENNOSIDES-DOCUSATE SODIUM 8.6-50 MG PO TABS: 1.0000 | ORAL_TABLET | Freq: Every evening | ORAL | Status: DC | PRN

## 2012-01-16 MED ORDER — ACETAMINOPHEN 325 MG PO TABS
650.0000 mg | ORAL_TABLET | Freq: Four times a day (QID) | ORAL | Status: DC | PRN
  Administered 2012-01-18: 650 mg via ORAL
  Filled 2012-01-16: qty 2

## 2012-01-16 MED ORDER — ALPRAZOLAM 1 MG PO TABS
1.0000 mg | ORAL_TABLET | Freq: Three times a day (TID) | ORAL | Status: DC
  Administered 2012-01-16 – 2012-01-18 (×5): 1 mg via ORAL
  Filled 2012-01-16 (×5): qty 1

## 2012-01-16 MED ORDER — ENOXAPARIN SODIUM 40 MG/0.4ML ~~LOC~~ SOLN
40.0000 mg | SUBCUTANEOUS | Status: DC
  Administered 2012-01-16 – 2012-01-17 (×2): 40 mg via SUBCUTANEOUS
  Filled 2012-01-16 (×3): qty 0.4

## 2012-01-16 NOTE — H&P (Signed)
[name] is an 77 y.o. female.   Chief Complaint: severe low back pain HPI:  The patient is a 76 year old woman with several medical problems who has had progressively worsening low back pain over the past 4 days. The pain is now severe and worse with any position changes. It is difficult for her to sit comfortably. She has not had recent fever or chills or trauma to her back. She also describes some vague bilateral lower abdominal discomfort that has not been associated with dysuria, frequency, fever, or chills. In January 2013 she had been admitted to the hospital with a severe pneumonia. She has prednisone requiring rheumatoid arthritis in her dose had been increased to 15 mg daily. At her followup visit in February 2013 she was doing better and it was intended that her rheumatologist could continue her tapering of prednisone to get to her usual dose of 7.5 mg daily. Unfortunately, there was some miscommunication so she remained on 15 mg daily. She is barely able to arise from a sitting position to a standing position because of her severe low back pain. She lives by herself with little family support, and because of the severity of her back pain and the associated gait instability and high risk for falls, she was admitted for further evaluation.  Medical history is significant for chronic fatigue, atypical chest pain, recurrent pancreatitis with chronic diarrhea from pancreatic insufficiency, chronic anxiety and depression, chronic mild low back pain, diffuse arthralgias from her return arthritis, osteoporosis, vitamin D deficiency, and mild anemia.  Past Medical History  Diagnosis Date  . Osteoporosis   . Rheumatoid arthritis   . Murmur, cardiac   . Diabetes mellitus   . Pneumonia   . Anxiety   . Adrenal insufficiency, primary, iatrogenic   . Gait instability   . Physical deconditioning     Medications Prior to Admission  Medication Dose Route Frequency Provider Last Rate Last Dose    . 0.9 % NaCl with KCl 20 mEq/ L  infusion   Intravenous Continuous [name], MD 75 mL/hr at 01/16/12 1935    . acetaminophen (TYLENOL) tablet 650 mg  650 mg Oral Q6H PRN [name], MD       Or  . acetaminophen (TYLENOL) suppository 650 mg  650 mg Rectal Q6H PRN [name], MD      . ALPRAZolam Prudy Feeler) tablet 1 mg  1 mg Oral TID [name], MD      . alum & mag hydroxide-simeth (MAALOX/MYLANTA) 200-200-20 MG/5ML suspension 30 mL  30 mL Oral Q6H PRN [name], MD      . amitriptyline (ELAVIL) tablet 50 mg  50 mg Oral QHS [name], MD      . buPROPion [facility]) tablet 75 mg  75 mg Oral Daily [name], MD      . enoxaparin (LOVENOX) injection 40 mg  40 mg Subcutaneous Q24H [name], MD   40 mg at 01/16/12 2000  . HYDROcodone-acetaminophen (NORCO) 5-325 MG per tablet 1-2 tablet  1-2 tablet Oral Q4H PRN [name], MD   2 tablet at 01/16/12 1916  . lipase/protease/amylase (CREON-10/PANCREASE) capsule 1 capsule  1 capsule Oral TID AC [name], MD      . magnesium citrate solution 1 Bottle  1 Bottle Oral Once PRN [name], MD      . mirtazapine (REMERON) tablet 30 mg  30 mg Oral QHS [name], MD      .  morphine 2 MG/ML injection 2 mg  2 mg Intravenous Q4H PRN [name], MD      . polyvinyl alcohol (LIQUIFILM TEARS) 1.4 % ophthalmic solution 1 drop  1 drop Both Eyes TID [name], MD   1 drop at 01/16/12 2202  . predniSONE (DELTASONE) tablet 20 mg  20 mg Oral QAC breakfast [name], MD      . promethazine (PHENERGAN) tablet 12.5 mg  12.5 mg Oral Q6H PRN [name], MD      . senna-docusate (Senokot-S) tablet 1 tablet  1 tablet Oral QHS PRN [name], MD      . sulfaSALAzine (AZULFIDINE) tablet 500 mg  500 mg Oral BID [name], MD      . Vitamin D (Ergocalciferol) (DRISDOL) capsule 50,000 Units  50,000 Units Oral Q7 days [name], MD      .  zolpidem [facility]) tablet 5 mg  5 mg Oral QHS PRN [name], MD      . DISCONTD: hydroxypropyl methylcellulose (ISOPTO TEARS) 2.5 % ophthalmic solution 1 drop  1 drop Both Eyes TID [name], MD      . DISCONTD: senna-docusate (Senokot-S) tablet 1 tablet  1 tablet Oral QHS PRN [name], MD       Medications Prior to Admission  Medication Sig Dispense Refill  . alendronate (FOSAMAX) 70 MG tablet Take 70 mg by mouth every 7 (seven) days. Take with a full glass of water on an empty stomach.[name] on sundays per patient.       . ALPRAZolam (XANAX) 1 MG tablet Take 1 mg by mouth 3 (three) times daily. Patient states she takes 3 times a day every day per patient.       Marland Kitchen amitriptyline (ELAVIL) 25 MG tablet Take 50 mg by mouth at bedtime.       Marland Kitchen buPROPion (WELLBUTRIN) 75 MG tablet Take 75 mg by mouth every morning.       Marland Kitchen HYDROcodone-acetaminophen (VICODIN) 5-500 MG per tablet Take 1 tablet by mouth 3 (three) times daily as needed. For pain       . hydroxypropyl methylcellulose (ISOPTO TEARS) 2.5 % ophthalmic solution Place 1 drop into both eyes as needed. For dry eyes       . lipase/protease/amylase (CREON-10/PANCREASE) 12000 UNITS CPEP Take 1 capsule by mouth 3 (three) times daily before meals.  270 capsule  1  . mirtazapine (REMERON) 30 MG tablet Take 30 mg by mouth at bedtime.        . predniSONE (DELTASONE) 20 MG tablet Take 20 mg by mouth daily before breakfast.      . senna-docusate (SENOKOT-S) 8.6-50 MG per tablet Take 1 tablet by mouth at bedtime as needed.      . sulfaSALAzine (AZULFIDINE) 500 MG tablet Take 500 mg by mouth 2 (two) times daily.        . Vitamin D, Ergocalciferol, (DRISDOL) 50000 UNITS CAPS Take 50,000 Units by mouth every 7 (seven) days. Takes every Sunday per patient.         ADDITIONAL HOME MEDICATIONS: Vitamin D 50,000 units by mouth once monthly, Wellbutrin 75 mg by mouth daily, Vicodin 5/500 take one tab by mouth 3 times a day when necessary  moderate pain, Remeron 30 mg by mouth each bedtime, loss and neck 70 mg by mouth once weekly, amitriptyline 25 mg take 3 tabs by mouth each bedtime, Xanax 1 mg by mouth 3 times a  day when necessary anxiety, prednisone 5 mg take 3 tabs by mouth daily  PHYSICIANS INVOLVED IN CARE: Jarome Matin (primary care), Al Little (cardiology), Carman Ching (GI), Gaynelle Arabian (urology), Ranee Gosselin (ortho), Dr. Dierdre Forth (rheum)  List of Surgeries:  1960s cholecystectomy, 1970 left knee surgery, 1996 bunionectomy, 1996 left knee surgery, 1999 left rotator cuff surgery, remote total abdominal hysterectomy and bilateral salpingo-oophorectomy, 2009 left hip fracture with open reduction and internal fixation, July 2012 colonoscopy  Past Surgical History  Procedure Date  . Knee arthroscopy     2 on left  . Cholecystectomy   . Thyroidectomy   . Abdominal hysterectomy     History reviewed. No pertinent family history. her father died in his 103s from colon cancer and diabetes mellitus. Her mother died age 52 years. She has 3 sisters, 2 of whom have diabetes mellitus, and she has 2 brothers. She has one son 2 daughters. There is a positive family history for diabetes mellitus and colon cancer.   Social History:  reports that she has never smoked. She has never used smokeless tobacco. She reports that she does not drink alcohol or use illicit drugs. she has been a widow since 1999 when her husband was killed in a hunting accident. She has 3 children. She is retired and lives by herself.  Allergies:  Allergies  Allergen Reactions  . Penicillins Hives   Pertinent immunizations: Tetanus booster in 2003, Pneumovax in June 2006, flu vaccination on 09/23/2011  ROS: arthritis, diabetes, heart murmur and Chronic fatigue, chronic loose bowel movements from malabsorption, chronic joint pain from rheumatoid arthritis, mild gait instability without recent falls  PHYSICAL EXAM: Blood pressure 109/64, pulse 100,  temperature 98.3 F (36.8 C), temperature source Oral, resp. rate 18, height 5\' 3"  (1.6 m), weight 68.04 kg (150 lb), SpO2 93.00%. In general, the patient is a frail elderly white woman who was in no apparent distress while sitting upright in a chair. She did flinch with pain when changing from a sitting position to standing position. HEENT exam was within normal limits, neck was supple without jugular venous distention or carotid bruit, chest was clear to auscultation, heart had a regular rate and rhythm with a grade 2/6 systolic ejection murmur, abdomen had normal bowel sounds no hepatosplenomegaly or tenderness, extremities were without cyanosis, clubbing, or edema, and the pedal pulses were normal. Neurologic exam: She was alert and well oriented with normal affect, cranial nerves II through XII are normal, she had bilateral 4/5 quadriceps strength, she could only walk very slowly with short steps with a wide-base gait using a single prong cane and she had moderate instability with direction changes.  Results for orders placed during the hospital encounter of 01/16/12 (from the past 48 hour(s))  CBC     Status: Normal   Collection Time   01/16/12  8:19 PM      Component Value Range Comment   WBC 9.3  4.0 - 10.5 (K/uL)    RBC 3.91  3.87 - 5.11 (MIL/uL)    Hemoglobin 12.5  12.0 - 15.0 (g/dL)    HCT 16.1  09.6 - 04.5 (%)    MCV 97.4  78.0 - 100.0 (fL)    MCH 32.0  26.0 - 34.0 (pg)    MCHC 32.8  30.0 - 36.0 (g/dL)    RDW 40.9  81.1 - 91.4 (%)    Platelets 223  150 - 400 (K/uL)   COMPREHENSIVE METABOLIC PANEL     Status: Abnormal  Collection Time   01/16/12  8:19 PM      Component Value Range Comment   Sodium 139  135 - 145 (mEq/L)    Potassium 3.5  3.5 - 5.1 (mEq/L)    Chloride 100  96 - 112 (mEq/L)    CO2 31  19 - 32 (mEq/L)    Glucose, Bld 97  70 - 99 (mg/dL)    BUN 10  6 - 23 (mg/dL)    Creatinine, Ser 1.61  0.50 - 1.10 (mg/dL)    Calcium 9.6  8.4 - 10.5 (mg/dL)    Total Protein 6.5   6.0 - 8.3 (g/dL)    Albumin 3.8  3.5 - 5.2 (g/dL)    AST 15  0 - 37 (U/L)    ALT 9  0 - 35 (U/L)    Alkaline Phosphatase 63  39 - 117 (U/L)    Total Bilirubin 0.5  0.3 - 1.2 (mg/dL)    GFR calc non Af Amer 87 (*) >90 (mL/min)    GFR calc Af Amer >90  >90 (mL/min)    No results found.   Assessment/Plan #1 acute low back pain: Her pain is quite severe and is of uncertain cause. Given her chronic use of prednisone I am very concerned that she may have suffered a compression fracture. Since she lives alone and does not have much family support we will evaluate the source of her back pain, and tried to improve her back pain, as an inpatient. We will also have a physical therapist evaluate the safety of her gait. #2 iatrogenic adrenal insufficiency: This clinically stable, however we will begin the process of her slow taper down to her baseline prednisone dose of 7.5 mg daily. #3 abdominal pain: Her pain is vague and could be radiation from a back source. We will check a urinalysis and urine culture. She has had frequent urinary tract infections.   Miraj Truss G 01/16/2012, 10:52 PM

## 2012-01-17 ENCOUNTER — Inpatient Hospital Stay (HOSPITAL_COMMUNITY): Payer: Medicare Other

## 2012-01-17 MED ORDER — FENTANYL 12 MCG/HR TD PT72: 12.5000 ug | MEDICATED_PATCH | TRANSDERMAL | Status: DC

## 2012-01-17 NOTE — Progress Notes (Signed)
Spoke with Pt re: d/c plans.  Pt stated that, while she lives alone, her daughter is within 5 minutes of her and that both her daughter and her daughter's boyfriend, Nida Boatman, are available to her at all times.  Pt stated that she had a bad experience at [facility] while receiving rehab there.  She stated that she is now weary of SNFs and is unsure about going.  Pt states that she's independent and doesn't enjoy entertaining the thought that she may have to spend 20 or more days at a SNF.  CSW validated Pt's feelings and explored with Pt all the options available to her.  Pt gave CSW permission to fax her information to [facility] with the understanding that she hasn't officially decided on SNF as her d/c plan.  She states that she may want to d/c home with [facility].  CSW thanked Pt for her time.  FL2 completed and placed in Shadow chart for MD signature.  CSW faxed Pt's info to [facility].  Providence Crosby, LCSWA Clinical Social Work [phone]

## 2012-01-17 NOTE — Progress Notes (Signed)
Subjective: Patient is in bed back from MRI seemingly comfortable when still. At the time of my seeing her we do not have the MRI back but I did relate to her we suspected a compression fracture. I did relate that depending on the degree of compression this may be terminal medical management versus a consult for vertebroplasty regardless, she does live alone and we had a discussion about disposition and knowledge eventually some sort of disposition as she cannot manage herself at this point.  Objective: Vital signs in last 24 hours: Temp:  [97.4 F (36.3 C)-98.3 F (36.8 C)] 97.4 F (36.3 C) (03/29 0507) Pulse Rate:  [100-107] 107  (03/29 0507) Resp:  [14-18] 14  (03/29 0507) BP: (108-109)/(64-68) 108/68 mmHg (03/29 0507) SpO2:  [93 %] 93 % (03/29 0507) Weight:  [68.04 kg (150 lb)] 68.04 kg (150 lb) (03/28 1759) Weight change:   CBG (last 3)  No results found for this basename: GLUCAP:3 in the last 72 hours  Intake/Output from previous day: 03/28 0701 - 03/29 0700 In: 850 [I.V.:850] Out: -   Physical Exam: Patient is awake alert no distress. Good facial symmetry. No JVD or bruits. Lungs are clear to auscultation percussion. Cardiovascular exam regular rate and rhythm grade 2/6 systolic ejection murmur. Abdomen is soft and nontender benign. Neurologic exam is normal.   Lab Results:  [facility] 01/16/12 2019  NA 139  K 3.5  CL 100  CO2 31  GLUCOSE 97  BUN 10  CREATININE 0.59  CALCIUM 9.6  MG --  PHOS --    Basename 01/16/12 2019  AST 15  ALT 9  ALKPHOS 63  BILITOT 0.5  PROT 6.5  ALBUMIN 3.8    Basename 01/16/12 2019  WBC 9.3  NEUTROABS --  HGB 12.5  HCT 38.1  MCV 97.4  PLT 223   Lab Results  Component Value Date   INR 0.87 09/16/2011   INR 2.6* 05/12/2008   INR 2.3* 05/11/2008   No results found for this basename: CKTOTAL:3,CKMB:3,CKMBINDEX:3,TROPONINI:3 in the last 72 hours No results found for this basename: TSH,T4TOTAL,FREET3,T3FREE,THYROIDAB in the  last 72 hours No results found for this basename: VITAMINB12:2,FOLATE:2,FERRITIN:2,TIBC:2,IRON:2,RETICCTPCT:2 in the last 72 hours  Studies/Results: Dg Thoracic Spine 2 View  01/17/2012  *RADIOLOGY REPORT*  Clinical Data: Acute onset of mid back pain.  THORACIC SPINE - 2 VIEW  Comparison: CTA of the chest performed 11/12/2011  Findings: There is no evidence of acute fracture or subluxation. There is a relatively stable appearance to the chronic compression deformity at vertebral body T6.  Vertebral bodies otherwise demonstrate normal height and alignment.  Intervertebral disc spaces are preserved.  The visualized portions of both lungs are clear.  The mediastinum is unremarkable in appearance.  Clips are noted within the right upper quadrant, reflecting prior cholecystectomy.  IMPRESSION:  1.  No evidence of acute fracture or subluxation along the thoracic spine. 2.  Relatively stable appearance to the chronic compression deformity at vertebral body T6.  Original Report Authenticated By: Tonia Ghent, M.D.   Dg Lumbar Spine 2-3 Views  01/17/2012  *RADIOLOGY REPORT*  Clinical Data: New onset of severe lower back pain.  LUMBAR SPINE - 2-3 VIEW  Comparison: CT of the chest, abdomen and pelvis performed 09/16/2011  Findings: There is slight new loss of height at the superior endplate of L4; mild associated degenerative change suggests against acute injury, though it cannot be entirely excluded.  There is no evidence of retropulsion.  There is chronic narrowing of the  intervertebral disc space at L2- L3, with associated endplate sclerosis and irregularity.  Scattered anterior osteophytes are noted along the lumbar spine.  Vertebral bodies otherwise demonstrate normal height and alignment.  The visualized bowel gas pattern is unremarkable in appearance; air and stool are noted within the colon.  The sacroiliac joints are within normal limits.  A left hip hemiarthroplasty is grossly unremarkable in appearance.   Clips are noted within the right upper quadrant, reflecting prior cholecystectomy.  IMPRESSION:  1.  Slight new loss of height at the superior endplate of L4; mild associated degenerative change suggests against acute injury, though it cannot be entirely excluded.  No evidence of retropulsion. 2.  Mild multilevel degenerative change noted along the lumbar spine.  Original Report Authenticated By: Tonia Ghent, M.D.   Mr Lumbar Spine Wo Contrast  01/17/2012  *RADIOLOGY REPORT*  Clinical Data: Severe lower back pain.  Evaluate for possible compression fracture.  MRI LUMBAR SPINE WITHOUT CONTRAST  Technique:  Multiplanar and multiecho pulse sequences of the lumbar spine were obtained without intravenous contrast.  Comparison: 01/16/2012 plain film exam.  04/09/2008 MR.  Findings: Last fully open disc space is labeled L5-S1.  Present examination incorporates from T11-12 disc space level to the lower sacrum.  Conus low-lying at the L2-3 level.  Atherosclerotic type changes of the aorta.  Tiny renal cysts.  Scoliosis lumbar spine.  L4 superior endplate compression fracture new from the prior MR scan.  5% loss of height. Per CMS PQRI reporting requirements (PQRI Measure 24): Given the patient's age of greater than 50 and the fracture site (hip, distal radius, or spine), the patient should be tested for osteoporosis using DXA, and the appropriate treatment considered based on the DXA results.  T11-12:  Negative.  T12-L1:  Negative.  L1-2:  Rotation.  Disc degeneration.  Bulge.  Facet joint degenerative changes.  Very mild spinal stenosis slightly greater on the left.  L2-3:  Prominent disc degeneration with disc space narrowing greater on the left.  Fluid signal intensity of the disc without other findings to suggest infection.   Broad-based disc osteophyte greater to the left without nerve root compression.  Facet joint degenerative changes and rotation.  Baseline mild spinal stenosis and lateral recess stenosis  greater on the left.  Superimposed small left paracentral disc protrusion with minimal cephalad and caudal extension.  L3-4:  Mild bulge.  Left lateral osteophyte.  No nerve root compression.  Mild facet joint degenerative changes ligamentum flavum hypertrophy.  L4-5:  Facet joint degenerative changes.  Mild bulge.  No significant spinal stenosis or foraminal narrowing.  L5-S1:  Facet joint degenerative changes.  Broad-based disc osteophyte with greatest extension right lateral position with mild encroachment upon the exiting right L5 nerve root.  Minimal right lateral recess stenosis.  IMPRESSION: Most notable new finding is the L4 superior endplate mild compression fracture with 5% loss of height.  Please see above.  Original Report Authenticated By: Fuller Canada, M.D.     Assessment/Plan: #1 lumbar endplate compression fracture 5% loss clearly not suitable for vertebroplasty will need conservative management with analgesia, nutrition, and probable disposition issues will consult social services- L4 #2 iatrogenic adrenal insufficiency clinically stable  #3 chronic depression stable  Plan consult social services and adjust analgesia  LOS: 1 day   Lateefah Mallery A 01/17/2012, 10:24 AM

## 2012-01-17 NOTE — Progress Notes (Signed)
Physical Therapy Treatment Patient Details Name: [name] MRN: [id number] DOB: February 09, 1935 Today's Date: 01/17/2012  PT Assessment/Plan  PT - Assessment/Plan PT Plan: Discharge plan remains appropriate PT Frequency: Min 5X/week Recommendations for Other Services: OT consult Follow Up Recommendations: Home health PT;Skilled nursing facility Equipment Recommended: None recommended by PT PT Goals  Acute Rehab PT Goals PT Goal Formulation: With patient Time For Goal Achievement: 7 days Pt will Roll Supine to Right Side: with supervision PT Goal: Rolling Supine to Right Side - Progress: Goal set today Pt will Roll Supine to Left Side: with supervision PT Goal: Rolling Supine to Left Side - Progress: Goal set today Pt will go Supine/Side to Sit: with supervision PT Goal: Supine/Side to Sit - Progress: Goal set today Pt will go Sit to Supine/Side: with supervision PT Goal: Sit to Supine/Side - Progress: Goal set today Pt will go Sit to Stand: with supervision PT Goal: Sit to Stand - Progress: Progressing toward goal Pt will go Stand to Sit: with supervision PT Goal: Stand to Sit - Progress: Progressing toward goal Pt will Ambulate: 51 - 150 feet;with supervision;with rolling walker PT Goal: Ambulate - Progress: Progressing toward goal Pt will Go Up / Down Stairs: 1-2 stairs;with min assist;with least restrictive assistive device PT Goal: Up/Down Stairs - Progress: Goal set today  PT Treatment Precautions/Restrictions  Precautions Precautions: Fall Restrictions Weight Bearing Restrictions: No Mobility (including Balance) Bed Mobility Bed Mobility: Yes Supine to Sit: 3: Mod assist Supine to Sit Details (indicate cue type and reason): cues for Log roll and mod assist to attain sitting from flat bed Transfers Transfers: Yes Sit to Stand: 4: Min assist;5: Supervision;With armrests;From chair/3-in-1 Sit to Stand Details (indicate cue type and reason): cues for use of UEs Stand  to Sit: 4: Min assist;With armrests Stand to Sit Details: cues for use of UEs Ambulation/Gait Ambulation/Gait: Yes Ambulation/Gait Assistance: 4: Min assist;5: Supervision Ambulation/Gait Assistance Details (indicate cue type and reason): min cues for position from RW Ambulation Distance (Feet): 300 Feet Assistive device: Rolling walker Gait Pattern: Step-through pattern    Exercise    End of Session PT - End of Session Activity Tolerance: Patient tolerated treatment well Patient left: in chair;with call bell in reach Nurse Communication: Mobility status for transfers;Mobility status for ambulation General Behavior During Session: [facility] for tasks performed Cognition: [facility] for tasks performed  [name] 01/17/2012, 3:39 PM

## 2012-01-17 NOTE — Evaluation (Signed)
Physical Therapy Evaluation Patient Details Name: [name] MRN: [id number] DOB: 10-25-1934 Today's Date: 01/17/2012  Problem List:  Patient Active Problem List  Diagnoses  . UTI (lower urinary tract infection)  . Fever  . Cough  . Failure to thrive in adult  . Weakness generalized  . Rheumatoid arthritis  . Type II or unspecified type diabetes mellitus without mention of complication, not stated as uncontrolled  . Pneumonia  . Adrenal insufficiency, primary, iatrogenic  . Sinusitis, acute, sphenoidal  . Low back pain  . Gait instability    Past Medical History:  Past Medical History  Diagnosis Date  . Osteoporosis   . Rheumatoid arthritis   . Murmur, cardiac   . Diabetes mellitus   . Pneumonia   . Anxiety   . Adrenal insufficiency, primary, iatrogenic   . Gait instability   . Physical deconditioning    Past Surgical History:  Past Surgical History  Procedure Date  . Knee arthroscopy     2 on left  . Cholecystectomy   . Thyroidectomy   . Abdominal hysterectomy     PT Assessment/Plan/Recommendation PT Assessment Clinical Impression Statement: Pt presents with back pain limiting basic functional mobility esp in/out of bed and sit to/from stand. PT Recommendation/Assessment: Patient will need skilled PT in the acute care venue PT Problem List: Decreased mobility;Pain Barriers to Discharge: Decreased caregiver support Barriers to Discharge Comments: Pt states her dtr lives ~ 5 miles away and helps with getting groceries etc. PT Therapy Diagnosis : Acute pain PT Plan PT Frequency: Min 5X/week PT Treatment/Interventions: DME instruction;Gait training;Stair training;Functional mobility training;Patient/family education PT Recommendation Recommendations for Other Services: OT consult Follow Up Recommendations: Home health PT;Skilled nursing facility (dependent on acute stay progress and level of assist at home) Equipment Recommended: None recommended by PT PT  Goals  Acute Rehab PT Goals PT Goal Formulation: With patient Time For Goal Achievement: 7 days Pt will Roll Supine to Right Side: with supervision PT Goal: Rolling Supine to Right Side - Progress: Goal set today Pt will Roll Supine to Left Side: with supervision PT Goal: Rolling Supine to Left Side - Progress: Goal set today Pt will go Supine/Side to Sit: with supervision PT Goal: Supine/Side to Sit - Progress: Goal set today Pt will go Sit to Supine/Side: with supervision PT Goal: Sit to Supine/Side - Progress: Goal set today Pt will go Sit to Stand: with supervision PT Goal: Sit to Stand - Progress: Goal set today Pt will go Stand to Sit: with supervision PT Goal: Stand to Sit - Progress: Goal set today Pt will Ambulate: 51 - 150 feet;with supervision;with rolling walker PT Goal: Ambulate - Progress: Goal set today Pt will Go Up / Down Stairs: 1-2 stairs;with min assist;with least restrictive assistive device PT Goal: Up/Down Stairs - Progress: Goal set today  PT Evaluation Precautions/Restrictions  Precautions Precautions: Fall Restrictions Weight Bearing Restrictions: No Prior Functioning  Home Living Lives With: Alone Receives Help From: Family Type of Home: Apartment Home Layout: One level Home Access: Stairs to enter Entrance Stairs-Rails: Left Entrance Stairs-Number of Steps: 2 Home Adaptive Equipment: Walker - rolling Prior Function Level of Independence: Requires assistive device for independence;Independent with gait;Independent with basic ADLs Able to Take Stairs?: Yes Cognition Cognition Arousal/Alertness: Awake/alert Overall Cognitive Status: Appears within functional limits for tasks assessed Orientation Level: Oriented X4 Sensation/Coordination Coordination Gross Motor Movements are Fluid and Coordinated: Yes Extremity Assessment RUE Assessment RUE Assessment: Within Functional Limits LUE Assessment LUE Assessment: Within Functional  Limits RLE  Assessment RLE Assessment: Within Functional Limits LLE Assessment LLE Assessment: Within Functional Limits Mobility (including Balance) Bed Mobility Bed Mobility: Yes Supine to Sit: 3: Mod assist Supine to Sit Details (indicate cue type and reason): cues for Log roll and mod assist to attain sitting from flat bed Transfers Transfers: Yes Sit to Stand: 4: Min assist;3: Mod assist Sit to Stand Details (indicate cue type and reason): cues for use of UEs to assist Stand to Sit: 4: Min assist Stand to Sit Details: cues for use of UEs to assist Ambulation/Gait Ambulation/Gait: Yes Ambulation/Gait Assistance: 4: Min assist Ambulation/Gait Assistance Details (indicate cue type and reason): Pt initially demonstrating mild general instability with RW but progressing to ambulation with CGA. Ambulation Distance (Feet): 400 Feet Assistive device: Rolling walker Gait Pattern: Step-through pattern    Exercise    End of Session PT - End of Session Activity Tolerance: Patient limited by pain;Patient tolerated treatment well Patient left: in chair;with call bell in reach Nurse Communication: Mobility status for transfers;Mobility status for ambulation General Behavior During Session: [facility] for tasks performed Cognition: [facility] for tasks performed  Jaxin Fulfer 01/17/2012, 1:25 PM

## 2012-01-18 LAB — URINE CULTURE
Culture  Setup Time: 201303290849
Special Requests: NORMAL

## 2012-01-18 MED ORDER — HYDROCODONE-ACETAMINOPHEN 5-325 MG PO TABS: 1.0000 | ORAL_TABLET | ORAL | Status: AC | PRN

## 2012-01-18 MED ORDER — ACETAMINOPHEN 325 MG PO TABS: 650.0000 mg | ORAL_TABLET | Freq: Four times a day (QID) | ORAL | Status: DC | PRN

## 2012-01-18 NOTE — Discharge Summary (Signed)
DISCHARGE SUMMARY  [name]  MR#: [id number]  DOB:1935-03-31  Date of Admission: 01/16/2012 Date of Discharge: 01/18/2012  Attending Physician:Lyza Houseworth A  Patient's KZS:[id number],TDDUKG G, MD, MD  Consults:  none  Discharge Diagnoses: Principal Problem:  *Low back pain due to L4-5 percent endplate compression Active Problems:  Failure to thrive in adult  Weakness generalized  Rheumatoid arthritis  Adrenal insufficiency, primary, iatrogenic  Gait instability   Discharge Medications: Medication List  As of 01/18/2012 11:10 AM   STOP taking these medications         HYDROcodone-acetaminophen 5-500 MG per tablet         TAKE these medications         acetaminophen 325 MG tablet   Commonly known as: TYLENOL   Take 2 tablets (650 mg total) by mouth every 6 (six) hours as needed (or Fever >/= 101).      alendronate 70 MG tablet   Commonly known as: FOSAMAX   Take 70 mg by mouth every 7 (seven) days. Take with a full glass of water on an empty stomach.[name] on sundays per patient.      ALPRAZolam 1 MG tablet   Commonly known as: XANAX   Take 1 mg by mouth 3 (three) times daily. Patient states she takes 3 times a day every day per patient.      amitriptyline 25 MG tablet   Commonly known as: ELAVIL   Take 50 mg by mouth at bedtime.      buPROPion 75 MG tablet   Commonly known as: WELLBUTRIN   Take 75 mg by mouth every morning.      HYDROcodone-acetaminophen 5-325 MG per tablet   Commonly known as: NORCO   Take 1-2 tablets by mouth every 4 (four) hours as needed.      hydroxypropyl methylcellulose 2.5 % ophthalmic solution   Commonly known as: ISOPTO TEARS   Place 1 drop into both eyes as needed. For dry eyes      lipase/protease/amylase 25427 UNITS Cpep   Commonly known as: CREON-10/PANCREASE   Take 1 capsule by mouth 3 (three) times daily before meals.      mirtazapine 30 MG tablet   Commonly known as: REMERON   Take 30 mg by mouth at bedtime.        predniSONE 20 MG tablet   Commonly known as: DELTASONE   Take 20 mg by mouth daily before breakfast.      senna-docusate 8.6-50 MG per tablet   Commonly known as: Senokot-S   Take 1 tablet by mouth at bedtime as needed.      sulfaSALAzine 500 MG tablet   Commonly known as: AZULFIDINE   Take 500 mg by mouth 2 (two) times daily.      Vitamin D (Ergocalciferol) 50000 UNITS Caps   Commonly known as: DRISDOL   Take 50,000 Units by mouth every 7 (seven) days. Takes every Sunday per patient.            Hospital Procedures: Dg Thoracic Spine 2 View  01/17/2012  *RADIOLOGY REPORT*  Clinical Data: Acute onset of mid back pain.  THORACIC SPINE - 2 VIEW  Comparison: CTA of the chest performed 11/12/2011  Findings: There is no evidence of acute fracture or subluxation. There is a relatively stable appearance to the chronic compression deformity at vertebral body T6.  Vertebral bodies otherwise demonstrate normal height and alignment.  Intervertebral disc spaces are preserved.  The visualized portions of both lungs are clear.  The mediastinum is unremarkable in appearance.  Clips are noted within the right upper quadrant, reflecting prior cholecystectomy.  IMPRESSION:  1.  No evidence of acute fracture or subluxation along the thoracic spine. 2.  Relatively stable appearance to the chronic compression deformity at vertebral body T6.  Original Report Authenticated By: Tonia Ghent, M.D.   Dg Lumbar Spine 2-3 Views  01/17/2012  *RADIOLOGY REPORT*  Clinical Data: New onset of severe lower back pain.  LUMBAR SPINE - 2-3 VIEW  Comparison: CT of the chest, abdomen and pelvis performed 09/16/2011  Findings: There is slight new loss of height at the superior endplate of L4; mild associated degenerative change suggests against acute injury, though it cannot be entirely excluded.  There is no evidence of retropulsion.  There is chronic narrowing of the intervertebral disc space at L2- L3, with associated  endplate sclerosis and irregularity.  Scattered anterior osteophytes are noted along the lumbar spine.  Vertebral bodies otherwise demonstrate normal height and alignment.  The visualized bowel gas pattern is unremarkable in appearance; air and stool are noted within the colon.  The sacroiliac joints are within normal limits.  A left hip hemiarthroplasty is grossly unremarkable in appearance.  Clips are noted within the right upper quadrant, reflecting prior cholecystectomy.  IMPRESSION:  1.  Slight new loss of height at the superior endplate of L4; mild associated degenerative change suggests against acute injury, though it cannot be entirely excluded.  No evidence of retropulsion. 2.  Mild multilevel degenerative change noted along the lumbar spine.  Original Report Authenticated By: Tonia Ghent, M.D.   Mr Lumbar Spine Wo Contrast  01/17/2012  *RADIOLOGY REPORT*  Clinical Data: Severe lower back pain.  Evaluate for possible compression fracture.  MRI LUMBAR SPINE WITHOUT CONTRAST  Technique:  Multiplanar and multiecho pulse sequences of the lumbar spine were obtained without intravenous contrast.  Comparison: 01/16/2012 plain film exam.  04/09/2008 MR.  Findings: Last fully open disc space is labeled L5-S1.  Present examination incorporates from T11-12 disc space level to the lower sacrum.  Conus low-lying at the L2-3 level.  Atherosclerotic type changes of the aorta.  Tiny renal cysts.  Scoliosis lumbar spine.  L4 superior endplate compression fracture new from the prior MR scan.  5% loss of height. Per CMS PQRI reporting requirements (PQRI Measure 24): Given the patient's age of greater than 50 and the fracture site (hip, distal radius, or spine), the patient should be tested for osteoporosis using DXA, and the appropriate treatment considered based on the DXA results.  T11-12:  Negative.  T12-L1:  Negative.  L1-2:  Rotation.  Disc degeneration.  Bulge.  Facet joint degenerative changes.  Very mild spinal  stenosis slightly greater on the left.  L2-3:  Prominent disc degeneration with disc space narrowing greater on the left.  Fluid signal intensity of the disc without other findings to suggest infection.   Broad-based disc osteophyte greater to the left without nerve root compression.  Facet joint degenerative changes and rotation.  Baseline mild spinal stenosis and lateral recess stenosis greater on the left.  Superimposed small left paracentral disc protrusion with minimal cephalad and caudal extension.  L3-4:  Mild bulge.  Left lateral osteophyte.  No nerve root compression.  Mild facet joint degenerative changes ligamentum flavum hypertrophy.  L4-5:  Facet joint degenerative changes.  Mild bulge.  No significant spinal stenosis or foraminal narrowing.  L5-S1:  Facet joint degenerative changes.  Broad-based disc osteophyte with greatest extension right lateral position with  mild encroachment upon the exiting right L5 nerve root.  Minimal right lateral recess stenosis.  IMPRESSION: Most notable new finding is the L4 superior endplate mild compression fracture with 5% loss of height.  Please see above.  Original Report Authenticated By: Fuller Canada, M.D.    History of Present Illness:  The patient is a 77 year old woman with several medical problems who has had progressively worsening low back pain over the past 4 days. The pain is now severe and worse with any position changes. It is difficult for her to sit comfortably. She has not had recent fever or chills or trauma to her back. She also describes some vague bilateral lower abdominal discomfort that has not been associated with dysuria, frequency, fever, or chills. In January 2013 she had been admitted to the hospital with a severe pneumonia. She has prednisone requiring rheumatoid arthritis in her dose had been increased to 15 mg daily. At her followup visit in February 2013 she was doing better and it was intended that her rheumatologist could  continue her tapering of prednisone to get to her usual dose of 7.5 mg daily. Unfortunately, there was some miscommunication so she remained on 15 mg daily. She is barely able to arise from a sitting position to a standing position because of her severe low back pain. She lives by herself with little family support, and because of the severity of her back pain and the associated gait instability and high risk for falls, she was admitted for further evaluation.  Medical history is significant for chronic fatigue, atypical chest pain, recurrent pancreatitis with chronic diarrhea from pancreatic insufficiency, chronic anxiety and depression, chronic mild low back pain, diffuse arthralgias from her return arthritis, osteoporosis, vitamin D deficiency, and mild anemia.  Hospital Course: Patient was admitted to the hospital with increasing and intractable pain. He was initially placed at bedrest and provided with some hydration and IV analgesia. MRI of the spine confirmed a 5% new acute L4 endplate compression. She was converted to oral meds and was tolerating a diet quite well. OT and PT were consulted and she was ambulating reasonably well with a walker. I did off her her skilled nursing and she has declined and feels like she can make it at home despite my encouragement to transition to skilled nursing. She does have a daughter who lives within 5 miles in other family members very close by that we'll be assisting her in day-to-day activities as well as shopping needs. She is discharged home with well-controlled pain, eating well and able to transfer and toilet or self.  Day of Discharge Exam BP 137/84  Pulse 102  Temp(Src) 97.8 F (36.6 C) (Oral)  Resp 18  Ht 5\' 3"  (1.6 m)  Wt 68.04 kg (150 lb)  BMI 26.57 kg/m2  SpO2 93%  Physical Exam: General appearance: alert, cooperative and no distress Eyes: no scleral icterus Throat: oropharynx moist without erythema Resp: clear to auscultation  bilaterally Cardio: regular rate and rhythm and S1, S2 normal Extremities: no clubbing, cyanosis or edema  Discharge Labs:  [facility] 01/16/12 2019  NA 139  K 3.5  CL 100  CO2 31  GLUCOSE 97  BUN 10  CREATININE 0.59  CALCIUM 9.6  MG --  PHOS --    Basename 01/16/12 2019  AST 15  ALT 9  ALKPHOS 63  BILITOT 0.5  PROT 6.5  ALBUMIN 3.8    Basename 01/16/12 2019  WBC 9.3  NEUTROABS --  HGB 12.5  HCT 38.1  MCV 97.4  PLT 223   No results found for this basename: CKTOTAL:3,CKMB:3,CKMBINDEX:3,TROPONINI:3 in the last 72 hours No results found for this basename: TSH,T4TOTAL,FREET3,T3FREE,THYROIDAB in the last 72 hours No results found for this basename: VITAMINB12:2,FOLATE:2,FERRITIN:2,TIBC:2,IRON:2,RETICCTPCT:2 in the last 72 hours  Discharge instructions: Discharge Orders    Future Orders Please Complete By Expires   Diet - low sodium heart healthy      Increase activity slowly         Disposition: Home  Follow-up Appts: Follow-up with Dr. Ivery Quale at [facility] at [phone].  Call for appointment.  Condition on Discharge: Improved and stable  Tests Needing Follow-up: None  Signed: Joycie Aerts A 01/18/2012, 11:10 AM

## 2012-01-18 NOTE — Progress Notes (Signed)
Physical Therapy Treatment Patient Details Name: [name] MRN: [id number] DOB: 04-Sep-1935 Today's Date: 01/18/2012  PT Assessment/Plan  PT - Assessment/Plan Comments on Treatment Session: Pt demonstrating increased ease of movement most notably on transfers to/from bed PT Plan: Discharge plan needs to be updated PT Frequency: Min 5X/week Recommendations for Other Services: OT consult Follow Up Recommendations: Home health PT Equipment Recommended: None recommended by PT PT Goals  Acute Rehab PT Goals PT Goal Formulation: With patient Time For Goal Achievement: 7 days Pt will Roll Supine to Right Side: with supervision PT Goal: Rolling Supine to Right Side - Progress: Progressing toward goal Pt will Roll Supine to Left Side: with supervision PT Goal: Rolling Supine to Left Side - Progress: Progressing toward goal Pt will go Supine/Side to Sit: with supervision PT Goal: Supine/Side to Sit - Progress: Progressing toward goal Pt will go Sit to Supine/Side: with supervision PT Goal: Sit to Supine/Side - Progress: Progressing toward goal Pt will go Sit to Stand: with supervision PT Goal: Sit to Stand - Progress: Progressing toward goal Pt will go Stand to Sit: with supervision PT Goal: Stand to Sit - Progress: Progressing toward goal Pt will Ambulate: 51 - 150 feet;with supervision;with rolling walker PT Goal: Ambulate - Progress: Progressing toward goal Pt will Go Up / Down Stairs: 1-2 stairs;with min assist;with least restrictive assistive device  PT Treatment Precautions/Restrictions  Precautions Precautions: Fall Restrictions Weight Bearing Restrictions: No Mobility (including Balance) Bed Mobility Supine to Sit: 4: Min assist;5: Supervision (min assist on first attempt and Sup on second) Sit to Supine: 5: Supervision Transfers Sit to Stand: 5: Supervision Sit to Stand Details (indicate cue type and reason): cues for use of UEs Stand to Sit: 5: Supervision Stand to  Sit Details: cues for use of UEs Ambulation/Gait Ambulation/Gait Assistance: 5: Supervision Ambulation/Gait Assistance Details (indicate cue type and reason): min cues for posture and position from RW Ambulation Distance (Feet): 400 Feet Assistive device: Rolling walker Gait Pattern: Step-through pattern    Exercise    End of Session PT - End of Session Activity Tolerance: Patient tolerated treatment well Patient left: in chair;with call bell in reach Nurse Communication: Mobility status for transfers;Mobility status for ambulation General Behavior During Session: [facility] for tasks performed Cognition: [facility] for tasks performed  Oswin Johal 01/18/2012, 1:10 PM

## 2012-01-18 NOTE — Progress Notes (Addendum)
CM spoke with pt concerning dc planning. Pt suggest HHPT, per pt choice no HH services required. Adult son & daughter to assist in home care. Per MD pt stable to dc home, pt refuses SNF. No orders entered for [facility] services. Per RN, non needed per MD.    Leonie Green [phone]

## 2012-01-18 NOTE — Progress Notes (Signed)
Pt stable, scripts, and d/c instructions given.  No questions/concerns voiced by patient.  Pt refused SNP placement and home health assistance.  Pt transported to private vehicle via wheelchair with RN.

## 2012-05-28 ENCOUNTER — Other Ambulatory Visit: Payer: Self-pay | Admitting: Internal Medicine

## 2012-05-28 DIAGNOSIS — Z1231 Encounter for screening mammogram for malignant neoplasm of breast: Secondary | ICD-10-CM

## 2012-06-04 ENCOUNTER — Other Ambulatory Visit: Payer: Self-pay | Admitting: Dermatology

## 2012-06-25 ENCOUNTER — Ambulatory Visit
Admission: RE | Admit: 2012-06-25 | Discharge: 2012-06-25 | Disposition: A | Discharge status: Home / Self Care | Payer: Medicare Other | Source: Ambulatory Visit | Attending: Internal Medicine | Admitting: Internal Medicine

## 2012-06-25 DIAGNOSIS — Z1231 Encounter for screening mammogram for malignant neoplasm of breast: Secondary | ICD-10-CM

## 2012-07-16 ENCOUNTER — Other Ambulatory Visit: Payer: Self-pay | Admitting: Dermatology

## 2012-07-17 ENCOUNTER — Emergency Department (HOSPITAL_COMMUNITY): Payer: Medicare Other

## 2012-07-17 ENCOUNTER — Encounter (HOSPITAL_COMMUNITY): Payer: Self-pay | Admitting: Emergency Medicine

## 2012-07-17 ENCOUNTER — Emergency Department (HOSPITAL_COMMUNITY)
Admission: EM | Admit: 2012-07-17 | Discharge: 2012-07-18 | Disposition: A | Discharge status: Home / Self Care | Payer: Medicare Other | Attending: Emergency Medicine | Admitting: Emergency Medicine

## 2012-07-17 DIAGNOSIS — F411 Generalized anxiety disorder: Secondary | ICD-10-CM | POA: Insufficient documentation

## 2012-07-17 DIAGNOSIS — M810 Age-related osteoporosis without current pathological fracture: Secondary | ICD-10-CM | POA: Insufficient documentation

## 2012-07-17 DIAGNOSIS — E119 Type 2 diabetes mellitus without complications: Secondary | ICD-10-CM | POA: Insufficient documentation

## 2012-07-17 DIAGNOSIS — M069 Rheumatoid arthritis, unspecified: Secondary | ICD-10-CM | POA: Insufficient documentation

## 2012-07-17 DIAGNOSIS — M549 Dorsalgia, unspecified: Secondary | ICD-10-CM | POA: Insufficient documentation

## 2012-07-17 MED ORDER — HYDROCODONE-ACETAMINOPHEN 5-325 MG PO TABS
2.0000 | ORAL_TABLET | Freq: Once | ORAL | Status: AC
  Administered 2012-07-17: 2 via ORAL
  Filled 2012-07-17: qty 2

## 2012-07-17 NOTE — ED Notes (Signed)
Patient is alert and oriented x3.  She is complaining of pain post fall that happened Tuesday evening. She rates her pain at 9 of 10 in her lower back.  Her right lower leg has an abrasion on it.

## 2012-07-17 NOTE — ED Provider Notes (Signed)
History     CSN: [id number]  Arrival date & time 07/17/12  1647   First MD Initiated Contact with Patient 07/17/12 2031      Chief Complaint  Patient presents with  . Fall  . Back Pain   HPI  History provided by the patient. Patient is a 77 year old female with history of diabetes, RA, osteoporosis who presents for evaluation of low back pain after a fall 3 days ago. Patient reports that she travel to [place] with her son for her brothers funeral. On the drive home last Tuesday she reports that object fell from a truck in front of them they went under the car and hit and under carriage. The patient arrived home she got out of the car with her son to inspect the front bumper. Patient normally walks with a walker but did have a cane with her. She began to bend forward and lost her balance causing her to fall over to her right side into gravel and grass. Patient denies any head injury or trauma. Denies any LOC. She complained of low back pain and abrasions to bilateral lower legs from the fall. She was helped up with her son and able to walk back into the house. Patient has been resting since that time has continued and slightly increased low back pains especially with any movements. Patient has been taking occasional Vicodin without much change. She denies any numbness or weakness in her extremities. Denies any upper back or neck pain.    Past Medical History  Diagnosis Date  . Osteoporosis   . Rheumatoid arthritis   . Murmur, cardiac   . Diabetes mellitus   . Pneumonia   . Anxiety   . Adrenal insufficiency, primary, iatrogenic   . Gait instability   . Physical deconditioning     Past Surgical History  Procedure Date  . Knee arthroscopy     2 on left  . Cholecystectomy   . Thyroidectomy   . Abdominal hysterectomy     No family history on file.  History  Substance Use Topics  . Smoking status: Never Smoker   . Smokeless tobacco: Never Used  . Alcohol Use: No     OB History    Grav Para Term Preterm Abortions TAB SAB Ect Mult Living                  Review of Systems  Constitutional: Negative for fever and chills.  HENT: Negative for neck pain.   Respiratory: Negative for cough and shortness of breath.   Cardiovascular: Negative for chest pain and leg swelling.  Gastrointestinal: Negative for nausea, vomiting, abdominal pain, diarrhea and constipation.  Genitourinary: Positive for frequency. Negative for dysuria, hematuria and flank pain.  Musculoskeletal: Positive for back pain.  Neurological: Negative for dizziness, weakness, light-headedness, numbness and headaches.    Allergies  Penicillins  Home Medications   Current Outpatient Rx  Name Route Sig Dispense Refill  . ALENDRONATE SODIUM 70 MG PO TABS Oral Take 70 mg by mouth every 7 (seven) days. Take with a full glass of water on an empty stomach.[name] on sundays per patient.     . ALPRAZOLAM 1 MG PO TABS Oral Take 1 mg by mouth 3 (three) times daily. Patient states she takes 3 times a day every day per patient.     Marland Kitchen AMITRIPTYLINE HCL 25 MG PO TABS Oral Take 50 mg by mouth at bedtime.     . BUPROPION HCL 75 MG  PO TABS Oral Take 75 mg by mouth every morning.     Marland Kitchen HYPROMELLOSE 2.5 % OP SOLN Both Eyes Place 1 drop into both eyes as needed. For dry eyes     . PANCRELIPASE (LIP-PROT-AMYL) 12000 UNITS PO CPEP Oral Take 1 capsule by mouth 3 (three) times daily before meals. 270 capsule 1  . MIRTAZAPINE 30 MG PO TABS Oral Take 30 mg by mouth at bedtime.      Marland Kitchen PREDNISONE 10 MG PO TABS Oral Take 15 mg by mouth daily.    . SULFASALAZINE 500 MG PO TABS Oral Take 500 mg by mouth 2 (two) times daily.     Marland Kitchen VITAMIN D (ERGOCALCIFEROL) 50000 UNITS PO CAPS Oral Take 50,000 Units by mouth every 7 (seven) days. Takes every Sunday per patient.       BP 125/75  Pulse 105  Temp 97.7 F (36.5 C) (Oral)  Resp 15  SpO2 92%  Physical Exam  Nursing note and vitals reviewed. Constitutional: She  is oriented to person, place, and time. She appears well-developed and well-nourished. No distress.  HENT:  Head: Normocephalic and atraumatic.       Bandage over right cheek consistent with history of skin biopsy. No erythema of skin  Neck: Normal range of motion.  Cardiovascular: Normal rate and regular rhythm.   No murmur heard. Pulmonary/Chest: Effort normal and breath sounds normal. No respiratory distress. She has no wheezes. She has no rales. She exhibits no tenderness.  Abdominal: Soft. There is no tenderness. There is no rebound and no guarding.  Musculoskeletal:       Cervical back: Normal.       Thoracic back: Normal.       Lumbar back: She exhibits tenderness.       Back:       Mild tenderness over left deltoid area. No swelling or gross deformities. Normal range of motion. Normal grip strength, distal radial pulses and sensation in hand.  Bruising to bilateral lower legs and shins. There are 2 superficial skin tears/abrasions to right shin. No deformities or tenderness along the bone. Normal dorsal pedal pulses bilaterally. Normal range of motion in legs.  Hips stable without tenderness to palpation. Normal range of motion.  Neurological: She is alert and oriented to person, place, and time.  Skin: Skin is warm and dry. No rash noted.  Psychiatric: She has a normal mood and affect. Her behavior is normal.    ED Course  Procedures   Results for orders placed during the hospital encounter of 07/17/12  URINALYSIS, ROUTINE W REFLEX MICROSCOPIC      Component Value Range   Color, Urine YELLOW  YELLOW   APPearance CLEAR  CLEAR   Specific Gravity, Urine 1.008  1.005 - 1.030   pH 6.0  5.0 - 8.0   Glucose, UA NEGATIVE  NEGATIVE mg/dL   Hgb urine dipstick NEGATIVE  NEGATIVE   Bilirubin Urine NEGATIVE  NEGATIVE   Ketones, ur NEGATIVE  NEGATIVE mg/dL   Protein, ur NEGATIVE  NEGATIVE mg/dL   Urobilinogen, UA 0.2  0.0 - 1.0 mg/dL   Nitrite POSITIVE (*) NEGATIVE   Leukocytes,  UA SMALL (*) NEGATIVE  URINE MICROSCOPIC-ADD ON      Component Value Range   Squamous Epithelial / LPF RARE  RARE   WBC, UA 7-10  <3 WBC/hpf   RBC / HPF 0-2  <3 RBC/hpf   Bacteria, UA MANY (*) RARE      Dg Lumbar Spine Complete  07/17/2012  *RADIOLOGY REPORT*  Clinical Data: Fall, back pain  LUMBAR SPINE - COMPLETE 4+ VIEW  Comparison: Lumbar spine MRI dated 01/17/2012  Findings: Five all lumbar-type vertebral bodies.  Mild straightening of the lumbar spine.  Mild superior endplate compression deformity at L4, progressed from prior MRI.  Otherwise, vertebral body heights are maintained.  Moderate multilevel degenerative changes.  Left total hip arthroplasty.  Cholecystectomy clips  IMPRESSION: Mild superior endplate compression deformity at L4, progressed from prior MRI.  Moderate multilevel degenerative changes.   Original Report Authenticated By: Charline Bills, M.D.      1. Back pain       MDM  8:30PM patient seen and evaluated. Patient appears comfortable lying in bed. No acute distress.  Patient reports good improvement of pain after medications. Patient has been up ambulating with her cane to the bathroom.  Patient was able to void fortunately her urine was mislabeled and will need to be collected. Patient informed of the mistake and delay.  Patient was discussed with attending physician. X-rays show slight compression deformity at L4 change from MRI earlier this year. No other concerning findings. Plan to have patient return home with additional pain medicines. UA still pending.   Urine has finally resulted with concerns for a UTI. Will prescribe Macrobid given previous culture results.   Angus Seller, [place] 07/18/12 [phone]

## 2012-07-17 NOTE — ED Notes (Signed)
Pt stated that she was bending down to look at back bumper on her car on Tuesday 07/14/12, when she was bending down she fell over forward. Pt hurt right shin, has 2 differ abrasions.  Pt states she has been having back pain since, that is gradually gotten worse each day since.  Pt states that she has used ice bags, and taken pain medications but still doesn't help.  Pain 9/10

## 2012-07-17 NOTE — ED Notes (Signed)
Pt has a urine specimen at the triage nurses station.

## 2012-07-18 LAB — URINALYSIS, ROUTINE W REFLEX MICROSCOPIC
Glucose, UA: NEGATIVE mg/dL
Hgb urine dipstick: NEGATIVE
Protein, ur: NEGATIVE mg/dL
Specific Gravity, Urine: 1.008 (ref 1.005–1.030)
Urobilinogen, UA: 0.2 mg/dL (ref 0.0–1.0)

## 2012-07-18 LAB — URINE MICROSCOPIC-ADD ON

## 2012-07-18 MED ORDER — NITROFURANTOIN MONOHYD MACRO 100 MG PO CAPS: 100.0000 mg | ORAL_CAPSULE | Freq: Two times a day (BID) | ORAL | Status: DC

## 2012-07-18 NOTE — ED Notes (Signed)
Patient is alert and oriented x3.  She was given DC instructions and follow up visit instructions.  Patient gave verbal understanding. She was DC ambulatory under his own power to home.  V/S stable.  He was not showing any signs of distress on DC 

## 2012-07-21 ENCOUNTER — Other Ambulatory Visit (HOSPITAL_COMMUNITY): Payer: Self-pay | Admitting: *Deleted

## 2012-07-21 ENCOUNTER — Ambulatory Visit (HOSPITAL_COMMUNITY)
Admission: RE | Admit: 2012-07-21 | Discharge: 2012-07-21 | Disposition: A | Discharge status: Home / Self Care | Payer: Medicare Other | Source: Ambulatory Visit | Attending: Internal Medicine | Admitting: Internal Medicine

## 2012-07-21 DIAGNOSIS — R52 Pain, unspecified: Secondary | ICD-10-CM

## 2012-07-21 DIAGNOSIS — M25559 Pain in unspecified hip: Secondary | ICD-10-CM | POA: Insufficient documentation

## 2012-07-22 NOTE — ED Provider Notes (Signed)
Medical screening examination/treatment/procedure(s) were performed by non-physician practitioner and as supervising physician I was immediately available for consultation/collaboration.  Jones Skene, M.D.     Jones Skene, MD 07/22/12 1233

## 2012-08-13 ENCOUNTER — Other Ambulatory Visit: Payer: Self-pay | Admitting: Dermatology

## 2012-09-24 ENCOUNTER — Other Ambulatory Visit: Payer: Self-pay | Admitting: Dermatology

## 2012-11-08 ENCOUNTER — Encounter (HOSPITAL_COMMUNITY): Payer: Self-pay | Admitting: *Deleted

## 2012-11-08 ENCOUNTER — Inpatient Hospital Stay (HOSPITAL_COMMUNITY)
Admission: EM | Admit: 2012-11-08 | Discharge: 2012-11-12 | DRG: 689 | Disposition: A | Discharge status: Home Health | Payer: Medicare Other | Attending: Internal Medicine | Admitting: Internal Medicine

## 2012-11-08 ENCOUNTER — Emergency Department (HOSPITAL_COMMUNITY): Payer: Medicare Other

## 2012-11-08 ENCOUNTER — Other Ambulatory Visit: Payer: Self-pay

## 2012-11-08 DIAGNOSIS — K8689 Other specified diseases of pancreas: Secondary | ICD-10-CM | POA: Diagnosis present

## 2012-11-08 DIAGNOSIS — E2740 Unspecified adrenocortical insufficiency: Secondary | ICD-10-CM | POA: Diagnosis present

## 2012-11-08 DIAGNOSIS — A498 Other bacterial infections of unspecified site: Secondary | ICD-10-CM | POA: Diagnosis present

## 2012-11-08 DIAGNOSIS — M069 Rheumatoid arthritis, unspecified: Secondary | ICD-10-CM | POA: Diagnosis present

## 2012-11-08 DIAGNOSIS — E119 Type 2 diabetes mellitus without complications: Secondary | ICD-10-CM | POA: Diagnosis present

## 2012-11-08 DIAGNOSIS — J09X1 Influenza due to identified novel influenza A virus with pneumonia: Secondary | ICD-10-CM | POA: Diagnosis present

## 2012-11-08 DIAGNOSIS — J189 Pneumonia, unspecified organism: Secondary | ICD-10-CM | POA: Diagnosis present

## 2012-11-08 DIAGNOSIS — R5381 Other malaise: Secondary | ICD-10-CM | POA: Diagnosis present

## 2012-11-08 DIAGNOSIS — R05 Cough: Secondary | ICD-10-CM

## 2012-11-08 DIAGNOSIS — B9620 Unspecified Escherichia coli [E. coli] as the cause of diseases classified elsewhere: Secondary | ICD-10-CM | POA: Diagnosis present

## 2012-11-08 DIAGNOSIS — R2681 Unsteadiness on feet: Secondary | ICD-10-CM

## 2012-11-08 DIAGNOSIS — R269 Unspecified abnormalities of gait and mobility: Secondary | ICD-10-CM | POA: Diagnosis present

## 2012-11-08 DIAGNOSIS — J310 Chronic rhinitis: Secondary | ICD-10-CM | POA: Diagnosis present

## 2012-11-08 DIAGNOSIS — R531 Weakness: Secondary | ICD-10-CM | POA: Diagnosis present

## 2012-11-08 DIAGNOSIS — N390 Urinary tract infection, site not specified: Secondary | ICD-10-CM

## 2012-11-08 DIAGNOSIS — J101 Influenza due to other identified influenza virus with other respiratory manifestations: Secondary | ICD-10-CM | POA: Diagnosis present

## 2012-11-08 DIAGNOSIS — M545 Low back pain: Secondary | ICD-10-CM

## 2012-11-08 DIAGNOSIS — J0130 Acute sphenoidal sinusitis, unspecified: Secondary | ICD-10-CM

## 2012-11-08 DIAGNOSIS — E2749 Other adrenocortical insufficiency: Secondary | ICD-10-CM | POA: Diagnosis present

## 2012-11-08 DIAGNOSIS — R627 Adult failure to thrive: Secondary | ICD-10-CM | POA: Diagnosis present

## 2012-11-08 DIAGNOSIS — IMO0001 Reserved for inherently not codable concepts without codable children: Secondary | ICD-10-CM

## 2012-11-08 DIAGNOSIS — R509 Fever, unspecified: Secondary | ICD-10-CM

## 2012-11-08 LAB — URINALYSIS, ROUTINE W REFLEX MICROSCOPIC
Glucose, UA: NEGATIVE mg/dL
Protein, ur: 100 mg/dL — AB
Urobilinogen, UA: 1 mg/dL (ref 0.0–1.0)

## 2012-11-08 LAB — CBC WITH DIFFERENTIAL/PLATELET
Basophils Absolute: 0 10*3/uL (ref 0.0–0.1)
Basophils Relative: 0 % (ref 0–1)
Eosinophils Absolute: 0 10*3/uL (ref 0.0–0.7)
Eosinophils Relative: 0 % (ref 0–5)
HCT: 41.2 % (ref 36.0–46.0)
Hemoglobin: 13.7 g/dL (ref 12.0–15.0)
MCH: 31.4 pg (ref 26.0–34.0)
MCHC: 33.3 g/dL (ref 30.0–36.0)
Monocytes Absolute: 1.2 10*3/uL — ABNORMAL HIGH (ref 0.1–1.0)
Monocytes Relative: 11 % (ref 3–12)
RDW: 13 % (ref 11.5–15.5)

## 2012-11-08 LAB — URINE MICROSCOPIC-ADD ON

## 2012-11-08 LAB — POCT I-STAT, CHEM 8
BUN: 11 mg/dL (ref 6–23)
Calcium, Ion: 1.05 mmol/L — ABNORMAL LOW (ref 1.13–1.30)
Creatinine, Ser: 0.7 mg/dL (ref 0.50–1.10)
Glucose, Bld: 94 mg/dL (ref 70–99)
Sodium: 136 mEq/L (ref 135–145)
TCO2: 26 mmol/L (ref 0–100)

## 2012-11-08 LAB — GLUCOSE, CAPILLARY: Glucose-Capillary: 92 mg/dL (ref 70–99)

## 2012-11-08 MED ORDER — LEVOFLOXACIN IN D5W 750 MG/150ML IV SOLN
750.0000 mg | Freq: Once | INTRAVENOUS | Status: AC
  Administered 2012-11-08: 750 mg via INTRAVENOUS
  Filled 2012-11-08: qty 150

## 2012-11-08 MED ORDER — SODIUM CHLORIDE 0.9 % IV SOLN
Freq: Once | INTRAVENOUS | Status: AC
  Administered 2012-11-08: via INTRAVENOUS

## 2012-11-08 NOTE — ED Provider Notes (Signed)
History     CSN: [id number]  Arrival date & time 11/08/12  2008   First MD Initiated Contact with Patient 11/08/12 2125      Chief Complaint  Patient presents with  . Altered Mental Status  . Cough    (Consider location/radiation/quality/duration/timing/severity/associated sxs/prior treatment) HPI Comments: Patient was last seen by family 1 week ago and spoken to 5 days ago at which time she was at her normal baseline.  Tonight neighbors called daughter because patient has been vomiting, having diarrhea, and is altered form baseline.    Patient is a 78 y.o. female presenting with altered mental status and cough. The history is provided by the patient and a relative.  Altered Mental Status This is a recurrent problem. The current episode started in the past 7 days. The problem occurs constantly. Associated symptoms include a change in bowel habit, coughing, myalgias, nausea, vomiting and weakness. Pertinent negatives include no abdominal pain, anorexia, chest pain, chills, fever, rash, sore throat, swollen glands or urinary symptoms. She has tried nothing for the symptoms.  Cough Associated symptoms include myalgias. Pertinent negatives include no chest pain, no chills, no rhinorrhea and no sore throat.    Past Medical History  Diagnosis Date  . Osteoporosis   . Rheumatoid arthritis   . Murmur, cardiac   . Diabetes mellitus   . Pneumonia   . Anxiety   . Adrenal insufficiency, primary, iatrogenic   . Gait instability   . Physical deconditioning     Past Surgical History  Procedure Date  . Knee arthroscopy     2 on left  . Cholecystectomy   . Thyroidectomy   . Abdominal hysterectomy     History reviewed. No pertinent family history.  History  Substance Use Topics  . Smoking status: Never Smoker   . Smokeless tobacco: Never Used  . Alcohol Use: No    OB History    Grav Para Term Preterm Abortions TAB SAB Ect Mult Living                  Review of Systems    Constitutional: Negative for fever and chills.  HENT: Negative for sore throat and rhinorrhea.   Respiratory: Positive for cough.   Cardiovascular: Negative for chest pain and leg swelling.  Gastrointestinal: Positive for nausea, vomiting, diarrhea and change in bowel habit. Negative for abdominal pain and anorexia.  Genitourinary: Positive for decreased urine volume.  Musculoskeletal: Positive for myalgias.  Skin: Positive for pallor. Negative for rash.  Neurological: Positive for weakness. Negative for syncope and speech difficulty.  Psychiatric/Behavioral: Positive for altered mental status.    Allergies  Penicillins  Home Medications   Current Outpatient Rx  Name  Route  Sig  Dispense  Refill  . ALENDRONATE SODIUM 70 MG PO TABS   Oral   Take 70 mg by mouth every 7 (seven) days. Take with a full glass of water on an empty stomach.[name] on sundays per patient.          . ALPRAZOLAM 1 MG PO TABS   Oral   Take 1 mg by mouth 3 (three) times daily.          Marland Kitchen AMITRIPTYLINE HCL 25 MG PO TABS   Oral   Take 50 mg by mouth at bedtime.          . BUPROPION HCL 75 MG PO TABS   Oral   Take 75 mg by mouth every morning.          Marland Kitchen  HYPROMELLOSE 2.5 % OP SOLN   Both Eyes   Place 1 drop into both eyes as needed. For dry eyes          . PANCRELIPASE (LIP-PROT-AMYL) 12000 UNITS PO CPEP   Oral   Take 1 capsule by mouth 3 (three) times daily before meals.   270 capsule   1   . MIRTAZAPINE 30 MG PO TABS   Oral   Take 30 mg by mouth at bedtime.           Marland Kitchen PREDNISONE 5 MG PO TABS   Oral   Take 15 mg by mouth daily.          . SULFASALAZINE 500 MG PO TABS   Oral   Take 500 mg by mouth 2 (two) times daily.          Marland Kitchen VITAMIN D (ERGOCALCIFEROL) 50000 UNITS PO CAPS   Oral   Take 50,000 Units by mouth every 7 (seven) days. Takes every Sunday per patient.            BP 124/73  Pulse 112  Temp 99 F (37.2 C) (Oral)  Resp 22  Ht 5\' 5"  (1.651 m)  Wt 140  lb (63.504 kg)  BMI 23.30 kg/m2  SpO2 98%  Physical Exam  Constitutional: She appears well-developed and well-nourished. No distress.  HENT:  Head: Normocephalic and atraumatic.  Right Ear: External ear normal.  Left Ear: External ear normal.  Nose: Nose normal.  Mouth/Throat: Oropharynx is clear and moist. No oropharyngeal exudate.  Eyes: Pupils are equal, round, and reactive to light.  Neck: Normal range of motion.  Cardiovascular: Tachycardia present.   Pulmonary/Chest: Effort normal and breath sounds normal. She has no wheezes.       Moist cough  Abdominal: Soft. She exhibits no distension. There is no tenderness.  Musculoskeletal: Normal range of motion. She exhibits edema.  Neurological: She is alert.  Skin: Skin is warm and dry. No rash noted. There is pallor.    ED Course  Procedures (including critical care time)  Labs Reviewed  CBC WITH DIFFERENTIAL - Abnormal; Notable for the following:    WBC 10.7 (*)     Neutrophils Relative 80 (*)     Neutro Abs 8.5 (*)     Lymphocytes Relative 9 (*)     Monocytes Absolute 1.2 (*)     All other components within normal limits  URINALYSIS, ROUTINE W REFLEX MICROSCOPIC - Abnormal; Notable for the following:    APPearance CLOUDY (*)     Hgb urine dipstick SMALL (*)     Bilirubin Urine SMALL (*)     Ketones, ur TRACE (*)     Protein, ur 100 (*)     Leukocytes, UA MODERATE (*)     All other components within normal limits  POCT I-STAT, CHEM 8 - Abnormal; Notable for the following:    Calcium, Ion 1.05 (*)     All other components within normal limits  URINE MICROSCOPIC-ADD ON - Abnormal; Notable for the following:    Bacteria, UA MANY (*)     All other components within normal limits  GLUCOSE, CAPILLARY  LACTIC ACID, PLASMA  URINE CULTURE   Dg Chest 2 View  11/08/2012  *RADIOLOGY REPORT*  Clinical Data: Cough; altered mental status.  CHEST - 2 VIEW  Comparison: Chest radiograph performed 11/11/2011  Findings: The lungs  are well-aerated.  Mild bibasilar opacities are noted; given the appearance on prior CT, this raises concern  for mild pneumonia.  There is no evidence of pleural effusion or pneumothorax.  The heart is normal in size; calcification is noted within the aortic arch.  No acute osseous abnormalities are seen.  IMPRESSION: Mild bibasilar opacities could reflect mild pneumonia.   Original Report Authenticated By: Tonia Ghent, M.D.    Ct Head Wo Contrast  11/08/2012  *RADIOLOGY REPORT*  Clinical Data: Altered mental status.  Confusion.  CT HEAD WITHOUT CONTRAST  Technique:  Contiguous axial images were obtained from the base of the skull through the vertex without contrast.  Comparison: CT of the head performed 11/15/2011  Findings: There is no evidence of acute infarction, mass lesion, or intra- or extra-axial hemorrhage on CT.  Scattered periventricular and subcortical white matter change likely reflects small vessel ischemic microangiopathy.  A tiny focus of increased attenuation along the atrium of the right lateral ventricle may reflect minimal calcification, stable from the prior study.  The posterior fossa, including the cerebellum, brainstem and fourth ventricle, is within normal limits.  The third and lateral ventricles, and basal ganglia are unremarkable in appearance.  The cerebral hemispheres demonstrate grossly normal gray-white differentiation.  No mass effect or midline shift is seen.  There is no evidence of fracture; visualized osseous structures are unremarkable in appearance.  The orbits are within normal limits. The paranasal sinuses and mastoid air cells are well-aerated.  No significant soft tissue abnormalities are seen.  IMPRESSION:  1.  No acute intracranial pathology seen on CT. 2.  Mild small vessel ischemic microangiopathy.   Original Report Authenticated By: Tonia Ghent, M.D.      1. CAP (community acquired pneumonia)   2. UTI (lower urinary tract infection)     ED ECG REPORT    Date: 11/08/2012  EKG Time: 11:27 PM  Rate: 112  Rhythm: sinus tachycardia,   Nonspecific intrventricular conduction delay   Axis: normal  Intervals:none  ST&T Change: none  Narrative Interpretation: abnormal            MDM   Spoke with Dr Evlyn Kanner who is admitting for Dr. Sheran Fava, NP 11/08/12 2327

## 2012-11-08 NOTE — ED Notes (Signed)
Bed:WA22<BR> Expected date:<BR> Expected time:<BR> Means of arrival:<BR> Comments:<BR> Triage 1

## 2012-11-08 NOTE — ED Notes (Signed)
The provider Dr Evlyn Kanner who is admitting for Dr. Jarold Motto states DO NOT CALL either Doctor this evening upon admittance of the patient to the hospital.  The Dr's will see her in the morning.

## 2012-11-08 NOTE — ED Provider Notes (Signed)
Medical screening examination/treatment/procedure(s) were conducted as a shared visit with non-physician practitioner(s) and myself.  I personally evaluated the patient during the encounter.  Pt nontoxic but had AMS with CAP await admit eval.  Hurman Horn, MD 11/15/12 2259

## 2012-11-08 NOTE — ED Notes (Signed)
Pt is confused and lives alone. Pt's daughter found out tonight that she was confused and had been ill w/ a cough because the neighbor called her. Pt's daughter has not seen her for a week. Pt's daughter states that her mother has been ill like this before.

## 2012-11-09 ENCOUNTER — Encounter (HOSPITAL_COMMUNITY): Payer: Self-pay | Admitting: *Deleted

## 2012-11-09 DIAGNOSIS — K8689 Other specified diseases of pancreas: Secondary | ICD-10-CM

## 2012-11-09 HISTORY — DX: Other specified diseases of pancreas: K86.89

## 2012-11-09 LAB — CBC
HCT: 39.7 % (ref 36.0–46.0)
Hemoglobin: 13.3 g/dL (ref 12.0–15.0)
MCH: 31.4 pg (ref 26.0–34.0)
MCHC: 33.5 g/dL (ref 30.0–36.0)
MCV: 93.6 fL (ref 78.0–100.0)

## 2012-11-09 LAB — CREATININE, SERUM: GFR calc non Af Amer: 87 mL/min — ABNORMAL LOW (ref >90)

## 2012-11-09 LAB — GLUCOSE, CAPILLARY

## 2012-11-09 MED ORDER — ZOLPIDEM TARTRATE 5 MG PO TABS: 5.0000 mg | ORAL_TABLET | Freq: Every evening | ORAL | Status: DC | PRN

## 2012-11-09 MED ORDER — ACETAMINOPHEN 650 MG RE SUPP: 650.0000 mg | Freq: Four times a day (QID) | RECTAL | Status: DC | PRN

## 2012-11-09 MED ORDER — ONDANSETRON HCL 4 MG/2ML IJ SOLN: 4.0000 mg | Freq: Three times a day (TID) | INTRAMUSCULAR | Status: AC | PRN

## 2012-11-09 MED ORDER — ENOXAPARIN SODIUM 40 MG/0.4ML ~~LOC~~ SOLN
40.0000 mg | SUBCUTANEOUS | Status: DC
  Administered 2012-11-09 – 2012-11-12 (×4): 40 mg via SUBCUTANEOUS
  Filled 2012-11-09 (×4): qty 0.4

## 2012-11-09 MED ORDER — FLEET ENEMA 7-19 GM/118ML RE ENEM: 1.0000 | ENEMA | Freq: Once | RECTAL | Status: AC | PRN

## 2012-11-09 MED ORDER — GUAIFENESIN 100 MG/5ML PO SOLN
10.0000 mL | Freq: Four times a day (QID) | ORAL | Status: DC | PRN
  Administered 2012-11-09 – 2012-11-10 (×3): 200 mg via ORAL
  Filled 2012-11-09 (×3): qty 10

## 2012-11-09 MED ORDER — ALPRAZOLAM 1 MG PO TABS
1.0000 mg | ORAL_TABLET | Freq: Three times a day (TID) | ORAL | Status: DC
  Administered 2012-11-09 – 2012-11-12 (×9): 1 mg via ORAL
  Filled 2012-11-09 (×9): qty 1

## 2012-11-09 MED ORDER — HYPROMELLOSE (GONIOSCOPIC) 2.5 % OP SOLN: 1.0000 [drp] | Freq: Three times a day (TID) | OPHTHALMIC | Status: DC | PRN

## 2012-11-09 MED ORDER — LEVOFLOXACIN IN D5W 750 MG/150ML IV SOLN
750.0000 mg | INTRAVENOUS | Status: DC
  Administered 2012-11-09 – 2012-11-12 (×4): 750 mg via INTRAVENOUS
  Filled 2012-11-09 (×4): qty 150

## 2012-11-09 MED ORDER — AMITRIPTYLINE HCL 50 MG PO TABS
50.0000 mg | ORAL_TABLET | Freq: Every day | ORAL | Status: DC
  Administered 2012-11-09 – 2012-11-11 (×3): 50 mg via ORAL
  Filled 2012-11-09 (×4): qty 1

## 2012-11-09 MED ORDER — POLYVINYL ALCOHOL 1.4 % OP SOLN
1.0000 [drp] | Freq: Three times a day (TID) | OPHTHALMIC | Status: DC | PRN
  Administered 2012-11-10: 1 [drp] via OPHTHALMIC
  Filled 2012-11-09: qty 15

## 2012-11-09 MED ORDER — POTASSIUM CHLORIDE IN NACL 20-0.9 MEQ/L-% IV SOLN
INTRAVENOUS | Status: DC
  Administered 2012-11-09 – 2012-11-12 (×6): via INTRAVENOUS
  Filled 2012-11-09 (×8): qty 1000

## 2012-11-09 MED ORDER — BIOTENE DRY MOUTH MT LIQD
15.0000 mL | Freq: Two times a day (BID) | OROMUCOSAL | Status: DC
  Administered 2012-11-09 – 2012-11-12 (×7): 15 mL via OROMUCOSAL

## 2012-11-09 MED ORDER — METHYLPREDNISOLONE SODIUM SUCC 125 MG IJ SOLR
60.0000 mg | Freq: Two times a day (BID) | INTRAMUSCULAR | Status: DC
  Administered 2012-11-09 – 2012-11-12 (×7): 60 mg via INTRAVENOUS
  Filled 2012-11-09 (×8): qty 0.96

## 2012-11-09 MED ORDER — MIRTAZAPINE 30 MG PO TABS
30.0000 mg | ORAL_TABLET | Freq: Every day | ORAL | Status: DC
  Administered 2012-11-09 – 2012-11-11 (×3): 30 mg via ORAL
  Filled 2012-11-09 (×4): qty 1

## 2012-11-09 MED ORDER — BUPROPION HCL 75 MG PO TABS
75.0000 mg | ORAL_TABLET | Freq: Every day | ORAL | Status: DC
  Administered 2012-11-09 – 2012-11-12 (×4): 75 mg via ORAL
  Filled 2012-11-09 (×4): qty 1

## 2012-11-09 MED ORDER — SODIUM CHLORIDE 0.9 % IV SOLN
INTRAVENOUS | Status: DC
  Administered 2012-11-09: 02:00:00 via INTRAVENOUS

## 2012-11-09 MED ORDER — HYDROCODONE-ACETAMINOPHEN 5-325 MG PO TABS: 1.0000 | ORAL_TABLET | ORAL | Status: DC | PRN

## 2012-11-09 MED ORDER — SULFASALAZINE 500 MG PO TABS
500.0000 mg | ORAL_TABLET | Freq: Two times a day (BID) | ORAL | Status: DC
  Administered 2012-11-09 – 2012-11-12 (×7): 500 mg via ORAL
  Filled 2012-11-09 (×9): qty 1

## 2012-11-09 MED ORDER — PANCRELIPASE (LIP-PROT-AMYL) 12000-38000 UNITS PO CPEP
1.0000 | ORAL_CAPSULE | Freq: Three times a day (TID) | ORAL | Status: DC
  Administered 2012-11-09 – 2012-11-12 (×9): 1 via ORAL
  Filled 2012-11-09 (×12): qty 1

## 2012-11-09 MED ORDER — HEPARIN SOD (PORK) LOCK FLUSH 100 UNIT/ML IV SOLN
INTRAVENOUS | Status: AC
  Filled 2012-11-09: qty 5

## 2012-11-09 MED ORDER — ACETAMINOPHEN 325 MG PO TABS: 650.0000 mg | ORAL_TABLET | Freq: Four times a day (QID) | ORAL | Status: DC | PRN

## 2012-11-09 MED ORDER — ALUM & MAG HYDROXIDE-SIMETH 200-200-20 MG/5ML PO SUSP: 30.0000 mL | Freq: Four times a day (QID) | ORAL | Status: DC | PRN

## 2012-11-09 MED ORDER — VITAMIN D (ERGOCALCIFEROL) 1.25 MG (50000 UNIT) PO CAPS
50000.0000 [IU] | ORAL_CAPSULE | ORAL | Status: DC
  Administered 2012-11-09: 50000 [IU] via ORAL
  Filled 2012-11-09: qty 1

## 2012-11-09 MED ORDER — BISACODYL 5 MG PO TBEC: 5.0000 mg | DELAYED_RELEASE_TABLET | Freq: Every day | ORAL | Status: DC | PRN

## 2012-11-09 NOTE — H&P (Signed)
[name] is an 78 y.o. female.   Chief Complaint: feel very weak HPI:   Patient is a 78 year old Caucasian woman with multiple medical problems as described below. She was in her usual state of somewhat compromised health living at home independently, and brought to the emergency department because neighbors had noted that she was having vomiting, diarrhea, and confusion. She has a daughter who is last seen her about 5 days prior to hospital admission. She has also noted some cough and nasal congestion. She has had less ability to ambulate with a walker or cane. Workup in the emergency room showed chest x-ray evidence of pneumonia as well as a urinary tract infection. Currently, she has mild dyspnea on exertion with a dry cough and significant nasal congestion. She has some nausea and no significant abdominal pain. She is quite frail and ambulates slowly with assistance devices such as a cane or walker. She is chronically on prednisone 15 mg daily because of severe rheumatoid arthritis.  Past Medical History  Diagnosis Date  . Osteoporosis   . Rheumatoid arthritis   . Murmur, cardiac   . Diabetes mellitus   . Pneumonia   . Anxiety   . Adrenal insufficiency, primary, iatrogenic   . Gait instability   . Physical deconditioning     Medications Prior to Admission  Medication Sig Dispense Refill  . alendronate (FOSAMAX) 70 MG tablet Take 70 mg by mouth every 7 (seven) days. Take with a full glass of water on an empty stomach.[name] on sundays per patient.       . ALPRAZolam (XANAX) 1 MG tablet Take 1 mg by mouth 3 (three) times daily.       Marland Kitchen amitriptyline (ELAVIL) 25 MG tablet Take 50 mg by mouth at bedtime.       Marland Kitchen buPROPion (WELLBUTRIN) 75 MG tablet Take 75 mg by mouth every morning.       . hydroxypropyl methylcellulose (ISOPTO TEARS) 2.5 % ophthalmic solution Place 1 drop into both eyes as needed. For dry eyes       . lipase/protease/amylase (CREON-10/PANCREASE) 12000 UNITS CPEP Take  1 capsule by mouth 3 (three) times daily before meals.  270 capsule  1  . mirtazapine (REMERON) 30 MG tablet Take 30 mg by mouth at bedtime.        . sulfaSALAzine (AZULFIDINE) 500 MG tablet Take 500 mg by mouth 2 (two) times daily.       . Vitamin D, Ergocalciferol, (DRISDOL) 50000 UNITS CAPS Take 50,000 Units by mouth every 7 (seven) days. Takes every Sunday per patient.       . [DISCONTINUED] predniSONE (DELTASONE) 5 MG tablet Take 15 mg by mouth daily.         ADDITIONAL HOME MEDICATIONS: No additional medications  PHYSICIANS INVOLVED IN CARE: Jarome Matin (primary care)  Past Surgical History  Procedure Date  . Knee arthroscopy     2 on left  . Cholecystectomy   . Thyroidectomy   . Abdominal hysterectomy     History reviewed. No pertinent family history.   Social History:  reports that she has never smoked. She has never used smokeless tobacco. She reports that she does not drink alcohol or use illicit drugs.  Allergies:  Allergies  Allergen Reactions  . Penicillins Hives     ROS: anemia, ankle swelling, arthritis, diabetes, heart murmur, shortness of breath and Depression, osteoporosis, gait instability, generalized weakness  PHYSICAL EXAM: Blood pressure 140/74, pulse 105, temperature 98.1 F (36.7  C), temperature source Oral, resp. rate 18, height 5\' 5"  (1.651 m), weight 67.2 kg (148 lb 2.4 oz), SpO2 95.00%. In general, she is a frail elderly white woman who was sleepy but easily awoken. She has significant sinus congestion and occasional dry cough, but no shortness of breath on nasal cannula oxygen. HEENT exam was otherwise unremarkable, neck was supple without jugular venous distention or carotid bruit, chest had minimal bibasilar crackles posteriorly, heart had a regular rate and rhythm with a systolic ejection murmur grade 2/6 at the left sternal border, abdomen had normal bowel sounds no tenderness, extremities have bilateral 1+ ankle edema (left greater than  right), and there is significant joint changes consistent with rheumatoid arthritis and multiple fingers of both hands. She was sleepy but easily awoken and was well oriented and could move all extremities somewhat.  Results for orders placed during the hospital encounter of 11/08/12 (from the past 48 hour(s))  GLUCOSE, CAPILLARY     Status: Normal   Collection Time   11/08/12  8:32 PM      Component Value Range Comment   Glucose-Capillary 92  70 - 99 mg/dL   CBC WITH DIFFERENTIAL     Status: Abnormal   Collection Time   11/08/12  9:28 PM      Component Value Range Comment   WBC 10.7 (*) 4.0 - 10.5 K/uL    RBC 4.37  3.87 - 5.11 MIL/uL    Hemoglobin 13.7  12.0 - 15.0 g/dL    HCT 16.1  09.6 - 04.5 %    MCV 94.3  78.0 - 100.0 fL    MCH 31.4  26.0 - 34.0 pg    MCHC 33.3  30.0 - 36.0 g/dL    RDW 40.9  81.1 - 91.4 %    Platelets 168  150 - 400 K/uL    Neutrophils Relative 80 (*) 43 - 77 %    Neutro Abs 8.5 (*) 1.7 - 7.7 K/uL    Lymphocytes Relative 9 (*) 12 - 46 %    Lymphs Abs 1.0  0.7 - 4.0 K/uL    Monocytes Relative 11  3 - 12 %    Monocytes Absolute 1.2 (*) 0.1 - 1.0 K/uL    Eosinophils Relative 0  0 - 5 %    Eosinophils Absolute 0.0  0.0 - 0.7 K/uL    Basophils Relative 0  0 - 1 %    Basophils Absolute 0.0  0.0 - 0.1 K/uL   LACTIC ACID, PLASMA     Status: Normal   Collection Time   11/08/12  9:28 PM      Component Value Range Comment   Lactic Acid, Venous 1.1  0.5 - 2.2 mmol/L   POCT I-STAT, CHEM 8     Status: Abnormal   Collection Time   11/08/12  9:48 PM      Component Value Range Comment   Sodium 136  135 - 145 mEq/L    Potassium 3.7  3.5 - 5.1 mEq/L    Chloride 101  96 - 112 mEq/L    BUN 11  6 - 23 mg/dL    Creatinine, Ser 7.82  0.50 - 1.10 mg/dL    Glucose, Bld 94  70 - 99 mg/dL    Calcium, Ion 9.56 (*) 1.13 - 1.30 mmol/L    TCO2 26  0 - 100 mmol/L    Hemoglobin 13.6  12.0 - 15.0 g/dL    HCT 21.3  08.6 - 57.8 %  URINALYSIS, ROUTINE W REFLEX MICROSCOPIC     Status:  Abnormal   Collection Time   11/08/12  9:53 PM      Component Value Range Comment   Color, Urine YELLOW  YELLOW    APPearance CLOUDY (*) CLEAR    Specific Gravity, Urine 1.019  1.005 - 1.030    pH 7.0  5.0 - 8.0    Glucose, UA NEGATIVE  NEGATIVE mg/dL    Hgb urine dipstick SMALL (*) NEGATIVE    Bilirubin Urine SMALL (*) NEGATIVE    Ketones, ur TRACE (*) NEGATIVE mg/dL    Protein, ur 161 (*) NEGATIVE mg/dL    Urobilinogen, UA 1.0  0.0 - 1.0 mg/dL    Nitrite NEGATIVE  NEGATIVE    Leukocytes, UA MODERATE (*) NEGATIVE   URINE MICROSCOPIC-ADD ON     Status: Abnormal   Collection Time   11/08/12  9:53 PM      Component Value Range Comment   WBC, UA 21-50  <3 WBC/hpf    RBC / HPF 7-10  <3 RBC/hpf    Bacteria, UA MANY (*) RARE    Urine-Other MUCOUS PRESENT      Dg Chest 2 View  11/08/2012  *RADIOLOGY REPORT*  Clinical Data: Cough; altered mental status.  CHEST - 2 VIEW  Comparison: Chest radiograph performed 11/11/2011  Findings: The lungs are well-aerated.  Mild bibasilar opacities are noted; given the appearance on prior CT, this raises concern for mild pneumonia.  There is no evidence of pleural effusion or pneumothorax.  The heart is normal in size; calcification is noted within the aortic arch.  No acute osseous abnormalities are seen.  IMPRESSION: Mild bibasilar opacities could reflect mild pneumonia.   Original Report Authenticated By: Tonia Ghent, M.D.    Ct Head Wo Contrast  11/08/2012  *RADIOLOGY REPORT*  Clinical Data: Altered mental status.  Confusion.  CT HEAD WITHOUT CONTRAST  Technique:  Contiguous axial images were obtained from the base of the skull through the vertex without contrast.  Comparison: CT of the head performed 11/15/2011  Findings: There is no evidence of acute infarction, mass lesion, or intra- or extra-axial hemorrhage on CT.  Scattered periventricular and subcortical white matter change likely reflects small vessel ischemic microangiopathy.  A tiny focus of  increased attenuation along the atrium of the right lateral ventricle may reflect minimal calcification, stable from the prior study.  The posterior fossa, including the cerebellum, brainstem and fourth ventricle, is within normal limits.  The third and lateral ventricles, and basal ganglia are unremarkable in appearance.  The cerebral hemispheres demonstrate grossly normal gray-white differentiation.  No mass effect or midline shift is seen.  There is no evidence of fracture; visualized osseous structures are unremarkable in appearance.  The orbits are within normal limits. The paranasal sinuses and mastoid air cells are well-aerated.  No significant soft tissue abnormalities are seen.  IMPRESSION:  1.  No acute intracranial pathology seen on CT. 2.  Mild small vessel ischemic microangiopathy.   Original Report Authenticated By: Tonia Ghent, M.D.      Assessment/Plan #1 Cough: Most likely due to community-acquired pneumonia, and possibly due to acute influenza.We will continue Levaquin and give stress dose corticosteroids IV. We will check a rapid flu test.  #2 Bacteriuria: from possible UTI versus colonization. We will check a urine culture and continue levaquin. #3 Physical Deconditioning and gait instability: moderately severe and worse than usual. She has minimal help from family and is at high risk for  injury from falls. We will set up a PT and OT evaluation prior to probable SNF rehabilitation.   Nakul Avino G 11/09/2012, 8:25 AM

## 2012-11-10 LAB — COMPREHENSIVE METABOLIC PANEL
ALT: 12 U/L (ref 0–35)
Calcium: 8.1 mg/dL — ABNORMAL LOW (ref 8.4–10.5)
GFR calc Af Amer: >90 mL/min (ref >90)
Glucose, Bld: 152 mg/dL — ABNORMAL HIGH (ref 70–99)
Sodium: 139 mEq/L (ref 135–145)
Total Protein: 6 g/dL (ref 6.0–8.3)

## 2012-11-10 LAB — INFLUENZA PANEL BY PCR (TYPE A & B)
H1N1 flu by pcr: DETECTED — AB
Influenza A By PCR: POSITIVE — AB
Influenza B By PCR: NEGATIVE

## 2012-11-10 LAB — GLUCOSE, CAPILLARY: Glucose-Capillary: 144 mg/dL — ABNORMAL HIGH (ref 70–99)

## 2012-11-10 LAB — CBC
HCT: 43.6 % (ref 36.0–46.0)
Hemoglobin: 14.8 g/dL (ref 12.0–15.0)
MCHC: 33.9 g/dL (ref 30.0–36.0)

## 2012-11-10 MED ORDER — OSELTAMIVIR PHOSPHATE 75 MG PO CAPS
75.0000 mg | ORAL_CAPSULE | Freq: Two times a day (BID) | ORAL | Status: DC
  Administered 2012-11-10 – 2012-11-12 (×5): 75 mg via ORAL
  Filled 2012-11-10 (×6): qty 1

## 2012-11-10 MED ORDER — ALBUTEROL SULFATE (5 MG/ML) 0.5% IN NEBU: 2.5000 mg | INHALATION_SOLUTION | Freq: Four times a day (QID) | RESPIRATORY_TRACT | Status: DC | PRN

## 2012-11-10 NOTE — Progress Notes (Signed)
Subjective: Today she has frequent dry cough, no dyspnea, somewhat decreased appetite.  Objective: Vital signs in last 24 hours: Temp:  [97.7 F (36.5 C)-98.7 F (37.1 C)] 97.7 F (36.5 C) (01/21 0445) Pulse Rate:  [99-109] 99  (01/21 0445) Resp:  [16-18] 18  (01/21 0445) BP: (111-125)/(72-95) 111/72 mmHg (01/21 0445) SpO2:  [95 %] 95 % (01/21 0445) Weight change:    Intake/Output from previous day: 01/20 0701 - 01/21 0700 In: 1657.5 [P.O.:240; I.V.:1417.5] Out: 1650 [Urine:1650]   General appearance: alert, cooperative and with frequent dry cough Resp: minimal bilateral scattered wheezing Cardio: regular rate and rhythm GI: soft, non-tender; bowel sounds normal; no masses,  no organomegaly Extremities: no peripheral edema  Lab Results:  Basename 11/10/12 0400 11/09/12 0952  WBC 6.5 10.6*  HGB 14.8 13.3  HCT 43.6 39.7  PLT 143* 131*   BMET  Basename 11/10/12 0400 11/09/12 0952 11/08/12 2148  NA 139 -- 136  K 4.4 -- 3.7  CL 104 -- 101  CO2 22 -- --  GLUCOSE 152* -- 94  BUN 11 -- 11  CREATININE 0.70 0.59 --  CALCIUM 8.1* -- --   CMET CMP     Component Value Date/Time   NA 139 11/10/2012 0400   K 4.4 11/10/2012 0400   CL 104 11/10/2012 0400   CO2 22 11/10/2012 0400   GLUCOSE 152* 11/10/2012 0400   BUN 11 11/10/2012 0400   CREATININE 0.70 11/10/2012 0400   CALCIUM 8.1* 11/10/2012 0400   CALCIUM 8.6 11/15/2011 0445   PROT 6.0 11/10/2012 0400   ALBUMIN 2.9* 11/10/2012 0400   AST 19 11/10/2012 0400   ALT 12 11/10/2012 0400   ALKPHOS 45 11/10/2012 0400   BILITOT 0.2* 11/10/2012 0400   GFRNONAA 83* 11/10/2012 0400   GFRAA >90 11/10/2012 0400    CBG (last 3)   Basename 11/10/12 0731 11/09/12 2338 11/09/12 0955  GLUCAP 144* 122* 79    INR RESULTS:   Lab Results  Component Value Date   INR 0.87 09/16/2011   INR 2.6* 05/12/2008   INR 2.3* 05/11/2008     Studies/Results: Dg Chest 2 View  11/08/2012  *RADIOLOGY REPORT*  Clinical Data: Cough; altered mental  status.  CHEST - 2 VIEW  Comparison: Chest radiograph performed 11/11/2011  Findings: The lungs are well-aerated.  Mild bibasilar opacities are noted; given the appearance on prior CT, this raises concern for mild pneumonia.  There is no evidence of pleural effusion or pneumothorax.  The heart is normal in size; calcification is noted within the aortic arch.  No acute osseous abnormalities are seen.  IMPRESSION: Mild bibasilar opacities could reflect mild pneumonia.   Original Report Authenticated By: Tonia Ghent, M.D.    Ct Head Wo Contrast  11/08/2012  *RADIOLOGY REPORT*  Clinical Data: Altered mental status.  Confusion.  CT HEAD WITHOUT CONTRAST  Technique:  Contiguous axial images were obtained from the base of the skull through the vertex without contrast.  Comparison: CT of the head performed 11/15/2011  Findings: There is no evidence of acute infarction, mass lesion, or intra- or extra-axial hemorrhage on CT.  Scattered periventricular and subcortical white matter change likely reflects small vessel ischemic microangiopathy.  A tiny focus of increased attenuation along the atrium of the right lateral ventricle may reflect minimal calcification, stable from the prior study.  The posterior fossa, including the cerebellum, brainstem and fourth ventricle, is within normal limits.  The third and lateral ventricles, and basal ganglia are unremarkable in appearance.  The cerebral hemispheres demonstrate grossly normal gray-white differentiation.  No mass effect or midline shift is seen.  There is no evidence of fracture; visualized osseous structures are unremarkable in appearance.  The orbits are within normal limits. The paranasal sinuses and mastoid air cells are well-aerated.  No significant soft tissue abnormalities are seen.  IMPRESSION:  1.  No acute intracranial pathology seen on CT. 2.  Mild small vessel ischemic microangiopathy.   Original Report Authenticated By: Tonia Ghent, M.D.      Medications: I have reviewed the patient's current medications.  Assessment/Plan: #1 Pneumonia: improved and no results from rapid flu test in so will start tamiflu empirically with albuterol nebs prn #2 Urinary Tract Infection: stable on levaquin with antibiotic sensitivities pending #3 Physical Deconditioning: moderately severe and PT evaluation is pending  LOS: 2 days   [name] 11/10/2012, 9:18 AM

## 2012-11-10 NOTE — Evaluation (Signed)
Physical Therapy Evaluation Patient Details Name: [name] MRN: [id number] DOB: 11/27/1934 Today's Date: 11/10/2012 Time: 3086-5784 PT Time Calculation (min): 27 min  PT Assessment / Plan / Recommendation Clinical Impression  78 yo female with multiple medical problems incuding OA, RA,and DM admitted with vomiting, diarrhea and confusion.  She continues to have deconditioning and needs cues to keep attemtion to task.  I feel she would benfit from short term SNF to regain strength for optimal safety at home, but patient refuses to consider SNF.  She will benefif from HHPT, HHOT  and aide.    PT Assessment  Patient needs continued PT services    Follow Up Recommendations  Home health PT;SNF    Does the patient have the potential to tolerate intense rehabilitation      Barriers to Discharge Decreased caregiver support      Equipment Recommendations  None recommended by PT    Recommendations for Other Services OT consult   Frequency Min 3X/week    Precautions / Restrictions Precautions Precautions: Fall Precaution Comments: generalized deconditioning Restrictions Weight Bearing Restrictions: No   Pertinent Vitals/Pain No c/o pain      Mobility  Bed Mobility Bed Mobility: Rolling Right;Rolling Left;Supine to Sit Rolling Right: 6: Modified independent (Device/Increase time) Rolling Left: 6: Modified independent (Device/Increase time) Supine to Sit: 6: Modified independent (Device/Increase time);With rails Details for Bed Mobility Assistance: pt takes extra time, uses rail to get to edge of bed on hospital mattress Transfers Transfers: Sit to Stand;Stand to Sit Sit to Stand: 6: Modified independent (Device/Increase time) Stand to Sit: 6: Modified independent (Device/Increase time) Details for Transfer Assistance: verbal cues to push up on armrests with arms.  pt takes extra time but is able to repeat sit to stand > 5 times Ambulation/Gait Ambulation/Gait Assistance:  6: Modified independent (Device/Increase time) Ambulation Distance (Feet): 125 Feet Assistive device: Rolling walker Ambulation/Gait Assistance Details: cues  to keep attention to taks Gait Pattern: Step-through pattern Gait velocity: decreased General Gait Details: pt appears to do well with RW Stairs: No Wheelchair Mobility Wheelchair Mobility: No    Shoulder Instructions     Exercises     PT Diagnosis: Difficulty walking;Generalized weakness  PT Problem List: Decreased strength;Decreased activity tolerance;Decreased balance;Decreased mobility;Decreased knowledge of use of DME PT Treatment Interventions: DME instruction;Gait training;Functional mobility training;Therapeutic activities;Therapeutic exercise;Stair training;Balance training   PT Goals Acute Rehab PT Goals PT Goal Formulation: With patient Time For Goal Achievement: 11/24/12 Potential to Achieve Goals: Good Pt will go Supine/Side to Sit: Independently PT Goal: Supine/Side to Sit - Progress: Goal set today Pt will go Sit to Supine/Side: Independently PT Goal: Sit to Supine/Side - Progress: Goal set today Pt will go Sit to Stand: Independently PT Goal: Sit to Stand - Progress: Not met Pt will go Stand to Sit: Independently PT Goal: Stand to Sit - Progress: Goal set today Pt will Ambulate: >150 feet;with modified independence;with least restrictive assistive device PT Goal: Ambulate - Progress: Goal set today Pt will Go Up / Down Stairs: 3-5 stairs;with modified independence;with least restrictive assistive device PT Goal: Up/Down Stairs - Progress: Goal set today  Visit Information  Last PT Received On: 11/10/12    Subjective Data  Subjective: N. O. No  ( re: SNF) Patient Stated Goal: to go home   Prior Functioning  Home Living Lives With: Alone Available Help at Discharge: Family;Available PRN/intermittently Type of Home: Apartment Home Access: Stairs to enter Entrance Stairs-Number of Steps: 3 Entrance  Stairs-Rails: Right;Left  Home Layout: One level Home Adaptive Equipment: Straight cane;Walker - rolling Prior Function Level of Independence: Independent with assistive device(s) Driving: Yes Communication Communication: No difficulties    Cognition  Overall Cognitive Status: Appears within functional limits for tasks assessed/performed Arousal/Alertness: Awake/alert Orientation Level: Oriented X4 / Intact Behavior During Session: Restless Cognition - Other Comments: pt needs cues to keep conversation to task.  Tends to be a little impulsive    Extremity/Trunk Assessment Right Lower Extremity Assessment RLE ROM/Strength/Tone: Deficits RLE ROM/Strength/Tone Deficits: generalized deconditioning Left Lower Extremity Assessment LLE ROM/Strength/Tone: Deficits LLE ROM/Strength/Tone Deficits: generalized deconditioning Trunk Assessment Trunk Assessment: Normal   Balance Balance Balance Assessed: Yes Static Sitting Balance Static Sitting - Balance Support: No upper extremity supported;Feet supported Static Sitting - Level of Assistance: 7: Independent Static Sitting - Comment/# of Minutes: >3 Static Standing Balance Static Standing - Balance Support: Bilateral upper extremity supported Static Standing - Level of Assistance: 6: Modified independent (Device/Increase time) Static Standing - Comment/# of Minutes: stands well with RW  End of Session PT - End of Session Equipment Utilized During Treatment: Gait belt Activity Tolerance: Patient tolerated treatment well Patient left: in chair Nurse Communication: Mobility status  GP     Bayard Hugger. Brown,PT [phone] 11/10/2012, 10:37 AM

## 2012-11-10 NOTE — Evaluation (Addendum)
Occupational Therapy Evaluation Patient Details Name: [name] MRN: [id number] DOB: 17-Feb-1935 Today's Date: 11/10/2012 Time: 7829-5621 OT Time Calculation (min): 26 min  OT Assessment / Plan / Recommendation Clinical Impression  This 78 year old female was admitted with vomiting, diarrhea, and confusion.  She has tested postive for flu.  PMH significant for OA, RA, and DM.  At baseline, pt is modified independent for adls/iadls.  Pt is currently supervision as she gets distracted.  Will follow in acute with mod I level goals.      OT Assessment  Patient needs continued OT Services    Follow Up Recommendations  Home health OT;Supervision - Intermittent (at least assist for meals and shower transfer; pt adamently refuses SNF)    Barriers to Discharge      Equipment Recommendations   (pt states she can get uncle's shower seat)    Recommendations for Other Services    Frequency  Min 2X/week    Precautions / Restrictions Precautions Precautions: Fall Restrictions Weight Bearing Restrictions: No   Pertinent Vitals/Pain No pain    ADL  Grooming: Performed;Wash/dry hands;Set up Where Assessed - Grooming: Supported sitting Upper Body Bathing: Simulated;Supervision/safety Where Assessed - Upper Body Bathing: Unsupported sitting Lower Body Bathing: Simulated;Supervision/safety Where Assessed - Lower Body Bathing: Supported sit to stand Upper Body Dressing: Simulated;Set up Where Assessed - Upper Body Dressing: Unsupported sitting Lower Body Dressing: Performed;Set up Where Assessed - Lower Body Dressing: Supported sit to stand Toilet Transfer: Buyer, retail Method: Sit to Barista:  (chair, ambulated to bathroom and back to bed) Toileting - Clothing Manipulation and Hygiene: Simulated;Modified independent Where Assessed - Toileting Clothing Manipulation and Hygiene: Sit to stand from 3-in-1 or toilet Equipment Used:  Rolling walker Transfers/Ambulation Related to ADLs: ambulated to bathroom with RW--no LOB ADL Comments: Pt applied lotion on legs.  Needed cues to complete task (other leg).  Pt was very talkative and distracted self. Also had some paraphasias (wrong word/wrong sound) but corrected self    OT Diagnosis: Cognitive deficits  OT Problem List: Decreased cognition;Decreased activity tolerance OT Treatment Interventions: Self-care/ADL training;DME and/or AE instruction;Patient/family education;Cognitive remediation/compensation   OT Goals Acute Rehab OT Goals OT Goal Formulation: With patient Time For Goal Achievement: 11/24/12 Potential to Achieve Goals: Good ADL Goals Pt Will Transfer to Toilet: with modified independence;Ambulation;Comfort height toilet ADL Goal: Toilet Transfer - Progress: Goal set today Miscellaneous OT Goals Miscellaneous OT Goal #1: Pt will gather clothes and complete adl at mod I level in bathoom (with therapist out of sight to decrease chance of distraction), sit to stand using RW to gather clothes OT Goal: Miscellaneous Goal #1 - Progress: Goal set today  Visit Information  Last OT Received On: 11/10/12 Assistance Needed: +1    Subjective Data  Subjective: I use the walker inside of the house and the cane when I go out Patient Stated Goal: home today   Prior Functioning     Home Living Lives With: Alone Available Help at Discharge: Family;Available PRN/intermittently Type of Home: Apartment Home Access: Stairs to enter Entrance Stairs-Number of Steps: 3 Entrance Stairs-Rails: Right;Left Home Layout: One level Bathroom Shower/Tub: Engineer, manufacturing systems: Handicapped height Home Adaptive Equipment: Straight cane;Walker - rolling Prior Function Level of Independence: Independent with assistive device(s) Driving: Yes Communication Communication: No difficulties         Vision/Perception     Cognition  Overall Cognitive Status:  Appears within functional limits for tasks assessed/performed Arousal/Alertness: Awake/alert Orientation Level:  Oriented X4 / Intact Behavior During Session: WFL for tasks performed Cognition - Other Comments: distracts self    Extremity/Trunk Assessment Right Upper Extremity Assessment RUE ROM/Strength/Tone: Within functional levels Left Upper Extremity Assessment LUE ROM/Strength/Tone: Within functional levels   Bed mobility: Sit to supine:  Modified independent  Mobility Transfers Sit to Stand: 6: Modified independent (Device/Increase time) Details for Transfer Assistance: pt tends to pull up on walker     Shoulder Instructions     Exercise     Balance     End of Session OT - End of Session Activity Tolerance: Patient tolerated treatment well Patient left: in bed;with call bell/phone within reach  GO     Shann Merrick 11/10/2012, 3:00 PM Marica Otter, OTR/L [phone] 11/10/2012

## 2012-11-11 ENCOUNTER — Inpatient Hospital Stay (HOSPITAL_COMMUNITY): Payer: Medicare Other

## 2012-11-11 MED ORDER — NITROFURANTOIN MACROCRYSTAL 100 MG PO CAPS: 100.0000 mg | ORAL_CAPSULE | Freq: Two times a day (BID) | ORAL | Status: AC

## 2012-11-11 MED ORDER — DOXYCYCLINE HYCLATE 100 MG PO TABS: 100.0000 mg | ORAL_TABLET | Freq: Two times a day (BID) | ORAL | Status: AC

## 2012-11-11 MED ORDER — NITROFURANTOIN MACROCRYSTAL 100 MG PO CAPS
100.0000 mg | ORAL_CAPSULE | Freq: Two times a day (BID) | ORAL | Status: DC
  Administered 2012-11-11 – 2012-11-12 (×3): 100 mg via ORAL
  Filled 2012-11-11 (×4): qty 1

## 2012-11-11 MED ORDER — OSELTAMIVIR PHOSPHATE 75 MG PO CAPS
75.0000 mg | ORAL_CAPSULE | Freq: Two times a day (BID) | ORAL | Status: DC
Start: 2012-11-11 — End: 2013-01-09

## 2012-11-11 MED ORDER — GUAIFENESIN-DM 100-10 MG/5ML PO SYRP: 5.0000 mL | ORAL_SOLUTION | Freq: Three times a day (TID) | ORAL | Status: DC | PRN

## 2012-11-11 MED ORDER — PREDNISONE (PAK) 10 MG PO TABS: 20.0000 mg | ORAL_TABLET | Freq: Every day | ORAL | Status: AC

## 2012-11-11 NOTE — Progress Notes (Signed)
Physical Therapy Treatment Patient Details Name: [name] MRN: [id number] DOB: 08-28-1935 Today's Date: 11/11/2012 Time: 4098-1191 PT Time Calculation (min): 25 min  PT Assessment / Plan / Recommendation Comments on Treatment Session  pt improving.  She continues to have some high level balance and decreased endurance. Expect she will continue to improve and be able to d/c to home after SNF rehab    Follow Up Recommendations  SNF     Does the patient have the potential to tolerate intense rehabilitation     Barriers to Discharge        Equipment Recommendations  None recommended by PT    Recommendations for Other Services    Frequency Min 3X/week   Plan Discharge plan remains appropriate    Precautions / Restrictions Precautions Precautions: Fall Restrictions Weight Bearing Restrictions: No   Pertinent Vitals/Pain No c/o pain    Mobility  Bed Mobility Bed Mobility: Rolling Right;Rolling Left;Supine to Sit Rolling Right: 6: Modified independent (Device/Increase time) Rolling Left: 6: Modified independent (Device/Increase time) Supine to Sit: 6: Modified independent (Device/Increase time);With rails Details for Bed Mobility Assistance: improved bed mobility today.  takes extra time due to problems with right knee Transfers Transfers: Sit to Stand;Stand to Sit Sit to Stand: 6: Modified independent (Device/Increase time) Stand to Sit: 6: Modified independent (Device/Increase time) Details for Transfer Assistance: repeated sit to stand 5 times for  strengthening Ambulation/Gait Ambulation/Gait Assistance: 6: Modified independent (Device/Increase time) Ambulation Distance (Feet): 10 Feet (IV pole needs to be plugged in so only walked in room) Assistive device: Straight cane Ambulation/Gait Assistance Details: pt needs some balance assist Gait Pattern: Step-through pattern Gait velocity: decreased General Gait Details: pt benefits from both hands on assistive  device.  Limited by IV in hand Stairs: No Wheelchair Mobility Wheelchair Mobility: No    Exercises General Exercises - Lower Extremity Gluteal Sets: AROM;Right;Left;5 reps;Standing Hip ABduction/ADduction: AROM;Right;10 reps;Standing;Left Hip Flexion/Marching: AROM;Right;Left;10 reps;Standing Heel Raises: AROM;Both;Right;Left;5 reps Other Exercises Other Exercises: standing balance tolerance and posture exercise   PT Diagnosis:    PT Problem List:   PT Treatment Interventions:     PT Goals Acute Rehab PT Goals PT Goal Formulation: With patient Time For Goal Achievement: 11/24/12 Potential to Achieve Goals: Good Pt will go Supine/Side to Sit: Independently PT Goal: Supine/Side to Sit - Progress: Met Pt will go Sit to Supine/Side: Independently PT Goal: Sit to Supine/Side - Progress: Met Pt will go Sit to Stand: Independently PT Goal: Sit to Stand - Progress: Met Pt will go Stand to Sit: Independently PT Goal: Stand to Sit - Progress: Met Pt will Ambulate: >150 feet;with modified independence;with least restrictive assistive device PT Goal: Ambulate - Progress: Progressing toward goal Pt will Go Up / Down Stairs: 3-5 stairs;with modified independence;with least restrictive assistive device  Visit Information  Last PT Received On: 11/11/12 Assistance Needed: +1    Subjective Data  Subjective: Dr. Jarold Motto wants me to go for rehab Patient Stated Goal: to go back to her home   Cognition  Overall Cognitive Status: Appears within functional limits for tasks assessed/performed Arousal/Alertness: Awake/alert Orientation Level: Oriented X4 / Intact Behavior During Session: [facility] for tasks performed Cognition - Other Comments: pt continues to talk about family problems    Balance  Balance Balance Assessed: Yes Static Sitting Balance Static Sitting - Balance Support: No upper extremity supported;Feet supported Static Sitting - Level of Assistance: 7: Independent Static  Standing Balance Static Standing - Balance Support: No upper extremity supported Static  Standing - Level of Assistance: 6: Modified independent (Device/Increase time) Static Standing - Comment/# of Minutes: > 5 minutes High Level Balance High Level Balance Activites: Side stepping;Backward walking High Level Balance Comments: worked on stepping and weight shifting, but pt had difficulty coordinating this and had multiple episodes of balance loss  End of Session PT - End of Session Activity Tolerance: Patient tolerated treatment well Patient left: in bed Nurse Communication: Mobility status   GP    Rosey Bath K. [place], [place] [phone]  11/11/2012, 3:54 PM

## 2012-11-11 NOTE — Progress Notes (Signed)
Clinical Social Work Department BRIEF PSYCHOSOCIAL ASSESSMENT 11/11/2012  Patient:  [name], [name]     Account Number:  [id number]     Admit date:  11/08/2012  Clinical Social Worker:  Jacelyn Grip  Date/Time:  11/11/2012 11:50 AM  Referred by:  Physician  Date Referred:  11/11/2012 Referred for  SNF Placement   Other Referral:   Interview type:  Patient Other interview type:    PSYCHOSOCIAL DATA Living Status:  ALONE Admitted from facility:   Level of care:   Primary support name:  Peyton Najjar Brubeck/son/[phone] Primary support relationship to patient:  CHILD, ADULT Degree of support available:   adequate    CURRENT CONCERNS Current Concerns  Post-Acute Placement   Other Concerns:    SOCIAL WORK ASSESSMENT / PLAN Referral placed to CSW for New SNF on 1/20, but pt had not yet been evaluated by PT. Pt evaluated by PT on 1/21 and pt refusing SNF. Per Dr. Jarold Motto note from today 1/22, MD asked pt to reconsider SNF.    CSW and RNCM met with pt at bedside to discuss. Pt did not initially want to go to SNF, but since Dr. Jarold Motto is encouraging pt to go now pt is reconsidering. Pt agreeable to SNF search in [facility].    CSW completed FL2 and initiated SNF search to [facility]. Pt is interested in [facility]. CSW notified facility. CSW to follow up with pt in regard to bed offers.   Assessment/plan status:  Psychosocial Support/Ongoing Assessment of Needs Other assessment/ plan:   discharge planning   Information/referral to community resources:   [facility] list    PATIENT'S/FAMILY'S RESPONSE TO PLAN OF CARE: Pt alert and oriented x 4. Pt interested in exploring SNF options even though initially she refused SNF.       Jacklynn Lewis, MSW, LCSWA  Clinical Social Work [phone]

## 2012-11-11 NOTE — Progress Notes (Signed)
Pt states that she do not want SNF.  Pt would like HHRN and HHPT, she selected [facility]. Will need an order for [facility], HHPT.

## 2012-11-11 NOTE — Progress Notes (Signed)
Clinical Social Work Department CLINICAL SOCIAL WORK PLACEMENT NOTE 11/11/2012  Patient:  [name], [name]  Account Number:  [id number] Admit date:  11/08/2012  Clinical Social Worker:  Jacelyn Grip  Date/time:  11/11/2012 12:00 N  Clinical Social Work is seeking post-discharge placement for this patient at the following level of care:   SKILLED NURSING   (*CSW will update this form in Epic as items are completed)   11/11/2012  Patient/family provided with [facility] Department of Clinical Social Work's list of facilities offering this level of care within the geographic area requested by the patient (or if unable, by the patient's family).  11/11/2012  Patient/family informed of their freedom to choose among providers that offer the needed level of care, that participate in Medicare, Medicaid or managed care program needed by the patient, have an available bed and are willing to accept the patient.  11/11/2012  Patient/family informed of [facility]' ownership interest in [facility], as well as of the fact that they are under no obligation to receive care at this facility.  PASARR submitted to EDS on 11/11/2012 PASARR number received from EDS on 11/11/2012  FL2 transmitted to all facilities in geographic area requested by pt/family on  11/11/2012 FL2 transmitted to all facilities within larger geographic area on   Patient informed that his/her managed care company has contracts with or will negotiate with  certain facilities, including the following:     Patient/family informed of bed offers received:   Patient chooses bed at  Physician recommends and patient chooses bed at    Patient to be transferred to  on   Patient to be transferred to facility by   The following physician request were entered in Epic:   Additional Comments:    Jacklynn Lewis, MSW, LCSWA  Clinical Social Work [phone]

## 2012-11-11 NOTE — Progress Notes (Signed)
Clinical Social Worker followed up with pt in regard to SNF bed offers in anticipation for discharge tomorrow. Pt now states that she does not want SNF. Pt would like to return home with [facility] services. RNCM notified. No further social work needs identified at this time. Clinical Social Worker signing off. Please re-consult if further social work needs arise.  Jacklynn Lewis, MSW, LCSWA  Clinical Social Work [phone]

## 2012-11-11 NOTE — Progress Notes (Signed)
Subjective: Still having cough and weakness, declines SNF rehabilitation  Objective: Vital signs in last 24 hours: Temp:  [97.3 F (36.3 C)-98.6 F (37 C)] 97.3 F (36.3 C) (01/22 0450) Pulse Rate:  [93-104] 93  (01/22 0450) Resp:  [20-22] 20  (01/22 0450) BP: (106-122)/(62-65) 106/64 mmHg (01/22 0450) SpO2:  [96 %-97 %] 96 % (01/22 0450) Weight change:    Intake/Output from previous day: 01/21 0701 - 01/22 0700 In: 2050 [I.V.:1800] Out: -    General appearance: alert, cooperative and occasional dry cough Resp: bilateral scattered wheezing Cardio: regular rate and rhythm GI: soft, non-tender; bowel sounds normal; no masses,  no organomegaly Extremities: no peripheral edema  Lab Results:  Basename 11/10/12 0400 11/09/12 0952  WBC 6.5 10.6*  HGB 14.8 13.3  HCT 43.6 39.7  PLT 143* 131*   BMET  Basename 11/10/12 0400 11/09/12 0952 11/08/12 2148  NA 139 -- 136  K 4.4 -- 3.7  CL 104 -- 101  CO2 22 -- --  GLUCOSE 152* -- 94  BUN 11 -- 11  CREATININE 0.70 0.59 --  CALCIUM 8.1* -- --   CMET CMP     Component Value Date/Time   NA 139 11/10/2012 0400   K 4.4 11/10/2012 0400   CL 104 11/10/2012 0400   CO2 22 11/10/2012 0400   GLUCOSE 152* 11/10/2012 0400   BUN 11 11/10/2012 0400   CREATININE 0.70 11/10/2012 0400   CALCIUM 8.1* 11/10/2012 0400   CALCIUM 8.6 11/15/2011 0445   PROT 6.0 11/10/2012 0400   ALBUMIN 2.9* 11/10/2012 0400   AST 19 11/10/2012 0400   ALT 12 11/10/2012 0400   ALKPHOS 45 11/10/2012 0400   BILITOT 0.2* 11/10/2012 0400   GFRNONAA 83* 11/10/2012 0400   GFRAA >90 11/10/2012 0400    CBG (last 3)   Basename 11/10/12 0731 11/09/12 2338 11/09/12 0955  GLUCAP 144* 122* 79    INR RESULTS:   Lab Results  Component Value Date   INR 0.87 09/16/2011   INR 2.6* 05/12/2008   INR 2.3* 05/11/2008     Studies/Results: No results found.  Medications: I have reviewed the patient's current medications.  Assessment/Plan: #1 Cough and Pneumonia: stable on  current meds #2 Physical deconditioning: moderately severe and I encouraged her to reconsider her decision about not going to a SNF, and assurred her that I could facilitate transfer to [name] or [place] facilities. Likely discharge tomorrow.  LOS: 3 days   Treacy Holcomb G 11/11/2012, 8:16 AM

## 2012-11-12 DIAGNOSIS — J101 Influenza due to other identified influenza virus with other respiratory manifestations: Secondary | ICD-10-CM | POA: Diagnosis present

## 2012-11-12 LAB — URINE CULTURE: Colony Count: >=100000

## 2012-11-12 NOTE — Progress Notes (Signed)
Occupational Therapy Treatment and Discharge Summary Patient Details Name: [name] MRN: [id number] DOB: October 10, 1935 Today's Date: 11/12/2012 Time: 4010-2725 OT Time Calculation (min): 30 min  OT Assessment / Plan / Recommendation Comments on Treatment Session Pt overall at mod I level  with all adls.    Follow Up Recommendations  Home health OT;Supervision - Intermittent    Barriers to Discharge       Equipment Recommendations       Recommendations for Other Services    Frequency Min 2X/week   Plan Discharge plan remains appropriate    Precautions / Restrictions Precautions Precautions: Fall Precaution Comments: generalized deconditioning Restrictions Weight Bearing Restrictions: No   Pertinent Vitals/Pain Pt w no c/o pain.    ADL  Grooming: Performed;Wash/dry hands;Supervision/safety Where Assessed - Grooming: Unsupported standing Upper Body Bathing: Performed;Set up Where Assessed - Upper Body Bathing: Unsupported sitting Lower Body Bathing: Performed;Set up Where Assessed - Lower Body Bathing: Unsupported sit to stand Upper Body Dressing: Performed;Independent Where Assessed - Upper Body Dressing: Unsupported sitting Lower Body Dressing: Performed;Independent Where Assessed - Lower Body Dressing: Supported sit to Pharmacist, hospital: Performed;Modified independent Toilet Transfer Method: Sit to Barista: Comfort height toilet;Grab bars Toileting - Clothing Manipulation and Hygiene: Performed;Modified independent Where Assessed - Toileting Clothing Manipulation and Hygiene: Sit to stand from 3-in-1 or toilet Equipment Used: Cane Transfers/Ambulation Related to ADLs: ambulated to bathroom and in hallway with cane and no LoB ADL Comments: Pt very independent today preparing to go home, dressing self, toileting and grooming.    OT Diagnosis:    OT Problem List:   OT Treatment Interventions:     OT Goals Acute Rehab OT Goals OT  Goal Formulation: With patient Time For Goal Achievement: 11/24/12 Potential to Achieve Goals: Good ADL Goals Pt Will Transfer to Toilet: with modified independence;Ambulation;Comfort height toilet ADL Goal: Toilet Transfer - Progress: Met Miscellaneous OT Goals Miscellaneous OT Goal #1: Pt will gather clothes and complete adl at mod I level in bathoom (with therapist out of sight to decrease chance of distraction), sit to stand using RW to gather clothes OT Goal: Miscellaneous Goal #1 - Progress: Met  Visit Information  Last OT Received On: 11/12/12 Assistance Needed: +1    Subjective Data      Prior Functioning       Cognition  Overall Cognitive Status: Appears within functional limits for tasks assessed/performed Arousal/Alertness: Awake/alert Orientation Level: Oriented X4 / Intact Behavior During Session: [facility] for tasks performed Cognition - Other Comments: easily distracted.  Daughter states this is baseline for her.    Mobility  Shoulder Instructions Transfers Transfers: Stand to Sit;Sit to Stand Sit to Stand: 6: Modified independent (Device/Increase time) Stand to Sit: 6: Modified independent (Device/Increase time) Details for Transfer Assistance: no assist needed.       Exercises      Balance High Level Balance High Level Balance Activites: Backward walking;Direction changes High Level Balance Comments: no lob with sidestepping or picking items from the floor.   End of Session OT - End of Session Activity Tolerance: Patient tolerated treatment well Patient left: in chair;with call bell/phone within reach;with family/visitor present Nurse Communication: Mobility status  GO     Hope Budds 11/12/2012, 1:00 PM [phone]

## 2012-11-12 NOTE — Progress Notes (Signed)
Paged MD on call regarding urine culture which showed >100,000 colonies E-Coli and confirmed ESBL.  Informed Dr. Evlyn Kanner that patient was being discharged today and he stated to let the MD take care of this prior to discharge.  Will continue to monitor.  Fatima Sanger A

## 2012-11-12 NOTE — Discharge Summary (Signed)
Physician Discharge Summary  Patient ID: [name] MRN: [id number] DOB/AGE: 76-May-1936 76 y.o.  Admit date: 11/08/2012 Discharge date: 11/12/2012   Discharge Diagnoses:  Principal Problem:  *Pneumonia Active Problems:  UTI (lower urinary tract infection)  Low back pain  Influenza A  Failure to thrive in adult  Weakness generalized  Type II or unspecified type diabetes mellitus without mention of complication, not stated as uncontrolled  Adrenal insufficiency, primary, iatrogenic  Gait instability  Rheumatoid arthritis  Pancreatic insufficiency   Discharged Condition: good  Hospital Course: The patient is a 78 year old woman with multiple medical problems including rheumatoid arthritis that is prednisone dependent, physical deconditioning, diabetes mellitus, type II and osteoporosis. She was brought to the emergency department after her neighbors noted that she was having vomiting, diarrhea, and confusion. She also complained of nasal congestion and cough with decreased ability to ambulate with her walker or cane. In the emergency room she had x-ray evidence of possible pneumonia as well as a urinary tract infection. She was admitted and started on antibiotics for this. Initially, she was somewhat lethargic and this improved with IV fluids and antibiotics. She was also treated with stress dose corticosteroids. A nasal smear returned positive for influenza type A and she was started on Tamiflu for this. Her urine culture grew out Escherichia coli with extended spectrum resistance that was sensitive to Macrobid that she was started on. Procedures included a CT scan of the head without IV contrast that showed no acute abnormalities. There are no complications of her hospitalization. She was seen by physical therapy and occupational therapy personnel who noted that she was physically deconditioned, but was able to independently ambulate and perform activities of daily living. With  reassurance that family members would be able to check in on her on a regular basis, she will be discharged to home to complete treatment for influenza type A and a urinary tract infection. On the day of discharge she was eating well without problems of abdominal pain or significant diarrhea. She continues to have a dry cough without shortness of breath, and her mentation is normal. She is able to ambulate with a walker independently.  Consults: None  Significant Diagnostic Studies:  Dg Chest 2 View  11/11/2012  *RADIOLOGY REPORT*  Clinical Data: Weakness and cough.  CHEST - 2 VIEW  Comparison: 11/08/2012.  Findings: Trachea is midline.  Heart size stable.  Thoracic aorta is calcified.  Minimal biapical pleural parenchymal scarring. Slight volume loss at the left lung base.  Possible air space disease in the medial right lung base.  Question tiny bilateral pleural effusions.  Old right rib fracture.  IMPRESSION:  1.  Question developing air space disease at the medial right lung base. 2.  Mild volume loss at the left lung base, likely chronic. 3.  Suspect tiny bilateral pleural effusions.   Original Report Authenticated By: [name], M.D.     Labs: Lab Results  Component Value Date   WBC 6.5 11/10/2012   HGB 14.8 11/10/2012   HCT 43.6 11/10/2012   MCV 94.4 11/10/2012   PLT 143* 11/10/2012     Lab 11/10/12 0400  NA 139  K 4.4  CL 104  CO2 22  BUN 11  CREATININE 0.70  CALCIUM 8.1*  PROT 6.0  BILITOT 0.2*  ALKPHOS 45  ALT 12  AST 19  GLUCOSE 152*       Lab Results  Component Value Date   INR 0.87 09/16/2011   INR 2.6* 05/12/2008  INR 2.3* 05/11/2008     Recent Results (from the past 240 hour(s))  URINE CULTURE     Status: Normal   Collection Time   11/08/12  9:53 PM      Component Value Range Status Comment   Specimen Description URINE, RANDOM   Final    Special Requests NONE   Final    Culture  Setup Time 11/09/2012 11:47   Final    Colony Count >=100,000 COLONIES/ML    Final    Culture     Final    Value: ESCHERICHIA COLI     Note: Confirmed Extended Spectrum Beta-Lactamase Producer (ESBL) CRITICAL RESULT CALLED TO, READ BACK BY AND VERIFIED WITH: STEVIE FOUST @ 0431 ON 11/12/2012  HAJAM   Report Status 11/12/2012 FINAL   Final    Organism ID, Bacteria ESCHERICHIA COLI   Final       Discharge Exam: Blood pressure 125/69, pulse 96, temperature 97.7 F (36.5 C), temperature source Oral, resp. rate 20, height 5\' 5"  (1.651 m), weight 67.2 kg (148 lb 2.4 oz), SpO2 93.00%.  Physical Exam: In general, she is an elderly white woman who had an occasional dry cough without shortness of breath. HEENT exam was significant for moderate rhinitis, chest was clear to auscultation, heart had a regular rate and rhythm without significant murmur, abdomen had normal bowel sounds no tenderness, she had bilateral trace ankle edema. She was alert and well-oriented with normal affect and able to move all extremities well.   Disposition: She'll be discharged to home today with the company of family members. It is advised that she wear a face mask when family members are present to avoid giving them the flu. She should call our office later today to setup a followup visit within one week of discharge from the hospital. She should continue to use her walker at all times to ambulate safely.  Discharge Orders    Future Orders Please Complete By Expires   Diet - low sodium heart healthy      Increase activity slowly      Discharge instructions      Comments:   Take all medications as prescribed. Use your walker at all times to aid ambulation so that you do not fall.   Call MD for:      Comments:   Fever, chills, difficulty breathing, severe diarrhea, or any other concerning symptoms       Medication List     As of 11/12/2012  7:54 AM    STOP taking these medications         predniSONE 5 MG tablet   Commonly known as: DELTASONE      TAKE these medications          alendronate 70 MG tablet   Commonly known as: FOSAMAX   Take 70 mg by mouth every 7 (seven) days. Take with a full glass of water on an empty stomach.[name] on sundays per patient.      ALPRAZolam 1 MG tablet   Commonly known as: XANAX   Take 1 mg by mouth 3 (three) times daily.      amitriptyline 25 MG tablet   Commonly known as: ELAVIL   Take 50 mg by mouth at bedtime.      buPROPion 75 MG tablet   Commonly known as: WELLBUTRIN   Take 75 mg by mouth every morning.      doxycycline 100 MG tablet   Commonly known as: VIBRA-TABS  Take 1 tablet (100 mg total) by mouth 2 (two) times daily.      guaiFENesin-dextromethorphan 100-10 MG/5ML syrup   Commonly known as: ROBITUSSIN DM   Take 5 mLs by mouth 3 (three) times daily as needed for cough.      hydroxypropyl methylcellulose 2.5 % ophthalmic solution   Commonly known as: ISOPTO TEARS   Place 1 drop into both eyes as needed. For dry eyes      lipase/protease/amylase 56213 UNITS Cpep   Commonly known as: CREON-10/PANCREASE   Take 1 capsule by mouth 3 (three) times daily before meals.      mirtazapine 30 MG tablet   Commonly known as: REMERON   Take 30 mg by mouth at bedtime.      nitrofurantoin 100 MG capsule   Commonly known as: MACRODANTIN   Take 1 capsule (100 mg total) by mouth every 12 (twelve) hours.      oseltamivir 75 MG capsule   Commonly known as: TAMIFLU   Take 1 capsule (75 mg total) by mouth 2 (two) times daily.      predniSONE 10 MG tablet   Commonly known as: STERAPRED UNI-PAK   Take 2 tablets (20 mg total) by mouth daily. Take 2 tablets by mouth every AM for 9 days, then back to prednisone 15 mg daily      sulfaSALAzine 500 MG tablet   Commonly known as: AZULFIDINE   Take 500 mg by mouth 2 (two) times daily.      Vitamin D (Ergocalciferol) 50000 UNITS Caps   Commonly known as: DRISDOL   Take 50,000 Units by mouth every 7 (seven) days. Takes every Sunday per patient.           Follow-up  Information    Follow up with [name], MD. Schedule an appointment as soon as possible for a visit in 1 week.   Contact information:   [address] [facility] [place] [address] [phone]          Signed: [name] 11/12/2012, 7:54 AM

## 2012-11-12 NOTE — Progress Notes (Signed)
[facility] lab called and stated pts urine culture showed >100,000 colonies of confirmed ESBL. Pt currently on contact isolation for hx of ESBL.  Leonor Liv ]

## 2013-01-09 ENCOUNTER — Encounter (HOSPITAL_COMMUNITY): Payer: Self-pay | Admitting: *Deleted

## 2013-01-09 ENCOUNTER — Emergency Department (HOSPITAL_COMMUNITY)
Admission: EM | Admit: 2013-01-09 | Discharge: 2013-01-10 | Disposition: A | Discharge status: Home / Self Care | Payer: Medicare Other | Attending: Emergency Medicine | Admitting: Emergency Medicine

## 2013-01-09 DIAGNOSIS — Z79899 Other long term (current) drug therapy: Secondary | ICD-10-CM | POA: Insufficient documentation

## 2013-01-09 DIAGNOSIS — Z8739 Personal history of other diseases of the musculoskeletal system and connective tissue: Secondary | ICD-10-CM | POA: Insufficient documentation

## 2013-01-09 DIAGNOSIS — R197 Diarrhea, unspecified: Secondary | ICD-10-CM | POA: Insufficient documentation

## 2013-01-09 DIAGNOSIS — R112 Nausea with vomiting, unspecified: Secondary | ICD-10-CM

## 2013-01-09 DIAGNOSIS — E119 Type 2 diabetes mellitus without complications: Secondary | ICD-10-CM | POA: Insufficient documentation

## 2013-01-09 DIAGNOSIS — Z87448 Personal history of other diseases of urinary system: Secondary | ICD-10-CM | POA: Insufficient documentation

## 2013-01-09 DIAGNOSIS — R4182 Altered mental status, unspecified: Secondary | ICD-10-CM | POA: Insufficient documentation

## 2013-01-09 DIAGNOSIS — N390 Urinary tract infection, site not specified: Secondary | ICD-10-CM | POA: Insufficient documentation

## 2013-01-09 DIAGNOSIS — M810 Age-related osteoporosis without current pathological fracture: Secondary | ICD-10-CM | POA: Insufficient documentation

## 2013-01-09 DIAGNOSIS — Z87440 Personal history of urinary (tract) infections: Secondary | ICD-10-CM | POA: Insufficient documentation

## 2013-01-09 DIAGNOSIS — R1031 Right lower quadrant pain: Secondary | ICD-10-CM | POA: Insufficient documentation

## 2013-01-09 DIAGNOSIS — Z8659 Personal history of other mental and behavioral disorders: Secondary | ICD-10-CM | POA: Insufficient documentation

## 2013-01-09 DIAGNOSIS — Z8679 Personal history of other diseases of the circulatory system: Secondary | ICD-10-CM | POA: Insufficient documentation

## 2013-01-09 DIAGNOSIS — Z8701 Personal history of pneumonia (recurrent): Secondary | ICD-10-CM | POA: Insufficient documentation

## 2013-01-09 NOTE — ED Provider Notes (Signed)
History     CSN: [id number]  Arrival date & time 01/09/13  2304   None     Chief Complaint  Patient presents with  . Emesis  . Diarrhea  . Altered Mental Status    (Consider location/radiation/quality/duration/timing/severity/associated sxs/prior treatment)  HPI [name] is a 78 y.o. female presents with right lower abdominal pain, moderate, non-radiating constant pressure, and occasional confusion with some frequency and nausea and vomiting and diarrhea.  PT has a h/o UTI and says she feels like she has a UTI. No fever chills, chest pain or dyspnea.  Past Medical History  Diagnosis Date  . Osteoporosis   . Rheumatoid arthritis   . Murmur, cardiac   . Diabetes mellitus   . Pneumonia   . Anxiety   . Adrenal insufficiency, primary, iatrogenic   . Gait instability   . Physical deconditioning     Past Surgical History  Procedure Laterality Date  . Knee arthroscopy      2 on left  . Cholecystectomy    . Thyroidectomy    . Abdominal hysterectomy      History reviewed. No pertinent family history.  History  Substance Use Topics  . Smoking status: Never Smoker   . Smokeless tobacco: Never Used  . Alcohol Use: No    OB History   Grav Para Term Preterm Abortions TAB SAB Ect Mult Living                  Review of Systems At least 10pt or greater review of systems completed and are negative except where specified in the HPI.  Allergies  Penicillins  Home Medications   Current Outpatient Rx  Name  Route  Sig  Dispense  Refill  . alendronate (FOSAMAX) 70 MG tablet   Oral   Take 70 mg by mouth every 7 (seven) days. Take with a full glass of water on an empty stomach.[name] on sundays per patient.          . ALPRAZolam (XANAX) 1 MG tablet   Oral   Take 1 mg by mouth 3 (three) times daily.          Marland Kitchen amitriptyline (ELAVIL) 25 MG tablet   Oral   Take 50 mg by mouth at bedtime.          Marland Kitchen buPROPion (WELLBUTRIN) 75 MG tablet   Oral   Take 75  mg by mouth every morning.          Marland Kitchen guaiFENesin-dextromethorphan (ROBITUSSIN DM) 100-10 MG/5ML syrup   Oral   Take 5 mLs by mouth 3 (three) times daily as needed for cough.   118 mL   0   . hydroxypropyl methylcellulose (ISOPTO TEARS) 2.5 % ophthalmic solution   Both Eyes   Place 1 drop into both eyes as needed. For dry eyes          . lipase/protease/amylase (CREON-10/PANCREASE) 12000 UNITS CPEP   Oral   Take 1 capsule by mouth 3 (three) times daily before meals.   270 capsule   1   . mirtazapine (REMERON) 30 MG tablet   Oral   Take 30 mg by mouth at bedtime.           Marland Kitchen oseltamivir (TAMIFLU) 75 MG capsule   Oral   Take 1 capsule (75 mg total) by mouth 2 (two) times daily.   10 capsule   0   . sulfaSALAzine (AZULFIDINE) 500 MG tablet   Oral  Take 500 mg by mouth 2 (two) times daily.          . Vitamin D, Ergocalciferol, (DRISDOL) 50000 UNITS CAPS   Oral   Take 50,000 Units by mouth every 7 (seven) days. Takes every Sunday per patient.            BP 125/85  Pulse 111  Temp(Src) 98.3 F (36.8 C) (Oral)  Resp 20  Wt 145 lb (65.772 kg)  BMI 24.13 kg/m2  SpO2 96%  Physical Exam  Nursing notes reviewed.  Electronic medical record reviewed. VITAL SIGNS:   Filed Vitals:   01/09/13 2317 01/10/13 0017 01/10/13 0233 01/10/13 0448  BP: 125/85 107/92 123/72 113/88  Pulse: 111   106  Temp: 98.3 F (36.8 C) 98.9 F (37.2 C) 98.9 F (37.2 C)   TempSrc: Oral Oral Oral   Resp: 20 21 26 15   Weight: 145 lb (65.772 kg)     SpO2: 96% 95% 100% 96%   CONSTITUTIONAL: Awake, orientedx4, appears non-toxic HENT: Atraumatic, normocephalic, oral mucosa pink and moist, airway patent. Nares patent without drainage. External ears normal. EYES: Conjunctiva clear, EOMI, PERRLA NECK: Trachea midline, non-tender, supple CARDIOVASCULAR: Normal heart rate, Normal rhythm, 2/6 systolic murmur loudest at the RUSB. No rubs, gallops PULMONARY/CHEST: Clear to auscultation, no  rhonchi, wheezes, or rales. Symmetrical breath sounds. Non-tender. ABDOMINAL: Non-distended, soft, non-tender - no rebound or guarding.  BS normal. NEUROLOGIC: Non-focal, moving all four extremities, no gross sensory or motor deficits. EXTREMITIES: No clubbing, cyanosis, or edema SKIN: Warm, Dry, No erythema, No rash  Tachy, regular, 2/6 RUSB murmur ED Course  Procedures (including critical care time)  Date: 01/10/2013  Rate: 103  Rhythm: sinus tachycardia  QRS Axis: normal  Intervals: normal  ST/T Wave abnormalities: diffuse ST depression (II, III, aVF, V4, V5, V6) ST elevation in aVR - no change from prior ECG  Conduction Disutrbances: IVCD  Narrative Interpretation: LVH, IVCD; ECG appears same as prior ECG including ST depression, LVH and IVCD - suspect repol abnormailty secondary to LVH     Labs Reviewed  COMPREHENSIVE METABOLIC PANEL - Abnormal; Notable for the following:    Potassium 3.3 (*)    Calcium 8.2 (*)    Albumin 3.3 (*)    GFR calc non Af Amer 85 (*)    All other components within normal limits  URINALYSIS, ROUTINE W REFLEX MICROSCOPIC - Abnormal; Notable for the following:    Color, Urine AMBER (*)    APPearance CLOUDY (*)    Hgb urine dipstick TRACE (*)    Bilirubin Urine SMALL (*)    Ketones, ur >80 (*)    Protein, ur 30 (*)    Nitrite POSITIVE (*)    Leukocytes, UA MODERATE (*)    All other components within normal limits  URINE MICROSCOPIC-ADD ON - Abnormal; Notable for the following:    Bacteria, UA MANY (*)    All other components within normal limits  URINE CULTURE  CBC  POCT I-STAT TROPONIN I   No results found.   1. UTI (lower urinary tract infection)   2. Nausea & vomiting   3. Diarrhea     MDM  [name] is a 78 y.o. female w/UTI on UA, possible concomitant AGE.  Doubt other intrabdominal illness. Pt AOx4 - She's a bit slower than usual, but not septic. PT has h/o sinus tachycardia, no change on EKG.  BP stable, afebrile,  non-toxic.  WBC count normal. Doubt pyelonephritis.  Chart review shows prior  Urine Cx repeatedly E coli susceptible to macrobid, diffuse other resistances. F/U with Dr. Eloise [name] Monday.  Niece staying with pt.  Pt safe for DC and OK with OP treatment.    I explained the diagnosis and have given explicit precautions to return to the ER including any other new or worsening symptoms. The patient understands and accepts the medical plan as it's been dictated and I have answered their questions. Discharge instructions concerning home care and prescriptions have been given.  The patient is STABLE and is discharged to home in good condition.         Jones Skene, MD 01/10/13 [phone]

## 2013-01-10 LAB — URINALYSIS, ROUTINE W REFLEX MICROSCOPIC
Glucose, UA: NEGATIVE mg/dL
Specific Gravity, Urine: 1.028 (ref 1.005–1.030)
Urobilinogen, UA: 1 mg/dL (ref 0.0–1.0)

## 2013-01-10 LAB — CBC
Platelets: 172 10*3/uL (ref 150–400)
RBC: 4.62 MIL/uL (ref 3.87–5.11)
RDW: 13.7 % (ref 11.5–15.5)
WBC: 9.1 10*3/uL (ref 4.0–10.5)

## 2013-01-10 LAB — COMPREHENSIVE METABOLIC PANEL
ALT: 11 U/L (ref 0–35)
AST: 24 U/L (ref 0–37)
Albumin: 3.3 g/dL — ABNORMAL LOW (ref 3.5–5.2)
CO2: 26 mEq/L (ref 19–32)
Calcium: 8.2 mg/dL — ABNORMAL LOW (ref 8.4–10.5)
Chloride: 96 mEq/L (ref 96–112)
GFR calc non Af Amer: 85 mL/min — ABNORMAL LOW (ref >90)
Sodium: 135 mEq/L (ref 135–145)
Total Bilirubin: 0.6 mg/dL (ref 0.3–1.2)

## 2013-01-10 LAB — POCT I-STAT TROPONIN I

## 2013-01-10 LAB — URINE MICROSCOPIC-ADD ON

## 2013-01-10 MED ORDER — ONDANSETRON 4 MG PO TBDP: 4.0000 mg | ORAL_TABLET | Freq: Four times a day (QID) | ORAL | Status: DC | PRN

## 2013-01-10 MED ORDER — NITROFURANTOIN MONOHYD MACRO 100 MG PO CAPS
100.0000 mg | ORAL_CAPSULE | Freq: Once | ORAL | Status: AC
  Administered 2013-01-10: 100 mg via ORAL
  Filled 2013-01-10: qty 1

## 2013-01-10 MED ORDER — ONDANSETRON HCL 4 MG/2ML IJ SOLN
4.0000 mg | Freq: Once | INTRAMUSCULAR | Status: AC
  Administered 2013-01-10: 4 mg via INTRAVENOUS
  Filled 2013-01-10: qty 2

## 2013-01-10 MED ORDER — NITROFURANTOIN MONOHYD MACRO 100 MG PO CAPS: 100.0000 mg | ORAL_CAPSULE | Freq: Two times a day (BID) | ORAL | Status: DC

## 2013-01-10 MED ORDER — SODIUM CHLORIDE 0.9 % IV BOLUS (SEPSIS)
500.0000 mL | Freq: Once | INTRAVENOUS | Status: AC
  Administered 2013-01-10: 500 mL via INTRAVENOUS

## 2013-01-12 LAB — URINE CULTURE

## 2013-01-13 ENCOUNTER — Telehealth (HOSPITAL_COMMUNITY): Payer: Self-pay | Admitting: Emergency Medicine

## 2013-01-13 NOTE — ED Notes (Signed)
Results received from [facility].  (+) URNC  Rx given in ED for Nitrofurantoin -> sensitive to the same.  Chart appended per protocol. 

## 2013-02-09 ENCOUNTER — Encounter (HOSPITAL_COMMUNITY): Payer: Self-pay

## 2013-02-09 ENCOUNTER — Emergency Department (HOSPITAL_COMMUNITY): Payer: Medicare Other

## 2013-02-09 ENCOUNTER — Emergency Department (HOSPITAL_COMMUNITY)
Admission: EM | Admit: 2013-02-09 | Discharge: 2013-02-09 | Disposition: A | Discharge status: Home / Self Care | Payer: Medicare Other | Attending: Emergency Medicine | Admitting: Emergency Medicine

## 2013-02-09 DIAGNOSIS — Y9389 Activity, other specified: Secondary | ICD-10-CM | POA: Insufficient documentation

## 2013-02-09 DIAGNOSIS — S0990XA Unspecified injury of head, initial encounter: Secondary | ICD-10-CM | POA: Insufficient documentation

## 2013-02-09 DIAGNOSIS — Z8701 Personal history of pneumonia (recurrent): Secondary | ICD-10-CM | POA: Insufficient documentation

## 2013-02-09 DIAGNOSIS — Z862 Personal history of diseases of the blood and blood-forming organs and certain disorders involving the immune mechanism: Secondary | ICD-10-CM | POA: Insufficient documentation

## 2013-02-09 DIAGNOSIS — N390 Urinary tract infection, site not specified: Secondary | ICD-10-CM | POA: Insufficient documentation

## 2013-02-09 DIAGNOSIS — S0101XA Laceration without foreign body of scalp, initial encounter: Secondary | ICD-10-CM

## 2013-02-09 DIAGNOSIS — R011 Cardiac murmur, unspecified: Secondary | ICD-10-CM | POA: Insufficient documentation

## 2013-02-09 DIAGNOSIS — Z79899 Other long term (current) drug therapy: Secondary | ICD-10-CM | POA: Insufficient documentation

## 2013-02-09 DIAGNOSIS — W19XXXA Unspecified fall, initial encounter: Secondary | ICD-10-CM

## 2013-02-09 DIAGNOSIS — E119 Type 2 diabetes mellitus without complications: Secondary | ICD-10-CM | POA: Insufficient documentation

## 2013-02-09 DIAGNOSIS — Z8639 Personal history of other endocrine, nutritional and metabolic disease: Secondary | ICD-10-CM | POA: Insufficient documentation

## 2013-02-09 DIAGNOSIS — Y9229 Other specified public building as the place of occurrence of the external cause: Secondary | ICD-10-CM | POA: Insufficient documentation

## 2013-02-09 DIAGNOSIS — Z8739 Personal history of other diseases of the musculoskeletal system and connective tissue: Secondary | ICD-10-CM | POA: Insufficient documentation

## 2013-02-09 DIAGNOSIS — W1809XA Striking against other object with subsequent fall, initial encounter: Secondary | ICD-10-CM | POA: Insufficient documentation

## 2013-02-09 DIAGNOSIS — F411 Generalized anxiety disorder: Secondary | ICD-10-CM | POA: Insufficient documentation

## 2013-02-09 DIAGNOSIS — S0100XA Unspecified open wound of scalp, initial encounter: Secondary | ICD-10-CM | POA: Insufficient documentation

## 2013-02-09 LAB — URINALYSIS, ROUTINE W REFLEX MICROSCOPIC
Hgb urine dipstick: NEGATIVE
Nitrite: POSITIVE — AB
Specific Gravity, Urine: 1.017 (ref 1.005–1.030)
Urobilinogen, UA: 1 mg/dL (ref 0.0–1.0)
pH: 7 (ref 5.0–8.0)

## 2013-02-09 LAB — URINE MICROSCOPIC-ADD ON

## 2013-02-09 MED ORDER — TETANUS-DIPHTHERIA TOXOIDS TD 5-2 LFU IM INJ
0.5000 mL | INJECTION | Freq: Once | INTRAMUSCULAR | Status: AC
  Administered 2013-02-09: 0.5 mL via INTRAMUSCULAR
  Filled 2013-02-09: qty 0.5

## 2013-02-09 MED ORDER — CEPHALEXIN 250 MG PO CAPS: 250.0000 mg | ORAL_CAPSULE | Freq: Four times a day (QID) | ORAL | Status: DC

## 2013-02-09 NOTE — ED Provider Notes (Signed)
LACERATION REPAIR Performed by: Lottie Mussel Authorized by: Jaynie Crumble A Consent: Verbal consent obtained. Risks and benefits: risks, benefits and alternatives were discussed Consent given by: patient Patient identity confirmed: provided demographic data Prepped and Draped in normal sterile fashion Wound explored  Laceration Location: right scalp  Laceration Length: 3cm  No Foreign Bodies seen or palpated  Anesthesia: local infiltration  Local anesthetic: lidocaine 2% wo epinephrine  Anesthetic total: 3 ml  Irrigation method: syringe Amount of cleaning: standard  Skin closure: staples  Number of sutures: 4  Patient tolerance: Patient tolerated the procedure well with no immediate complications.   Lottie Mussel, PA-C 02/09/13 1453

## 2013-02-09 NOTE — ED Provider Notes (Signed)
History     CSN: [id number]  Arrival date & time 02/09/13  1124   First MD Initiated Contact with Patient 02/09/13 1133      Chief Complaint  Patient presents with  . Fall    (Consider location/radiation/quality/duration/timing/severity/associated sxs/prior treatment) HPI Comments: [name] is a 78 y.o. Female who was sitting down to eat at a restaurant booth, when she fell and struck the back of her head on a table. She feels that it is her usual balance problem that caused her to fall. She typically uses either a cane or walker to help herself ambulate. She presents by EMS for evaluation. There was no loss of consciousness. She has no dizziness, nausea, vomiting, headache, neck pain, back pain, or extremity discomfort. She was well earlier today and has had no recent symptoms or illnesses. There are no known modifying factors.  Patient is a 78 y.o. female presenting with fall. The history is provided by the patient.  Fall    Past Medical History  Diagnosis Date  . Osteoporosis   . Rheumatoid arthritis   . Murmur, cardiac   . Diabetes mellitus   . Pneumonia   . Anxiety   . Adrenal insufficiency, primary, iatrogenic   . Gait instability   . Physical deconditioning     Past Surgical History  Procedure Laterality Date  . Knee arthroscopy      2 on left  . Cholecystectomy    . Thyroidectomy    . Abdominal hysterectomy      History reviewed. No pertinent family history.  History  Substance Use Topics  . Smoking status: Never Smoker   . Smokeless tobacco: Never Used  . Alcohol Use: No    OB History   Grav Para Term Preterm Abortions TAB SAB Ect Mult Living                  Review of Systems  All other systems reviewed and are negative.    Allergies  Penicillins  Home Medications   Current Outpatient Rx  Name  Route  Sig  Dispense  Refill  . alendronate (FOSAMAX) 70 MG tablet   Oral   Take 70 mg by mouth every 7 (seven) days. Take with a full  glass of water on an empty stomach.[name] on sundays per patient.         . ALPRAZolam (XANAX) 1 MG tablet   Oral   Take 1 mg by mouth 3 (three) times daily as needed for anxiety.          Marland Kitchen amitriptyline (ELAVIL) 25 MG tablet   Oral   Take 75 mg by mouth at bedtime.          Marland Kitchen buPROPion (WELLBUTRIN) 75 MG tablet   Oral   Take 75 mg by mouth every morning.          Marland Kitchen HYDROcodone-acetaminophen (NORCO/VICODIN) 5-325 MG per tablet   Oral   Take 1-2 tablets by mouth every 8 (eight) hours as needed for pain.         . hydroxypropyl methylcellulose (ISOPTO TEARS) 2.5 % ophthalmic solution   Both Eyes   Place 1 drop into both eyes as needed. For dry eyes         . lipase/protease/amylase (CREON-10/PANCREASE) 12000 UNITS CPEP   Oral   Take 1 capsule by mouth 3 (three) times daily before meals.   270 capsule   1   . loperamide (IMODIUM) 2 MG capsule  Oral   Take 2 mg by mouth 4 (four) times daily as needed for diarrhea or loose stools.         . mirtazapine (REMERON) 30 MG tablet   Oral   Take 30 mg by mouth at bedtime.          . ondansetron (ZOFRAN-ODT) 4 MG disintegrating tablet   Oral   Take 4 mg by mouth every 6 (six) hours as needed for nausea.         . prednisoLONE 5 MG TABS   Oral   Take 15 mg by mouth daily.         . Vitamin D, Ergocalciferol, (DRISDOL) 50000 UNITS CAPS   Oral   Take 50,000 Units by mouth every 7 (seven) days. Takes every Sunday per patient.          . cephALEXin (KEFLEX) 250 MG capsule   Oral   Take 1 capsule (250 mg total) by mouth 4 (four) times daily.   28 capsule   0     BP 139/91  Pulse 100  Temp(Src) 98.3 F (36.8 C) (Oral)  Resp 16  SpO2 95%  Physical Exam  Nursing note and vitals reviewed. Constitutional: She is oriented to person, place, and time. She appears well-developed.  Elderly, frail  HENT:  Head: Normocephalic and atraumatic.  Eyes: Conjunctivae and EOM are normal. Pupils are equal,  round, and reactive to light.  Neck: Normal range of motion and phonation normal. Neck supple.  Cardiovascular: Normal rate, regular rhythm and intact distal pulses.   Pulmonary/Chest: Effort normal and breath sounds normal. She exhibits no tenderness.  Abdominal: Soft. She exhibits no distension. There is no tenderness. There is no guarding.  Musculoskeletal: Normal range of motion.  No midline tenderness of cervical spine. No distracting injury. NEXUS negative.  Neurological: She is alert and oriented to person, place, and time. She has normal strength. She exhibits normal muscle tone.  Skin: Skin is warm and dry.  Psychiatric: She has a normal mood and affect. Her behavior is normal. Judgment and thought content normal.    ED Course  Procedures (including critical care time)   Medications  tetanus & diphtheria toxoids (adult) (TENIVAC) injection 0.5 mL (0.5 mLs Intramuscular Given 02/09/13 1242)   Scalp wound repaired by, NPP.  Reeval: 14:50- no further complaints   Labs Reviewed  URINALYSIS, ROUTINE W REFLEX MICROSCOPIC - Abnormal; Notable for the following:    APPearance CLOUDY (*)    Nitrite POSITIVE (*)    Leukocytes, UA MODERATE (*)    All other components within normal limits  URINE MICROSCOPIC-ADD ON - Abnormal; Notable for the following:    Squamous Epithelial / LPF FEW (*)    Bacteria, UA MANY (*)    All other components within normal limits  URINE CULTURE   Ct Head Wo Contrast  02/09/2013  *RADIOLOGY REPORT*  Clinical Data: Fall.  Trauma to back of head.  Laceration. Hematoma.  Occipital pain.  CT HEAD WITHOUT CONTRAST  Technique:  Contiguous axial images were obtained from the base of the skull through the vertex without contrast.  Comparison: None.  Findings: No acute cortical infarct, hemorrhage, mass lesion is present.  Atrophy and white matter disease is similar to the prior study.  The ventricles are of normal size.  No significant extra- axial fluid collection  is present.  A prominent right parietal scalp hematoma and laceration is evident.  There is no underlying fracture.  No foreign body is  evident.  IMPRESSION:  1.  Prominent right parietal scalp laceration and hematoma near the vertex without an underlying fracture. 2.  Stable atrophy and white matter disease. 3.  No acute intracranial abnormality.   Original Report Authenticated By: Marin Roberts, M.D.    Nursing Notes Reviewed/ Care Coordinated, and agree without changes. Applicable Imaging Reviewed.  Interpretation of Laboratory Data incorporated into ED treatment  1. Fall, initial encounter   2. Injury, head, initial encounter   3. Laceration of scalp, initial encounter   4. UTI (lower urinary tract infection)       MDM  Apparent mechanical fall, with head injury and contusion with scalp laceration. Significant intracranial abnormality. Incidental UTI, may have contributed to weakness, causing fall. Doubt metabolic instability, serious bacterial infection or impending vascular collapse; the patient is stable for discharge.      Plan: Home Medications- Keflex; Home Treatments- rest, fluids; Recommended follow up- PCP 10 days     Flint Melter, MD 02/09/13 1704

## 2013-02-09 NOTE — ED Provider Notes (Signed)
Medical screening examination/treatment/procedure(s) were conducted as a shared visit with non-physician practitioner(s) and myself.  I personally evaluated the patient during the encounter  See my note for comprehensive H&P  Flint Melter, MD 02/09/13 [phone]

## 2013-02-09 NOTE — ED Notes (Signed)
Per EMS - pt was eating at restaurant and tripped and fell. Pt hit the back of her head with corner of table. Laceration present in right occipital area. No active bleeding. Pt did not lose consciousness.

## 2013-02-11 LAB — URINE CULTURE

## 2013-02-12 ENCOUNTER — Telehealth (HOSPITAL_COMMUNITY): Payer: Self-pay | Admitting: Emergency Medicine

## 2013-02-12 NOTE — ED Notes (Signed)
+  Urine. Patient given Keflex. Resistant. Chart sent to EDP office for review. °

## 2013-02-12 NOTE — ED Notes (Signed)
Patient has +Urine culture. °

## 2013-02-15 NOTE — ED Notes (Signed)
Chart returned from EDP office with orders written for  Macrobid 100 #14 1 po BID by Ebbie Ridge

## 2013-02-16 ENCOUNTER — Telehealth (HOSPITAL_COMMUNITY): Payer: Self-pay | Admitting: Emergency Medicine

## 2013-02-16 NOTE — ED Notes (Signed)
LVM requesting call back.

## 2013-02-17 NOTE — ED Notes (Addendum)
Copy of labs faxed to Dr Jarome Matin -per request of patient.[facility]

## 2013-02-17 NOTE — ED Notes (Signed)
LVM message for patient to return call @ [phone]

## 2013-02-22 ENCOUNTER — Other Ambulatory Visit: Payer: Self-pay | Admitting: Internal Medicine

## 2013-02-22 ENCOUNTER — Other Ambulatory Visit (HOSPITAL_COMMUNITY): Payer: Self-pay | Admitting: Internal Medicine

## 2013-02-22 ENCOUNTER — Ambulatory Visit
Admission: RE | Admit: 2013-02-22 | Discharge: 2013-02-22 | Disposition: A | Discharge status: Home / Self Care | Payer: Medicare Other | Source: Ambulatory Visit | Attending: Internal Medicine | Admitting: Internal Medicine

## 2013-02-22 DIAGNOSIS — R0600 Dyspnea, unspecified: Secondary | ICD-10-CM

## 2013-02-22 DIAGNOSIS — M069 Rheumatoid arthritis, unspecified: Secondary | ICD-10-CM

## 2013-02-22 LAB — COMPREHENSIVE METABOLIC PANEL
Albumin: 4 g/dL (ref 3.5–5.2)
BUN: 12 mg/dL (ref 6–23)
CO2: 32 mEq/L (ref 19–32)
Calcium: 9.4 mg/dL (ref 8.4–10.5)
Chloride: 102 mEq/L (ref 96–112)
Glucose, Bld: 104 mg/dL — ABNORMAL HIGH (ref 70–99)
Potassium: 4.1 mEq/L (ref 3.5–5.3)

## 2013-02-23 ENCOUNTER — Ambulatory Visit (HOSPITAL_COMMUNITY)
Admission: RE | Admit: 2013-02-23 | Discharge: 2013-02-23 | Disposition: A | Discharge status: Home / Self Care | Payer: Medicare Other | Source: Ambulatory Visit | Attending: Cardiovascular Disease | Admitting: Cardiovascular Disease

## 2013-02-23 DIAGNOSIS — R011 Cardiac murmur, unspecified: Secondary | ICD-10-CM | POA: Insufficient documentation

## 2013-02-23 DIAGNOSIS — R0600 Dyspnea, unspecified: Secondary | ICD-10-CM

## 2013-02-23 DIAGNOSIS — R0989 Other specified symptoms and signs involving the circulatory and respiratory systems: Secondary | ICD-10-CM | POA: Insufficient documentation

## 2013-02-23 DIAGNOSIS — R0609 Other forms of dyspnea: Secondary | ICD-10-CM | POA: Insufficient documentation

## 2013-02-23 DIAGNOSIS — R609 Edema, unspecified: Secondary | ICD-10-CM | POA: Insufficient documentation

## 2013-02-23 NOTE — Progress Notes (Signed)
[facility]   2D echo completed 02/23/2013.   Cindy Iridiana Fonner, RDCS  

## 2013-02-26 ENCOUNTER — Encounter: Payer: Self-pay | Admitting: *Deleted

## 2013-02-26 DIAGNOSIS — I350 Nonrheumatic aortic (valve) stenosis: Secondary | ICD-10-CM

## 2013-02-26 HISTORY — DX: Nonrheumatic aortic (valve) stenosis: I35.0

## 2013-03-01 ENCOUNTER — Encounter: Payer: Medicare Other | Admitting: Thoracic Surgery (Cardiothoracic Vascular Surgery)

## 2013-03-05 ENCOUNTER — Encounter: Payer: Self-pay | Admitting: *Deleted

## 2013-03-09 ENCOUNTER — Encounter: Payer: Self-pay | Admitting: Cardiothoracic Surgery

## 2013-03-09 ENCOUNTER — Other Ambulatory Visit: Payer: Self-pay | Admitting: *Deleted

## 2013-03-09 ENCOUNTER — Encounter: Payer: Medicare Other | Admitting: Cardiothoracic Surgery

## 2013-03-09 ENCOUNTER — Institutional Professional Consult (permissible substitution) (INDEPENDENT_AMBULATORY_CARE_PROVIDER_SITE_OTHER): Payer: Medicare Other | Admitting: Cardiothoracic Surgery

## 2013-03-09 VITALS — BP 109/75 | HR 106 | Resp 20 | Ht 64.0 in | Wt 152.0 lb

## 2013-03-09 DIAGNOSIS — I359 Nonrheumatic aortic valve disorder, unspecified: Secondary | ICD-10-CM

## 2013-03-09 DIAGNOSIS — I350 Nonrheumatic aortic (valve) stenosis: Secondary | ICD-10-CM

## 2013-03-09 NOTE — Patient Instructions (Signed)
Aortic Stenosis Aortic stenosis, or aortic valve stenosis, is a narrowing of the aortic valve. When the aortic valve is narrowed, the valve does not open and close very well. This restricts blood flow between the left side of the heart and the aorta (the large artery which takes blood to the rest of the body). This restriction makes it hard for your heart to pump blood. This extra work can weaken your heart and can lead to heart failure. CAUSES  Causes of aortic valve stenosis can vary. Some of these can include:  Calcium deposits on the aortic valve. Calcium can buildup on the aortic valve and make it stiff. This cause of aortic stenosis is most common in people over the age of 61.  Congenital heart defect. This can occur during the development of the fetus and can result in an aortic valve defect.  Rheumatic fever. Rheumatic fever is a bacterial infection that can develop from a strep throat infection. The bacteria from rheumatic fever can attach themselves to the valve. This can cause scarring on the aortic valve, causing it to become narrow. SYMPTOMS  Symptoms of aortic valve stenosis develop when the valve disease is severe. Symptoms can include:  Shortness of breath, especially with physical activity.  Feeling tired (fatigue).  Chest pain (angina) or tightness.  Feeing your heart race or beat funny (heart palpitations).  Dizziness or fainting. DIAGNOSIS  Aortic stenosis is diagnosed through:  A physical exam and symptoms.  A heart murmur.  Echocardiography. This test uses sound waves to produce images of your heart. TREATMENT   Surgery is the treatment for aortic valve stenosis.  Surgery may not be needed right away. Surgery is necessary when narrowing of the aortic valve becomes severe, and symptoms develop or become worse.  Medications cannot reverse aortic valve stenosis. HOME CARE INSTRUCTIONS   If you have aortic stenosis, you many need to avoid strenuous physical  activity. Talk with your caregiver about what types of activities you should avoid.  If you are a woman with aortic valve stenosis and are of child-bearing age, talk to your caregiver before you become pregnant.  If you become pregnant, you will need to be monitored by your obstetrician and cardiologist throughout your pregnancy, labor and delivery, and after delivery. SEEK IMMEDIATE MEDICAL CARE IF:  You develop chest pain or tightness.  You develop shortness of breath or difficulty breathing.  You develop lightheadedness or fainting.  You have heart palpitations or skipped heartbeats. Document Released: 07/06/2003 Document Revised: 12/30/2011 Document Reviewed: 02/13/2010 [facility] : [facility] Patient Information 2013 [place], [place].  Aortic Valve Replacement You have a disease of one of the valves of your heart. In you or your child's case, it is the aortic valve which needs replacing. Aortic valve replacement is open heart surgery done by a heart surgeon. This operation treats problems with the aortic valve. The aortic valve is the "outflow valve" for the left side of the heart. The left side of your heart (left ventricle) is the large muscular part of the heart that pumps blood to the rest of the body. It separates the left ventricle from the aorta. When the heart squeezes down (contracts), the aortic valve is what keeps the blood from flowing back into the ventricle from the aorta. This allows the blood to keep moving through the body.  Surgery may be necessary when the valve does not open or close completely. A stenotic (narrow) valve does not let the blood leave the heart normally. This causes blood  to back up in the left ventricle. This makes it hard for the heart to increase the amount of blood that it pumps. The heart has to work harder. This may produce shortness of breath and fatigue. Problems are worse with activity.  If the valve leaflets do not meet correctly when closing, blood may leak  backward into the ventricle each time the heart pumps. This is called aortic insufficiency. When some of the blood leaks backwards, the heart has to work even harder. The heart can allow for this over-work for a long time if the leakage came on slowly. Eventually, the heart fails.  Aortic valve problems may be caused by a birth defect. This is called congenital. Wear and tear can cause valves to fail. More commonly, rheumatic fever may damage the aortic valve. Occasionally, the valve may be damaged by infection. This also causes the aortic valve to leak.  DESCRIPTION OF SURGERY Aortic valves can be repaired. When the valve is too damaged to repair, the valve must be replaced. A prosthetic (artificial) valve is used to do this. Valves damaged by rheumatic disease often must be replaced.  Two types of artificial valves are available:  Mechanical valves made entirely from man-made materials.  Biological valves which are made from animal tissues or taken from a cadaver. Each has advantages and disadvantages. The choice of which type to use should be made by you and your surgeon. Your risks, age, lifestyle, other medical problems including the decision on whether to be on blood thinners the rest of your life all will help you decide on which type of valve to use. There are a number of good MECHANICAL PROSTHESES available. All work well. The main advantage of mechanical valves is that they do not wear out. Their main disadvantage is that blood clots easier on mechanical valves. If this happens the valve will not work normally. Because of this, patients with mechanical valves must take anticoagulants (blood thinners) for life. There is also a small but definite risk of blood clots causing stroke, even when taking anticoagulants.  There are a number of BIOLOGICAL CHOICES for aortic valve replacement. Most are made from pig aortic valves. Some are taken from cadavers. The main advantage is that they have a reduced  risk of blood clots forming on the valve. This lessens the chance of the valve not working or causing a stroke. A large disadvantage of biological or tissue valves is that they wear out sooner than mechanical valves. The rate at which they wear out depends on the patient's age. A young boy might wear out such a valve in only a few years. The same valve might last 10 years in a middle aged person, and even longer in a patient over the age of 43. A tissue valve used in a person over 56 years old may never need replacement. RISKS AND COMPLICATIONS Your cardiologist and cardiothoracic surgeon can best determine your individual risk. It will depend on your age, general condition, medical conditions, and your heart function. In general, the risks include:  Problems from the operation itself are low risk. Some common risks are:  Risks from the anesthesia.  Bleeding and infection.  Lifelong treatment with medications to prevent blood clots is needed for mechanical valve replacements.  Infection is more common with valve replacement than with valve repair.  Valve failure is more common with valve replacement than with valve repair. Pig valves tend to fail after about 8 to 10 years. PROCEDURE  Valve repair  or replacement is open-heart surgery. You are given general anesthesia (medications to help you sleep). You are then placed on a heart-lung machine. This machine provides oxygen to your blood while the heart is not working. The surgery generally lasts from 3 to 5 hours. During surgery, the surgeon makes a large incision (cut) in the chest. Sometimes the heart is cooled to slow or stop the heartbeat. The damaged aortic valve is either repaired or removed and replaced with an artificial heart valve. AFTER THE PROCEDURE  Recovery from heart valve surgery usually involves a few days in an intensive care unit (ICU) of a hospital. Full recovery from heart valve surgery can take several  months.  Anticoagulation (blood thinning) treatment with warfarin is often prescribed for 6 weeks to 3 months after surgery for those with biological valves. It is prescribed for life for those with mechanical valves.  Recovery includes healing of the surgical incision. There is a gradual building of stamina and exercise abilities. An exercise program under the direction of a physical therapist may be recommended.  Once you have an artificial valve, your heart function and your life will return to normal. You usually feel better after surgery. Shortness of breath and fatigue should lessen. If your heart was already severely damaged before your surgery, you may continue to have problems.  You can usually resume most of your normal activities. You will have to continue to monitor your condition. You need to watch out for blood clots and infections.  Artificial valves need to be replaced after a period of time. It is important that you see your caregiver regularly.  Some individuals with an aortic valve replacement need to take antibiotics before having dental work or other surgical procedures. This is called prophylactic antibiotic treatment. These drugs help to prevent infective endocarditis. Antibiotics are only recommended for individuals with the highest risk for developing infective endocarditis. Let your dentist and your caregiver know if you have a history of any of the following so that the necessary precautions can be taken:  A VSD.  A repaired VSD.  Endocarditis in the past.  An artificial (prosthetic) heart valve. HOME CARE INSTRUCTIONS   Use all medications as prescribed.  Take your temperature every morning for the first week after surgery. Record these.  Weigh yourself every morning for at least the first week after surgery and record.  Do not lift more than 10 pounds (4.5 kg) until your sternum (breastbone) has healed. Avoid all activities which would place strain on your  incision.  You may shower but do not take baths until instructed by your caregivers.  Avoid driving for 4 to 6 weeks following surgery or as instructed.  Use your elastic stockings during the day. You should wear the stockings for at least 2 weeks after discharge or longer if your ankles are swollen. The stockings help blood flow and help reduce swelling in the legs. It is easiest to put the stockings on before you get out of bed in the morning. They should fit snugly. SEEK IMMEDIATE MEDICAL CARE IF:  You develop chest pain which is not coming from your incision (surgical cut) .  You develop shortness of breath.  You develop a temperature over 101 F (38.3 C).  You have a sudden weight gain. Let your caregiver know what the weight gain is. Document Released: 02/26/2005 Document Revised: 12/30/2011 Document Reviewed: 10/03/2008 [facility] Patient Information 2013 [place], [place].  Aortic Valve Replacement Care After Read the instructions outlined below and  refer to this sheet for the next few weeks. These discharge instructions provide you with general information on caring for yourself after you leave the hospital. Your surgeon may also give you specific instructions. While your treatment has been planned according to the most current medical practices available, unavoidable complications occasionally occur. If you have any problems or questions after discharge, please call your surgeon. AFTER THE PROCEDURE  Full recovery from heart valve surgery can take several months.  Blood thinning (anticoagulation) treatment with warfarin is often prescribed for 6 weeks to 3 months after surgery for those with biological valves. It is prescribed for life for those with mechanical valves.  Recovery includes healing of the surgical incision. There is a gradual building of stamina and exercise abilities. An exercise program under the direction of a physical therapist may be recommended.  Once you have  an artificial valve, your heart function and your life will return to normal. You usually feel better after surgery. Shortness of breath and fatigue should lessen. If your heart was already severely damaged before your surgery, you may continue to have problems.  You can usually resume most of your normal activities. You will have to continue to monitor your condition. You need to watch out for blood clots and infections.  Artificial valves need to be replaced after a period of time. It is important that you see your caregiver regularly.  Some individuals with an aortic valve replacement need to take antibiotics before having dental work or other surgical procedures. This is called prophylactic antibiotic treatment. These drugs help to prevent infective endocarditis. Antibiotics are only recommended for individuals with the highest risk for developing infective endocarditis. Let your dentist and your caregiver know if you have a history of any of the following so that the necessary precautions can be taken:  A VSD.  A repaired VSD.  Endocarditis in the past.  An artificial (prosthetic) heart valve. HOME CARE INSTRUCTIONS   Use all medications as prescribed.  Take your temperature every morning for the first week after surgery. Record these.  Weigh yourself every morning for at least the first week after surgery and record.  Do not lift more than 10 pounds (4.5 kg) until your breastbone (sternum) has healed. Avoid all activities which would place strain on your incision.  You may shower as soon as directed by your caregiver after surgery. Pat incisions dry. Do not rub incisions with washcloth or towel.  Avoid driving for 4 to 6 weeks following surgery or as instructed.  Use your elastic stockings during the day. You should wear the stockings for at least 2 weeks after discharge or longer if your ankles are swollen. The stockings help blood flow and help reduce swelling in the legs. It is  easiest to put the stockings on before you get out of bed in the morning. They should fit snugly. Pain Control  If a prescription was given for a pain reliever, please follow your doctor's directions.  If the pain is not relieved by your medicine, becomes worse, or you have difficulty breathing, call your surgeon. Activity  Take frequent rest periods throughout the day.  Wait one week before returning to strenuous activities such as heavy lifting (more than 10 pounds), pushing or pulling.  Talk with your doctor about when you may return to work and your exercise routine.  Do not drive while taking prescription pain medication. Nutrition  You may resume your normal diet.  Drink plenty of fluids (6-8 glasses a  day).  Eat a well-balanced diet.  Call your caregiver for persistent nausea or vomiting. Elimination Your normal bowel function should return. If constipation should occur, you may:  Take a mild laxative.  Add fruit and bran to your diet.  Drink more fluids.  Call your doctor if constipation is not relieved. SEEK IMMEDIATE MEDICAL CARE IF:   You develop chest pain which is not coming from your surgical cut (incision).  You develop shortness of breath or have difficulty breathing.  You develop a temperature over 101 F (38.3 C).  You have a sudden weight gain. Let your caregiver know what the weight gain is.  You develop a rash.  You develop any reaction or side effects to medications given.  You have increased bleeding from wounds.  You see redness, swelling, or have increasing pain in wounds.  You have pus coming from your wound.  You develop lightheadedness or feel faint. Document Released: 04/25/2005 Document Revised: 12/30/2011 Document Reviewed: 07/17/2005 [facility] Patient Information 2013 [place], [place].

## 2013-03-09 NOTE — Progress Notes (Signed)
[address]            [place] [address]          [phone]      [name] [facility] Medical Record #[id number] Date of Birth: 1935/06/09  Referring: [name]., MD Primary Care: [name], MD  Chief Complaint:    Chief Complaint  Patient presents with  . Aortic Stenosis    surgical eval on severe aortic stenosis, 2D ECHO 02/23/13    History of Present Illness:    Patient is a 78 year old female who is referred by [name] because of the finding of moderate to severe aortic stenosis on recent echocardiogram. The patient notes that for several years she has had increasing shortness of breath but now has progressed to being very dyspneic at rest, which limits her ability to get around. She's had one episode of probable syncope where she fell and cut her head, in addition while driving the car she had a period of possible presyncope. She complains of chest discomfort, swelling in both lower extremities left greater than right, and a feeling of "wanting to push some air into her chest" because of shortness of breath. She has been a lifelong nonsmoker. She does have a history of rheumatoid arthritis. She denies any previous history of asthma, and denies wheezing.      Current Activity/ Functional Status:  Patient will not be independent with mobility/ambulation, transfers, ADL's, IADL's. Currently gets around  with cane, very sob  Zubrod Score: At the time of surgery this patient's most appropriate activity status/level should be described as: []  Normal activity, no symptoms []  Symptoms, fully ambulatory [x]  Symptoms, in bed less than or equal to 50% of the time []  Symptoms, in bed greater than 50% of the time but less than 100% []  Bedridden []  Moribund   Past Medical History  Diagnosis Date  . Osteoporosis   . Rheumatoid arthritis   . Murmur, cardiac   . Diabetes mellitus   . Pneumonia   . Anxiety   . Adrenal  insufficiency, primary, iatrogenic   . Gait instability   . Physical deconditioning     Past Surgical History  Procedure Laterality Date  . Knee arthroscopy      2 on left  . Cholecystectomy    . Thyroidectomy    . Abdominal hysterectomy    . Cardiac catheterization      Family History  Problem Relation Age of Onset  . Stroke Brother    her father died of colon cancer at age 88, mother died of probable myocardial infarction at age 90, one sister died at age 17 with bone cancer one brother died at age 41 with stroke  History   Social History  . Marital Status: Widowed    Spouse Name: N/A    Number of Children: 3  . Years of Education: N/A   Occupational History  . retired    Social History Main Topics  . Smoking status: Never Smoker   . Smokeless tobacco: Never Used  . Alcohol Use: No  . Drug Use: No  . Sexually Active: No      History  Smoking status  . Never Smoker   Smokeless tobacco  . Never Used    History  Alcohol Use No     Allergies  Allergen Reactions  . Penicillins Hives  Current Outpatient Prescriptions  Medication Sig Dispense Refill  . alendronate (FOSAMAX) 70 MG tablet Take 70 mg by mouth every 7 (seven) days. Take with a full glass of water on an empty stomach.[name] on sundays per patient.      . ALPRAZolam (XANAX) 1 MG tablet Take 1 mg by mouth 3 (three) times daily as needed for anxiety.       Marland Kitchen amitriptyline (ELAVIL) 25 MG tablet Take 25 mg by mouth at bedtime.      Marland Kitchen buPROPion (WELLBUTRIN) 75 MG tablet Take 75 mg by mouth every morning.       . furosemide (LASIX) 20 MG tablet Take 20 mg by mouth daily.      Marland Kitchen HYDROcodone-acetaminophen (NORCO/VICODIN) 5-325 MG per tablet Take 1-2 tablets by mouth every 8 (eight) hours as needed for pain.      . hydroxypropyl methylcellulose (ISOPTO TEARS) 2.5 % ophthalmic solution Place 1 drop into both eyes as needed. For dry eyes      . lipase/protease/amylase (CREON-10/PANCREASE) 12000 UNITS  CPEP Take 1 capsule by mouth 3 (three) times daily before meals.  270 capsule  1  . mirtazapine (REMERON) 30 MG tablet Take 30 mg by mouth at bedtime.       . prednisoLONE 5 MG TABS Take 15 mg by mouth daily.      . Vitamin D, Ergocalciferol, (DRISDOL) 50000 UNITS CAPS Take 50,000 Units by mouth every 7 (seven) days. Takes every Sunday per patient.        No current facility-administered medications for this visit.       Review of Systems:     Cardiac Review of Systems: Y or N  Chest Pain [ y   ]  Resting SOB [  y ] Exertional SOB  Cove.Etienne  ]  Orthopnea [ y ]   Pedal Edema [  y ]    Palpitations Cove.Etienne  ] Syncope  Cove.Etienne  ]   Presyncope [ y  ]  General Review of Systems: [Y] = yes [  ]=no Constitional: recent weight change [  ]; anorexia [  ]; fatigue [  ]; nausea [  ]; night sweats [  ]; fever [ n ]; or chills [n  ];                                                                                                                                          Dental: poor dentition[ y ]; Last Dentist visit: 2months ago   Eye : blurred vision [  ]; diplopia [   ]; vision changes [  ];  Amaurosis fugax[  ]; Resp: cough [  ];  wheezing[  ];  hemoptysis[  ]; shortness of breath[  ]; paroxysmal nocturnal dyspnea[  ]; dyspnea on exertion[  ]; or orthopnea[  ];  GI:  gallstones[  ], vomiting[ n ];  dysphagia[  ];  melena[ n ];  hematochezia [  ]; heartburn[  ];   Hx of  Colonoscopy[y  ]; frequent loose stools GU: kidney stones [  ]; hematuria[  ];   dysuria [ frequrnt uti ];  nocturia[  ];  history of     obstruction [  ]; urinary frequency [  ]             Skin: rash, swelling[  ];, hair loss[  ];  peripheral edema[  ];  or itching[  ]; Musculosketetal: myalgias[  ];  joint swelling[  ];  joint erythema[  ];  joint pain[  ];  back pain[  y];  Heme/Lymph: bruising[ y ];  bleeding[ y ];  anemia[  ];  Neuro: TIA[  ]n;  headaches[ n ];  stroke[n  ];  vertigo[ y ];  seizures[  ];   paresthesias[  ];  difficulty walking[   ];  Psych:depression[ y ]; anxiety[ y ];  Endocrine: diabetes[ n ];  thyroid dysfunction[n  ];  Immunizations: Flu [ y ]; Pneumococcal[ y ];  Other:  Physical Exam: BP 109/75  Pulse 106  Resp 20  Ht 5\' 4"  (1.626 m)  Wt 152 lb (68.947 kg)  BMI 26.08 kg/m2  SpO2 96%  General appearance: alert, cooperative, appears older than stated age, fatigued, mild distress and dynpenic at rest Neurologic: intact Heart: regular rate and rhythm and systolic murmur: holosystolic 3/6, blowing throughout the precordium Lungs: wheezes LUL and RUL Abdomen: soft, non-tender; bowel sounds normal; no masses,  no organomegaly Extremities: edema left greater then rt 2+, varicose veins noted and venous stasis dermatitis noted Wound: Area of a healing abrasions over the left lower leg Patient has no carotid bruits, no cervical or supraclavicular adenopathy she has palpable DP and PT pulses and palpable femoral pulses bilaterally   Diagnostic Studies & Laboratory data:  ECHO:Transthoracic Echocardiography  Patient: Leah, [name] [name] #: [id number] Study Date: 02/23/2013 Gender: F Age: 73 Height: 162.6cm Weight: 69.9kg BSA: 1.56m^2 Pt. Status: Room:  ATTENDING Nanetta Batty, MD SONOGRAPHER Metro Kung, RDCS ORDERING Hilty, Bridget Hartshorn Hilty, Iantha Fallen PERFORMING [facility] cc:  ------------------------------------------------------------ LV EF: 55% - 60%  ------------------------------------------------------------ Indications: Dyspnea 786.09.  ------------------------------------------------------------ History: Risk factors: Palpitations, rheumatoid arthritis, anxiety, shortness of breath  ------------------------------------------------------------ Study Conclusions  - Left ventricle: The cavity size was normal. There was moderate concentric hypertrophy. Systolic function was normal. The estimated ejection fraction was in the range of 55% to 60%. Wall motion was normal; there  were no regional wall motion abnormalities. Doppler parameters are consistent with abnormal left ventricular relaxation (grade 1 diastolic dysfunction). - Aortic valve: A bicuspid morphology cannot be excluded; severely thickened, severely calcified leaflets. There was moderate to severe stenosis. Trivial regurgitation. Valve area: 1.02cm^2(VTI). Valve area: 0.98cm^2 (Vmax). - Mitral valve: Calcified annulus. Mildly thickened leaflets . Valve area by continuity equation (using LVOT flow): 4.64cm^2. Transthoracic echocardiography. M-mode, complete 2D, spectral Doppler, and color Doppler. Height: Height: 162.6cm. Height: 64in. Weight: Weight: 69.9kg. Weight: 153.7lb. Body mass index: BMI: 26.4kg/m^2. Body surface area: BSA: 1.37m^2. Blood pressure: 130/72. Patient status: Outpatient. Location: Echo laboratory.  ------------------------------------------------------------  ------------------------------------------------------------ Left ventricle: The cavity size was normal. There was moderate concentric hypertrophy. Systolic function was normal. The estimated ejection fraction was in the range of 55% to 60%. Wall motion was normal; there were no regional wall motion abnormalities. Doppler parameters are consistent with abnormal left ventricular relaxation (grade 1 diastolic dysfunction). There was no evidence of elevated ventricular filling pressure by  Doppler parameters.  ------------------------------------------------------------ Aortic valve: A bicuspid morphology cannot be excluded; severely thickened, severely calcified leaflets. Doppler: There was moderate to severe stenosis. Trivial regurgitation. Valve area: 1.02cm^2(VTI). Indexed valve area: 0.58cm^2/m^2 (VTI). Valve area: 0.98cm^2 (Vmax). Indexed valve area: 0.56cm^2/m^2 (Vmax). Mean gradient: 39mm Hg (S). Peak gradient: 58mm Hg (S).  ------------------------------------------------------------ Aorta: The aorta was  normal, not dilated, and non-diseased.  ------------------------------------------------------------ Mitral valve: Calcified annulus. Mildly thickened leaflets . Leaflet separation was normal. Doppler: Transvalvular velocity was within the normal range. There was no evidence for stenosis. Trivial regurgitation. Valve area by pressure half-time: 8.46cm^2. Indexed valve area by pressure half-time: 4.83cm^2/m^2. Valve area by continuity equation (using LVOT flow): 4.64cm^2. Indexed valve area by continuity equation (using LVOT flow): 2.65cm^2/m^2. Mean gradient: 1mm Hg (D).  ------------------------------------------------------------ Left atrium: LA Volume/BSA 26.9 ml/m2. The atrium was at the upper limits of normal in size.  ------------------------------------------------------------ Right ventricle: The cavity size was normal. Wall thickness was normal. Systolic function was normal.  ------------------------------------------------------------ Pulmonic valve: Poorly visualized. The valve appears to be grossly normal.  ------------------------------------------------------------ Tricuspid valve: Structurally normal valve. Leaflet separation was normal. Doppler: Transvalvular velocity was within the normal range. No regurgitation.  ------------------------------------------------------------ Pulmonary artery: Systolic pressure was within the normal range.  ------------------------------------------------------------ Right atrium: The atrium was normal in size.  ------------------------------------------------------------ Pericardium: There was no pericardial effusion.  ------------------------------------------------------------ Systemic veins: Inferior vena cava: The vessel was normal in size; the respirophasic diameter changes were in the normal range (= 50%); findings are consistent with normal central  venous pressure.  ------------------------------------------------------------ Post procedure conclusions Ascending Aorta:  - The aorta was normal, not dilated, and non-diseased.  ------------------------------------------------------------  2D measurements Normal Doppler measurements Normal Left ventricle Main pulmonary LVID ED, 40.2 mm 43-52 artery chord, Pressure, 28 mm Hg =30 PLAX S LVID ES, 33 mm 23-38 Left ventricle chord, Ea, lat 7.4 cm/s ------ PLAX ann, tiss FS, chord, 18 % >29 DP PLAX E/Ea, lat 5.68 ------ LVPW, ED 14.8 mm ------ ann, tiss IVS/LVPW 1.12 <1.3 DP ratio, ED Ea, med 7.7 cm/s ------ Ventricular septum ann, tiss IVS, ED 16.6 mm ------ DP LVOT E/Ea, med 5.45 ------ Diam, S 27 mm ------ ann, tiss Area 5.73 cm^2 ------ DP Aorta Aortic valve Root diam, 38 mm ------ Peak vel, 379 cm/s ------ ED S Left atrium Mean vel, 301 cm/s ------ AP dim 38 mm ------ S AP dim 2.17 cm/m^2 <2.2 VTI, S 75.6 cm ------ index Mean 39 mm Hg ------ gradient, M-mode measurements Normal S Mitral valve Peak 58 mm Hg ------ E-septal 16 mm ------ gradient, separation S Area, VTI 1.02 cm^2 ------ Area index 0.58 cm^2/m ------ (VTI) ^2 Area, Vmax 0.98 cm^2 ------ Area index 0.56 cm^2/m ------ (Vmax) ^2 Mitral valve Peak E vel 42 cm/s ------ Peak A vel 82.9 cm/s ------ Mean vel, 39.3 cm/s ------ D Decelerati 148 ms 150-23 on time 0 Pressure 26 ms ------ half-time Mean 1 mm Hg ------ gradient, D Peak E/A 0.5 ------ ratio Area (PHT) 8.46 cm^2 ------ Area index 4.83 cm^2/m ------ (PHT) ^2 Area 4.64 cm^2 ------ (LVOT) continuity Area index 2.65 cm^2/m ------ (LVOT ^2 cont) Annulus 17.3 cm ------ VTI Tricuspid valve Regurg 241 cm/s ------ peak vel Peak RV-RA 23 mm Hg ------ gradient, S Max regurg 241 cm/s ------ vel Systemic veins Estimated 5 mm Hg ------ CVP Right ventricle Pressure, 28 mm Hg <30 S Sa vel, 17.4 cm/s ------ lat ann, tiss  DP  ------------------------------------------------------------ Prepared and Electronically Authenticated by  Croitoru, Mihai  [id number]      Recent Radiology Findings:  Dg Chest 2 View  02/22/2013   *RADIOLOGY REPORT*  Clinical Data: Dyspnea.  Rheumatoid arthritis.  CHEST - 2 VIEW  Comparison: 11/11/2012.  Findings: The cardiac silhouette, mediastinal and hilar contours are within normal limits and stable.  Stable eventration of the hemidiaphragms and prominent epicardial fat.  The lungs are clear acute process.  No pleural effusion.  The bony thorax is intact.  IMPRESSION: No acute cardiopulmonary findings.   Original Report Authenticated By: Rudie Meyer, M.D.   Ct Head Wo Contrast  02/09/2013   *RADIOLOGY REPORT*  Clinical Data: Fall.  Trauma to back of head.  Laceration. Hematoma.  Occipital pain.  CT HEAD WITHOUT CONTRAST  Technique:  Contiguous axial images were obtained from the base of the skull through the vertex without contrast.  Comparison: None.  Findings: No acute cortical infarct, hemorrhage, mass lesion is present.  Atrophy and white matter disease is similar to the prior study.  The ventricles are of normal size.  No significant extra- axial fluid collection is present.  A prominent right parietal scalp hematoma and laceration is evident.  There is no underlying fracture.  No foreign body is evident.  IMPRESSION:  1.  Prominent right parietal scalp laceration and hematoma near the vertex without an underlying fracture. 2.  Stable atrophy and white matter disease. 3.  No acute intracranial abnormality.   Original Report Authenticated By: Marin Roberts, M.D.     Recent Lab Findings: Lab Results  Component Value Date   WBC 9.1 01/10/2013   HGB 14.2 01/10/2013   HCT 42.9 01/10/2013   PLT 172 01/10/2013   GLUCOSE 86 01/10/2013   ALT 11 01/10/2013   AST 24 01/10/2013   NA 135 01/10/2013   K 3.3* 01/10/2013   CL 96 01/10/2013   CREATININE 0.62 01/10/2013   BUN 13  01/10/2013   CO2 26 01/10/2013   INR 0.87 09/16/2011   Dr Rennis Golden reports BNP in his office 92  Assessment / Plan:     Moderate to severe aortic stenosis with dyspnea symptoms but potentially may be at a proportion to the degree of aortic stenosis, it is unclear if her symptoms are related to congestive heart failure but she has a remarkably low BNP. I discussed with her the risks and procedure for aortic valve replacement. I recommended to her that that we proceed with further evaluation before definite aortic valve is. She will need a cardiac catheterization with right and left heart cath catheterization is being done, and we will obtain pulmonary function studies. After the studies are done we'll further review her symptoms findings and make a final recommendation in consultation with [name] and possible pulmonary consultation.       Delight Ovens MD  Beeper [phone] Office [phone] 03/09/2013 2:22 PM

## 2013-03-10 ENCOUNTER — Telehealth: Payer: Self-pay | Admitting: *Deleted

## 2013-03-10 DIAGNOSIS — I350 Nonrheumatic aortic (valve) stenosis: Secondary | ICD-10-CM

## 2013-03-10 NOTE — Telephone Encounter (Signed)
[name] gave me a verbal order to proceed with right and left cardiac cath.  [name] got a call from the surgeons and they want him to proceed with a right and left cath.  I called pt's daughter, Olegario Messier, and explained the order. I spoke with patient as well. Everyone is agreeable and will proceed with the cath

## 2013-03-12 ENCOUNTER — Other Ambulatory Visit: Payer: Self-pay | Admitting: *Deleted

## 2013-03-12 ENCOUNTER — Encounter: Payer: Self-pay | Admitting: Internal Medicine

## 2013-03-12 DIAGNOSIS — R5383 Other fatigue: Secondary | ICD-10-CM

## 2013-03-12 DIAGNOSIS — D689 Coagulation defect, unspecified: Secondary | ICD-10-CM

## 2013-03-12 DIAGNOSIS — Z79899 Other long term (current) drug therapy: Secondary | ICD-10-CM

## 2013-03-16 ENCOUNTER — Other Ambulatory Visit: Payer: Self-pay | Admitting: *Deleted

## 2013-03-16 DIAGNOSIS — R5381 Other malaise: Secondary | ICD-10-CM

## 2013-03-16 DIAGNOSIS — D689 Coagulation defect, unspecified: Secondary | ICD-10-CM

## 2013-03-16 DIAGNOSIS — Z79899 Other long term (current) drug therapy: Secondary | ICD-10-CM

## 2013-03-16 LAB — BASIC METABOLIC PANEL
CO2: 30 mEq/L (ref 19–32)
Chloride: 102 mEq/L (ref 96–112)
Potassium: 4.4 mEq/L (ref 3.5–5.3)

## 2013-03-16 LAB — CBC
HCT: 42.4 % (ref 36.0–46.0)
Hemoglobin: 14.2 g/dL (ref 12.0–15.0)
MCH: 30.1 pg (ref 26.0–34.0)
MCV: 89.8 fL (ref 78.0–100.0)
RBC: 4.72 MIL/uL (ref 3.87–5.11)

## 2013-03-16 LAB — TSH: TSH: 1.755 u[IU]/mL (ref 0.350–4.500)

## 2013-03-16 LAB — PROTIME-INR: INR: 0.89 (ref <1.50)

## 2013-03-17 ENCOUNTER — Ambulatory Visit (HOSPITAL_COMMUNITY)
Admission: RE | Admit: 2013-03-17 | Discharge: 2013-03-17 | Disposition: A | Discharge status: Home / Self Care | Payer: Medicare Other | Source: Ambulatory Visit | Attending: Cardiothoracic Surgery | Admitting: Cardiothoracic Surgery

## 2013-03-17 DIAGNOSIS — R0989 Other specified symptoms and signs involving the circulatory and respiratory systems: Secondary | ICD-10-CM | POA: Insufficient documentation

## 2013-03-17 DIAGNOSIS — R0609 Other forms of dyspnea: Secondary | ICD-10-CM | POA: Insufficient documentation

## 2013-03-17 DIAGNOSIS — I350 Nonrheumatic aortic (valve) stenosis: Secondary | ICD-10-CM

## 2013-03-17 LAB — PULMONARY FUNCTION TEST

## 2013-03-17 MED ORDER — ALBUTEROL SULFATE (5 MG/ML) 0.5% IN NEBU
2.5000 mg | INHALATION_SOLUTION | Freq: Once | RESPIRATORY_TRACT | Status: AC
  Administered 2013-03-17: 2.5 mg via RESPIRATORY_TRACT

## 2013-03-18 ENCOUNTER — Encounter (HOSPITAL_COMMUNITY): Payer: Self-pay | Admitting: Pharmacy Technician

## 2013-03-19 ENCOUNTER — Other Ambulatory Visit: Payer: Self-pay | Admitting: *Deleted

## 2013-03-19 DIAGNOSIS — Z01810 Encounter for preprocedural cardiovascular examination: Secondary | ICD-10-CM

## 2013-03-21 ENCOUNTER — Encounter: Payer: Self-pay | Admitting: *Deleted

## 2013-03-22 ENCOUNTER — Encounter (HOSPITAL_COMMUNITY)
Admission: RE | Disposition: A | Discharge status: Home / Self Care | Payer: Self-pay | Source: Ambulatory Visit | Attending: Internal Medicine

## 2013-03-22 ENCOUNTER — Encounter: Payer: Self-pay | Admitting: Internal Medicine

## 2013-03-22 ENCOUNTER — Ambulatory Visit (HOSPITAL_COMMUNITY)
Admission: RE | Admit: 2013-03-22 | Discharge: 2013-03-22 | Disposition: A | Discharge status: Home / Self Care | Payer: Medicare Other | Source: Ambulatory Visit | Attending: Internal Medicine | Admitting: Internal Medicine

## 2013-03-22 DIAGNOSIS — Z8249 Family history of ischemic heart disease and other diseases of the circulatory system: Secondary | ICD-10-CM | POA: Insufficient documentation

## 2013-03-22 DIAGNOSIS — Z8701 Personal history of pneumonia (recurrent): Secondary | ICD-10-CM | POA: Insufficient documentation

## 2013-03-22 DIAGNOSIS — E2749 Other adrenocortical insufficiency: Secondary | ICD-10-CM | POA: Insufficient documentation

## 2013-03-22 DIAGNOSIS — J8410 Pulmonary fibrosis, unspecified: Secondary | ICD-10-CM | POA: Insufficient documentation

## 2013-03-22 DIAGNOSIS — M810 Age-related osteoporosis without current pathological fracture: Secondary | ICD-10-CM | POA: Insufficient documentation

## 2013-03-22 DIAGNOSIS — I359 Nonrheumatic aortic valve disorder, unspecified: Secondary | ICD-10-CM | POA: Insufficient documentation

## 2013-03-22 DIAGNOSIS — M069 Rheumatoid arthritis, unspecified: Secondary | ICD-10-CM | POA: Insufficient documentation

## 2013-03-22 DIAGNOSIS — R079 Chest pain, unspecified: Secondary | ICD-10-CM

## 2013-03-22 DIAGNOSIS — F411 Generalized anxiety disorder: Secondary | ICD-10-CM | POA: Insufficient documentation

## 2013-03-22 DIAGNOSIS — E119 Type 2 diabetes mellitus without complications: Secondary | ICD-10-CM | POA: Insufficient documentation

## 2013-03-22 DIAGNOSIS — Z01810 Encounter for preprocedural cardiovascular examination: Secondary | ICD-10-CM

## 2013-03-22 DIAGNOSIS — R9439 Abnormal result of other cardiovascular function study: Secondary | ICD-10-CM | POA: Insufficient documentation

## 2013-03-22 DIAGNOSIS — Z823 Family history of stroke: Secondary | ICD-10-CM | POA: Insufficient documentation

## 2013-03-22 DIAGNOSIS — I350 Nonrheumatic aortic (valve) stenosis: Secondary | ICD-10-CM

## 2013-03-22 DIAGNOSIS — R0602 Shortness of breath: Secondary | ICD-10-CM

## 2013-03-22 DIAGNOSIS — R55 Syncope and collapse: Secondary | ICD-10-CM

## 2013-03-22 DIAGNOSIS — Z880 Allergy status to penicillin: Secondary | ICD-10-CM | POA: Insufficient documentation

## 2013-03-22 DIAGNOSIS — Z79899 Other long term (current) drug therapy: Secondary | ICD-10-CM | POA: Insufficient documentation

## 2013-03-22 DIAGNOSIS — R531 Weakness: Secondary | ICD-10-CM

## 2013-03-22 HISTORY — PX: CORONARY ANGIOGRAM: SHX5466

## 2013-03-22 HISTORY — PX: RIGHT HEART CATHETERIZATION: SHX5447

## 2013-03-22 LAB — POCT I-STAT 3, ART BLOOD GAS (G3+)
Bicarbonate: 27.3 mEq/L — ABNORMAL HIGH (ref 20.0–24.0)
pH, Arterial: 7.391 (ref 7.350–7.450)
pO2, Arterial: 56 mmHg — ABNORMAL LOW (ref 80.0–100.0)

## 2013-03-22 LAB — POCT I-STAT 3, VENOUS BLOOD GAS (G3P V)
Bicarbonate: 25.4 mEq/L — ABNORMAL HIGH (ref 20.0–24.0)
pH, Ven: 7.329 — ABNORMAL HIGH (ref 7.250–7.300)

## 2013-03-22 SURGERY — RIGHT HEART CATH

## 2013-03-22 MED ORDER — ONDANSETRON HCL 4 MG/2ML IJ SOLN: 4.0000 mg | Freq: Four times a day (QID) | INTRAMUSCULAR | Status: DC | PRN

## 2013-03-22 MED ORDER — SODIUM CHLORIDE 0.9 % IJ SOLN: 3.0000 mL | INTRAMUSCULAR | Status: DC | PRN

## 2013-03-22 MED ORDER — HEPARIN (PORCINE) IN NACL 2-0.9 UNIT/ML-% IJ SOLN
INTRAMUSCULAR | Status: AC
  Filled 2013-03-22: qty 1500

## 2013-03-22 MED ORDER — SODIUM CHLORIDE 0.9 % IV SOLN: 1.0000 mL/kg/h | INTRAVENOUS | Status: DC

## 2013-03-22 MED ORDER — ACETAMINOPHEN 325 MG PO TABS: 650.0000 mg | ORAL_TABLET | ORAL | Status: DC | PRN

## 2013-03-22 MED ORDER — LIDOCAINE HCL (PF) 1 % IJ SOLN
INTRAMUSCULAR | Status: AC
  Filled 2013-03-22: qty 30

## 2013-03-22 MED ORDER — MIDAZOLAM HCL 2 MG/2ML IJ SOLN
INTRAMUSCULAR | Status: AC
  Filled 2013-03-22: qty 2

## 2013-03-22 MED ORDER — SODIUM CHLORIDE 0.9 % IV SOLN
INTRAVENOUS | Status: DC
  Administered 2013-03-22: 11:00:00 via INTRAVENOUS
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
## 2013-03-22 NOTE — H&P (Signed)
   INTERVAL PROCEDURE H&P  History and Physical Interval Note:  03/22/2013 10:22 AM  [name] [name] has presented today for their planned procedure. The various methods of treatment have been discussed with the patient and family. After consideration of risks, benefits and other options for treatment, the patient has consented to the procedure.  The patients' outpatient history has been reviewed, patient examined, and no change in status from most recent office note within the past 30 days. I have reviewed the patients' chart and labs and will proceed as planned. Questions were answered to the patient's satisfaction.   [name], MD, [facility] Attending Cardiologist [facility]  [name] 03/22/2013, 10:22 AM

## 2013-03-25 ENCOUNTER — Encounter: Payer: Self-pay | Admitting: Thoracic Surgery (Cardiothoracic Vascular Surgery)

## 2013-03-25 ENCOUNTER — Encounter: Payer: Self-pay | Admitting: Cardiothoracic Surgery

## 2013-03-25 ENCOUNTER — Ambulatory Visit (INDEPENDENT_AMBULATORY_CARE_PROVIDER_SITE_OTHER): Payer: Medicare Other | Admitting: Thoracic Surgery (Cardiothoracic Vascular Surgery)

## 2013-03-25 ENCOUNTER — Ambulatory Visit (INDEPENDENT_AMBULATORY_CARE_PROVIDER_SITE_OTHER): Payer: Medicare Other | Admitting: Cardiothoracic Surgery

## 2013-03-25 VITALS — Ht 64.0 in | Wt 152.0 lb

## 2013-03-25 VITALS — BP 121/67 | HR 100 | Resp 20 | Ht 64.0 in | Wt 152.0 lb

## 2013-03-25 DIAGNOSIS — F32A Depression, unspecified: Secondary | ICD-10-CM

## 2013-03-25 DIAGNOSIS — R413 Other amnesia: Secondary | ICD-10-CM

## 2013-03-25 DIAGNOSIS — R55 Syncope and collapse: Secondary | ICD-10-CM

## 2013-03-25 DIAGNOSIS — F3289 Other specified depressive episodes: Secondary | ICD-10-CM

## 2013-03-25 DIAGNOSIS — I359 Nonrheumatic aortic valve disorder, unspecified: Secondary | ICD-10-CM

## 2013-03-25 DIAGNOSIS — I350 Nonrheumatic aortic (valve) stenosis: Secondary | ICD-10-CM

## 2013-03-25 DIAGNOSIS — J8410 Pulmonary fibrosis, unspecified: Secondary | ICD-10-CM

## 2013-03-25 DIAGNOSIS — F329 Major depressive disorder, single episode, unspecified: Secondary | ICD-10-CM

## 2013-03-25 DIAGNOSIS — H9190 Unspecified hearing loss, unspecified ear: Secondary | ICD-10-CM

## 2013-03-25 HISTORY — DX: Depression, unspecified: F32.A

## 2013-03-25 HISTORY — DX: Other amnesia: R41.3

## 2013-03-25 HISTORY — DX: Pulmonary fibrosis, unspecified: J84.10

## 2013-03-25 NOTE — Progress Notes (Signed)
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 0.63 L  (40% predicted)  RV  2.18 L  (96% predicted) DLCO  44% predicted    STS Risk Calculator  Procedure    AVR  Risk of Mortality   7.4% Morbidity or Mortality  25.5% Prolonged LOS   16.3% Short LOS    22.4% Permanent Stroke   1.6% Prolonged Vent Support  18.3% DSW Infection    0.4% Renal Failure    8.7% Reoperation    8.0%    Impression:  The patient has severe symptomatic aortic stenosis with preserved left ventricular systolic function. Risks associated with conventional surgical aortic valve replacement will be high because of the patient's age, female sex, possible severe chronic lung disease with pulmonary fibrosis, chronic immunosuppression with prednisone, and considerable frailty.   Because of these numerous comorbidities, I agree that the patient's risks of both  morbidity and mortality might be considerably higher than that based upon traditional risk models. Transcatheter aortic valve replacement might be a reasonable alternative to conventional surgery.  I also agree that the patient's chronic shortness of breath may be multifactorial, and it would be reasonable for her to be formally evaluated by pulmonary medicine to try to sort out how much of her symptoms might be related to chronic lung disease. In addition, the patient's multiple recent falls and near syncopal episodes are somewhat atypical and concerning that the patient may have a several contributing factors to her recent physical decline.  However, I have directly reviewed the patient's recent transthoracic echocardiogram and I do agree that there is no question she has severe calcific aortic stenosis.    Plan:  The patient and her family were counseled at length regarding treatment alternatives for management of severe symptomatic aortic stenosis. Alternative approaches such as conventional aortic valve replacement, transcatheter aortic valve replacement, and palliative medical therapy were compared and contrasted at length.  The risks  associated with conventional surgical aortic valve replacement were been discussed in detail, as were expectations for post-operative convalescence. Long-term prognosis with medical therapy was discussed. This discussion was placed in the context of the patient's own specific clinical presentation and past medical history.  All of their questions been addressed.  We will obtain cardiac gated CT angiogram of the heart with CT scan of the chest to further evaluate the anatomical characteristics of her aortic stenosis, rule out significant calcification or atherosclerotic disease of the thoracic aorta, and formally evaluate the lungs for the presence of significant pulmonary fibrosis. The patient will be sent for pulmonary medicine consultation. Patient will also be formally  evaluated by physical therapy to include a 6 minute walk test and assessment of her physical mobility and strength. She will be seen back here in the office within the next few weeks once this had been completed to discuss treatment options further.     Salvatore Decent. Cornelius Moras, MD 03/25/2013 6:33 PM

## 2013-03-26 ENCOUNTER — Other Ambulatory Visit: Payer: Self-pay | Admitting: *Deleted

## 2013-03-26 DIAGNOSIS — I359 Nonrheumatic aortic valve disorder, unspecified: Secondary | ICD-10-CM

## 2013-03-26 NOTE — Progress Notes (Signed)
[address]       [place] [address]             [phone]                       [name] [facility] Medical Record #[id number] Date of Birth: May 17, 1935  Referring: [name]., MD Primary Care: [name], MD  Chief Complaint:    Chief Complaint  Patient presents with  . Aortic Stenosis    F/u to discuss PFT's and Cardiac Cath    History of Present Illness:    Patient is a 78 year old female who is referred by [name] because of the finding of moderate to severe aortic stenosis on recent echocardiogram. The patient notes that for several years she has had increasing shortness of breath but now has progressed to being very dyspneic at rest, which limits her ability to get around. She's had one episode of probable syncope where she fell and cut her head, in addition while driving the car she had a period of possible presyncope. She complains of chest discomfort, swelling in both lower extremities left greater than right, and a feeling of "wanting to push some air into her chest" because of shortness of breath. She has been a lifelong nonsmoker. She does have a history of rheumatoid arthritis. She denies any previous history of asthma, and denies wheezing.      Current Activity/ Functional Status:  Patient will not be independent with mobility/ambulation, transfers, ADL's, IADL's. Currently gets around  with cane, very sob  Zubrod Score: At the time of surgery this patient's most appropriate activity status/level should be described as: []  Normal activity, no symptoms []  Symptoms, fully ambulatory [x]  Symptoms, in bed less than or equal to 50% of the time []  Symptoms, in bed greater than 50% of the time but less than 100% []  Bedridden []  Moribund   Past Medical History  Diagnosis Date  . Osteoporosis   . Rheumatoid arthritis(714.0)   . Murmur, cardiac   . Diabetes mellitus   . Pneumonia   . Anxiety   . Adrenal  insufficiency, primary, iatrogenic   . Gait instability   . Physical deconditioning   . SOBOE (shortness of breath on exertion)     echo 02/23/13-EF 55-60%, possible bicuspid aortic valve with mod to severed stenosis   . Palpitations   . Chest pain     myoview 04/13/04-inferoapical thinning with a suggestion of mild apical ischemia, EF 66%  . Syncope   . Aortic stenosis, severe 02/26/2013  . Pancreatic insufficiency 11/09/2012  . Type II or unspecified type diabetes mellitus without mention of complication, not stated as uncontrolled 10/01/2011  . UTI (lower urinary tract infection) 10/01/2011  . Hearing loss 03/25/2013  . Memory deficit 03/25/2013  . Depression 03/25/2013  . Pulmonary fibrosis 03/25/2013    Past Surgical History  Procedure Laterality Date  . Knee arthroscopy      2 on left  . Cholecystectomy    . Thyroidectomy    . Abdominal hysterectomy    . Cardiac catheterization  11/08/96    no evidence of CAD, nl LV function  . Shoulder surgery Left     Family History  Problem Relation Age of Onset  . Stroke Brother   . Cancer - Colon Father    her father died of colon cancer at age 27, mother died of probable myocardial infarction at age 72, one  sister died at age 57 with bone cancer one brother died at age 41 with stroke  History   Social History  . Marital Status: Widowed    Spouse Name: N/A    Number of Children: 3  . Years of Education: N/A   Occupational History  . retired    Social History Main Topics  . Smoking status: Never Smoker   . Smokeless tobacco: Never Used  . Alcohol Use: No  . Drug Use: No  . Sexually Active: No      History  Smoking status  . Never Smoker   Smokeless tobacco  . Never Used    History  Alcohol Use No     Allergies  Allergen Reactions  . Penicillins Hives    Current Outpatient Prescriptions  Medication Sig Dispense Refill  . alendronate (FOSAMAX) 70 MG tablet Take 70 mg by mouth every 7 (seven) days. Take with a full  glass of water on an empty stomach.[name] on sundays per patient.      . ALPRAZolam (XANAX) 1 MG tablet Take 1 mg by mouth 3 (three) times daily as needed for anxiety.       Marland Kitchen amitriptyline (ELAVIL) 25 MG tablet Take 75 mg by mouth at bedtime.       Marland Kitchen buPROPion (WELLBUTRIN) 75 MG tablet Take 75 mg by mouth every morning.       . furosemide (LASIX) 20 MG tablet Take 20 mg by mouth daily.      Marland Kitchen HYDROcodone-acetaminophen (NORCO/VICODIN) 5-325 MG per tablet Take 1-2 tablets by mouth every 8 (eight) hours as needed for pain.      . mirtazapine (REMERON) 30 MG tablet Take 30 mg by mouth at bedtime.       . prednisoLONE 5 MG TABS Take 15 mg by mouth daily.      . Vitamin D, Ergocalciferol, (DRISDOL) 50000 UNITS CAPS Take 50,000 Units by mouth every 7 (seven) days. Takes every Sunday per patient.       No current facility-administered medications for this visit.       Review of Systems:     Cardiac Review of Systems: Y or N  Chest Pain [ y   ]  Resting SOB [  y ] Exertional SOB  Cove.Etienne  ]  Orthopnea [ y ]   Pedal Edema [  y ]    Palpitations Cove.Etienne  ] Syncope  Cove.Etienne  ]   Presyncope [ y  ]  General Review of Systems: [Y] = yes [  ]=no Constitional: recent weight change [  ]; anorexia [  ]; fatigue [  ]; nausea [  ]; night sweats [  ]; fever [ n ]; or chills [n  ];  Dental: poor dentition[ y ]; Last Dentist visit: 2months ago   Eye : blurred vision [  ]; diplopia [   ]; vision changes [  ];  Amaurosis fugax[  ]; Resp: cough [  ];  wheezing[  ];  hemoptysis[  ]; shortness of breath[  ]; paroxysmal nocturnal dyspnea[  ]; dyspnea on exertion[  ]; or orthopnea[  ];  GI:  gallstones[  ], vomiting[ n ];  dysphagia[  ]; melena[ n ];  hematochezia [  ]; heartburn[  ];   Hx of  Colonoscopy[y  ]; frequent loose stools GU: kidney stones [  ]; hematuria[  ];   dysuria [ frequrnt uti ];   nocturia[  ];  history of     obstruction [  ]; urinary frequency [  ]             Skin: rash, swelling[  ];, hair loss[  ];  peripheral edema[  ];  or itching[  ]; Musculosketetal: myalgias[  ];  joint swelling[  ];  joint erythema[  ];  joint pain[  ];  back pain[  y];  Heme/Lymph: bruising[ y ];  bleeding[ y ];  anemia[  ];  Neuro: TIA[  ]n;  headaches[ n ];  stroke[n  ];  vertigo[ y ];  seizures[  ];   paresthesias[  ];  difficulty walking[  ];  Psych:depression[ y ]; anxiety[ y ];  Endocrine: diabetes[ n ];  thyroid dysfunction[n  ];  Immunizations: Flu [ y ]; Pneumococcal[ y ];  Other:  Physical Exam: BP 121/67  Pulse 100  Resp 20  Ht 5\' 4"  (1.626 m)  Wt 152 lb (68.947 kg)  BMI 26.08 kg/m2  SpO2 96%  General appearance: alert, cooperative, appears older than stated age, fatigued, mild distress and dynpenic at rest Neurologic: intact Heart: regular rate and rhythm and systolic murmur: holosystolic 3/6, blowing throughout the precordium Lungs: wheezes LUL and RUL Abdomen: soft, non-tender; bowel sounds normal; no masses,  no organomegaly Extremities: edema left greater then rt 2+, varicose veins noted and venous stasis dermatitis noted Wound: Area of a healing abrasions over the left lower leg Patient has no carotid bruits, no cervical or supraclavicular adenopathy she has palpable DP and PT pulses and palpable femoral pulses bilaterally   Diagnostic Studies & Laboratory data:  ECHO:Transthoracic Echocardiography  Patient: [name], [name] MR #: [id number] Study Date: 02/23/2013 Gender: F Age: 17 Height: 162.6cm Weight: 69.9kg BSA: 1.10m^2 Pt. Status: Room:  ATTENDING Nanetta Batty, MD SONOGRAPHER Metro Kung, RDCS ORDERING Hilty, Bridget Hartshorn Hilty, Iantha Fallen PERFORMING [facility] cc:  ------------------------------------------------------------ LV EF: 55% - 60%  ------------------------------------------------------------ Indications: Dyspnea  786.09.  ------------------------------------------------------------ History: Risk factors: Palpitations, rheumatoid arthritis, anxiety, shortness of breath  ------------------------------------------------------------ Study Conclusions  - Left ventricle: The cavity size was normal. There was moderate concentric hypertrophy. Systolic function was normal. The estimated ejection fraction was in the range of 55% to 60%. Wall motion was normal; there were no regional wall motion abnormalities. Doppler parameters are consistent with abnormal left ventricular relaxation (grade 1 diastolic dysfunction). - Aortic valve: A bicuspid morphology cannot be excluded; severely thickened, severely calcified leaflets. There was moderate to severe stenosis. Trivial regurgitation. Valve area: 1.02cm^2(VTI). Valve area: 0.98cm^2 (Vmax). - Mitral valve: Calcified annulus. Mildly thickened leaflets . Valve area by continuity equation (using LVOT flow): 4.64cm^2. Transthoracic echocardiography. M-mode, complete 2D, spectral Doppler, and color Doppler. Height: Height: 162.6cm. Height: 64in. Weight: Weight: 69.9kg. Weight: 153.7lb. Body mass index: BMI: 26.4kg/m^2. Body  surface area: BSA: 1.33m^2. Blood pressure: 130/72. Patient status: Outpatient. Location: Echo laboratory.  ------------------------------------------------------------  ------------------------------------------------------------ Left ventricle: The cavity size was normal. There was moderate concentric hypertrophy. Systolic function was normal. The estimated ejection fraction was in the range of 55% to 60%. Wall motion was normal; there were no regional wall motion abnormalities. Doppler parameters are consistent with abnormal left ventricular relaxation (grade 1 diastolic dysfunction). There was no evidence of elevated ventricular filling pressure by Doppler  parameters.  ------------------------------------------------------------ Aortic valve: A bicuspid morphology cannot be excluded; severely thickened, severely calcified leaflets. Doppler: There was moderate to severe stenosis. Trivial regurgitation. Valve area: 1.02cm^2(VTI). Indexed valve area: 0.58cm^2/m^2 (VTI). Valve area: 0.98cm^2 (Vmax). Indexed valve area: 0.56cm^2/m^2 (Vmax). Mean gradient: 39mm Hg (S). Peak gradient: 58mm Hg (S).  ------------------------------------------------------------ Aorta: The aorta was normal, not dilated, and non-diseased.  ------------------------------------------------------------ Mitral valve: Calcified annulus. Mildly thickened leaflets . Leaflet separation was normal. Doppler: Transvalvular velocity was within the normal range. There was no evidence for stenosis. Trivial regurgitation. Valve area by pressure half-time: 8.46cm^2. Indexed valve area by pressure half-time: 4.83cm^2/m^2. Valve area by continuity equation (using LVOT flow): 4.64cm^2. Indexed valve area by continuity equation (using LVOT flow): 2.65cm^2/m^2. Mean gradient: 1mm Hg (D).  ------------------------------------------------------------ Left atrium: LA Volume/BSA 26.9 ml/m2. The atrium was at the upper limits of normal in size.  ------------------------------------------------------------ Right ventricle: The cavity size was normal. Wall thickness was normal. Systolic function was normal.  ------------------------------------------------------------ Pulmonic valve: Poorly visualized. The valve appears to be grossly normal.  ------------------------------------------------------------ Tricuspid valve: Structurally normal valve. Leaflet separation was normal. Doppler: Transvalvular velocity was within the normal range. No regurgitation.  ------------------------------------------------------------ Pulmonary artery: Systolic pressure was within the  normal range.  ------------------------------------------------------------ Right atrium: The atrium was normal in size.  ------------------------------------------------------------ Pericardium: There was no pericardial effusion.  ------------------------------------------------------------ Systemic veins: Inferior vena cava: The vessel was normal in size; the respirophasic diameter changes were in the normal range (= 50%); findings are consistent with normal central venous pressure.  ------------------------------------------------------------ Post procedure conclusions Ascending Aorta:  - The aorta was normal, not dilated, and non-diseased.  ------------------------------------------------------------  2D measurements Normal Doppler measurements Normal Left ventricle Main pulmonary LVID ED, 40.2 mm 43-52 artery chord, Pressure, 28 mm Hg =30 PLAX S LVID ES, 33 mm 23-38 Left ventricle chord, Ea, lat 7.4 cm/s ------ PLAX ann, tiss FS, chord, 18 % >29 DP PLAX E/Ea, lat 5.68 ------ LVPW, ED 14.8 mm ------ ann, tiss IVS/LVPW 1.12 <1.3 DP ratio, ED Ea, med 7.7 cm/s ------ Ventricular septum ann, tiss IVS, ED 16.6 mm ------ DP LVOT E/Ea, med 5.45 ------ Diam, S 27 mm ------ ann, tiss Area 5.73 cm^2 ------ DP Aorta Aortic valve Root diam, 38 mm ------ Peak vel, 379 cm/s ------ ED S Left atrium Mean vel, 301 cm/s ------ AP dim 38 mm ------ S AP dim 2.17 cm/m^2 <2.2 VTI, S 75.6 cm ------ index Mean 39 mm Hg ------ gradient, M-mode measurements Normal S Mitral valve Peak 58 mm Hg ------ E-septal 16 mm ------ gradient, separation S Area, VTI 1.02 cm^2 ------ Area index 0.58 cm^2/m ------ (VTI) ^2 Area, Vmax 0.98 cm^2 ------ Area index 0.56 cm^2/m ------ (Vmax) ^2 Mitral valve Peak E vel 42 cm/s ------ Peak A vel 82.9 cm/s ------ Mean vel, 39.3 cm/s ------ D Decelerati 148 ms 150-23 on time 0 Pressure 26 ms ------ half-time Mean 1 mm Hg  ------ gradient, D Peak E/A 0.5 ------ ratio Area (PHT) 8.46 cm^2 ------ Area index 4.83 cm^2/m ------ (  PHT) ^2 Area 4.64 cm^2 ------ (LVOT) continuity Area index 2.65 cm^2/m ------ (LVOT ^2 cont) Annulus 17.3 cm ------ VTI Tricuspid valve Regurg 241 cm/s ------ peak vel Peak RV-RA 23 mm Hg ------ gradient, S Max regurg 241 cm/s ------ vel Systemic veins Estimated 5 mm Hg ------ CVP Right ventricle Pressure, 28 mm Hg <30 S Sa vel, 17.4 cm/s ------ lat ann, tiss DP  ------------------------------------------------------------ Prepared and Electronically Authenticated by  Croitoru, Mihai 2014-05-06T13:20:20.200      Recent Radiology Findings:  Dg Chest 2 View  02/22/2013   *RADIOLOGY REPORT*  Clinical Data: Dyspnea.  Rheumatoid arthritis.  CHEST - 2 VIEW  Comparison: 11/11/2012.  Findings: The cardiac silhouette, mediastinal and hilar contours are within normal limits and stable.  Stable eventration of the hemidiaphragms and prominent epicardial fat.  The lungs are clear acute process.  No pleural effusion.  The bony thorax is intact.  IMPRESSION: No acute cardiopulmonary findings.   Original Report Authenticated By: Rudie Meyer, M.D.   Ct Head Wo Contrast  02/09/2013   *RADIOLOGY REPORT*  Clinical Data: Fall.  Trauma to back of head.  Laceration. Hematoma.  Occipital pain.  CT HEAD WITHOUT CONTRAST  Technique:  Contiguous axial images were obtained from the base of the skull through the vertex without contrast.  Comparison: None.  Findings: No acute cortical infarct, hemorrhage, mass lesion is present.  Atrophy and white matter disease is similar to the prior study.  The ventricles are of normal size.  No significant extra- axial fluid collection is present.  A prominent right parietal scalp hematoma and laceration is evident.  There is no underlying fracture.  No foreign body is evident.  IMPRESSION:  1.  Prominent right parietal scalp laceration and hematoma near the  vertex without an underlying fracture. 2.  Stable atrophy and white matter disease. 3.  No acute intracranial abnormality.   Original Report Authenticated By: Marin Roberts, M.D.     Recent Lab Findings: Lab Results  Component Value Date   WBC 10.7* 03/16/2013   HGB 14.2 03/16/2013   HCT 42.4 03/16/2013   PLT 225 03/16/2013   GLUCOSE 106* 03/16/2013   ALT 11 01/10/2013   AST 24 01/10/2013   NA 140 03/16/2013   K 4.4 03/16/2013   CL 102 03/16/2013   CREATININE 0.85 03/16/2013   BUN 15 03/16/2013   CO2 30 03/16/2013   TSH 1.755 03/16/2013   INR 0.89 03/16/2013   Dr Rennis Golden reports BNP in his office 92  Assessment / Plan:     Moderate to severe aortic stenosis with dyspnea symptoms but potentially may be at a proportion to the degree of aortic stenosis, it is unclear if her symptoms are related to congestive heart failure but she has a remarkably low BNP. I discussed with her the risks and procedure for aortic valve replacement.  The patient's cath films were reviewed, she has no evidence of need for any artery bypass graft. Because of her underlying pulmonary disease and frail condition, I discussed with her and her family the possible use of a transcatheter valve treatment of her aortic stenosis. I've asked Dr Cornelius Moras and Excell Seltzer to review her case and see her in regard to possible transcatheter valve treatment. Dr. Cornelius Moras is in the office today and will see her while she and her family are here.      Delight Ovens MD  Beeper [phone] Office [phone] 03/26/2013 4:33 PM

## 2013-03-30 ENCOUNTER — Ambulatory Visit (HOSPITAL_COMMUNITY)
Admission: RE | Admit: 2013-03-30 | Discharge: 2013-03-30 | Disposition: A | Discharge status: Home / Self Care | Payer: Medicare Other | Source: Ambulatory Visit | Attending: Thoracic Surgery (Cardiothoracic Vascular Surgery) | Admitting: Thoracic Surgery (Cardiothoracic Vascular Surgery)

## 2013-03-30 DIAGNOSIS — K449 Diaphragmatic hernia without obstruction or gangrene: Secondary | ICD-10-CM | POA: Insufficient documentation

## 2013-03-30 DIAGNOSIS — I517 Cardiomegaly: Secondary | ICD-10-CM | POA: Insufficient documentation

## 2013-03-30 DIAGNOSIS — R918 Other nonspecific abnormal finding of lung field: Secondary | ICD-10-CM | POA: Insufficient documentation

## 2013-03-30 DIAGNOSIS — I359 Nonrheumatic aortic valve disorder, unspecified: Secondary | ICD-10-CM

## 2013-03-30 DIAGNOSIS — Z9089 Acquired absence of other organs: Secondary | ICD-10-CM | POA: Insufficient documentation

## 2013-03-30 DIAGNOSIS — Z01818 Encounter for other preprocedural examination: Secondary | ICD-10-CM | POA: Insufficient documentation

## 2013-03-30 MED ORDER — METOPROLOL TARTRATE 1 MG/ML IV SOLN
INTRAVENOUS | Status: AC
  Administered 2013-03-30: 5 mg via INTRAVENOUS
  Filled 2013-03-30: qty 10

## 2013-03-30 MED ORDER — IOHEXOL 350 MG/ML SOLN
100.0000 mL | Freq: Once | INTRAVENOUS | Status: AC | PRN
  Administered 2013-03-30: 80 mL via INTRAVENOUS

## 2013-04-06 ENCOUNTER — Ambulatory Visit (INDEPENDENT_AMBULATORY_CARE_PROVIDER_SITE_OTHER): Payer: Medicare Other | Admitting: Internal Medicine

## 2013-04-06 ENCOUNTER — Encounter: Payer: Self-pay | Admitting: Internal Medicine

## 2013-04-06 VITALS — BP 126/84 | HR 100 | Ht 64.0 in | Wt 151.9 lb

## 2013-04-06 DIAGNOSIS — R531 Weakness: Secondary | ICD-10-CM

## 2013-04-06 DIAGNOSIS — R002 Palpitations: Secondary | ICD-10-CM

## 2013-04-06 DIAGNOSIS — J8410 Pulmonary fibrosis, unspecified: Secondary | ICD-10-CM

## 2013-04-06 DIAGNOSIS — I350 Nonrheumatic aortic (valve) stenosis: Secondary | ICD-10-CM

## 2013-04-06 DIAGNOSIS — R5381 Other malaise: Secondary | ICD-10-CM

## 2013-04-06 DIAGNOSIS — R079 Chest pain, unspecified: Secondary | ICD-10-CM

## 2013-04-06 DIAGNOSIS — I359 Nonrheumatic aortic valve disorder, unspecified: Secondary | ICD-10-CM

## 2013-04-06 DIAGNOSIS — E119 Type 2 diabetes mellitus without complications: Secondary | ICD-10-CM
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
## 2013-04-06 NOTE — Patient Instructions (Signed)
Your physician recommends that you schedule a follow-up appointment in: 3 months.  

## 2013-04-09 ENCOUNTER — Ambulatory Visit: Payer: Medicare Other | Admitting: Physical Therapy

## 2013-04-09 ENCOUNTER — Ambulatory Visit: Payer: Medicare Other | Attending: Thoracic Surgery (Cardiothoracic Vascular Surgery) | Admitting: Physical Therapy

## 2013-04-09 DIAGNOSIS — IMO0001 Reserved for inherently not codable concepts without codable children: Secondary | ICD-10-CM | POA: Insufficient documentation

## 2013-04-09 DIAGNOSIS — R5381 Other malaise: Secondary | ICD-10-CM | POA: Insufficient documentation

## 2013-04-09 DIAGNOSIS — R269 Unspecified abnormalities of gait and mobility: Secondary | ICD-10-CM | POA: Insufficient documentation

## 2013-04-09 DIAGNOSIS — R5383 Other fatigue: Secondary | ICD-10-CM | POA: Insufficient documentation

## 2013-04-21 ENCOUNTER — Telehealth: Payer: Self-pay | Admitting: *Deleted

## 2013-04-21 NOTE — Telephone Encounter (Signed)
Faxed office notes to [facility] (Attn: Julian Reil, CMA) on 04/21/2013

## 2013-04-22 ENCOUNTER — Encounter: Payer: Self-pay | Admitting: Emergency Medicine

## 2013-04-22 ENCOUNTER — Ambulatory Visit (INDEPENDENT_AMBULATORY_CARE_PROVIDER_SITE_OTHER): Payer: Medicare Other | Admitting: Emergency Medicine

## 2013-04-22 VITALS — BP 100/70 | HR 100 | Temp 97.6°F | Ht 63.0 in | Wt 151.8 lb

## 2013-04-22 DIAGNOSIS — M069 Rheumatoid arthritis, unspecified: Secondary | ICD-10-CM

## 2013-04-22 DIAGNOSIS — J449 Chronic obstructive pulmonary disease, unspecified: Secondary | ICD-10-CM

## 2013-04-22 NOTE — Progress Notes (Signed)
Subjective:    Patient ID: [name], female    DOB: 08/27/1935, 78 y.o.   MRN: [id number]  HPI 78 yo never smoker w hx RA, DM, HTN with diastolic dysfunction, adrenal insufficiency on chronic prednisolone. She has severe AS, LVEF ~55% by TTE performed by Dr Rennis Golden 02/23/13, normal RV size and fxn. She is under consideration for TAVR. She had recent L and R cath without significant CAD. She is referred for pre-operative eval and to assess what is likely multi-factorial DOE. She has undergone PFT 03/17/13 that show moderately severe AFL, normal volumes, decreased DLCO that corrects for her Va. CT scan chest 03/30/13 >> no ILD, few scattered small nodules.    She reports DOE, pre-syncope that occurred with exertion. She has heard wheezing w exertion also, was tried on albuterol without any effect.   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    Review of Systems  Constitutional: Negative for fever and unexpected weight change.  HENT: Positive for sore throat. Negative for ear pain, nosebleeds, congestion, rhinorrhea, sneezing, dental problem, postnasal drip and sinus pressure.   Eyes: Negative for redness and itching.  Respiratory: Positive for cough, shortness of breath and wheezing. Negative for chest tightness.   Cardiovascular: Negative for palpitations and leg swelling.  Gastrointestinal: Negative for nausea and vomiting.  Genitourinary: Negative for dysuria.  Musculoskeletal: Negative for joint swelling.  Skin: Negative for rash.  Neurological: Positive for light-headedness. Negative for headaches.  Hematological: Does not bruise/bleed easily.  Psychiatric/Behavioral: Negative for dysphoric mood. The patient is not nervous/anxious.     Past Medical History  Diagnosis Date  . Osteoporosis   . Rheumatoid arthritis(714.0)   . Murmur, cardiac   . Diabetes mellitus   . Pneumonia   . Anxiety   . Adrenal insufficiency, primary, iatrogenic   . Gait instability   .  Physical deconditioning   . SOBOE (shortness of breath on exertion)     echo 02/23/13-EF 55-60%, possible bicuspid aortic valve with mod to severed stenosis   . Palpitations   . Chest pain     myoview 04/13/04-inferoapical thinning with a suggestion of mild apical ischemia, EF 66%  . Syncope   . Aortic stenosis, severe 02/26/2013  . Pancreatic insufficiency 11/09/2012  . Type II or unspecified type diabetes mellitus without mention of complication, not stated as uncontrolled 10/01/2011  . UTI (lower urinary tract infection) 10/01/2011  . Hearing loss 03/25/2013  . Memory deficit 03/25/2013  . Depression 03/25/2013  . Pulmonary fibrosis 03/25/2013     Family History  Problem Relation Age of Onset  . Stroke Brother   . Cancer - Colon Father   . Cancer Sister     spine?     History   Social History  . Marital Status: Widowed    Spouse Name: N/A    Number of Children: 3  . Years of Education: N/A   Occupational History  . retired     Technical sales engineer of [place]   Social History Main Topics  . Smoking status: Never Smoker   . Smokeless tobacco: Never Used  . Alcohol Use: No  . Drug Use: No  . Sexually Active: No   Other Topics Concern  . Not on file   Social History Narrative  . No narrative on file     Allergies  Allergen Reactions  . Penicillins Hives     Outpatient Prescriptions Prior to Visit  Medication Sig Dispense Refill  . alendronate (FOSAMAX) 70  MG tablet Take 70 mg by mouth every 7 (seven) days. Take with a full glass of water on an empty stomach.[name] on sundays per patient.      . ALPRAZolam (XANAX) 1 MG tablet Take 1 mg by mouth 3 (three) times daily as needed for anxiety.       Marland Kitchen amitriptyline (ELAVIL) 25 MG tablet Take 75 mg by mouth at bedtime.       Marland Kitchen buPROPion (WELLBUTRIN) 75 MG tablet Take 75 mg by mouth every morning.       Marland Kitchen HYDROcodone-acetaminophen (NORCO/VICODIN) 5-325 MG per tablet Take 1-2 tablets by mouth every 8 (eight) hours as needed for pain.      .  mirtazapine (REMERON) 30 MG tablet Take 30 mg by mouth at bedtime.       Bertram Gala Glycol-Propyl Glycol (SYSTANE OP) Apply 1 drop to eye as needed.      . prednisoLONE 5 MG TABS Take 15 mg by mouth daily.      . Vitamin D, Ergocalciferol, (DRISDOL) 50000 UNITS CAPS Take 50,000 Units by mouth every 7 (seven) days. Takes every Sunday per patient.      . furosemide (LASIX) 20 MG tablet Take 20 mg by mouth daily.       No facility-administered medications prior to visit.    03/30/13 --  Comparison: Chest CT 11/12/2011.  Findings:  Mediastinum: Heart size is upper limits of normal. There is no  significant pericardial fluid, thickening or pericardial  calcification. Extensive calcifications of the aortic valve.  Mitral annular calcifications inferiorly. Mild atherosclerosis of  the thoracic aorta. No pathologically enlarged mediastinal or hilar  lymph nodes. A small hiatal hernia. A prominent pericardial fat pad  is noted around the heart, including adjacent to the left  ventricular apex.  Lungs/Pleura: 3 mm nodule in the periphery of the right upper lobe  (image 14 of series 10). The there are a few other scattered 1-2  mm nodules seen throughout the lungs bilaterally which are highly  nonspecific. No larger or suspicious appearing pulmonary nodules  or masses are otherwise noted. Mild scarring in the inferior  aspect of the left lower lobe. No acute consolidative airspace  disease. No pleural effusions.  Upper Abdomen: Status post cholecystectomy.  Musculoskeletal: Healed fractures of the anterior aspects of the  left seventh and eighth ribs, as well as the right seventh rib are  noted. There are no aggressive appearing lytic or blastic lesions  noted in the visualized portions of the skeleton.  IMPRESSION:  1. No unexpected imaging features that should be adversely affect  attempted transapical TAVR procedure.  2. Extensively calcified aortic valve compatible with the  patient's  reported clinical history of severe aortic stenosis.  3. Tiny pulmonary nodules in the lungs bilaterally, largest of  which measures only 3 mm in the right upper lobe. These are highly  nonspecific and favored to be benign. If the patient is at high  risk for bronchogenic carcinoma, follow-up chest CT at 1 year is  recommended. If the patient is at low risk, no follow-up is  needed. This recommendation follows the consensus statement:  Guidelines for Management of Small Pulmonary Nodules Detected on CT  Scans: A Statement from the Fleischner Society as published in  Radiology 2005; 237:395-400.  4. Small hiatal hernia.  5. Healed bilateral rib fractures, as above      Objective:   Physical Exam Filed Vitals:   04/22/13 1348  BP: 100/70  Pulse: 100  Temp: 97.6 F (36.4 C)  TempSrc: Oral  Height: 5\' 3"  (1.6 m)  Weight: 151 lb 12.8 oz (68.856 kg)  SpO2: 92%   Gen: Pleasant, elderly woman, in no distress,  normal affect  ENT: No lesions,  mouth clear,  oropharynx clear, no postnasal drip  Neck: No JVD, no TMG, referred systolic M to neck  Lungs: No use of accessory muscles, clear without rales or rhonchi, no wheezes  Cardiovascular: RRR, harsh systolic M,   Musculoskeletal: Some mild changes in DIP joints, no cyanosis or clubbing  Neuro: alert, oriented x 3, non-focal exam  Skin: Warm, B LE bruises     Assessment & Plan:  COPD (chronic obstructive pulmonary disease) Your breathing tests show abnormal airflow that suggests asthma or COPD. You may benefit from an inhaled medication to treat this.  Your medical problems and testing place you at high risk to undergo any operation requiring general anesthesia. These risks include prolonged life support, prolonged hospitalization or even death. You will need to discuss these risks with Dr Cornelius Moras as well as the benefits of a procedure before having a valve surgery.  You may benefit from Symbicort 2 puff twice a day. If you decide  to fill this prescription then take if regularly for 3-4 weeks to see how your breathing improves. You ned to rinse your mouth and gargle after taking this medication.    Rheumatoid arthritis(714.0) Your CT scan of the chest does not show significant changes that would be associated with rheumatoid disease.

## 2013-04-22 NOTE — Patient Instructions (Addendum)
Your CT scan of the chest does not show significant changes that would be associated with rheumatoid disease.  Your breathing tests show abnormal airflow that suggests asthma or COPD. You may benefit from an inhaled medication to treat this.  Your medical problems and testing place you at high risk to undergo any operation requiring general anesthesia. These risks include prolonged life support, prolonged hospitalization or even death. You will need to discuss these risks with Dr Cornelius Moras as well as the benefits of a procedure before having a valve surgery.  You may benefit from Symbicort 2 puff twice a day. If you decide to fill this prescription then take if regularly for 3-4 weeks to see how your breathing improves. You ned to rinse your mouth and gargle after taking this medication.  Follow with Dr Delton Coombes as needed

## 2013-05-04 ENCOUNTER — Ambulatory Visit: Payer: Medicare Other | Admitting: Cardiothoracic Surgery

## 2013-05-05 ENCOUNTER — Encounter: Payer: Self-pay | Admitting: Cardiothoracic Surgery

## 2013-05-05 ENCOUNTER — Ambulatory Visit (INDEPENDENT_AMBULATORY_CARE_PROVIDER_SITE_OTHER): Payer: Medicare Other | Admitting: Cardiothoracic Surgery

## 2013-05-05 VITALS — BP 127/85 | HR 109 | Resp 18 | Ht 64.0 in | Wt 152.5 lb

## 2013-05-05 DIAGNOSIS — I350 Nonrheumatic aortic (valve) stenosis: Secondary | ICD-10-CM

## 2013-05-05 DIAGNOSIS — I359 Nonrheumatic aortic valve disorder, unspecified: Secondary | ICD-10-CM

## 2013-05-05 NOTE — Progress Notes (Signed)
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 5 Fr JR4 Catheter was advanced of over a Safety J wire into the ascending Aorta. The catheter was used to  engage the right coronary artery. Multiple cineangiographic views of the right coronary artery system(s) were performed. This catheter was then exchanged over the Standard J wire for an angled Pigtail catheter. Multiple attempts were made to cross the valve that were unsuccessful. The valve was noted by fluoroscopy to be heavily calcified and appeared bicuspid with limited leaflet excursion. The catheter and the wire was removed completely out of the body. The patient was transferred to the holding area where the sheath was removed with  manual pressure held for hemostasis.  Recovery:  The patient was transported to the cath lab holding area in stable condition.  The patient was stable before, during and following the procedure.  Patient did tolerate procedure well.  There were not complications.  EBL: Minimal  Medications:  Premedication: None  Sedation: 0.5 mg IV Versed  Contrast: 70 ml Omnipaque  5 cc 1% lidocaine  Hemodynamics:  Central Aortic Pressure / Mean Aortic Pressure: 118/72  LV Pressure / LV End diastolic Pressure: Not measured  Right Heart Data:  RA - 8  RV - 23/5  PA - 30/7 (19)  PCWP - 9  TPG - 10  FCO/CI - 4.4 L/min, 2.5 L/min  TDCO/CI - 4 L/min, 2.3 L/min  SVR - 21.5 Wood units ( dynscm?5/80)  PVR - 2.5 Wood units ( dynscm?5/80)  PA Sat% - 61  AO Sat% - 88 Coronary Angiographic Data:  Left Main: Large-caliber, angiographically normal  Left Anterior Descending (LAD): Courses anteriorly toward the apex then bifurcates, no significant obstruction  1st diagonal (D1): Smaller vessel without significant disease  Circumflex (LCx): Large vessel, dominant, no significant obstruction with multiple OM branches  1st obtuse marginal: Large vessel which under fills deep to the significant caliber of the circumflex, it appears angiographically patent  2nd obtuse marginal: Medium-sized vessel without significant stenosis  3rd obtuse marginal: Medium-sized distal vessel without significant stenosis  posterior lateral branch: Seen extending from the distal circumflex without obstruction  Right Coronary Artery: Small vessel which is nondominant Impression:  1. no significant obstructive coronary disease. Left dominant circulation.  2. severe aortic stenosis with a heavily calcified aortic valve  3. mildly elevated right heart pressures, low PaO2, no significant congestive left heart failure  4. mildly reduced cardiac output and index by Fick and thermodilution  Plan:  1. further workup of severe aortic  stenosis by CT surgery.  2. Will likely need pulmonary consultation given moderate to severe obstruction and severe diffusion abnormalities on recent prominent function testing  3. continue current medical regimen  The case and results was discussed with the patient and family if available.  The case and results was not discussed with the patient's PCP.  The case and results was discussed with the patient's Cardiologist.  Time Spent Directly with the Patient:  60 minutes  [name], MD, [facility]  Recent Lab Findings: Lab Results  Component Value Date   WBC 10.7* 03/16/2013   HGB 14.2 03/16/2013   HCT 42.4 03/16/2013   PLT 225 03/16/2013   GLUCOSE 106* 03/16/2013   ALT 11 01/10/2013   AST 24 01/10/2013   NA 140 03/16/2013   K 4.4 03/16/2013   CL 102 03/16/2013   CREATININE 0.85 03/16/2013   BUN 15 03/16/2013   CO2 30 03/16/2013   TSH 1.755 03/16/2013   INR 0.89 03/16/2013   Dr Rennis Golden reports BNP in his office 92 PFT's : FEV1 1.18   59%  DLCO 10.31    44%  Assessment /  Plan:   Severe aortic stenosis with dyspnea symptoms. Because of her underlying pulmonary disease and frail condition, I discussed with her and her family the possible use of a transcatheter valve treatment of her aortic stenosis.  Dr Cornelius Moras and Excell Seltzer  reviewed  her case  in regard to possible transcatheter valve treatment. Cardiac CT done has been done and Dr Excell Seltzer & Cornelius Moras felt that the [facility] valve 29 would be too small. I have talked to Dr Italy Hughes who will review the CT in regard to poss use or COR valve #31. If on review of the ct it is felt she could be treated with Cor Valve then we will make her an appointment at [facility].     Delight Ovens MD  Beeper [phone] Office [phone] 05/05/2013 9:25 PM

## 2013-05-27 ENCOUNTER — Ambulatory Visit: Payer: Medicare Other | Admitting: Cardiothoracic Surgery

## 2013-06-01 ENCOUNTER — Other Ambulatory Visit: Payer: Self-pay

## 2013-06-01 DIAGNOSIS — Z1231 Encounter for screening mammogram for malignant neoplasm of breast: Secondary | ICD-10-CM

## 2013-06-29 ENCOUNTER — Ambulatory Visit (INDEPENDENT_AMBULATORY_CARE_PROVIDER_SITE_OTHER): Payer: Medicare Other | Admitting: Internal Medicine

## 2013-06-29 ENCOUNTER — Ambulatory Visit
Admission: RE | Admit: 2013-06-29 | Discharge: 2013-06-29 | Disposition: A | Discharge status: Home / Self Care | Payer: Medicare Other | Source: Ambulatory Visit

## 2013-06-29 ENCOUNTER — Encounter: Payer: Self-pay | Admitting: Internal Medicine

## 2013-06-29 VITALS — BP 138/70 | HR 98 | Ht 64.0 in | Wt 153.6 lb

## 2013-06-29 DIAGNOSIS — R079 Chest pain, unspecified: Secondary | ICD-10-CM

## 2013-06-29 DIAGNOSIS — R531 Weakness: Secondary | ICD-10-CM

## 2013-06-29 DIAGNOSIS — I350 Nonrheumatic aortic (valve) stenosis: Secondary | ICD-10-CM

## 2013-06-29 DIAGNOSIS — J189 Pneumonia, unspecified organism: Secondary | ICD-10-CM

## 2013-06-29 DIAGNOSIS — Z1231 Encounter for screening mammogram for malignant neoplasm of breast: Secondary | ICD-10-CM

## 2013-06-29 DIAGNOSIS — I359 Nonrheumatic aortic valve disorder, unspecified: Secondary | ICD-10-CM

## 2013-06-29 DIAGNOSIS — R5381 Other malaise: Secondary | ICD-10-CM

## 2013-06-29 DIAGNOSIS — J8410 Pulmonary fibrosis, unspecified: Secondary | ICD-10-CM
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
## 2013-06-29 NOTE — Patient Instructions (Addendum)
Your physician wants you to follow-up in:  6 months. You will receive a reminder letter in the mail two months in advance. If you don't receive a letter, please call our office to schedule the follow-up appointment.   

## 2013-07-20 DIAGNOSIS — J449 Chronic obstructive pulmonary disease, unspecified: Secondary | ICD-10-CM | POA: Insufficient documentation

## 2013-07-20 NOTE — Assessment & Plan Note (Signed)
Your breathing tests show abnormal airflow that suggests asthma or COPD. You may benefit from an inhaled medication to treat this.  Your medical problems and testing place you at high risk to undergo any operation requiring general anesthesia. These risks include prolonged life support, prolonged hospitalization or even death. You will need to discuss these risks with Dr Cornelius Moras as well as the benefits of a procedure before having a valve surgery.  You may benefit from Symbicort 2 puff twice a day. If you decide to fill this prescription then take if regularly for 3-4 weeks to see how your breathing improves. You ned to rinse your mouth and gargle after taking this medication.

## 2013-07-20 NOTE — Assessment & Plan Note (Signed)
Your CT scan of the chest does not show significant changes that would be associated with rheumatoid disease.

## 2013-09-11 IMAGING — CR DG CHEST 2V
3 series · 3 of 3 positions shown · non-contrast
Comparison: 10/02/2011.

CLINICAL DATA: Coughing for 1 week.  Shortness of breath.
Weakness.

CHEST - 2 VIEW

[w chest lat (1 of 2)]
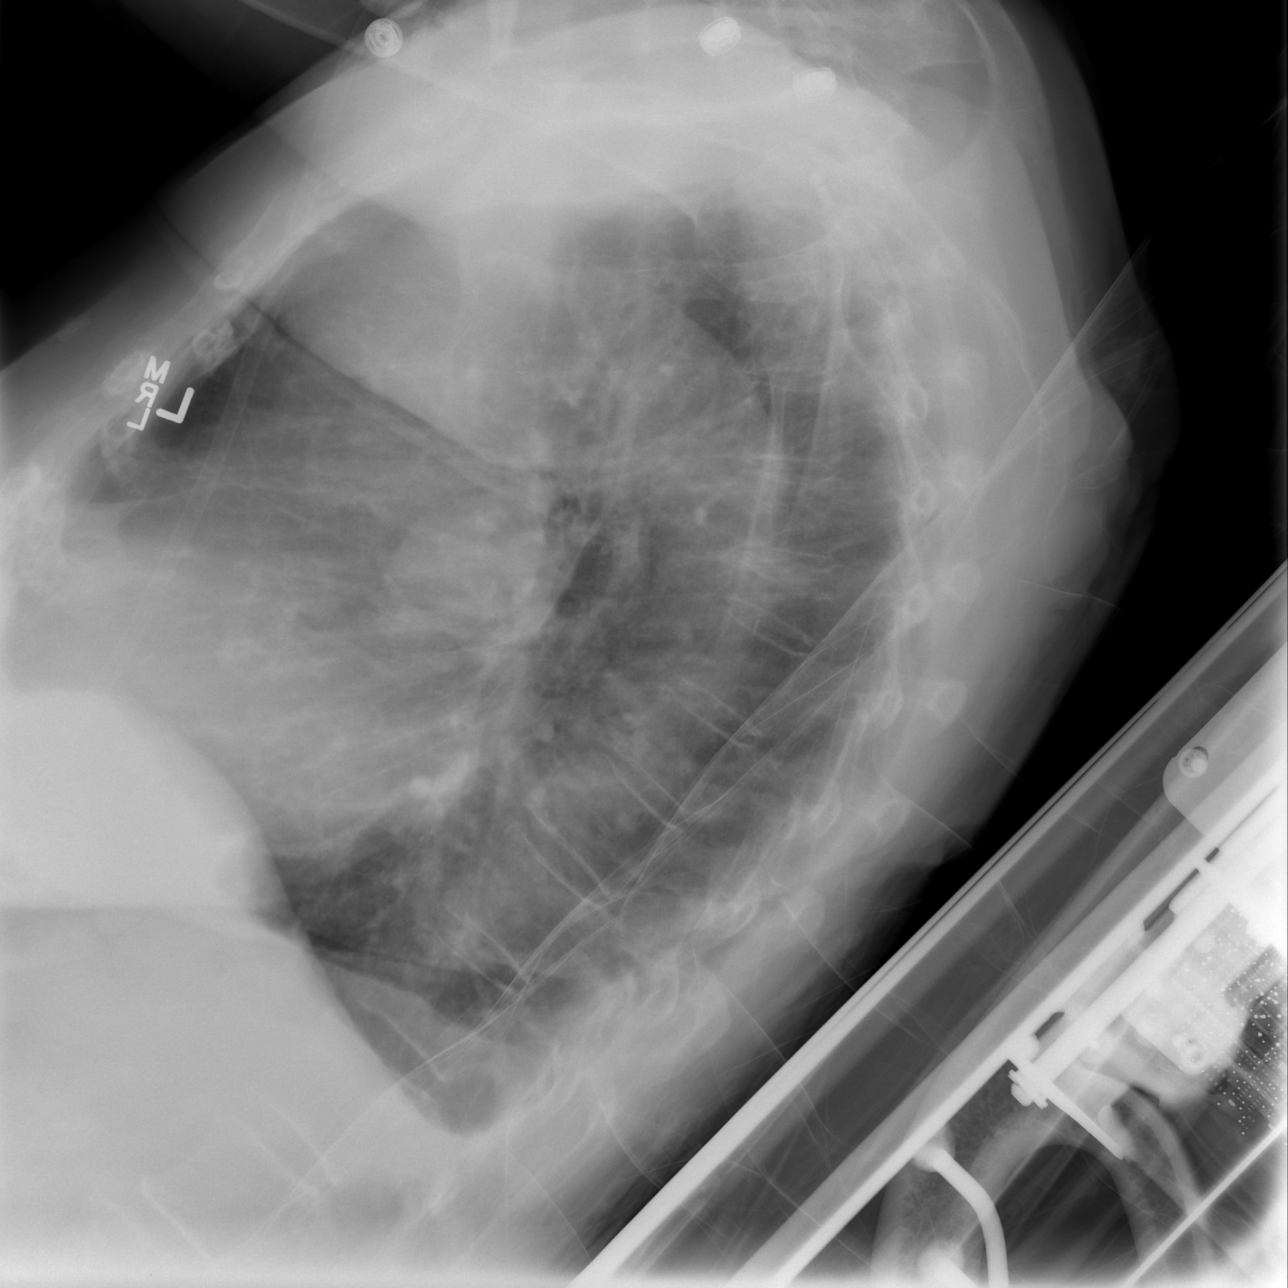

[w chest lat (2 of 2)]
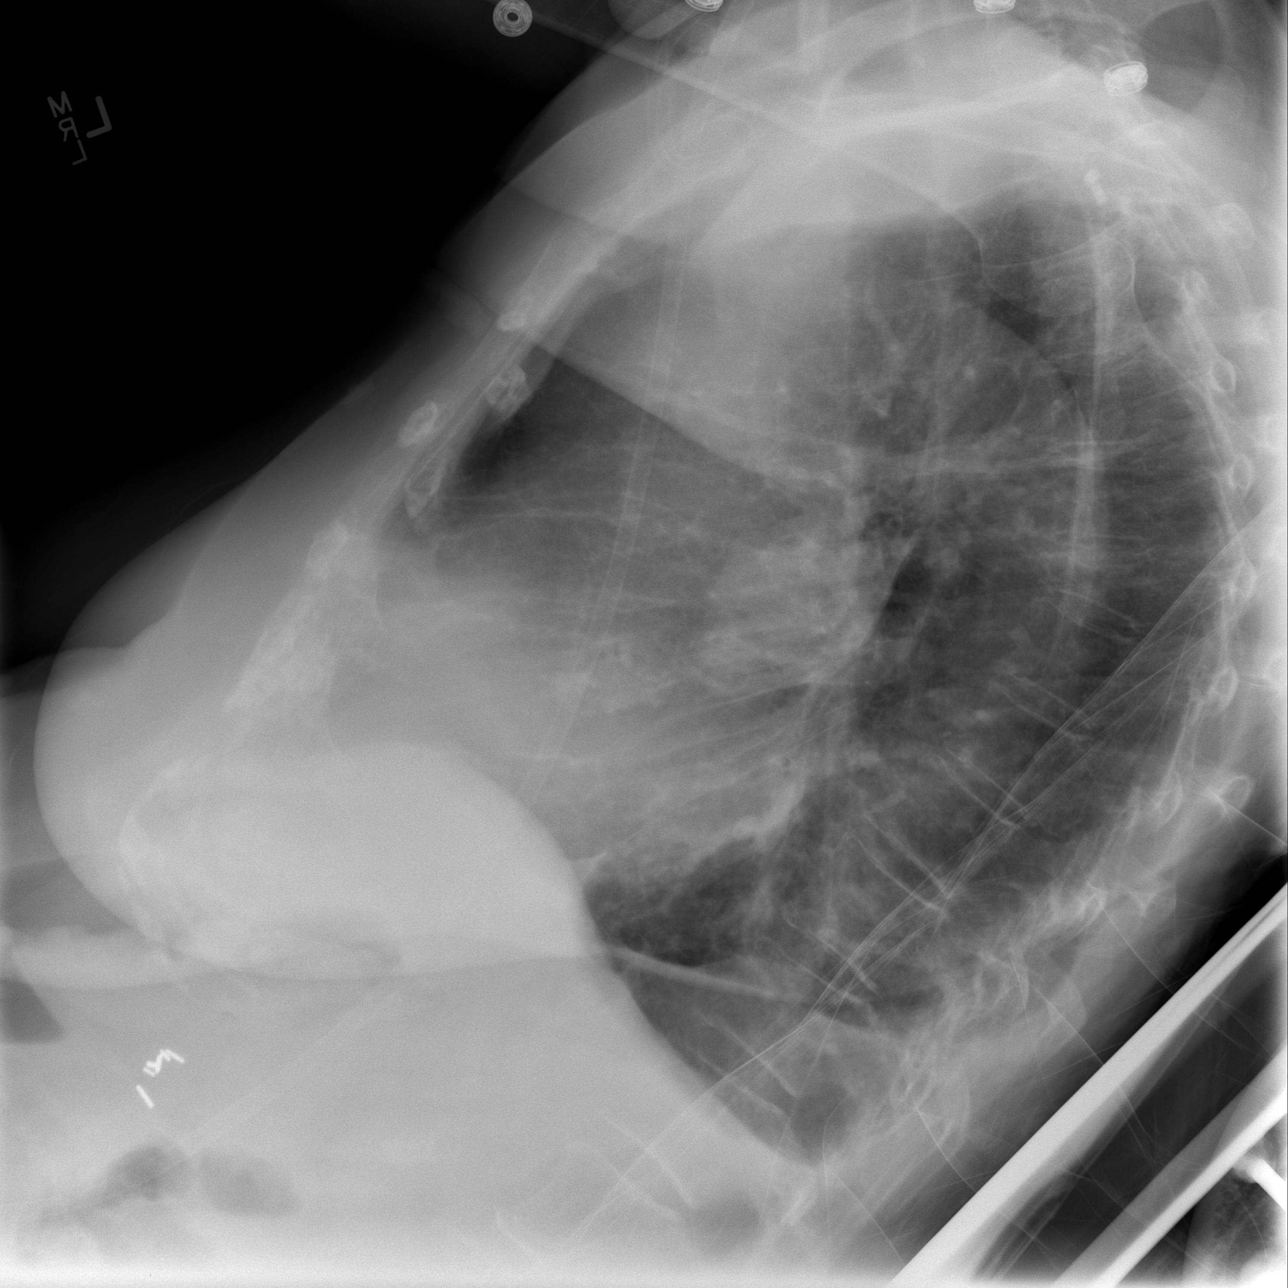

[view not recorded]
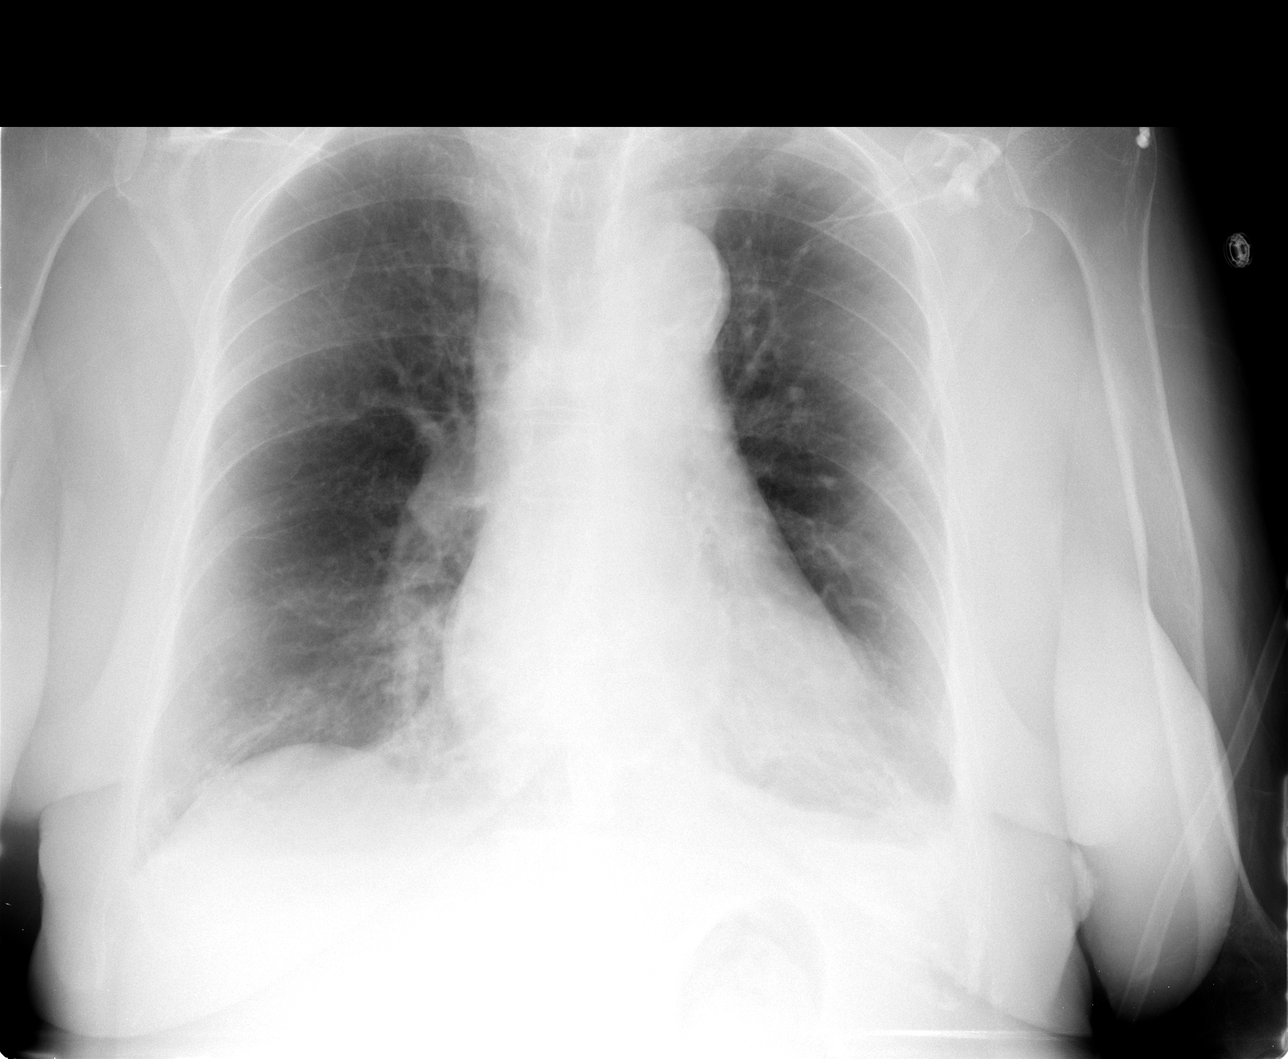

[3 of 3 positions shown; findings below may reference images not displayed]

FINDINGS: Cardiac silhouette is upper range of normal size. Ectasia
and nonaneurysmal calcification of the thoracic aorta are seen.  No
pulmonary edema, pneumonia, or right pleural effusion is seen.  The
patchy infiltrative density in the right base is no longer
definitely evident.  There is slight blunting of the left
costophrenic angle laterally and posteriorly unchanged probably
reflecting chronic pleural thickening rather than fluid.  There is
an overall mild hyperinflation configuration.  There is osteopenic
appearance of bones.  Compression fracture of the upper mid
thoracic spine area appears stable.
IMPRESSION: No definite pulmonary edema or pneumonia is evident.  Patchy
infiltrative densities seen in right base on previous study is no
longer definitely visualized.  Overall mild hyperinflation
configuration.  Stable appearance of thoracic spine vertebral body
compression fracture.  Osteopenic appearance of bones.

## 2013-09-15 ENCOUNTER — Emergency Department (HOSPITAL_COMMUNITY): Payer: Medicare Other

## 2013-09-15 ENCOUNTER — Encounter (HOSPITAL_COMMUNITY): Payer: Self-pay | Admitting: Emergency Medicine

## 2013-09-15 ENCOUNTER — Inpatient Hospital Stay (HOSPITAL_COMMUNITY)
Admission: EM | Admit: 2013-09-15 | Discharge: 2013-09-17 | DRG: 872 | Disposition: A | Discharge status: Home / Self Care | Payer: Medicare Other | Attending: Internal Medicine | Admitting: Internal Medicine

## 2013-09-15 DIAGNOSIS — IMO0002 Reserved for concepts with insufficient information to code with codable children: Secondary | ICD-10-CM | POA: Diagnosis present

## 2013-09-15 DIAGNOSIS — J449 Chronic obstructive pulmonary disease, unspecified: Secondary | ICD-10-CM | POA: Diagnosis present

## 2013-09-15 DIAGNOSIS — A419 Sepsis, unspecified organism: Secondary | ICD-10-CM | POA: Diagnosis present

## 2013-09-15 DIAGNOSIS — I2510 Atherosclerotic heart disease of native coronary artery without angina pectoris: Secondary | ICD-10-CM | POA: Diagnosis present

## 2013-09-15 DIAGNOSIS — F3289 Other specified depressive episodes: Secondary | ICD-10-CM | POA: Diagnosis present

## 2013-09-15 DIAGNOSIS — J8410 Pulmonary fibrosis, unspecified: Secondary | ICD-10-CM | POA: Diagnosis present

## 2013-09-15 DIAGNOSIS — Z79899 Other long term (current) drug therapy: Secondary | ICD-10-CM

## 2013-09-15 DIAGNOSIS — Z880 Allergy status to penicillin: Secondary | ICD-10-CM

## 2013-09-15 DIAGNOSIS — A4151 Sepsis due to Escherichia coli [E. coli]: Principal | ICD-10-CM | POA: Diagnosis present

## 2013-09-15 DIAGNOSIS — F329 Major depressive disorder, single episode, unspecified: Secondary | ICD-10-CM | POA: Diagnosis present

## 2013-09-15 DIAGNOSIS — E119 Type 2 diabetes mellitus without complications: Secondary | ICD-10-CM | POA: Diagnosis present

## 2013-09-15 DIAGNOSIS — B9620 Unspecified Escherichia coli [E. coli] as the cause of diseases classified elsewhere: Secondary | ICD-10-CM | POA: Diagnosis present

## 2013-09-15 DIAGNOSIS — M069 Rheumatoid arthritis, unspecified: Secondary | ICD-10-CM | POA: Diagnosis present

## 2013-09-15 DIAGNOSIS — E871 Hypo-osmolality and hyponatremia: Secondary | ICD-10-CM | POA: Diagnosis present

## 2013-09-15 DIAGNOSIS — F411 Generalized anxiety disorder: Secondary | ICD-10-CM | POA: Diagnosis present

## 2013-09-15 DIAGNOSIS — M810 Age-related osteoporosis without current pathological fracture: Secondary | ICD-10-CM | POA: Diagnosis present

## 2013-09-15 DIAGNOSIS — R4182 Altered mental status, unspecified: Secondary | ICD-10-CM

## 2013-09-15 DIAGNOSIS — E876 Hypokalemia: Secondary | ICD-10-CM | POA: Diagnosis present

## 2013-09-15 DIAGNOSIS — T380X5A Adverse effect of glucocorticoids and synthetic analogues, initial encounter: Secondary | ICD-10-CM | POA: Diagnosis present

## 2013-09-15 DIAGNOSIS — Z823 Family history of stroke: Secondary | ICD-10-CM

## 2013-09-15 DIAGNOSIS — I359 Nonrheumatic aortic valve disorder, unspecified: Secondary | ICD-10-CM | POA: Diagnosis present

## 2013-09-15 DIAGNOSIS — Z87440 Personal history of urinary (tract) infections: Secondary | ICD-10-CM

## 2013-09-15 DIAGNOSIS — E2740 Unspecified adrenocortical insufficiency: Secondary | ICD-10-CM | POA: Diagnosis present

## 2013-09-15 DIAGNOSIS — E2749 Other adrenocortical insufficiency: Secondary | ICD-10-CM | POA: Diagnosis present

## 2013-09-15 DIAGNOSIS — N390 Urinary tract infection, site not specified: Secondary | ICD-10-CM | POA: Diagnosis present

## 2013-09-15 DIAGNOSIS — J4489 Other specified chronic obstructive pulmonary disease: Secondary | ICD-10-CM | POA: Diagnosis present

## 2013-09-15 LAB — COMPREHENSIVE METABOLIC PANEL
ALT: 11 U/L (ref 0–35)
AST: 15 U/L (ref 0–37)
Alkaline Phosphatase: 55 U/L (ref 39–117)
GFR calc Af Amer: >90 mL/min (ref >90)
Glucose, Bld: 115 mg/dL — ABNORMAL HIGH (ref 70–99)
Potassium: 3.5 mEq/L (ref 3.5–5.1)
Sodium: 132 mEq/L — ABNORMAL LOW (ref 135–145)
Total Protein: 6.2 g/dL (ref 6.0–8.3)

## 2013-09-15 LAB — CBC WITH DIFFERENTIAL/PLATELET
Basophils Absolute: 0 10*3/uL (ref 0.0–0.1)
Eosinophils Absolute: 0 10*3/uL (ref 0.0–0.7)
Lymphocytes Relative: 8 % — ABNORMAL LOW (ref 12–46)
Lymphs Abs: 1.7 10*3/uL (ref 0.7–4.0)
Neutrophils Relative %: 83 % — ABNORMAL HIGH (ref 43–77)
Platelets: 160 10*3/uL (ref 150–400)
RBC: 4.32 MIL/uL (ref 3.87–5.11)
WBC: 22.6 10*3/uL — ABNORMAL HIGH (ref 4.0–10.5)

## 2013-09-15 LAB — URINALYSIS, ROUTINE W REFLEX MICROSCOPIC
Bilirubin Urine: NEGATIVE
Glucose, UA: NEGATIVE mg/dL
Ketones, ur: NEGATIVE mg/dL
Nitrite: POSITIVE — AB
pH: 8 (ref 5.0–8.0)

## 2013-09-15 MED ORDER — DIPHENHYDRAMINE HCL 50 MG/ML IJ SOLN
12.5000 mg | Freq: Once | INTRAMUSCULAR | Status: AC
  Administered 2013-09-15: 50 mg via INTRAVENOUS
  Filled 2013-09-15: qty 1

## 2013-09-15 MED ORDER — SODIUM CHLORIDE 0.9 % IV BOLUS (SEPSIS)
500.0000 mL | Freq: Once | INTRAVENOUS | Status: AC
  Administered 2013-09-15: 500 mL via INTRAVENOUS

## 2013-09-15 MED ORDER — DEXTROSE 5 % IV SOLN: 1.0000 g | Freq: Once | INTRAVENOUS | Status: DC

## 2013-09-15 MED ORDER — PIPERACILLIN-TAZOBACTAM 3.375 G IVPB
3.3750 g | Freq: Once | INTRAVENOUS | Status: AC
  Administered 2013-09-15: 3.375 g via INTRAVENOUS
  Filled 2013-09-15 (×2): qty 50

## 2013-09-15 MED ORDER — ACETAMINOPHEN 325 MG PO TABS
650.0000 mg | ORAL_TABLET | Freq: Once | ORAL | Status: AC
  Administered 2013-09-15: 650 mg via ORAL
  Filled 2013-09-15: qty 2

## 2013-09-15 NOTE — H&P (Signed)
[name] is an 78 y.o. female.   Chief Complaint: alt ms HPI: [name] is a pleasant 78 yo female who lives alone independently.  Pmhx is as below.  She does have a history of requiring admission for uti.  She is on chronic prednisone for RA for a long time,  7 or 8 years in all.  She had some loss of control of bowels three days ago. Then two  Days ago she felt okay.  Then today, her daughter called her and she was in bed, and confused and she had urine incontinence in the bed.  She does report some dysuria in the last 24 hrs.  No nausea or vomiting.  No sob.  Yesterday she reported a bit of cp for a time, not today.  No blood noted above or below.  Past Medical History  Diagnosis Date  . Osteoporosis   . Rheumatoid arthritis(714.0)   . Murmur, cardiac   . Pneumonia   . Anxiety   . Adrenal insufficiency, primary, iatrogenic   . Gait instability   . Physical deconditioning   . SOBOE (shortness of breath on exertion)     echo 02/23/13-EF 55-60%, possible bicuspid aortic valve with mod to severed stenosis   . Palpitations   . Chest pain     myoview 04/13/04-inferoapical thinning with a suggestion of mild apical ischemia, EF 66%  . Syncope   . Aortic stenosis, severe 02/26/2013  . Pancreatic insufficiency 11/09/2012  . UTI (lower urinary tract infection) 10/01/2011  . Hearing loss 03/25/2013  . Memory deficit 03/25/2013  . Depression 03/25/2013  . Pulmonary fibrosis 03/25/2013  . Coronary artery disease     Past Surgical History  Procedure Laterality Date  . Knee arthroscopy      2 on left  . Cholecystectomy    . Thyroidectomy    . Abdominal hysterectomy    . Cardiac catheterization  11/08/96    no evidence of CAD, nl LV function  . Shoulder surgery Left   Recent Catheter Aortic valve surgery for aortic stenosis at [facility] in 9/14  Family History  Problem Relation Age of Onset  . Stroke Brother   . Cancer - Colon Father   . Cancer Sister     spine?   Social History:  reports that  she has never smoked. She has never used smokeless tobacco. She reports that she does not drink alcohol or use illicit drugs.  Her daughter is with her  Allergies:  Allergies  Allergen Reactions  . Penicillins Hives     (Not in a hospital admission)  Results for orders placed during the hospital encounter of 09/15/13 (from the past 48 hour(s))  CBC WITH DIFFERENTIAL     Status: Abnormal   Collection Time    09/15/13 10:02 PM      Result Value Range   WBC 22.6 (*) 4.0 - 10.5 K/uL   RBC 4.32  3.87 - 5.11 MIL/uL   Hemoglobin 13.6  12.0 - 15.0 g/dL   HCT 16.1  09.6 - 04.5 %   MCV 91.4  78.0 - 100.0 fL   MCH 31.5  26.0 - 34.0 pg   MCHC 34.4  30.0 - 36.0 g/dL   RDW 40.9  81.1 - 91.4 %   Platelets 160  150 - 400 K/uL   Neutrophils Relative % 83 (*) 43 - 77 %   Neutro Abs 18.8 (*) 1.7 - 7.7 K/uL   Lymphocytes Relative 8 (*) 12 - 46 %  Lymphs Abs 1.7  0.7 - 4.0 K/uL   Monocytes Relative 9  3 - 12 %   Monocytes Absolute 2.0 (*) 0.1 - 1.0 K/uL   Eosinophils Relative 0  0 - 5 %   Eosinophils Absolute 0.0  0.0 - 0.7 K/uL   Basophils Relative 0  0 - 1 %   Basophils Absolute 0.0  0.0 - 0.1 K/uL  COMPREHENSIVE METABOLIC PANEL     Status: Abnormal   Collection Time    09/15/13 10:02 PM      Result Value Range   Sodium 132 (*) 135 - 145 mEq/L   Potassium 3.5  3.5 - 5.1 mEq/L   Chloride 94 (*) 96 - 112 mEq/L   CO2 28  19 - 32 mEq/L   Glucose, Bld 115 (*) 70 - 99 mg/dL   BUN 9  6 - 23 mg/dL   Creatinine, Ser 3.08  0.50 - 1.10 mg/dL   Calcium 8.7  8.4 - 65.7 mg/dL   Total Protein 6.2  6.0 - 8.3 g/dL   Albumin 3.3 (*) 3.5 - 5.2 g/dL   AST 15  0 - 37 U/L   ALT 11  0 - 35 U/L   Alkaline Phosphatase 55  39 - 117 U/L   Total Bilirubin 1.0  0.3 - 1.2 mg/dL   GFR calc non Af Amer 85 (*) >90 mL/min   GFR calc Af Amer >90  >90 mL/min   Comment: (NOTE)     The eGFR has been calculated using the CKD EPI equation.     This calculation has not been validated in all clinical situations.      eGFR's persistently <90 mL/min signify possible Chronic Kidney     Disease.  URINALYSIS, ROUTINE W REFLEX MICROSCOPIC     Status: Abnormal   Collection Time    09/15/13 10:11 PM      Result Value Range   Color, Urine YELLOW  YELLOW   APPearance CLOUDY (*) CLEAR   Specific Gravity, Urine 1.012  1.005 - 1.030   pH 8.0  5.0 - 8.0   Glucose, UA NEGATIVE  NEGATIVE mg/dL   Hgb urine dipstick MODERATE (*) NEGATIVE   Bilirubin Urine NEGATIVE  NEGATIVE   Ketones, ur NEGATIVE  NEGATIVE mg/dL   Protein, ur 30 (*) NEGATIVE mg/dL   Urobilinogen, UA 1.0  0.0 - 1.0 mg/dL   Nitrite POSITIVE (*) NEGATIVE   Leukocytes, UA LARGE (*) NEGATIVE  URINE MICROSCOPIC-ADD ON     Status: Abnormal   Collection Time    09/15/13 10:11 PM      Result Value Range   Squamous Epithelial / LPF RARE  RARE   WBC, UA TOO NUMEROUS TO COUNT  <3 WBC/hpf   RBC / HPF 11-20  <3 RBC/hpf   Bacteria, UA MANY (*) RARE  CG4 I-STAT (LACTIC ACID)     Status: None   Collection Time    09/15/13 10:30 PM      Result Value Range   Lactic Acid, Venous 1.59  0.5 - 2.2 mmol/L   Dg Chest Port 1 View  09/15/2013   CLINICAL DATA:  Altered mental status.  Shortness of breath.  EXAM: PORTABLE CHEST - 1 VIEW  COMPARISON:  02/22/2013  FINDINGS: There is a wall stent projected over the lower mediastinum which could be in the heart or esophagus. The heart size and pulmonary vascularity are normal. Calcification of the aorta. Central peribronchial thickening and interstitial changes suggest chronic bronchitis. No focal  airspace disease. No blunting of costophrenic angles. No pneumothorax.  IMPRESSION: Chronic bronchitic changes in the lungs. No evidence of active pulmonary disease.   Electronically Signed   By: Burman Nieves M.D.   On: 09/15/2013 23:06   Meds:  plavix 75 mg daily Prednisone 12.5 mg daily Alprazolam 1 mg po tid Amitriptyline 25 mg 3 pills qhs remeron 30 mg po qhs Hydrocodone /apap 5/325  A couple a day buproprion 75mg   daily Vitamin d3 5000 u daily Lasix unsure dose Lopressor since aortic procedure, unsure dose   ROS:as per hpi  Blood pressure 142/63, pulse 108, temperature 99.8 F (37.7 C), temperature source Rectal, resp. rate 17, weight 66.679 kg (147 lb), SpO2 97.00%.  [name] is pleasant, only mildly confused now,  alert. Lungs are cta bilat, no w/r/r Heart is rrr with 2/6 sem left and rsb, no rub or gallop abd soft, nt, nd, no mass or hsm. Moe times four. No pallor, no icterus. Grossly neurologically intact.  Assessment/Plan 78 yo female chronically imunosuppresed due to RA and prednisone use with uti with high wbc and confusion, possible early urosepsis.  We will admit to tele bed. Treat with zosyn, iv fluids, we will give higher dose prednisone for a time.  We will cont. Other home meds.  Full code status.Marland Kitchen Hopefully she will turn around in the next 24-48 hrs.      Ezequiel Kayser, MD 09/15/2013, 11:50 PM

## 2013-09-15 NOTE — ED Provider Notes (Addendum)
CSN: [id number]     Arrival date & time 09/15/13  2128 History   First MD Initiated Contact with Patient 09/15/13 2146     Chief Complaint  Patient presents with  . Weakness  . Altered Mental Status   (Consider location/radiation/quality/duration/timing/severity/associated sxs/prior Treatment) HPI Comments: Pt states felt fine yesterday and today felt bad all day.  With mild nausea, 1 episode of dysuria and chills.  Today she was in bed at 4pm and incontinent of urine and daughter states it was very foul smelling and her mom was slightly confused which has happened in the past when she gets UTI's which she gets frequently every few months.  She denies cough, SOB, chest pain, headache or other complaints.  The history is provided by the patient and a relative.    Past Medical History  Diagnosis Date  . Osteoporosis   . Rheumatoid arthritis(714.0)   . Murmur, cardiac   . Pneumonia   . Anxiety   . Adrenal insufficiency, primary, iatrogenic   . Gait instability   . Physical deconditioning   . SOBOE (shortness of breath on exertion)     echo 02/23/13-EF 55-60%, possible bicuspid aortic valve with mod to severed stenosis   . Palpitations   . Chest pain     myoview 04/13/04-inferoapical thinning with a suggestion of mild apical ischemia, EF 66%  . Syncope   . Aortic stenosis, severe 02/26/2013  . Pancreatic insufficiency 11/09/2012  . UTI (lower urinary tract infection) 10/01/2011  . Hearing loss 03/25/2013  . Memory deficit 03/25/2013  . Depression 03/25/2013  . Pulmonary fibrosis 03/25/2013  . Coronary artery disease    Past Surgical History  Procedure Laterality Date  . Knee arthroscopy      2 on left  . Cholecystectomy    . Thyroidectomy    . Abdominal hysterectomy    . Cardiac catheterization  11/08/96    no evidence of CAD, nl LV function  . Shoulder surgery Left    Family History  Problem Relation Age of Onset  . Stroke Brother   . Cancer - Colon Father   . Cancer Sister      spine?   History  Substance Use Topics  . Smoking status: Never Smoker   . Smokeless tobacco: Never Used  . Alcohol Use: No   OB History   Grav Para Term Preterm Abortions TAB SAB Ect Mult Living                 Review of Systems  Constitutional: Positive for chills.  Respiratory: Negative for cough and shortness of breath.   Gastrointestinal: Positive for nausea. Negative for vomiting and abdominal pain.  Genitourinary: Positive for dysuria.  Neurological: Positive for weakness.  All other systems reviewed and are negative.    Allergies  Penicillins  Home Medications   Current Outpatient Rx  Name  Route  Sig  Dispense  Refill  . alendronate (FOSAMAX) 70 MG tablet   Oral   Take 70 mg by mouth every 7 (seven) days. Take with a full glass of water on an empty stomach.[name] on sundays per patient.         . ALPRAZolam (XANAX) 1 MG tablet   Oral   Take 1 mg by mouth 3 (three) times daily as needed for anxiety.          Marland Kitchen amitriptyline (ELAVIL) 25 MG tablet   Oral   Take 75 mg by mouth at bedtime.          Marland Kitchen  buPROPion (WELLBUTRIN) 75 MG tablet   Oral   Take 75 mg by mouth every morning.          . furosemide (LASIX) 20 MG tablet   Oral   Take 20 mg by mouth daily.         Marland Kitchen HYDROcodone-acetaminophen (NORCO/VICODIN) 5-325 MG per tablet   Oral   Take 1-2 tablets by mouth every 8 (eight) hours as needed for pain.         . mirtazapine (REMERON) 30 MG tablet   Oral   Take 30 mg by mouth at bedtime.          Bertram Gala Glycol-Propyl Glycol (SYSTANE OP)   Ophthalmic   Apply 1 drop to eye as needed.         . prednisoLONE 5 MG TABS   Oral   Take 15 mg by mouth daily.         . Vitamin D, Ergocalciferol, (DRISDOL) 50000 UNITS CAPS   Oral   Take 50,000 Units by mouth every 7 (seven) days. Takes every Sunday per patient.          BP 136/82  Pulse 122  Temp(Src) 98.3 F (36.8 C)  Resp 20  Wt 147 lb (66.679 kg)  SpO2 95% Physical  Exam  Nursing note and vitals reviewed. Constitutional: She appears well-developed and well-nourished. No distress.  HENT:  Head: Normocephalic and atraumatic.  Mouth/Throat: Oropharynx is clear and moist.  Eyes: Conjunctivae and EOM are normal. Pupils are equal, round, and reactive to light.  Neck: Normal range of motion. Neck supple.  Cardiovascular: Regular rhythm and intact distal pulses.  Tachycardia present.   No murmur heard. Pulmonary/Chest: Effort normal and breath sounds normal. No respiratory distress. She has no wheezes. She has no rales.  Abdominal: Soft. She exhibits no distension. There is no tenderness. There is no rebound and no guarding.  Musculoskeletal: Normal range of motion. She exhibits no edema and no tenderness.  Neurological: She is alert.  Orient to person and place but not to time  Skin: Skin is warm and dry. No rash noted. No erythema.  Psychiatric: She has a normal mood and affect. Her behavior is normal.    ED Course  Procedures (including critical care time) Labs Review Labs Reviewed  CBC WITH DIFFERENTIAL - Abnormal; Notable for the following:    WBC 22.6 (*)    Neutrophils Relative % 83 (*)    Neutro Abs 18.8 (*)    Lymphocytes Relative 8 (*)    Monocytes Absolute 2.0 (*)    All other components within normal limits  COMPREHENSIVE METABOLIC PANEL - Abnormal; Notable for the following:    Sodium 132 (*)    Chloride 94 (*)    Glucose, Bld 115 (*)    Albumin 3.3 (*)    GFR calc non Af Amer 85 (*)    All other components within normal limits  URINALYSIS, ROUTINE W REFLEX MICROSCOPIC - Abnormal; Notable for the following:    APPearance CLOUDY (*)    Hgb urine dipstick MODERATE (*)    Protein, ur 30 (*)    Nitrite POSITIVE (*)    Leukocytes, UA LARGE (*)    All other components within normal limits  URINE MICROSCOPIC-ADD ON - Abnormal; Notable for the following:    Bacteria, UA MANY (*)    All other components within normal limits  URINE  CULTURE  CG4 I-STAT (LACTIC ACID)   Imaging Review Dg Chest Port 1  View  09/15/2013   CLINICAL DATA:  Altered mental status.  Shortness of breath.  EXAM: PORTABLE CHEST - 1 VIEW  COMPARISON:  02/22/2013  FINDINGS: There is a wall stent projected over the lower mediastinum which could be in the heart or esophagus. The heart size and pulmonary vascularity are normal. Calcification of the aorta. Central peribronchial thickening and interstitial changes suggest chronic bronchitis. No focal airspace disease. No blunting of costophrenic angles. No pneumothorax.  IMPRESSION: Chronic bronchitic changes in the lungs. No evidence of active pulmonary disease.   Electronically Signed   By: Burman Nieves M.D.   On: 09/15/2013 23:06    EKG Interpretation    Date/Time:  Wednesday September 15 2013 21:46:06 EST Ventricular Rate:  115 PR Interval:  176 QRS Duration: 105 QT Interval:  348 QTC Calculation: 481 R Axis:   54 Text Interpretation:  Sinus tachycardia Probable left atrial enlargement LVH with secondary repolarization abnormality ST depression, consider ischemia, diffuse lds No significant change since last tracing Confirmed by Anitra Lauth  MD, Dionicio Shelnutt ([id number]) on 09/15/2013 9:48:05 PM            MDM   1. UTI (lower urinary tract infection)   2. Altered mental status     Pt with AMS and urinary incontinence today but some dysuria yesterday and confused today.  Pt tachycardic and disoriented here to time only which daughter states is unusual for her except when she has UTI which are frequent. Pt is tachy on exam but o/w no other findings.  Concern for new UTI causing AMS.  EKG with sinus tachycardia but no other acute changes.  Leukocytosis of 22,000 and UA consistent with UTI.  Will treat with Zosyn due to last urine culture and April being positive for Escherichia coli that was resistant to cephalosporins, Cipro, Bactrim  Insensitive to imipenem, Zosyn and Macrobid and admit due to AMS  for IV abx.  Pt has hx of adrenal insufficiency but still currently taking prednisone.  Blood pressure remains stable and tachycardia improved with fluids.  Discussed with pharmacy and will give benadryl with zosyn but o/w not contraindicated.  Gwyneth Sprout, MD 09/15/13 2252  Gwyneth Sprout, MD 09/15/13 2300  Gwyneth Sprout, MD 09/15/13 1610  Gwyneth Sprout, MD 09/15/13 [id number]

## 2013-09-15 NOTE — ED Notes (Signed)
No code sepsis per Dr Anitra Lauth

## 2013-09-15 NOTE — ED Notes (Signed)
Patient has been incontinent of urine and BM today. The patient reports that she feels "funny" but can not describe. The patient has labored breathing. The patient has had a decline in mental stus and confusion over the last three days.

## 2013-09-15 NOTE — ED Notes (Addendum)
EKG given to EDP, Plunkett,MD. Pt. On cardiac monitor.

## 2013-09-16 LAB — COMPREHENSIVE METABOLIC PANEL
ALT: 9 U/L (ref 0–35)
Albumin: 2.8 g/dL — ABNORMAL LOW (ref 3.5–5.2)
BUN: 9 mg/dL (ref 6–23)
Calcium: 8.1 mg/dL — ABNORMAL LOW (ref 8.4–10.5)
Chloride: 103 mEq/L (ref 96–112)
GFR calc Af Amer: >90 mL/min (ref >90)
GFR calc non Af Amer: 83 mL/min — ABNORMAL LOW (ref >90)
Glucose, Bld: 133 mg/dL — ABNORMAL HIGH (ref 70–99)
Potassium: 3.3 mEq/L — ABNORMAL LOW (ref 3.5–5.1)
Sodium: 138 mEq/L (ref 135–145)
Total Protein: 5.7 g/dL — ABNORMAL LOW (ref 6.0–8.3)

## 2013-09-16 LAB — CBC WITH DIFFERENTIAL/PLATELET
Basophils Absolute: 0 10*3/uL (ref 0.0–0.1)
Eosinophils Relative: 0 % (ref 0–5)
Hemoglobin: 12.8 g/dL (ref 12.0–15.0)
Lymphocytes Relative: 6 % — ABNORMAL LOW (ref 12–46)
Lymphs Abs: 1 10*3/uL (ref 0.7–4.0)
MCV: 92.8 fL (ref 78.0–100.0)
Monocytes Relative: 6 % (ref 3–12)
Neutro Abs: 14.1 10*3/uL — ABNORMAL HIGH (ref 1.7–7.7)
Neutrophils Relative %: 88 % — ABNORMAL HIGH (ref 43–77)
Platelets: 132 10*3/uL — ABNORMAL LOW (ref 150–400)
RBC: 4.14 MIL/uL (ref 3.87–5.11)
RDW: 13.1 % (ref 11.5–15.5)
WBC: 16.1 10*3/uL — ABNORMAL HIGH (ref 4.0–10.5)

## 2013-09-16 LAB — MAGNESIUM: Magnesium: 1.7 mg/dL (ref 1.5–2.5)

## 2013-09-16 MED ORDER — SODIUM CHLORIDE 0.9 % IV SOLN
500.0000 mg | Freq: Three times a day (TID) | INTRAVENOUS | Status: DC
  Administered 2013-09-16 (×2): 500 mg via INTRAVENOUS
  Filled 2013-09-16 (×3): qty 500

## 2013-09-16 MED ORDER — ENOXAPARIN SODIUM 40 MG/0.4ML ~~LOC~~ SOLN
40.0000 mg | SUBCUTANEOUS | Status: DC
  Administered 2013-09-16 – 2013-09-17 (×2): 40 mg via SUBCUTANEOUS
  Filled 2013-09-16 (×2): qty 0.4

## 2013-09-16 MED ORDER — CLOPIDOGREL BISULFATE 75 MG PO TABS
75.0000 mg | ORAL_TABLET | Freq: Every day | ORAL | Status: DC
  Administered 2013-09-16 – 2013-09-17 (×2): 75 mg via ORAL
  Filled 2013-09-16 (×3): qty 1

## 2013-09-16 MED ORDER — AMITRIPTYLINE HCL 75 MG PO TABS
75.0000 mg | ORAL_TABLET | Freq: Every day | ORAL | Status: DC
  Administered 2013-09-16 (×2): 75 mg via ORAL
  Filled 2013-09-16 (×3): qty 1

## 2013-09-16 MED ORDER — ONDANSETRON HCL 4 MG/2ML IJ SOLN: 4.0000 mg | Freq: Four times a day (QID) | INTRAMUSCULAR | Status: DC | PRN

## 2013-09-16 MED ORDER — ACETAMINOPHEN 650 MG RE SUPP: 650.0000 mg | Freq: Four times a day (QID) | RECTAL | Status: DC | PRN

## 2013-09-16 MED ORDER — SODIUM CHLORIDE 0.9 % IV SOLN
INTRAVENOUS | Status: DC
  Administered 2013-09-16 (×2): via INTRAVENOUS

## 2013-09-16 MED ORDER — SODIUM CHLORIDE 0.9 % IV SOLN
500.0000 mg | Freq: Once | INTRAVENOUS | Status: AC
  Administered 2013-09-16: 500 mg via INTRAVENOUS
  Filled 2013-09-16: qty 500

## 2013-09-16 MED ORDER — ONDANSETRON HCL 4 MG PO TABS: 4.0000 mg | ORAL_TABLET | Freq: Four times a day (QID) | ORAL | Status: DC | PRN

## 2013-09-16 MED ORDER — SACCHAROMYCES BOULARDII 250 MG PO CAPS
250.0000 mg | ORAL_CAPSULE | Freq: Two times a day (BID) | ORAL | Status: DC
  Administered 2013-09-16 – 2013-09-17 (×3): 250 mg via ORAL
  Filled 2013-09-16 (×4): qty 1

## 2013-09-16 MED ORDER — PREDNISONE 10 MG PO TABS
10.0000 mg | ORAL_TABLET | Freq: Once | ORAL | Status: AC
  Administered 2013-09-16: 10 mg via ORAL
  Filled 2013-09-16: qty 1

## 2013-09-16 MED ORDER — METOPROLOL TARTRATE 25 MG PO TABS
25.0000 mg | ORAL_TABLET | Freq: Two times a day (BID) | ORAL | Status: DC
  Administered 2013-09-16 – 2013-09-17 (×4): 25 mg via ORAL
  Filled 2013-09-16 (×5): qty 1

## 2013-09-16 MED ORDER — SODIUM CHLORIDE 0.9 % IV SOLN
250.0000 mg | Freq: Four times a day (QID) | INTRAVENOUS | Status: DC
  Filled 2013-09-16: qty 250

## 2013-09-16 MED ORDER — ALPRAZOLAM 1 MG PO TABS: 1.0000 mg | ORAL_TABLET | Freq: Three times a day (TID) | ORAL | Status: DC | PRN

## 2013-09-16 MED ORDER — MIRTAZAPINE 30 MG PO TABS
30.0000 mg | ORAL_TABLET | Freq: Every day | ORAL | Status: DC
  Administered 2013-09-16 (×2): 30 mg via ORAL
  Filled 2013-09-16 (×3): qty 1

## 2013-09-16 MED ORDER — PREDNISONE 5 MG PO TABS
15.0000 mg | ORAL_TABLET | Freq: Every day | ORAL | Status: DC
  Administered 2013-09-16 – 2013-09-17 (×2): 15 mg via ORAL
  Filled 2013-09-16 (×2): qty 3

## 2013-09-16 MED ORDER — BUPROPION HCL 75 MG PO TABS
75.0000 mg | ORAL_TABLET | Freq: Every day | ORAL | Status: DC
  Administered 2013-09-16 – 2013-09-17 (×2): 75 mg via ORAL
  Filled 2013-09-16 (×2): qty 1

## 2013-09-16 MED ORDER — HYDROCODONE-ACETAMINOPHEN 5-325 MG PO TABS: 1.0000 | ORAL_TABLET | ORAL | Status: DC | PRN

## 2013-09-16 MED ORDER — ACETAMINOPHEN 325 MG PO TABS: 650.0000 mg | ORAL_TABLET | Freq: Four times a day (QID) | ORAL | Status: DC | PRN

## 2013-09-16 MED ORDER — SODIUM CHLORIDE 0.9 % IV SOLN
250.0000 mg | Freq: Four times a day (QID) | INTRAVENOUS | Status: DC
  Administered 2013-09-16 – 2013-09-17 (×3): 250 mg via INTRAVENOUS
  Filled 2013-09-16 (×5): qty 250

## 2013-09-16 MED ORDER — POTASSIUM CHLORIDE CRYS ER 20 MEQ PO TBCR
40.0000 meq | EXTENDED_RELEASE_TABLET | Freq: Once | ORAL | Status: AC
  Administered 2013-09-16: 11:00:00 40 meq via ORAL
  Filled 2013-09-16: qty 2

## 2013-09-16 MED ORDER — VITAMIN D 1000 UNITS PO TABS
5000.0000 [IU] | ORAL_TABLET | Freq: Every day | ORAL | Status: DC
  Administered 2013-09-16 – 2013-09-17 (×2): 5000 [IU] via ORAL
  Filled 2013-09-16 (×2): qty 5

## 2013-09-16 NOTE — Progress Notes (Signed)
Subjective: Admitted c another UTI, Urosepsis, leukocytosis, confusion, Chronic Adrenal Suppression due to prednisone use. Rxed c Abx, IVF and bump in pred dosing. No reaction to Zosyn given  Confusion is better and she looks much better No CP or SOB   Objective: Vital signs in last 24 hours: Temp:  [98 F (36.7 C)-99.8 F (37.7 C)] 98 F (36.7 C) (11/27 0407) Pulse Rate:  [77-122] 77 (11/27 0407) Resp:  [17-28] 20 (11/27 0407) BP: (108-149)/(45-82) 108/45 mmHg (11/27 0407) SpO2:  [93 %-99 %] 99 % (11/27 0407) Weight:  [66.679 kg (147 lb)-66.7 kg (147 lb 0.8 oz)] 66.7 kg (147 lb 0.8 oz) (11/27 0407) Weight change:  Last BM Date: 09/15/13  CBG (last 3)  No results found for this basename: GLUCAP,  in the last 72 hours  Intake/Output from previous day:  Intake/Output Summary (Last 24 hours) at 09/16/13 0912 Last data filed at 09/16/13 0900  Gross per 24 hour  Intake    530 ml  Output    950 ml  Net   -420 ml   11/26 0701 - 11/27 0700 In: 410 [P.O.:120; I.V.:190; IV Piggyback:100] Out: 600 [Urine:600]   Physical Exam  General appearance: Sitting up in bed and looking better than expected. Missing some teeth. Eyes: no scleral icterus Throat: oropharynx moist without erythema Resp: Clear/Distant wearing O2 Cardio: Reg, min m GI: soft, non-tender; bowel sounds normal; no masses,  no organomegaly Extremities: no clubbing, cyanosis or edema.  Chronic Venous stasis changes noted   Lab Results:  Recent Labs  09/15/13 2202 09/16/13 0455  NA 132* 138  K 3.5 3.3*  CL 94* 103  CO2 28 26  GLUCOSE 115* 133*  BUN 9 9  CREATININE 0.64 0.67  CALCIUM 8.7 8.1*  MG  --  1.7  PHOS  --  4.1     Recent Labs  09/15/13 2202 09/16/13 0455  AST 15 11  ALT 11 9  ALKPHOS 55 50  BILITOT 1.0 0.8  PROT 6.2 5.7*  ALBUMIN 3.3* 2.8*     Recent Labs  09/15/13 2202 09/16/13 0455  WBC 22.6* 16.1*  NEUTROABS 18.8* 14.1*  HGB 13.6 12.8  HCT 39.5 38.4  MCV 91.4 92.8   PLT 160 132*    Lab Results  Component Value Date   INR 0.89 03/16/2013   INR 0.87 09/16/2011   INR 2.6* 05/12/2008    No results found for this basename: CKTOTAL, CKMB, CKMBINDEX, TROPONINI,  in the last 72 hours  No results found for this basename: TSH, T4TOTAL, FREET3, T3FREE, THYROIDAB,  in the last 72 hours  No results found for this basename: VITAMINB12, FOLATE, FERRITIN, TIBC, IRON, RETICCTPCT,  in the last 72 hours  Micro Results: No results found for this or any previous visit (from the past 240 hour(s)).   Studies/Results: Dg Chest Port 1 View  09/15/2013   CLINICAL DATA:  Altered mental status.  Shortness of breath.  EXAM: PORTABLE CHEST - 1 VIEW  COMPARISON:  02/22/2013  FINDINGS: There is a wall stent projected over the lower mediastinum which could be in the heart or esophagus. The heart size and pulmonary vascularity are normal. Calcification of the aorta. Central peribronchial thickening and interstitial changes suggest chronic bronchitis. No focal airspace disease. No blunting of costophrenic angles. No pneumothorax.  IMPRESSION: Chronic bronchitic changes in the lungs. No evidence of active pulmonary disease.   Electronically Signed   By: Burman Nieves M.D.   On: 09/15/2013 23:06  Medications: Scheduled: . amitriptyline  75 mg Oral QHS  . buPROPion  75 mg Oral Daily  . cholecalciferol  5,000 Units Oral Daily  . clopidogrel  75 mg Oral Q breakfast  . enoxaparin (LOVENOX) injection  40 mg Subcutaneous Q24H  . imipenem-cilastatin  500 mg Intravenous Q8H  . metoprolol tartrate  25 mg Oral BID  . mirtazapine  30 mg Oral QHS  . predniSONE  15 mg Oral Daily  . saccharomyces boulardii  250 mg Oral BID   Continuous: . sodium chloride 50 mL/hr at 09/16/13 0212     Assessment/Plan: Active Problems:   UTI (lower urinary tract infection)  Urosepsis - On Primaxin as she is PCN allergic and zosyn held.  No Reaction to the small amt she received.  Await Cx.   Continue IVF  Confusion - better  Leukocytosis - improving on Rx.  Hypokalemia and improved hyponatremia - Replete K and watch Na.  RA - on Chronic steroids - she received a bump in dose.  Physical Deconditioning - For PT/OT.  She has a walker in room.  Strength already returning  Severe Aortic stenosis S/P TAVR art [facility]  Pancreatic insuff  Memopry Deficits  Depression.  COPD/Pulm Fibrosis - Saw Dr Delton Coombes 04/2013. Inhalers discussed.  They also reviewed CT scan of the chest that did not show significant changes that would be associated with rheumatoid disease.   CAD - Dr Rennis Golden.  No Sxs  ID -  Anti-infectives   Start     Dose/Rate Route Frequency Ordered Stop   09/16/13 0600  imipenem-cilastatin (PRIMAXIN) 500 mg in sodium chloride 0.9 % 100 mL IVPB     500 mg 200 mL/hr over 30 Minutes Intravenous 3 times per day 09/16/13 0304     09/16/13 0030  imipenem-cilastatin (PRIMAXIN) 500 mg in sodium chloride 0.9 % 100 mL IVPB     500 mg 200 mL/hr over 30 Minutes Intravenous  Once 09/16/13 0029 09/16/13 0136   09/15/13 2315  piperacillin-tazobactam (ZOSYN) IVPB 3.375 g     3.375 g 100 mL/hr over 30 Minutes Intravenous  Once 09/15/13 2301 09/15/13 2355   09/15/13 2300  cefTRIAXone (ROCEPHIN) 1 g in dextrose 5 % 50 mL IVPB  Status:  Discontinued     1 g 100 mL/hr over 30 Minutes Intravenous  Once 09/15/13 2256 09/15/13 2301     DVT Prophylaxis  Full code.    LOS: 1 day   Ines Rebel M 09/16/2013, 9:12 AM

## 2013-09-16 NOTE — Progress Notes (Signed)
ANTIBIOTIC CONSULT NOTE - INITIAL  Pharmacy Consult for Primaxin Indication: UTI  Allergies  Allergen Reactions  . Penicillins Hives    Patient Measurements: Height: 5\' 3"  (160 cm) Weight: 147 lb 0.8 oz (66.7 kg) IBW/kg (Calculated) : 52.4 Adjusted Body Weight:   Vital Signs: Temp: 98.3 F (36.8 C) (11/27 0147) Temp src: Oral (11/27 0147) BP: 123/47 mmHg (11/27 0147) Pulse Rate: 98 (11/27 0147) Intake/Output from previous day: 11/26 0701 - 11/27 0700 In: -  Out: 200 [Urine:200] Intake/Output from this shift: Total I/O In: -  Out: 200 [Urine:200]  Labs:  Recent Labs  09/15/13 2202  WBC 22.6*  HGB 13.6  PLT 160  CREATININE 0.64   Estimated Creatinine Clearance: 54.9 ml/min (by C-G formula based on Cr of 0.64). No results found for this basename: VANCOTROUGH, VANCOPEAK, VANCORANDOM, GENTTROUGH, GENTPEAK, GENTRANDOM, TOBRATROUGH, TOBRAPEAK, TOBRARND, AMIKACINPEAK, AMIKACINTROU, AMIKACIN,  in the last 72 hours   Microbiology: No results found for this or any previous visit (from the past 720 hour(s)).  Medical History: Past Medical History  Diagnosis Date  . Osteoporosis   . Rheumatoid arthritis(714.0)   . Murmur, cardiac   . Pneumonia   . Anxiety   . Adrenal insufficiency, primary, iatrogenic   . Gait instability   . Physical deconditioning   . SOBOE (shortness of breath on exertion)     echo 02/23/13-EF 55-60%, possible bicuspid aortic valve with mod to severed stenosis   . Palpitations   . Chest pain     myoview 04/13/04-inferoapical thinning with a suggestion of mild apical ischemia, EF 66%  . Syncope   . Aortic stenosis, severe 02/26/2013  . Pancreatic insufficiency 11/09/2012  . UTI (lower urinary tract infection) 10/01/2011  . Hearing loss 03/25/2013  . Memory deficit 03/25/2013  . Depression 03/25/2013  . Pulmonary fibrosis 03/25/2013  . Coronary artery disease     Medications:  Anti-infectives   Start     Dose/Rate Route Frequency Ordered Stop   09/16/13 0600  imipenem-cilastatin (PRIMAXIN) 500 mg in sodium chloride 0.9 % 100 mL IVPB     500 mg 200 mL/hr over 30 Minutes Intravenous 3 times per day 09/16/13 0304     09/16/13 0030  imipenem-cilastatin (PRIMAXIN) 500 mg in sodium chloride 0.9 % 100 mL IVPB     500 mg 200 mL/hr over 30 Minutes Intravenous  Once 09/16/13 0029 09/16/13 0136   09/15/13 2315  piperacillin-tazobactam (ZOSYN) IVPB 3.375 g     3.375 g 100 mL/hr over 30 Minutes Intravenous  Once 09/15/13 2301 09/15/13 2355   09/15/13 2300  cefTRIAXone (ROCEPHIN) 1 g in dextrose 5 % 50 mL IVPB  Status:  Discontinued     1 g 100 mL/hr over 30 Minutes Intravenous  Once 09/15/13 2256 09/15/13 2301     Assessment: Patient with UTI.  Hx of resistant E. Coli.  Goal of Therapy:  Primaxin dosed based on patient weight and renal function   Plan:  Follow up culture results Primaxin 500mg  iv q8hr  Aleene Davidson Crowford 09/16/2013,3:06 AM

## 2013-09-17 DIAGNOSIS — IMO0002 Reserved for concepts with insufficient information to code with codable children: Secondary | ICD-10-CM | POA: Diagnosis present

## 2013-09-17 LAB — CBC WITH DIFFERENTIAL/PLATELET
Basophils Absolute: 0 10*3/uL (ref 0.0–0.1)
Basophils Relative: 0 % (ref 0–1)
Lymphs Abs: 2.3 10*3/uL (ref 0.7–4.0)
MCHC: 33.6 g/dL (ref 30.0–36.0)
Monocytes Absolute: 1 10*3/uL (ref 0.1–1.0)
Neutro Abs: 9 10*3/uL — ABNORMAL HIGH (ref 1.7–7.7)
Neutrophils Relative %: 73 % (ref 43–77)
Platelets: 152 10*3/uL (ref 150–400)
RDW: 13.3 % (ref 11.5–15.5)

## 2013-09-17 LAB — COMPREHENSIVE METABOLIC PANEL
ALT: 7 U/L (ref 0–35)
Alkaline Phosphatase: 50 U/L (ref 39–117)
CO2: 24 mEq/L (ref 19–32)
Calcium: 8.4 mg/dL (ref 8.4–10.5)
Chloride: 111 mEq/L (ref 96–112)
GFR calc Af Amer: >90 mL/min (ref >90)
GFR calc non Af Amer: 81 mL/min — ABNORMAL LOW (ref >90)
Glucose, Bld: 100 mg/dL — ABNORMAL HIGH (ref 70–99)
Potassium: 3.6 mEq/L (ref 3.5–5.1)
Sodium: 143 mEq/L (ref 135–145)
Total Bilirubin: 0.3 mg/dL (ref 0.3–1.2)

## 2013-09-17 MED ORDER — NITROFURANTOIN MACROCRYSTAL 100 MG PO CAPS: 100.0000 mg | ORAL_CAPSULE | Freq: Two times a day (BID) | ORAL | Status: AC

## 2013-09-17 MED ORDER — CLOPIDOGREL BISULFATE 75 MG PO TABS: 75.0000 mg | ORAL_TABLET | Freq: Every day | ORAL | Status: DC

## 2013-09-17 NOTE — Care Management Note (Signed)
    Page 1 of 1   09/17/2013     4:05:10 PM   CARE MANAGEMENT NOTE 09/17/2013  Patient:  [name], [name]   Account Number:  [id number]  Date Initiated:  09/17/2013  Documentation initiated by:  Lanier Clam  Subjective/Objective Assessment:   78 Y/O F ADMITTED W/ALT MENTAL STATUS.     Action/Plan:   FORM HOME ALONE.   Anticipated DC Date:  09/17/2013   Anticipated DC Plan:  HOME/SELF CARE      DC Planning Services  CM consult      Choice offered to / List presented to:             Status of service:  Completed, signed off Medicare Important Message given?   (If response is "NO", the following Medicare IM given date fields will be blank) Date Medicare IM given:   Date Additional Medicare IM given:    Discharge Disposition:  HOME/SELF CARE  Per UR Regulation:  Reviewed for med. necessity/level of care/duration of stay  If discussed at Long Length of Stay Meetings, dates discussed:    Comments:  09/17/13 Amaka Gluth RN,BSN  NCM [phone] NOTED D/C PLACED, OT-HH,3N1, PT-NO F/U. D/C,& NO HHC ORDERS PLACED.INFORMED NURSE,& CHARGE NURSE.

## 2013-09-17 NOTE — Evaluation (Signed)
Occupational Therapy Evaluation Patient Details Name: [name] MRN: [id number] DOB: 1935-04-06 Today's Date: 09/17/2013 Time: 4098-1191 OT Time Calculation (min): 22 min  OT Assessment / Plan / Recommendation History of present illness [name] is a pleasant 78 yo female who lives alone independently.  Pmhx is as below.  She does have a history of requiring admission for uti.  She is on chronic prednisone for RA for a long time,  7 or 8 years in all.  She had some loss of control of bowels three days ago. Then two  Days ago she felt okay.  Then today, her daughter called her and she was in bed, and confused and she had urine incontinence in the bed.  She does report some dysuria in the last 24 hrs.  No nausea or vomiting.  No sob.  Yesterday she reported a bit of cp for a time, not today.  No blood noted above or below.   Clinical Impression   Pt up with walker to the bathroom with min guard assist. Pt states daughter and her boyfriend can help intermittantly at d/c. Discussed having supervision with shower initially and using tubseat. Pt verbalized understanding. Will follow while on acute.    OT Assessment  Patient needs continued OT Services    Follow Up Recommendations  Home health OT;Supervision - Intermittent    Barriers to Discharge      Equipment Recommendations  3 in 1 bedside comode    Recommendations for Other Services    Frequency  Min 2X/week    Precautions / Restrictions Precautions Precautions: Fall Restrictions Weight Bearing Restrictions: No   Pertinent Vitals/Pain No complaint of    ADL  Eating/Feeding: Independent;Simulated Where Assessed - Eating/Feeding: Chair Grooming: Wash/dry hands;Set up;Simulated Where Assessed - Grooming: Supported sitting Upper Body Bathing: Chest;Right arm;Left arm;Abdomen;Set up;Simulated Where Assessed - Upper Body Bathing: Unsupported sitting Lower Body Bathing: Simulated;Min guard Upper Body Dressing: Simulated;Set  up Where Assessed - Upper Body Dressing: Unsupported sitting Lower Body Dressing: Simulated;Min guard Where Assessed - Lower Body Dressing: Supported sit to stand Toilet Transfer: Performed;Min guard Acupuncturist: Comfort height toilet;Grab bars Toileting - Architect and Hygiene: Simulated;Min guard Where Assessed - Engineer, mining and Hygiene: Sit to stand from 3-in-1 or toilet Equipment Used: Rolling walker ADL Comments: Pt has a tubseat and discussed with pt having assist with showering initially for safety. Pt states daugther can come over and help. She usually does own simple meals with microwave. Pt tending to push up using grab bar next to toilet so discussed getting a 3in1 to have armrest support. Pt agreeable. She states her knees sometimes get painful and stiff so explained the 3in1 can help with safer transfers.     OT Diagnosis: Generalized weakness  OT Problem List: Decreased strength;Decreased knowledge of use of DME or AE OT Treatment Interventions: Self-care/ADL training;DME and/or AE instruction;Therapeutic activities;Patient/family education   OT Goals(Current goals can be found in the care plan section) Acute Rehab OT Goals Patient Stated Goal: return to independence OT Goal Formulation: With patient Time For Goal Achievement: 10/01/13 Potential to Achieve Goals: Good  Visit Information  Last OT Received On: 09/17/13 Assistance Needed: +1 History of Present Illness: [name] is a pleasant 78 yo female who lives alone independently.  Pmhx is as below.  She does have a history of requiring admission for uti.  She is on chronic prednisone for RA for a long time,  7 or 8 years in all.  She had  some loss of control of bowels three days ago. Then two  Days ago she felt okay.  Then today, her daughter called her and she was in bed, and confused and she had urine incontinence in the bed.  She does report some dysuria in the last 24 hrs.  No  nausea or vomiting.  No sob.  Yesterday she reported a bit of cp for a time, not today.  No blood noted above or below.       Prior Functioning     Home Living Family/patient expects to be discharged to:: Private residence Living Arrangements: Alone Available Help at Discharge: Family;Available PRN/intermittently Type of Home: Apartment Home Access: Stairs to enter Entrance Stairs-Number of Steps: 5 then 3. the first set of stairs has a rail. Home Layout: One level Home Equipment: Walker - 2 wheels;Cane - single point;Shower seat Prior Function Level of Independence: Independent with assistive device(s) Communication Communication: No difficulties         Vision/Perception     Cognition  Cognition Arousal/Alertness: Awake/alert Behavior During Therapy: WFL for tasks assessed/performed Overall Cognitive Status: Within Functional Limits for tasks assessed    Extremity/Trunk Assessment Upper Extremity Assessment Upper Extremity Assessment: Overall WFL for tasks assessed     Mobility Bed Mobility Bed Mobility: Supine to Sit Supine to Sit: 5: Supervision;HOB elevated Transfers Transfers: Sit to Stand;Stand to Sit Sit to Stand: 5: Supervision;With upper extremity assist;From bed;From toilet Stand to Sit: 5: Supervision;With upper extremity assist;To chair/3-in-1;To toilet     Exercise     Balance Balance Balance Assessed: Yes Dynamic Standing Balance Dynamic Standing - Level of Assistance: 5: Stand by assistance   End of Session OT - End of Session Equipment Utilized During Treatment: Rolling walker Activity Tolerance: Patient tolerated treatment well Patient left: in chair;with call bell/phone within reach  GO     [name] [phone] 09/17/2013, 10:27 AM

## 2013-09-17 NOTE — Discharge Summary (Signed)
DISCHARGE SUMMARY  SONDA COPPENS  MR#: [id number]  DOB:1935-04-14  Date of Admission: 09/15/2013 Date of Discharge: 09/17/2013  Attending Physician:Porshia Blizzard,W DOUGLAS  Patient's EXB:[id number],WNUUVO G, MD  Consults:  None  Discharge Diagnoses: Active Problems:   Sepsis syndrome secondary to E. coli UTI (urinary tract infection)   Rheumatoid arthritis(714.0)   Type II or unspecified type diabetes mellitus without mention of complication, not stated as uncontrolled   Adrenal insufficiency, primary, iatrogenic   Past Medical History  Diagnosis Date  . Osteoporosis   . Rheumatoid arthritis(714.0)   . Murmur, cardiac   . Pneumonia   . Anxiety   . Adrenal insufficiency, primary, iatrogenic   . Gait instability   . Physical deconditioning   . SOBOE (shortness of breath on exertion)     echo 02/23/13-EF 55-60%, possible bicuspid aortic valve with mod to severed stenosis   . Palpitations   . Chest pain     myoview 04/13/04-inferoapical thinning with a suggestion of mild apical ischemia, EF 66%  . Syncope   . Aortic stenosis, severe 02/26/2013  . Pancreatic insufficiency 11/09/2012  . UTI (lower urinary tract infection) 10/01/2011  . Hearing loss 03/25/2013  . Memory deficit 03/25/2013  . Depression 03/25/2013  . Pulmonary fibrosis 03/25/2013  . Coronary artery disease    Past Surgical History  Procedure Laterality Date  . Knee arthroscopy      2 on left  . Cholecystectomy    . Thyroidectomy    . Abdominal hysterectomy    . Cardiac catheterization  11/08/96    no evidence of CAD, nl LV function  . Shoulder surgery Left     Discharge Medications:   Medication List         ALPRAZolam 1 MG tablet  Commonly known as:  XANAX  Take 1 mg by mouth 3 (three) times daily as needed for anxiety.     amitriptyline 25 MG tablet  Commonly known as:  ELAVIL  Take 75 mg by mouth at bedtime.     buPROPion 75 MG tablet  Commonly known as:  WELLBUTRIN  Take 75 mg by mouth every morning.     clopidogrel 75 MG tablet  Commonly known as:  PLAVIX  Take 1 tablet (75 mg total) by mouth daily with breakfast.     furosemide 20 MG tablet  Commonly known as:  LASIX  Take 20 mg by mouth daily.     metoprolol tartrate 25 MG tablet  Commonly known as:  LOPRESSOR  Take 25 mg by mouth 2 (two) times daily.     mirtazapine 30 MG tablet  Commonly known as:  REMERON  Take 30 mg by mouth at bedtime.     nitrofurantoin 100 MG capsule  Commonly known as:  MACRODANTIN  Take 1 capsule (100 mg total) by mouth 2 (two) times daily.     prednisoLONE 5 MG Tabs tablet  Take 15 mg by mouth daily.     SYSTANE OP  Place 1 drop into both eyes 2 (two) times daily as needed (dry eyes).     Vitamin D3 5000 UNITS Caps  Take 5,000 Units by mouth daily.     VITAMIN K2 PO  Take 1 tablet by mouth daily.        Hospital Procedures: Dg Chest Port 1 View  09/15/2013   CLINICAL DATA:  Altered mental status.  Shortness of breath.  EXAM: PORTABLE CHEST - 1 VIEW  COMPARISON:  02/22/2013  FINDINGS: There is a wall stent projected  over the lower mediastinum which could be in the heart or esophagus. The heart size and pulmonary vascularity are normal. Calcification of the aorta. Central peribronchial thickening and interstitial changes suggest chronic bronchitis. No focal airspace disease. No blunting of costophrenic angles. No pneumothorax.  IMPRESSION: Chronic bronchitic changes in the lungs. No evidence of active pulmonary disease.   Electronically Signed   By: Burman Nieves M.D.   On: 09/15/2013 23:06    History of Present Illness: [name] is a pleasant 78 yo female who lives alone independently. Pmhx is as below. She does have a history of requiring admission for uti. She is on chronic prednisone for RA for a long time, 7 or 8 years in all. She had some loss of control of bowels three days ago. Then two Days ago she felt okay. Then today, her daughter called her and she was in bed, and confused and she  had urine incontinence in the bed. She does report some dysuria in the last 24 hrs. No nausea or vomiting. No sob. Yesterday she reported a bit of cp for a time, not today. No blood noted above or below.   Hospital Course: Ms. Honeyman was admitted to a telemetry bed.  She was treated empirically for sepsis secondary to urinary tract infection with imipenem.  Given her penicillin allergy and prior Cipro resistance.  With this, her fever, leukocytosis, and altered mental status improved.  She is now tolerating a regular diet and ambulating without assistance. Her urine cultures have grown Escherichia coli- sensitivities are pending, but she has had multiple prior culture showing Escherichia coli, which is only sensitive to Macrodantin orally.  She'll be transitioned to Macrodantin to complete a 7 day course.  Day of Discharge Exam BP 135/69  Pulse 85  Temp(Src) 98.2 F (36.8 C) (Oral)  Resp 18  Ht 5\' 3"  (1.6 m)  Wt 67.1 kg (147 lb 14.9 oz)  BMI 26.21 kg/m2  SpO2 96%  Physical Exam: General appearance: alert and no distress  Eyes: no scleral icterus  Throat: oropharynx moist without erythema  Resp: clear to auscultation bilaterally  Cardio: regular rate and rhythm  GI: soft, non-tender; bowel sounds normal; no masses, no organomegaly  Extremities: no clubbing, cyanosis or edema   Discharge Labs:  Recent Labs  09/15/13 2202 09/16/13 0455 09/17/13 0435  NA 132* 138 143  K 3.5 3.3* 3.6  CL 94* 103 111  CO2 28 26 24   GLUCOSE 115* 133* 100*  BUN 9 9 15   CREATININE 0.64 0.67 0.72  CALCIUM 8.7 8.1* 8.4  MG  --  1.7  --   PHOS  --  4.1  --     Recent Labs  09/16/13 0455 09/17/13 0435  AST 11 8  ALT 9 7  ALKPHOS 50 50  BILITOT 0.8 0.3  PROT 5.7* 5.3*  ALBUMIN 2.8* 2.6*    Recent Labs  09/16/13 0455 09/17/13 0435  WBC 16.1* 12.4*  NEUTROABS 14.1* 9.0*  HGB 12.8 12.1  HCT 38.4 36.0  MCV 92.8 93.5  PLT 132* 152   Lab Results  Component Value Date   INR 0.89  03/16/2013   INR 0.87 09/16/2011   INR 2.6* 05/12/2008    Discharge instructions:     Discharge Orders   Future Orders Complete By Expires   Diet - low sodium heart healthy  As directed    Discharge instructions  As directed    Comments:     Call if fever above 101.5, unable  to keep fluids and pills down, burning with urination   Increase activity slowly  As directed       Disposition: to home  Follow-up Appts: Follow-up with Dr. Eloise Harman at [facility] in 1 week.  Call for appointment.  Condition on Discharge: stable  Tests Needing Follow-up: Urine cultures  Signed: Keaisha Sublette,W DOUGLAS 09/17/2013, 1:41 PM

## 2013-09-17 NOTE — Progress Notes (Signed)
Subjective: Feeling better.  Tolerating diet.  Ambulating in room.  Objective: Vital signs in last 24 hours: Temp:  [97.7 F (36.5 C)-98.2 F (36.8 C)] 98.2 F (36.8 C) (11/28 0515) Pulse Rate:  [77-85] 85 (11/28 0515) Resp:  [18-20] 18 (11/28 0515) BP: (111-135)/(60-80) 135/69 mmHg (11/28 0515) SpO2:  [95 %-98 %] 96 % (11/28 0515) Weight:  [67.1 kg (147 lb 14.9 oz)] 67.1 kg (147 lb 14.9 oz) (11/28 0515) Weight change: 0.421 kg (14.9 oz) Last BM Date: 09/15/13  CBG (last 3)  No results found for this basename: GLUCAP,  in the last 72 hours  Intake/Output from previous day: 11/27 0701 - 11/28 0700 In: 240 [P.O.:240] Out: 1650 [Urine:1650] Intake/Output this shift:    General appearance: alert and no distress Eyes: no scleral icterus Throat: oropharynx moist without erythema Resp: clear to auscultation bilaterally Cardio: regular rate and rhythm GI: soft, non-tender; bowel sounds normal; no masses,  no organomegaly Extremities: no clubbing, cyanosis or edema   Lab Results:  Recent Labs  09/15/13 2202 09/16/13 0455 09/17/13 0435  NA 132* 138 143  K 3.5 3.3* 3.6  CL 94* 103 111  CO2 28 26 24   GLUCOSE 115* 133* 100*  BUN 9 9 15   CREATININE 0.64 0.67 0.72  CALCIUM 8.7 8.1* 8.4  MG  --  1.7  --   PHOS  --  4.1  --     Recent Labs  09/16/13 0455 09/17/13 0435  AST 11 8  ALT 9 7  ALKPHOS 50 50  BILITOT 0.8 0.3  PROT 5.7* 5.3*  ALBUMIN 2.8* 2.6*    Recent Labs  09/16/13 0455 09/17/13 0435  WBC 16.1* 12.4*  NEUTROABS 14.1* 9.0*  HGB 12.8 12.1  HCT 38.4 36.0  MCV 92.8 93.5  PLT 132* 152   Lab Results  Component Value Date   INR 0.89 03/16/2013   INR 0.87 09/16/2011   INR 2.6* 05/12/2008   No results found for this basename: CKTOTAL, CKMB, CKMBINDEX, TROPONINI,  in the last 72 hours No results found for this basename: TSH, T4TOTAL, FREET3, T3FREE, THYROIDAB,  in the last 72 hours No results found for this basename: VITAMINB12, FOLATE, FERRITIN,  TIBC, IRON, RETICCTPCT,  in the last 72 hours  Studies/Results: Dg Chest Port 1 View  09/15/2013   CLINICAL DATA:  Altered mental status.  Shortness of breath.  EXAM: PORTABLE CHEST - 1 VIEW  COMPARISON:  02/22/2013  FINDINGS: There is a wall stent projected over the lower mediastinum which could be in the heart or esophagus. The heart size and pulmonary vascularity are normal. Calcification of the aorta. Central peribronchial thickening and interstitial changes suggest chronic bronchitis. No focal airspace disease. No blunting of costophrenic angles. No pneumothorax.  IMPRESSION: Chronic bronchitic changes in the lungs. No evidence of active pulmonary disease.   Electronically Signed   By: Burman Nieves M.D.   On: 09/15/2013 23:06     Medications: Scheduled: . amitriptyline  75 mg Oral QHS  . buPROPion  75 mg Oral Daily  . cholecalciferol  5,000 Units Oral Daily  . clopidogrel  75 mg Oral Q breakfast  . enoxaparin (LOVENOX) injection  40 mg Subcutaneous Q24H  . imipenem-cilastatin  250 mg Intravenous Q6H  . metoprolol tartrate  25 mg Oral BID  . mirtazapine  30 mg Oral QHS  . predniSONE  15 mg Oral Daily  . saccharomyces boulardii  250 mg Oral BID   Continuous: . sodium chloride 50 mL/hr at 09/16/13  2205    Assessment/Plan: Active Problems: 1.  Sepsis syndrome secondary to E. coli UTI (urinary tract infection)- clinically improving with resolution of fever, altered mental status and leukocytosis.  Continue Imipenem pending sensitivities.  If oral alternative is available may be able to discharge later today. 2. Rheumatoid arthritis(714.0)- continue steroids. 3. Type II or unspecified type diabetes mellitus without mention of complication, not stated as uncontrolled- sugars have been acceptable without treatment. 4. Adrenal insufficiency, primary, iatrogenic- continue steroids. 5. S/p recent TAVR- no apparent complications.   6. Disposition- possible discharge later today pending  urine culture results.     LOS: 2 days   Ova Gillentine,W DOUGLAS 09/17/2013, 7:20 AM

## 2013-09-17 NOTE — Evaluation (Signed)
Physical Therapy Evaluation Patient Details Name: [name] MRN: [id number] DOB: December 03, 1934 Today's Date: 09/17/2013 Time: 7829-5621 PT Time Calculation (min): 20 min  PT Assessment / Plan / Recommendation History of Present Illness  [name] is a pleasant 78 yo female who lives alone independently.  Pmhx is as below.  She does have a history of requiring admission for uti.  She is on chronic prednisone for RA for a long time,  7 or 8 years in all.  She had some loss of control of bowels three days ago. Then two  Days ago she felt okay.  Then today, her daughter called her and she was in bed, and confused and she had urine incontinence in the bed.  She does report some dysuria in the last 24 hrs.  No nausea or vomiting.  No sob.  Yesterday she reported a bit of cp for a time, not today.  No blood noted above or below.  Clinical Impression  Patient evaluated by Physical Therapy with no further acute PT needs identified. All education has been completed and the patient has no further questions. Pt ambulated well with RW and traversed stairs.  Pt reports she will have family assist upon return home initially. No follow-up Physial Therapy or equipment needs. PT is signing off. Thank you for this referral.     PT Assessment  Patent does not need any further PT services    Follow Up Recommendations  No PT follow up    Does the patient have the potential to tolerate intense rehabilitation      Barriers to Discharge        Equipment Recommendations  None recommended by PT    Recommendations for Other Services     Frequency      Precautions / Restrictions Precautions Precautions: Fall Restrictions Weight Bearing Restrictions: No   Pertinent Vitals/Pain n/a      Mobility  Bed Mobility Bed Mobility: Not assessed Supine to Sit: 5: Supervision;HOB elevated Transfers Transfers: Sit to Stand;Stand to Sit Sit to Stand: 6: Modified independent (Device/Increase time);With upper  extremity assist;From chair/3-in-1 Stand to Sit: 6: Modified independent (Device/Increase time);To chair/3-in-1;With upper extremity assist Details for Transfer Assistance: no cues required Ambulation/Gait Ambulation/Gait Assistance: 5: Supervision Ambulation Distance (Feet): 400 Feet Assistive device: Rolling walker Ambulation/Gait Assistance Details: pt with steady gait, states hx of falls but none recently and none when using RW, encouraged use of RW for safety Gait Pattern: Within Functional Limits Stairs: Yes Stairs Assistance: 4: Min guard Stairs Assistance Details (indicate cue type and reason): verbal cues for step to pattern for safety, pt reports both legs are "bad", pt used R hand rail ascending and then rail and HHA upon descent, recommended family member assist her with stairs initially for safety and pt agreeable Stair Management Technique: One rail Right;Step to pattern Number of Stairs: 4    Exercises     PT Diagnosis:    PT Problem List:   PT Treatment Interventions:       PT Goals(Current goals can be found in the care plan section) Acute Rehab PT Goals Patient Stated Goal: return to independence PT Goal Formulation: No goals set, d/c therapy  Visit Information  Last PT Received On: 09/17/13 Assistance Needed: +1 History of Present Illness: [name] is a pleasant 78 yo female who lives alone independently.  Pmhx is as below.  She does have a history of requiring admission for uti.  She is on chronic prednisone for RA for a long  time,  7 or 8 years in all.  She had some loss of control of bowels three days ago. Then two  Days ago she felt okay.  Then today, her daughter called her and she was in bed, and confused and she had urine incontinence in the bed.  She does report some dysuria in the last 24 hrs.  No nausea or vomiting.  No sob.  Yesterday she reported a bit of cp for a time, not today.  No blood noted above or below.       Prior Functioning  Home  Living Family/patient expects to be discharged to:: Private residence Living Arrangements: Alone Available Help at Discharge: Family;Available PRN/intermittently Type of Home: Apartment Home Access: Stairs to enter Entrance Stairs-Number of Steps: 5 then 3. the first set of stairs has a rail. Home Layout: One level Home Equipment: Walker - 2 wheels;Cane - single point;Shower seat Prior Function Level of Independence: Independent with assistive device(s) Comments: Pt states she is indepenent with RW and uses cane and rail for stairs outside apt Communication Communication: No difficulties    Cognition  Cognition Arousal/Alertness: Awake/alert Behavior During Therapy: WFL for tasks assessed/performed Overall Cognitive Status: Within Functional Limits for tasks assessed    Extremity/Trunk Assessment Upper Extremity Assessment Upper Extremity Assessment: Overall WFL for tasks assessed Lower Extremity Assessment Lower Extremity Assessment: Overall WFL for tasks assessed   Balance Balance Balance Assessed: Yes Dynamic Standing Balance Dynamic Standing - Level of Assistance: 5: Stand by assistance  End of Session PT - End of Session Activity Tolerance: Patient tolerated treatment well Patient left: in chair;with call bell/phone within reach  GP     [name],[name] 09/17/2013, 11:49 AM [name], PT, DPT 09/17/2013 Pager: [phone]

## 2013-09-18 LAB — URINE CULTURE: Colony Count: >=100000

## 2013-11-09 ENCOUNTER — Encounter (HOSPITAL_COMMUNITY): Payer: Self-pay | Admitting: Emergency Medicine

## 2013-11-09 ENCOUNTER — Inpatient Hospital Stay (HOSPITAL_COMMUNITY): Payer: Medicare Other

## 2013-11-09 ENCOUNTER — Emergency Department (HOSPITAL_COMMUNITY): Payer: Medicare Other

## 2013-11-09 ENCOUNTER — Inpatient Hospital Stay (HOSPITAL_COMMUNITY)
Admission: EM | Admit: 2013-11-09 | Discharge: 2013-11-17 | DRG: 871 | Disposition: A | Discharge status: Skilled Nursing Facility | Payer: Medicare Other | Attending: Internal Medicine | Admitting: Internal Medicine

## 2013-11-09 DIAGNOSIS — R0902 Hypoxemia: Secondary | ICD-10-CM | POA: Diagnosis present

## 2013-11-09 DIAGNOSIS — S2232XA Fracture of one rib, left side, initial encounter for closed fracture: Secondary | ICD-10-CM

## 2013-11-09 DIAGNOSIS — S329XXA Fracture of unspecified parts of lumbosacral spine and pelvis, initial encounter for closed fracture: Secondary | ICD-10-CM

## 2013-11-09 DIAGNOSIS — S2239XA Fracture of one rib, unspecified side, initial encounter for closed fracture: Secondary | ICD-10-CM | POA: Diagnosis not present

## 2013-11-09 DIAGNOSIS — M810 Age-related osteoporosis without current pathological fracture: Secondary | ICD-10-CM | POA: Diagnosis present

## 2013-11-09 DIAGNOSIS — E876 Hypokalemia: Secondary | ICD-10-CM | POA: Diagnosis present

## 2013-11-09 DIAGNOSIS — F329 Major depressive disorder, single episode, unspecified: Secondary | ICD-10-CM | POA: Diagnosis present

## 2013-11-09 DIAGNOSIS — J4489 Other specified chronic obstructive pulmonary disease: Secondary | ICD-10-CM | POA: Diagnosis present

## 2013-11-09 DIAGNOSIS — F3289 Other specified depressive episodes: Secondary | ICD-10-CM | POA: Diagnosis present

## 2013-11-09 DIAGNOSIS — W19XXXA Unspecified fall, initial encounter: Secondary | ICD-10-CM | POA: Diagnosis not present

## 2013-11-09 DIAGNOSIS — S32509A Unspecified fracture of unspecified pubis, initial encounter for closed fracture: Secondary | ICD-10-CM | POA: Diagnosis not present

## 2013-11-09 DIAGNOSIS — R269 Unspecified abnormalities of gait and mobility: Secondary | ICD-10-CM | POA: Diagnosis present

## 2013-11-09 DIAGNOSIS — J449 Chronic obstructive pulmonary disease, unspecified: Secondary | ICD-10-CM

## 2013-11-09 DIAGNOSIS — Z880 Allergy status to penicillin: Secondary | ICD-10-CM

## 2013-11-09 DIAGNOSIS — R5381 Other malaise: Secondary | ICD-10-CM | POA: Diagnosis present

## 2013-11-09 DIAGNOSIS — F411 Generalized anxiety disorder: Secondary | ICD-10-CM | POA: Diagnosis present

## 2013-11-09 DIAGNOSIS — Z79899 Other long term (current) drug therapy: Secondary | ICD-10-CM

## 2013-11-09 DIAGNOSIS — R2681 Unsteadiness on feet: Secondary | ICD-10-CM | POA: Diagnosis present

## 2013-11-09 DIAGNOSIS — J189 Pneumonia, unspecified organism: Secondary | ICD-10-CM

## 2013-11-09 DIAGNOSIS — Z9089 Acquired absence of other organs: Secondary | ICD-10-CM

## 2013-11-09 DIAGNOSIS — A4159 Other Gram-negative sepsis: Principal | ICD-10-CM | POA: Diagnosis present

## 2013-11-09 DIAGNOSIS — E119 Type 2 diabetes mellitus without complications: Secondary | ICD-10-CM

## 2013-11-09 DIAGNOSIS — IMO0001 Reserved for inherently not codable concepts without codable children: Secondary | ICD-10-CM

## 2013-11-09 DIAGNOSIS — I359 Nonrheumatic aortic valve disorder, unspecified: Secondary | ICD-10-CM | POA: Diagnosis present

## 2013-11-09 DIAGNOSIS — M25519 Pain in unspecified shoulder: Secondary | ICD-10-CM | POA: Diagnosis present

## 2013-11-09 DIAGNOSIS — G8929 Other chronic pain: Secondary | ICD-10-CM | POA: Diagnosis present

## 2013-11-09 DIAGNOSIS — M25559 Pain in unspecified hip: Secondary | ICD-10-CM | POA: Diagnosis present

## 2013-11-09 DIAGNOSIS — R531 Weakness: Secondary | ICD-10-CM

## 2013-11-09 DIAGNOSIS — N390 Urinary tract infection, site not specified: Secondary | ICD-10-CM

## 2013-11-09 DIAGNOSIS — Z954 Presence of other heart-valve replacement: Secondary | ICD-10-CM

## 2013-11-09 DIAGNOSIS — R413 Other amnesia: Secondary | ICD-10-CM | POA: Diagnosis present

## 2013-11-09 DIAGNOSIS — R079 Chest pain, unspecified: Secondary | ICD-10-CM

## 2013-11-09 DIAGNOSIS — H9190 Unspecified hearing loss, unspecified ear: Secondary | ICD-10-CM | POA: Diagnosis present

## 2013-11-09 DIAGNOSIS — M069 Rheumatoid arthritis, unspecified: Secondary | ICD-10-CM | POA: Diagnosis present

## 2013-11-09 DIAGNOSIS — I2510 Atherosclerotic heart disease of native coronary artery without angina pectoris: Secondary | ICD-10-CM | POA: Diagnosis present

## 2013-11-09 DIAGNOSIS — R627 Adult failure to thrive: Secondary | ICD-10-CM

## 2013-11-09 DIAGNOSIS — R509 Fever, unspecified: Secondary | ICD-10-CM

## 2013-11-09 DIAGNOSIS — Z87440 Personal history of urinary (tract) infections: Secondary | ICD-10-CM

## 2013-11-09 DIAGNOSIS — K8689 Other specified diseases of pancreas: Secondary | ICD-10-CM | POA: Diagnosis present

## 2013-11-09 DIAGNOSIS — R011 Cardiac murmur, unspecified: Secondary | ICD-10-CM | POA: Diagnosis present

## 2013-11-09 DIAGNOSIS — IMO0002 Reserved for concepts with insufficient information to code with codable children: Secondary | ICD-10-CM

## 2013-11-09 DIAGNOSIS — R5383 Other fatigue: Secondary | ICD-10-CM

## 2013-11-09 DIAGNOSIS — R05 Cough: Secondary | ICD-10-CM

## 2013-11-09 DIAGNOSIS — F32A Depression, unspecified: Secondary | ICD-10-CM

## 2013-11-09 DIAGNOSIS — J8410 Pulmonary fibrosis, unspecified: Secondary | ICD-10-CM | POA: Diagnosis present

## 2013-11-09 DIAGNOSIS — R059 Cough, unspecified: Secondary | ICD-10-CM

## 2013-11-09 DIAGNOSIS — A419 Sepsis, unspecified organism: Secondary | ICD-10-CM

## 2013-11-09 LAB — URINALYSIS, ROUTINE W REFLEX MICROSCOPIC
Glucose, UA: NEGATIVE mg/dL
Ketones, ur: >80 mg/dL — AB
Nitrite: NEGATIVE
Protein, ur: 30 mg/dL — AB
Specific Gravity, Urine: 1.02 (ref 1.005–1.030)
UROBILINOGEN UA: 2 mg/dL — AB (ref 0.0–1.0)
pH: 6.5 (ref 5.0–8.0)

## 2013-11-09 LAB — CG4 I-STAT (LACTIC ACID): Lactic Acid, Venous: 1.38 mmol/L (ref 0.5–2.2)

## 2013-11-09 LAB — BASIC METABOLIC PANEL
BUN: 10 mg/dL (ref 6–23)
CHLORIDE: 93 meq/L — AB (ref 96–112)
CO2: 23 mEq/L (ref 19–32)
Calcium: 9 mg/dL (ref 8.4–10.5)
Creatinine, Ser: 0.6 mg/dL (ref 0.50–1.10)
GFR calc Af Amer: >90 mL/min (ref >90)
GFR, EST NON AFRICAN AMERICAN: 86 mL/min — AB (ref >90)
GLUCOSE: 119 mg/dL — AB (ref 70–99)
POTASSIUM: 4 meq/L (ref 3.7–5.3)
SODIUM: 135 meq/L — AB (ref 137–147)

## 2013-11-09 LAB — GLUCOSE, CAPILLARY: GLUCOSE-CAPILLARY: 114 mg/dL — AB (ref 70–99)

## 2013-11-09 LAB — CBC
HCT: 41.9 % (ref 36.0–46.0)
HEMOGLOBIN: 13.5 g/dL (ref 12.0–15.0)
MCH: 29.8 pg (ref 26.0–34.0)
MCHC: 32.2 g/dL (ref 30.0–36.0)
MCV: 92.5 fL (ref 78.0–100.0)
Platelets: 203 10*3/uL (ref 150–400)
RBC: 4.53 MIL/uL (ref 3.87–5.11)
RDW: 13.4 % (ref 11.5–15.5)
WBC: 13 10*3/uL — AB (ref 4.0–10.5)

## 2013-11-09 LAB — URINE MICROSCOPIC-ADD ON

## 2013-11-09 LAB — POCT I-STAT TROPONIN I: Troponin i, poc: 0 ng/mL (ref 0.00–0.08)

## 2013-11-09 MED ORDER — OSELTAMIVIR PHOSPHATE 75 MG PO CAPS
75.0000 mg | ORAL_CAPSULE | Freq: Two times a day (BID) | ORAL | Status: DC
  Administered 2013-11-09 – 2013-11-10 (×3): 75 mg via ORAL
  Filled 2013-11-09 (×7): qty 1

## 2013-11-09 MED ORDER — ALPRAZOLAM 1 MG PO TABS
1.0000 mg | ORAL_TABLET | Freq: Three times a day (TID) | ORAL | Status: DC | PRN
  Administered 2013-11-11 – 2013-11-17 (×8): 1 mg via ORAL
  Filled 2013-11-09 (×8): qty 1

## 2013-11-09 MED ORDER — SODIUM CHLORIDE 0.9 % IV SOLN
INTRAVENOUS | Status: DC
  Administered 2013-11-09 – 2013-11-15 (×8): via INTRAVENOUS
  Administered 2013-11-17: 100 mL/h via INTRAVENOUS

## 2013-11-09 MED ORDER — ACETAMINOPHEN 650 MG RE SUPP
650.0000 mg | Freq: Once | RECTAL | Status: AC
  Administered 2013-11-09: 650 mg via RECTAL
  Filled 2013-11-09: qty 1

## 2013-11-09 MED ORDER — HYDROCODONE-ACETAMINOPHEN 5-325 MG PO TABS
1.0000 | ORAL_TABLET | ORAL | Status: DC | PRN
  Administered 2013-11-10 (×2): 2 via ORAL
  Administered 2013-11-11 (×3): 1 via ORAL
  Administered 2013-11-12: 2 via ORAL
  Administered 2013-11-12 – 2013-11-16 (×10): 1 via ORAL
  Administered 2013-11-16 – 2013-11-17 (×3): 2 via ORAL
  Filled 2013-11-09: qty 1
  Filled 2013-11-09 (×2): qty 2
  Filled 2013-11-09 (×2): qty 1
  Filled 2013-11-09 (×3): qty 2
  Filled 2013-11-09 (×4): qty 1
  Filled 2013-11-09 (×2): qty 2
  Filled 2013-11-09 (×2): qty 1
  Filled 2013-11-09: qty 2
  Filled 2013-11-09: qty 1

## 2013-11-09 MED ORDER — HEPARIN SODIUM (PORCINE) 5000 UNIT/ML IJ SOLN
5000.0000 [IU] | Freq: Three times a day (TID) | INTRAMUSCULAR | Status: DC
  Administered 2013-11-09 – 2013-11-17 (×21): 5000 [IU] via SUBCUTANEOUS
  Filled 2013-11-09 (×27): qty 1

## 2013-11-09 MED ORDER — AMITRIPTYLINE HCL 75 MG PO TABS
75.0000 mg | ORAL_TABLET | Freq: Every day | ORAL | Status: DC
  Administered 2013-11-09 – 2013-11-16 (×8): 75 mg via ORAL
  Filled 2013-11-09: qty 3
  Filled 2013-11-09 (×9): qty 1

## 2013-11-09 MED ORDER — SODIUM CHLORIDE 0.9 % IJ SOLN
3.0000 mL | Freq: Two times a day (BID) | INTRAMUSCULAR | Status: DC
  Administered 2013-11-12 – 2013-11-16 (×5): 3 mL via INTRAVENOUS

## 2013-11-09 MED ORDER — POLYVINYL ALCOHOL 1.4 % OP SOLN
1.0000 [drp] | Freq: Two times a day (BID) | OPHTHALMIC | Status: DC | PRN
  Administered 2013-11-17: 1 [drp] via OPHTHALMIC
  Filled 2013-11-09 (×2): qty 15

## 2013-11-09 MED ORDER — LEVOFLOXACIN IN D5W 750 MG/150ML IV SOLN
750.0000 mg | Freq: Once | INTRAVENOUS | Status: AC
  Administered 2013-11-09: 750 mg via INTRAVENOUS
  Filled 2013-11-09: qty 150

## 2013-11-09 MED ORDER — LEVOFLOXACIN IN D5W 750 MG/150ML IV SOLN
750.0000 mg | INTRAVENOUS | Status: DC
  Filled 2013-11-09: qty 150

## 2013-11-09 MED ORDER — MIRTAZAPINE 30 MG PO TABS
30.0000 mg | ORAL_TABLET | Freq: Every day | ORAL | Status: DC
Start: 2013-11-09 — End: 2013-11-17
  Administered 2013-11-09 – 2013-11-16 (×8): 30 mg via ORAL
  Filled 2013-11-09 (×11): qty 1

## 2013-11-09 MED ORDER — POLYETHYL GLYCOL-PROPYL GLYCOL 0.4-0.3 % OP SOLN: 1.0000 [drp] | Freq: Two times a day (BID) | OPHTHALMIC | Status: DC | PRN

## 2013-11-09 MED ORDER — INSULIN ASPART 100 UNIT/ML ~~LOC~~ SOLN
0.0000 [IU] | Freq: Three times a day (TID) | SUBCUTANEOUS | Status: DC
  Administered 2013-11-12: 1 [IU] via SUBCUTANEOUS
  Administered 2013-11-12 – 2013-11-13 (×2): 2 [IU] via SUBCUTANEOUS
  Administered 2013-11-15: 1 [IU] via SUBCUTANEOUS

## 2013-11-09 MED ORDER — SODIUM CHLORIDE 0.9 % IV BOLUS (SEPSIS)
1000.0000 mL | Freq: Once | INTRAVENOUS | Status: AC
  Administered 2013-11-09: 1000 mL via INTRAVENOUS

## 2013-11-09 MED ORDER — PREDNISONE 2.5 MG PO TABS
2.5000 mg | ORAL_TABLET | Freq: Every day | ORAL | Status: DC
  Administered 2013-11-10 – 2013-11-17 (×8): 2.5 mg via ORAL
  Filled 2013-11-09 (×9): qty 1

## 2013-11-09 MED ORDER — BUPROPION HCL 75 MG PO TABS
75.0000 mg | ORAL_TABLET | Freq: Every day | ORAL | Status: DC
  Administered 2013-11-10 – 2013-11-17 (×8): 75 mg via ORAL
  Filled 2013-11-09 (×9): qty 1

## 2013-11-09 MED ORDER — VITAMIN D 1000 UNITS PO TABS
5000.0000 [IU] | ORAL_TABLET | Freq: Every day | ORAL | Status: DC
  Administered 2013-11-10 – 2013-11-17 (×8): 5000 [IU] via ORAL
  Filled 2013-11-09 (×8): qty 5

## 2013-11-09 MED ORDER — INSULIN ASPART 100 UNIT/ML ~~LOC~~ SOLN: 0.0000 [IU] | Freq: Every day | SUBCUTANEOUS | Status: DC

## 2013-11-09 NOTE — H&P (Signed)
Hospitalist Admission History and Physical  Patient name: [name] Medical record number: [id number] Date of birth: 1935-07-16 Age: 79 y.o. Gender: female  Primary Care Provider: [name], MD  Chief Complaint: cough, UTI, ? Sepsis  History of Present Illness:This is a 79 y.o. year old female with multiple medical problems including prior hx/o recurrent UTI with secondary sepsis syndrome admission 08/2013, RA on chronic prednisone, aortic stenosis s/p aortic valve replacement, type II diabetes presenting with cough, fever, UTI w/ ? Sepsis syndrome. Pt has had progressive cough over the last 1-2 days. Had tmax of 102 today. Pt denies any dysuria, increased urinary frequency. No wheezing. Mild rhinorrhea and nasal congestion. + flu shot. No myalgias. Pt does report having some L sided rib pain s/p mechanical fall last week. Per daughter, sxs became progressively worse throughout the day and pt was subsequently brought to the ER.  In the ER a code sepsis was initially called as pt presented with TMax 102 with WBC @ 13 and SBPs in 100s. BP improved to 130s with IV fluids. UA was indicative of UTI. CXR negative for infiltrate though pt with signficant cough. No noted rib fracture in setting of pt with recent fall. Pt started on levaquin for UTI and resp coverage. Critical care also consulted.     Patient Active Problem List   Diagnosis Date Noted  . UTI (lower urinary tract infection) 11/09/2013  . Sepsis syndrome 09/17/2013  . COPD (chronic obstructive pulmonary disease) 07/20/2013  . Chest pain 06/29/2013  . Hearing loss 03/25/2013  . Memory deficit 03/25/2013  . Depression 03/25/2013  . Syncope 03/25/2013  . Pulmonary fibrosis   . Aortic stenosis, severe 02/26/2013  . Influenza A 11/12/2012    Class: Acute  . Pancreatic insufficiency 11/09/2012    Class: Chronic  . Low back pain 01/16/2012    Class: Acute  . Gait instability 01/16/2012    Class: Acute  . Sinusitis, acute,  sphenoidal 11/17/2011    Class: Acute  . Pneumonia 11/12/2011    Class: Acute  . Adrenal insufficiency, primary, iatrogenic 11/12/2011    Class: Chronic  . E. coli UTI (urinary tract infection) 10/01/2011  . Fever 10/01/2011  . Cough 10/01/2011  . Failure to thrive in adult 10/01/2011  . Weakness generalized 10/01/2011  . Rheumatoid arthritis(714.0) 10/01/2011  . Type II or unspecified type diabetes mellitus without mention of complication, not stated as uncontrolled 10/01/2011   Past Medical History: Past Medical History  Diagnosis Date  . Osteoporosis   . Rheumatoid arthritis(714.0)   . Murmur, cardiac   . Pneumonia   . Anxiety   . Adrenal insufficiency, primary, iatrogenic   . Gait instability   . Physical deconditioning   . SOBOE (shortness of breath on exertion)     echo 02/23/13-EF 55-60%, possible bicuspid aortic valve with mod to severed stenosis   . Palpitations   . Chest pain     myoview 04/13/04-inferoapical thinning with a suggestion of mild apical ischemia, EF 66%  . Syncope   . Aortic stenosis, severe 02/26/2013  . Pancreatic insufficiency 11/09/2012  . UTI (lower urinary tract infection) 10/01/2011  . Hearing loss 03/25/2013  . Memory deficit 03/25/2013  . Depression 03/25/2013  . Pulmonary fibrosis 03/25/2013  . Coronary artery disease     Past Surgical History: Past Surgical History  Procedure Laterality Date  . Knee arthroscopy      2 on left  . Cholecystectomy    . Thyroidectomy    .  Abdominal hysterectomy    . Cardiac catheterization  11/08/96    no evidence of CAD, nl LV function  . Shoulder surgery Left     Social History: History   Social History  . Marital Status: Widowed    Spouse Name: N/A    Number of Children: 3  . Years of Education: N/A   Occupational History  . retired     Technical sales engineer of [place]   Social History Main Topics  . Smoking status: Never Smoker   . Smokeless tobacco: Never Used  . Alcohol Use: No  . Drug Use: No  . Sexual  Activity: No   Other Topics Concern  . None   Social History Narrative  . None    Family History: Family History  Problem Relation Age of Onset  . Stroke Brother   . Cancer - Colon Father   . Cancer Sister     spine?    Allergies: Allergies  Allergen Reactions  . Penicillins Hives    Current Facility-Administered Medications  Medication Dose Route Frequency Provider Last Rate Last Dose  . 0.9 %  sodium chloride infusion   Intravenous Continuous Doree Albee, MD      . ALPRAZolam Prudy Feeler) tablet 1 mg  1 mg Oral TID PRN Doree Albee, MD      . amitriptyline (ELAVIL) tablet 75 mg  75 mg Oral QHS Doree Albee, MD      . Melene Muller ON 11/10/2013] buPROPion [facility]) tablet 75 mg  75 mg Oral QAC breakfast Doree Albee, MD      . Melene Muller ON 11/10/2013] cholecalciferol (VITAMIN D) tablet 5,000 Units  5,000 Units Oral Daily Doree Albee, MD      . heparin injection 5,000 Units  5,000 Units Subcutaneous Q8H Doree Albee, MD      . HYDROcodone-acetaminophen (NORCO/VICODIN) 5-325 MG per tablet 1-2 tablet  1-2 tablet Oral Q4H PRN Doree Albee, MD      . levofloxacin (LEVAQUIN) IVPB 750 mg  750 mg Intravenous Once Dagmar Hait, MD 100 mL/hr at 11/09/13 1942 750 mg at 11/09/13 1942  . levofloxacin (LEVAQUIN) IVPB 750 mg  750 mg Intravenous Q24H Doree Albee, MD      . mirtazapine (REMERON) tablet 30 mg  30 mg Oral QHS Doree Albee, MD      . Polyethyl Glycol-Propyl Glycol 0.4-0.3 % SOLN 1 drop  1 drop Ophthalmic BID PRN Doree Albee, MD      . Melene Muller ON 11/10/2013] predniSONE (DELTASONE) tablet 2.5 mg  2.5 mg Oral Q breakfast Doree Albee, MD      . sodium chloride 0.9 % injection 3 mL  3 mL Intravenous Q12H Doree Albee, MD       Current Outpatient Prescriptions  Medication Sig Dispense Refill  . ALPRAZolam (XANAX) 1 MG tablet Take 1 mg by mouth 3 (three) times daily as needed for anxiety.       Marland Kitchen amitriptyline (ELAVIL) 25 MG tablet Take 75 mg by mouth at bedtime.       Marland Kitchen  buPROPion (WELLBUTRIN) 75 MG tablet Take 75 mg by mouth every morning.       . Cholecalciferol (VITAMIN D3) 5000 UNITS CAPS Take 5,000 Units by mouth daily.      . furosemide (LASIX) 20 MG tablet Take 20 mg by mouth daily.      . Menaquinone-7 (VITAMIN K2 PO) Take 1 tablet by mouth daily.      . mirtazapine (REMERON) 30 MG tablet Take 30 mg by mouth  at bedtime.       Bertram Gala Glycol-Propyl Glycol (SYSTANE OP) Place 1 drop into both eyes 2 (two) times daily as needed (dry eyes).       . predniSONE (DELTASONE) 5 MG tablet Take 2.5 mg by mouth daily with breakfast.       Review Of Systems: 12 point ROS negative except as noted above in HPI.  Physical Exam: Filed Vitals:   11/09/13 1840  BP: 105/60  Pulse: 117  Temp:   Resp: 23    General: alert and cooperative HEENT: PERRLA, extra ocular movement intact and dry oral mucosa  Heart: S1, S2 normal, no murmur, rub or gallop, regular rate and rhythm Lungs: no wheezes or rales and unlabored breathing Abdomen: abdomen is soft without significant tenderness, masses, organomegaly or guarding Extremities: extremities normal, atraumatic, no cyanosis or edema Skin:no rashes, no ecchymoses Neurology: normal without focal findings  Labs and Imaging: Lab Results  Component Value Date/Time   NA 135* 11/09/2013  6:00 PM   K 4.0 11/09/2013  6:00 PM   CL 93* 11/09/2013  6:00 PM   CO2 23 11/09/2013  6:00 PM   BUN 10 11/09/2013  6:00 PM   CREATININE 0.60 11/09/2013  6:00 PM   CREATININE 0.85 03/16/2013  9:55 AM   GLUCOSE 119* 11/09/2013  6:00 PM   Lab Results  Component Value Date   WBC 13.0* 11/09/2013   HGB 13.5 11/09/2013   HCT 41.9 11/09/2013   MCV 92.5 11/09/2013   PLT 203 11/09/2013   Urinalysis    Component Value Date/Time   COLORURINE AMBER* 11/09/2013 1749   APPEARANCEUR CLOUDY* 11/09/2013 1749   LABSPEC 1.020 11/09/2013 1749   PHURINE 6.5 11/09/2013 1749   GLUCOSEU NEGATIVE 11/09/2013 1749   HGBUR TRACE* 11/09/2013 1749   BILIRUBINUR  SMALL* 11/09/2013 1749   KETONESUR >80* 11/09/2013 1749   PROTEINUR 30* 11/09/2013 1749   UROBILINOGEN 2.0* 11/09/2013 1749   NITRITE NEGATIVE 11/09/2013 1749   LEUKOCYTESUR SMALL* 11/09/2013 1749       Dg Chest 2 View  11/09/2013   CLINICAL DATA:  Shortness of breath, cough, weakness  EXAM: CHEST  2 VIEW  COMPARISON:  Prior radiograph from 09/15/2013  FINDINGS: Vascular stent again projects over the heart. Cardiac and mediastinal silhouettes are unchanged. Atherosclerotic calcifications noted within the aortic arch.  Patient has taken a slightly shallow lung inflation. No focal infiltrate, pulmonary edema, or pleural effusion. No pneumothorax.  Multilevel degenerative changes noted within the visualized spine. Compression deformity within the mid thoracic spine is stable as compared to prior studies. No acute osseous abnormality.  IMPRESSION: No active cardiopulmonary disease.   Electronically Signed   By: Rise Mu M.D.   On: 11/09/2013 18:17     Assessment and Plan: Agata Judie Petit Skirvin is a 79 y.o. year old female presenting with cough, ? Urosepsis   ID: Will continue IV levaquin. Urine culture. Repeat CXR in am. Trend WBC count. Sputum cx. Start on tamiflu w/ resp virus panel. Higher risk for general infection given chronic prednisone use. Currently hemodynamically stable.   Resp: stable on RA. No clinical signs of COPD exacerbation, though with cough. Continue levaquin, though would consider steroid burst if any wheezing ensues or overall resp status worsens. Continue to follow closely.   MSK: palpable L sided lateral rib pain. No rib fx on CXR, though these are not dedicated rib films. Will obtain L rib xray. Higher risk for fracture given chronic prednisone use.  Prn vicodin  for pain. F/u pending imagine.   Endo: SSI. A1C.   Rheum: Continue prednisone.   Neuro/psych: continue regimen.   FEN/GI: heart healthy diet.  Prophylaxis: subq heparin  Disposition: pending further  evaluation  Code Status:Full Code         Doree Albee MD  Pager: [phone]

## 2013-11-09 NOTE — ED Notes (Signed)
Patient family states she had been confused today with urgent urination. She experience and incontinent episode with very foul smelling urine. Incontinence is not normal for the patient. She states that she has a bad cough that prevents her from sleeping. She also complains of pain to the left rib with cough.

## 2013-11-09 NOTE — ED Notes (Signed)
CODE SEPSIS CALLED BY DR Gwendolyn Grant. CARELINK CALLED.Marland KitchenKLJ

## 2013-11-09 NOTE — ED Notes (Signed)
Patient transported to X-ray 

## 2013-11-09 NOTE — ED Notes (Signed)
Dr. Gwendolyn Grant made aware of pts temp.

## 2013-11-09 NOTE — ED Notes (Signed)
Bed: WA10 Expected date:  Expected time:  Means of arrival:  Comments: [facility] 

## 2013-11-09 NOTE — ED Provider Notes (Signed)
CSN: [id number]     Arrival date & time 11/09/13  1719 History   First MD Initiated Contact with Patient 11/09/13 1726     Chief Complaint  Patient presents with  . Cough  . Altered Mental Status  . Urinary Frequency   (Consider location/radiation/quality/duration/timing/severity/associated sxs/prior Treatment) HPI Comments: Patient with acute altered mental status at home today. Associated urinary incontinence.  Patient is a 79 y.o. female presenting with altered mental status. The history is provided by the patient and a relative.  Altered Mental Status Presenting symptoms: confusion   Presenting symptoms: no partial responsiveness and no unresponsiveness   Severity:  Moderate Most recent episode:  Today Episode history:  Single Timing:  Constant Progression:  Unchanged Chronicity:  New Context: not homeless, not a nursing home resident, not a recent change in medication and not a recent illness   Associated symptoms: bladder incontinence, fever and vomiting   Associated symptoms: no abdominal pain, no difficulty breathing, no nausea, no rash, no suicidal behavior and no weakness     Past Medical History  Diagnosis Date  . Osteoporosis   . Rheumatoid arthritis(714.0)   . Murmur, cardiac   . Pneumonia   . Anxiety   . Adrenal insufficiency, primary, iatrogenic   . Gait instability   . Physical deconditioning   . SOBOE (shortness of breath on exertion)     echo 02/23/13-EF 55-60%, possible bicuspid aortic valve with mod to severed stenosis   . Palpitations   . Chest pain     myoview 04/13/04-inferoapical thinning with a suggestion of mild apical ischemia, EF 66%  . Syncope   . Aortic stenosis, severe 02/26/2013  . Pancreatic insufficiency 11/09/2012  . UTI (lower urinary tract infection) 10/01/2011  . Hearing loss 03/25/2013  . Memory deficit 03/25/2013  . Depression 03/25/2013  . Pulmonary fibrosis 03/25/2013  . Coronary artery disease    Past Surgical History  Procedure  Laterality Date  . Knee arthroscopy      2 on left  . Cholecystectomy    . Thyroidectomy    . Abdominal hysterectomy    . Cardiac catheterization  11/08/96    no evidence of CAD, nl LV function  . Shoulder surgery Left    Family History  Problem Relation Age of Onset  . Stroke Brother   . Cancer - Colon Father   . Cancer Sister     spine?   History  Substance Use Topics  . Smoking status: Never Smoker   . Smokeless tobacco: Never Used  . Alcohol Use: No   OB History   Grav Para Term Preterm Abortions TAB SAB Ect Mult Living                 Review of Systems  Constitutional: Positive for fever.  Respiratory: Negative for cough and shortness of breath.   Gastrointestinal: Positive for vomiting. Negative for nausea and abdominal pain.  Genitourinary: Positive for bladder incontinence.  Skin: Negative for rash.  Neurological: Negative for weakness.  Psychiatric/Behavioral: Positive for confusion.  All other systems reviewed and are negative.    Allergies  Penicillins  Home Medications   Current Outpatient Rx  Name  Route  Sig  Dispense  Refill  . ALPRAZolam (XANAX) 1 MG tablet   Oral   Take 1 mg by mouth 3 (three) times daily as needed for anxiety.          Marland Kitchen amitriptyline (ELAVIL) 25 MG tablet   Oral   Take 75 mg  by mouth at bedtime.          Marland Kitchen buPROPion (WELLBUTRIN) 75 MG tablet   Oral   Take 75 mg by mouth every morning.          . Cholecalciferol (VITAMIN D3) 5000 UNITS CAPS   Oral   Take 5,000 Units by mouth daily.         . clopidogrel (PLAVIX) 75 MG tablet   Oral   Take 1 tablet (75 mg total) by mouth daily with breakfast.   30 tablet   6   . furosemide (LASIX) 20 MG tablet   Oral   Take 20 mg by mouth daily.         . Menaquinone-7 (VITAMIN K2 PO)   Oral   Take 1 tablet by mouth daily.         . metoprolol tartrate (LOPRESSOR) 25 MG tablet   Oral   Take 25 mg by mouth 2 (two) times daily.         . mirtazapine  (REMERON) 30 MG tablet   Oral   Take 30 mg by mouth at bedtime.          Bertram Gala Glycol-Propyl Glycol (SYSTANE OP)   Both Eyes   Place 1 drop into both eyes 2 (two) times daily as needed (dry eyes).          . prednisoLONE 5 MG TABS   Oral   Take 15 mg by mouth daily.          BP 146/80  Pulse 133  Temp(Src) 98.3 F (36.8 C) (Oral)  SpO2 94% Physical Exam  Nursing note and vitals reviewed. Constitutional: She is oriented to person, place, and time. She appears well-developed and well-nourished. No distress.  HENT:  Head: Normocephalic and atraumatic.  Eyes: EOM are normal. Pupils are equal, round, and reactive to light.  Neck: Normal range of motion. Neck supple.  Cardiovascular: Normal rate and regular rhythm.  Exam reveals no friction rub.   No murmur heard. Pulmonary/Chest: Effort normal and breath sounds normal. No respiratory distress. She has no wheezes. She has no rales.  Abdominal: Soft. She exhibits no distension. There is no tenderness. There is no rebound.  Musculoskeletal: Normal range of motion. She exhibits no edema.  Neurological: She is alert and oriented to person, place, and time.  Skin: She is not diaphoretic.    ED Course  Procedures (including critical care time) Labs Review Labs Reviewed  CULTURE, BLOOD (ROUTINE X 2)  CULTURE, BLOOD (ROUTINE X 2)  URINE CULTURE  CBC  BASIC METABOLIC PANEL  URINALYSIS, ROUTINE W REFLEX MICROSCOPIC  CG4 I-STAT (LACTIC ACID)   Imaging Review Dg Chest 2 View  11/09/2013   CLINICAL DATA:  Shortness of breath, cough, weakness  EXAM: CHEST  2 VIEW  COMPARISON:  Prior radiograph from 09/15/2013  FINDINGS: Vascular stent again projects over the heart. Cardiac and mediastinal silhouettes are unchanged. Atherosclerotic calcifications noted within the aortic arch.  Patient has taken a slightly shallow lung inflation. No focal infiltrate, pulmonary edema, or pleural effusion. No pneumothorax.  Multilevel degenerative  changes noted within the visualized spine. Compression deformity within the mid thoracic spine is stable as compared to prior studies. No acute osseous abnormality.  IMPRESSION: No active cardiopulmonary disease.   Electronically Signed   By: Rise Mu M.D.   On: 11/09/2013 18:17   Dg Ribs Unilateral Left  11/09/2013   CLINICAL DATA:  Left lower rib pain, status post fall.  EXAM: LEFT RIBS - 2 VIEW  COMPARISON:  Chest radiograph performed earlier today at 6:03 p.m.  FINDINGS: No displaced rib fractures are seen.  Vascular congestion is noted, with mildly increased interstitial markings, likely transient in nature. No definite pleural effusion or pneumothorax is seen. The right lung is incompletely imaged on this study.  The cardiomediastinal silhouette is borderline normal in size. A vascular stent graft is seen overlying the mediastinum. No acute osseous abnormalities are seen.  IMPRESSION: No displaced rib fractures seen. Vascular congestion noted; visualized portions of the lungs remain grossly clear.   Electronically Signed   By: Roanna Raider M.D.   On: 11/09/2013 22:00    EKG Interpretation    Date/Time:  Tuesday November 09 2013 17:55:19 EST Ventricular Rate:  121 PR Interval:  160 QRS Duration: 110 QT Interval:  343 QTC Calculation: 487 R Axis:   56 Text Interpretation:  Sinus tachycardia Probable left atrial enlargement LVH with secondary repolarization abnormality ST depression, consider ischemia, diffuse lds Borderline prolonged QT interval Similar to prior Confirmed by Gwendolyn Grant  MD, Amaryah Mallen ([id number]) on 11/09/2013 6:03:45 PM            MDM   1. UTI (urinary tract infection)   2. Sepsis    60 are old female presents with cough, confusion, hypoxia, tachycardia. She's had urinary incontinence. She does have a history of UTIs. She is confused on the year, but she follows commands easily and is appropriate with me. She states she's had a cough that's giving her left-sided rib  pain for the past few days. Family is on the way and primary history. Here she tachycardic in the 130s. His O2 sat was in the 80s. On 2 L she was corrected in the mid 90s. Her lungs have diffuse rhonchi. We will proceed a septic workup including urine studies and cultures, blood cultures, fluid resuscitation, chest x-ray, labs. Urine shows UTI, rocephin given. Medicine admitting. Vitals improving after fluids, HR in the 110s, down from the 130s.   Dagmar Hait, MD 11/10/13 [phone]

## 2013-11-10 ENCOUNTER — Inpatient Hospital Stay (HOSPITAL_COMMUNITY): Payer: Medicare Other

## 2013-11-10 DIAGNOSIS — A419 Sepsis, unspecified organism: Secondary | ICD-10-CM | POA: Diagnosis present

## 2013-11-10 LAB — COMPREHENSIVE METABOLIC PANEL
ALBUMIN: 3 g/dL — AB (ref 3.5–5.2)
ALK PHOS: 65 U/L (ref 39–117)
ALT: 8 U/L (ref 0–35)
AST: 12 U/L (ref 0–37)
BUN: 8 mg/dL (ref 6–23)
CO2: 24 mEq/L (ref 19–32)
CREATININE: 0.56 mg/dL (ref 0.50–1.10)
Calcium: 8.4 mg/dL (ref 8.4–10.5)
Chloride: 97 mEq/L (ref 96–112)
GFR calc non Af Amer: 88 mL/min — ABNORMAL LOW (ref >90)
GLUCOSE: 103 mg/dL — AB (ref 70–99)
Potassium: 4 mEq/L (ref 3.7–5.3)
Sodium: 137 mEq/L (ref 137–147)
TOTAL PROTEIN: 6.8 g/dL (ref 6.0–8.3)
Total Bilirubin: 1 mg/dL (ref 0.3–1.2)

## 2013-11-10 LAB — RESPIRATORY VIRUS PANEL
ADENOVIRUS: NOT DETECTED
INFLUENZA A H1: NOT DETECTED
INFLUENZA A: NOT DETECTED
INFLUENZA B 1: NOT DETECTED
Influenza A H3: NOT DETECTED
Metapneumovirus: NOT DETECTED
Parainfluenza 1: NOT DETECTED
Parainfluenza 2: NOT DETECTED
Parainfluenza 3: NOT DETECTED
RESPIRATORY SYNCYTIAL VIRUS B: NOT DETECTED
Respiratory Syncytial Virus A: NOT DETECTED
Rhinovirus: NOT DETECTED

## 2013-11-10 LAB — CBC WITH DIFFERENTIAL/PLATELET
BASOS ABS: 0 10*3/uL (ref 0.0–0.1)
Basophils Relative: 0 % (ref 0–1)
EOS PCT: 0 % (ref 0–5)
Eosinophils Absolute: 0 10*3/uL (ref 0.0–0.7)
HCT: 40.9 % (ref 36.0–46.0)
Hemoglobin: 13.7 g/dL (ref 12.0–15.0)
LYMPHS ABS: 1 10*3/uL (ref 0.7–4.0)
LYMPHS PCT: 8 % — AB (ref 12–46)
MCH: 30.9 pg (ref 26.0–34.0)
MCHC: 33.5 g/dL (ref 30.0–36.0)
MCV: 92.1 fL (ref 78.0–100.0)
Monocytes Absolute: 1.1 10*3/uL — ABNORMAL HIGH (ref 0.1–1.0)
Monocytes Relative: 9 % (ref 3–12)
NEUTROS ABS: 9.6 10*3/uL — AB (ref 1.7–7.7)
Neutrophils Relative %: 82 % — ABNORMAL HIGH (ref 43–77)
Platelets: 190 10*3/uL (ref 150–400)
RBC: 4.44 MIL/uL (ref 3.87–5.11)
RDW: 13.3 % (ref 11.5–15.5)
WBC: 11.8 10*3/uL — AB (ref 4.0–10.5)

## 2013-11-10 LAB — GLUCOSE, CAPILLARY
Glucose-Capillary: 84 mg/dL (ref 70–99)
Glucose-Capillary: 104 mg/dL — ABNORMAL HIGH (ref 70–99)
Glucose-Capillary: 105 mg/dL — ABNORMAL HIGH (ref 70–99)
Glucose-Capillary: 119 mg/dL — ABNORMAL HIGH (ref 70–99)

## 2013-11-10 LAB — HEMOGLOBIN A1C
HEMOGLOBIN A1C: 6.7 % — AB (ref <5.7)
MEAN PLASMA GLUCOSE: 146 mg/dL — AB (ref <117)

## 2013-11-10 MED ORDER — LEVOFLOXACIN IN D5W 750 MG/150ML IV SOLN
750.0000 mg | INTRAVENOUS | Status: DC
  Administered 2013-11-10 – 2013-11-11 (×2): 750 mg via INTRAVENOUS
  Filled 2013-11-10 (×3): qty 150

## 2013-11-10 NOTE — ED Notes (Signed)
Lunch tray at bedside. ?

## 2013-11-10 NOTE — ED Notes (Signed)
Asked pt if she needs to go to the bathroom which she declines, pt is dry.  Set pt up for breakfast at this time.  Offered pt to set her up to get washed up later, advised her to let me know if she wanted this.

## 2013-11-10 NOTE — Progress Notes (Signed)
UR completed 

## 2013-11-10 NOTE — ED Notes (Signed)
Patient's daughter at bedside, updated her and patient with the plan of care for telemetry rather than stepdown bed.

## 2013-11-10 NOTE — ED Notes (Signed)
Spoke with [facility] who is contacting the admitting physician regarding changing bed status from Stepdown to Telemetry.

## 2013-11-10 NOTE — ED Notes (Signed)
Bed has not been changed from stepdown to tele. Corky Mull, [facility] [address] who will give the admitting MD another call.  Admitting MD to reassess and reassign if possible.

## 2013-11-10 NOTE — Progress Notes (Signed)
[facility] PROGRESS NOTE  [name] LDJ:[id number] DOB: July 08, 1935 DOA: 11/09/2013 PCP: [name], MD  Assessment/Plan:  ID:  - Will continue IV levaquin.  - Urine culture.  - WBC count trending down - Sputum cx pending.  - Started on tamiflu w/ resp virus panel pending.  - Currently hemodynamically stable.   Resp:  - stable on RA. - Continue levaquin   MSK: - Palpable L sided lateral rib pain.  - Prn vicodin for pain.  Endo: SSI. A1C.   Rheum: Continue prednisone.   Neuro/psych: continue regimen.   FEN/GI: heart healthy diet.   Prophylaxis: subq heparin   Disposition: pending further evaluation   Code Status:Full Code    Code Status: Full Family Communication: Pt in room (indicate person spoken with, relationship, and if by phone, the number) Disposition Plan: Pending  Antibiotics:  Tamiflu 11/09/13>>>  Levaquin 11/09/13>>>   HPI/Subjective: No acute events. Pt continues with cough. No SOB  Objective: Filed Vitals:   11/09/13 2220 11/10/13 0258 11/10/13 0300 11/10/13 0717  BP: 130/58 151/65  135/83  Pulse: 101 113  114  Temp:    98.7 F (37.1 C)  TempSrc:    Oral  Resp: 25   21  SpO2: 94% 89% 93% 90%   No intake or output data in the 24 hours ending 11/10/13 0915 There were no vitals filed for this visit.  Exam:   General:  Awake, in nad  Cardiovascular: regualr, s1, s2  Respiratory: normal resp effort, no wheezing  Abdomen: soft, nondistended  Musculoskeletal: perfused, no  clubbing   Data Reviewed: Basic Metabolic Panel:  Recent Labs Lab 11/09/13 1800 11/10/13 0525  NA 135* 137  K 4.0 4.0  CL 93* 97  CO2 23 24  GLUCOSE 119* 103*  BUN 10 8  CREATININE 0.60 0.56  CALCIUM 9.0 8.4   Liver Function Tests:  Recent Labs Lab 11/10/13 0525  AST 12  ALT 8  ALKPHOS 65  BILITOT 1.0  PROT 6.8  ALBUMIN 3.0*   No results found for this basename: LIPASE, AMYLASE,  in the last 168 hours No results found for  this basename: AMMONIA,  in the last 168 hours CBC:  Recent Labs Lab 11/09/13 1800 11/10/13 0525  WBC 13.0* 11.8*  NEUTROABS  --  9.6*  HGB 13.5 13.7  HCT 41.9 40.9  MCV 92.5 92.1  PLT 203 190   Cardiac Enzymes: No results found for this basename: CKTOTAL, CKMB, CKMBINDEX, TROPONINI,  in the last 168 hours BNP (last 3 results) No results found for this basename: PROBNP,  in the last 8760 hours CBG:  Recent Labs Lab 11/09/13 2154  GLUCAP 114*    Recent Results (from the past 240 hour(s))  CULTURE, BLOOD (ROUTINE X 2)     Status: None   Collection Time    11/09/13  6:00 PM      Result Value Range Status   Specimen Description BLOOD RIGHT ARM   Final   Special Requests BOTTLES DRAWN AEROBIC ONLY   Final   Culture  Setup Time     Final   Value: 11/09/2013 22:08     Performed at [facility]   Culture     Final   Value:        BLOOD CULTURE RECEIVED NO GROWTH TO DATE CULTURE WILL BE HELD FOR 5 DAYS BEFORE ISSUING A FINAL NEGATIVE REPORT     Performed at [facility]   Report Status PENDING  Incomplete  CULTURE, BLOOD (ROUTINE X 2)     Status: None   Collection Time    11/09/13  6:05 PM      Result Value Range Status   Specimen Description BLOOD RIGHT FOREARM   Final   Special Requests BOTTLES DRAWN AEROBIC ONLY   Final   Culture  Setup Time     Final   Value: 11/09/2013 22:08     Performed at [facility]   Culture     Final   Value:        BLOOD CULTURE RECEIVED NO GROWTH TO DATE CULTURE WILL BE HELD FOR 5 DAYS BEFORE ISSUING A FINAL NEGATIVE REPORT     Performed at [facility]   Report Status PENDING   Incomplete     Studies: X-ray Chest Pa And Lateral   11/10/2013   CLINICAL DATA:  Cough, congestion and weakness.  EXAM: CHEST  2 VIEW  COMPARISON:  Chest radiograph performed 11/09/2013  FINDINGS: The lungs are well-aerated. Mild left basilar opacity may reflect atelectasis or possibly mild pneumonia. There is no evidence  of pleural effusion or pneumothorax.  The heart is borderline normal in size. A stent graft is seen overlying the mediastinum. No acute osseous abnormalities are seen. Clips are noted within the right upper quadrant, reflecting prior cholecystectomy.  IMPRESSION: Mild left basilar airspace opacity may reflect atelectasis or possibly mild pneumonia.   Electronically Signed   By: Roanna Raider M.D.   On: 11/10/2013 05:31   Dg Chest 2 View  11/09/2013   CLINICAL DATA:  Shortness of breath, cough, weakness  EXAM: CHEST  2 VIEW  COMPARISON:  Prior radiograph from 09/15/2013  FINDINGS: Vascular stent again projects over the heart. Cardiac and mediastinal silhouettes are unchanged. Atherosclerotic calcifications noted within the aortic arch.  Patient has taken a slightly shallow lung inflation. No focal infiltrate, pulmonary edema, or pleural effusion. No pneumothorax.  Multilevel degenerative changes noted within the visualized spine. Compression deformity within the mid thoracic spine is stable as compared to prior studies. No acute osseous abnormality.  IMPRESSION: No active cardiopulmonary disease.   Electronically Signed   By: Rise Mu M.D.   On: 11/09/2013 18:17   Dg Ribs Unilateral Left  11/09/2013   CLINICAL DATA:  Left lower rib pain, status post fall.  EXAM: LEFT RIBS - 2 VIEW  COMPARISON:  Chest radiograph performed earlier today at 6:03 p.m.  FINDINGS: No displaced rib fractures are seen.  Vascular congestion is noted, with mildly increased interstitial markings, likely transient in nature. No definite pleural effusion or pneumothorax is seen. The right lung is incompletely imaged on this study.  The cardiomediastinal silhouette is borderline normal in size. A vascular stent graft is seen overlying the mediastinum. No acute osseous abnormalities are seen.  IMPRESSION: No displaced rib fractures seen. Vascular congestion noted; visualized portions of the lungs remain grossly clear.    Electronically Signed   By: Roanna Raider M.D.   On: 11/09/2013 22:00    Scheduled Meds: . amitriptyline  75 mg Oral QHS  . buPROPion  75 mg Oral QAC breakfast  . cholecalciferol  5,000 Units Oral Daily  . heparin  5,000 Units Subcutaneous Q8H  . insulin aspart  0-5 Units Subcutaneous QHS  . insulin aspart  0-9 Units Subcutaneous TID WC  . mirtazapine  30 mg Oral QHS  . oseltamivir  75 mg Oral BID  . predniSONE  2.5 mg Oral Q breakfast  .  sodium chloride  3 mL Intravenous Q12H   Continuous Infusions: . sodium chloride 100 mL/hr at 11/10/13 0913  . levofloxacin (LEVAQUIN) IV      Active Problems:   UTI (lower urinary tract infection)    Time spent:    CHIU, STEPHEN K  [facility] Pager [phone]. If 7PM-7AM, please contact night-coverage at [email or web address], password [facility] 11/10/2013, 9:15 AM  LOS: 1 day

## 2013-11-10 NOTE — ED Notes (Signed)
RT was here to see pt, unable to get sputum culture, cup at bedside pt to collect once able.  Admitting MD here to round on pt.  Pt on droplet precautions, sign and masks placed o door

## 2013-11-10 NOTE — ED Notes (Signed)
Called RT to come get sputum culture per admission orders

## 2013-11-10 NOTE — ED Notes (Signed)
Patient sitting on edge of bed, talked to me a few minutes and asked to ambulate to restroom.

## 2013-11-11 ENCOUNTER — Inpatient Hospital Stay (HOSPITAL_COMMUNITY): Payer: Medicare Other

## 2013-11-11 DIAGNOSIS — R509 Fever, unspecified: Secondary | ICD-10-CM

## 2013-11-11 LAB — CBC WITH DIFFERENTIAL/PLATELET
Basophils Absolute: 0 10*3/uL (ref 0.0–0.1)
Basophils Relative: 0 % (ref 0–1)
EOS PCT: 1 % (ref 0–5)
Eosinophils Absolute: 0.1 10*3/uL (ref 0.0–0.7)
HCT: 38.3 % (ref 36.0–46.0)
HEMOGLOBIN: 12.8 g/dL (ref 12.0–15.0)
Lymphocytes Relative: 10 % — ABNORMAL LOW (ref 12–46)
Lymphs Abs: 0.9 10*3/uL (ref 0.7–4.0)
MCH: 30.5 pg (ref 26.0–34.0)
MCHC: 33.4 g/dL (ref 30.0–36.0)
MCV: 91.4 fL (ref 78.0–100.0)
MONO ABS: 0.9 10*3/uL (ref 0.1–1.0)
MONOS PCT: 10 % (ref 3–12)
Neutro Abs: 7.4 10*3/uL (ref 1.7–7.7)
Neutrophils Relative %: 79 % — ABNORMAL HIGH (ref 43–77)
Platelets: 205 10*3/uL (ref 150–400)
RBC: 4.19 MIL/uL (ref 3.87–5.11)
RDW: 13.2 % (ref 11.5–15.5)
WBC: 9.4 10*3/uL (ref 4.0–10.5)

## 2013-11-11 LAB — GLUCOSE, CAPILLARY
GLUCOSE-CAPILLARY: 102 mg/dL — AB (ref 70–99)
Glucose-Capillary: 110 mg/dL — ABNORMAL HIGH (ref 70–99)
Glucose-Capillary: 85 mg/dL (ref 70–99)
Glucose-Capillary: 101 mg/dL — ABNORMAL HIGH (ref 70–99)

## 2013-11-11 LAB — COMPREHENSIVE METABOLIC PANEL
ALK PHOS: 61 U/L (ref 39–117)
ALT: 9 U/L (ref 0–35)
AST: 11 U/L (ref 0–37)
Albumin: 2.6 g/dL — ABNORMAL LOW (ref 3.5–5.2)
BUN: 6 mg/dL (ref 6–23)
CO2: 25 mEq/L (ref 19–32)
Calcium: 8.2 mg/dL — ABNORMAL LOW (ref 8.4–10.5)
Chloride: 96 mEq/L (ref 96–112)
Creatinine, Ser: 0.48 mg/dL — ABNORMAL LOW (ref 0.50–1.10)
GFR calc Af Amer: >90 mL/min (ref >90)
GFR calc non Af Amer: >90 mL/min (ref >90)
Glucose, Bld: 133 mg/dL — ABNORMAL HIGH (ref 70–99)
POTASSIUM: 3.5 meq/L — AB (ref 3.7–5.3)
SODIUM: 134 meq/L — AB (ref 137–147)
Total Bilirubin: 0.6 mg/dL (ref 0.3–1.2)
Total Protein: 6.4 g/dL (ref 6.0–8.3)

## 2013-11-11 MED ORDER — BENZONATATE 100 MG PO CAPS
100.0000 mg | ORAL_CAPSULE | Freq: Three times a day (TID) | ORAL | Status: DC | PRN
  Administered 2013-11-11 – 2013-11-14 (×6): 100 mg via ORAL
  Filled 2013-11-11 (×7): qty 1

## 2013-11-11 NOTE — Clinical Documentation Improvement (Signed)
Possible Clinical Conditions?  Encephalopathy (describe type if known)                       Anoxic                       Septic                       Alcoholic                        Hepatic                       Hypertensive                       Metabolic                       Toxic  Drug induced delirium   Poisoning / Overdose Other Condition Cannot Clinically Determine   Supporting Information:   Risk Factors: UTI, Sepsis syndrome, Memory deficit  Signs & Symptoms: Na 134, K 3.5  pn 11/11/13 per staff:"was extremely confused. Not aware of place or time. Calling out .Marland KitchenMarland Kitchenrefused labs, 0600 medications, and vital signs. Patient refused to wear oxygen.   Treatment: Xanax 1mg  po TID prn anxiety//                    Safety precautions                    Family made aware, pending family at bedside  Thank You, ,RN Clinical Documentation Specialist:  [phone]  [facility]- Health Information Management

## 2013-11-11 NOTE — Progress Notes (Signed)
Patient was awaken by lab and was extremely confused. Not aware of place or time. Calling out for [employer] and [name] (daughter). Patient refused labs, 0600 medications, and vital signs. Patient refused to wear oxygen. Attempted to explain to patient where she was and time and she refused the information. Was unsure of why staff was in her room. Spoke to Daughter [name] over phone Nursing staff spoke to her also. Patient remained agitated. Daughter was concerned about why she remains so confused. Family is aware of UTI dx. And symptoms. Nursing staff Request daughter to come to bedside to assist in reassuring patient. Daughter should be arriving some time early this morning. Will continue to monitor.

## 2013-11-11 NOTE — Progress Notes (Signed)
[facility] PROGRESS NOTE  GIANINA OLINDE MGN:[id number] DOB: [birth year] DOA: 11/09/2013 PCP: Donnajean Lopes, MD  Assessment/Plan:  ID:  - Will continue IV levaquin.  - Urine culture with >100.000 GNR, await speciation - WBC count trending down - Sputum cx pending.  - Flu neg - Currently hemodynamically stable.   Resp:  - stable on RA. - Continue levaquin   MSK: - Palpable L sided lateral rib pain.  - Prn vicodin for pain.  Abd distension - Acute series reviewed - Appears to be questionable increased colonic distension on my own read - Will obtain dedicated abd xray to r/o obstruction in the setting of pain med use  Endo: SSI. A1C.   Rheum: Continue prednisone.   Neuro/psych: continue regimen.   FEN/GI: heart healthy diet.   Prophylaxis: subq heparin   Disposition: pending further evaluation   Code Status:Full Code   Code Status: Full Family Communication: Pt in room (indicate person spoken with, relationship, and if by phone, the number) Disposition Plan: Pending  Antibiotics:  Tamiflu 11/09/13>>>11/11/13  Levaquin 11/09/13>>>   HPI/Subjective: No acute events. Pt continues with cough  Objective: Filed Vitals:   11/10/13 1954 11/10/13 2100 11/11/13 0521 11/11/13 0636  BP:  132/54    Pulse:  110  111  Temp:  98.8 F (37.1 C)  97.3 F (36.3 C)  TempSrc:  Oral  Axillary  Resp: _0 Weight:   66.3 kg (146 lb 2.6 oz)   SpO2: 96% 99%  93%    Intake/Output Summary (Last 24 hours) at 11/11/13 1226 Last data filed at 11/11/13 0941  Gross per 24 hour  Intake    240 ml  Output      0 ml  Net    240 ml   Filed Weights   11/11/13 0521  Weight: 66.3 kg (146 lb 2.6 oz)    Exam:   General:  Awake, in nad  Cardiovascular: regualr, s1, s2  Respiratory: normal resp effort, no wheezing  Abdomen: soft, nondistended  Musculoskeletal: perfused, no  clubbing   Data Reviewed: Basic Metabolic Panel:  Recent Labs Lab 11/09/13 1800  11/10/13 0525 11/11/13 0833  NA 135* 137 134*  K 4.0 4.0 3.5*  CL 93* 97 96  CO2 _1 GLUCOSE 119* 103* 133*  BUN _2 CREATININE 0.60 0.56 0.48*  CALCIUM 9.0 8.4 8.2*   Liver Function Tests:  Recent Labs Lab 11/10/13 0525 11/11/13 0833  AST 12 11  ALT 8 9  ALKPHOS 65 61  BILITOT 1.0 0.6  PROT 6.8 6.4  ALBUMIN 3.0* 2.6*   No results found for this basename: LIPASE, AMYLASE,  in the last 168 hours No results found for this basename: AMMONIA,  in the last 168 hours CBC:  Recent Labs Lab 11/09/13 1800 11/10/13 0525 11/11/13 0833  WBC 13.0* 11.8* 9.4  NEUTROABS  --  9.6* 7.4  HGB 13.5 13.7 12.8  HCT 41.9 40.9 38.3  MCV 92.5 92.1 91.4  PLT 203 190 205   Cardiac Enzymes: No results found for this basename: CKTOTAL, CKMB, CKMBINDEX, TROPONINI,  in the last 168 hours BNP (last 3 results) No results found for this basename: PROBNP,  in the last 8760 hours CBG:  Recent Labs Lab 11/10/13 1430 11/10/13 1731 11/10/13 2148 11/11/13 0744 11/11/13 1143  GLUCAP 104* 105* 119* 102* 110*    Recent Results (from the past 240 hour(s))  URINE CULTURE     Status: None  Collection Time    11/09/13  5:49 PM      Result Value Range Status   Specimen Description URINE, CLEAN CATCH   Final   Special Requests Normal   Final   Culture  Setup Time     Final   Value: 11/10/2013 05:54     Performed at [facility]     Final   Value: >=100,000 COLONIES/ML     Performed at [facility]   Culture     Final   Value: [place]     Performed at [facility]   Report Status PENDING   Incomplete  CULTURE, BLOOD (ROUTINE X 2)     Status: None   Collection Time    11/09/13  6:00 PM      Result Value Range Status   Specimen Description BLOOD RIGHT ARM   Final   Special Requests BOTTLES DRAWN AEROBIC ONLY 4ML   Final   Culture  Setup Time     Final   Value: 11/09/2013 22:08     Performed at [facility]   Culture      Final   Value:        BLOOD CULTURE RECEIVED NO GROWTH TO DATE CULTURE WILL BE HELD FOR 5 DAYS BEFORE ISSUING A FINAL NEGATIVE REPORT     Performed at [facility]   Report Status PENDING   Incomplete  CULTURE, BLOOD (ROUTINE X 2)     Status: None   Collection Time    11/09/13  6:05 PM      Result Value Range Status   Specimen Description BLOOD RIGHT FOREARM   Final   Special Requests BOTTLES DRAWN AEROBIC ONLY 3ML   Final   Culture  Setup Time     Final   Value: 11/09/2013 22:08     Performed at [facility]   Culture     Final   Value:        BLOOD CULTURE RECEIVED NO GROWTH TO DATE CULTURE WILL BE HELD FOR 5 DAYS BEFORE ISSUING A FINAL NEGATIVE REPORT     Performed at [facility]   Report Status PENDING   Incomplete  RESPIRATORY VIRUS PANEL     Status: None   Collection Time    11/09/13  9:50 PM      Result Value Range Status   Source - RVPAN NOSE   Final   Respiratory Syncytial Virus A NOT DETECTED   Final   Respiratory Syncytial Virus B NOT DETECTED   Final   Influenza A NOT DETECTED   Final   Influenza B NOT DETECTED   Final   Parainfluenza 1 NOT DETECTED   Final   Parainfluenza 2 NOT DETECTED   Final   Parainfluenza 3 NOT DETECTED   Final   Metapneumovirus NOT DETECTED   Final   Rhinovirus NOT DETECTED   Final   Adenovirus NOT DETECTED   Final   Influenza A H1 NOT DETECTED   Final   Influenza A H3 NOT DETECTED   Final   Comment: (NOTE)           Normal Reference Range for each Analyte: NOT DETECTED     Testing performed using the Luminex xTAG Respiratory Viral Panel test     kit.     This test was developed and its performance characteristics determined     by [facility]. It has not been  cleared or approved by the [place]     Food and Drug Administration. This test is used for clinical purposes.     It should not be regarded as investigational or for research. This     laboratory is certified under the [place] (CLIA) as qualified to perform high complexity     clinical laboratory testing.     Performed at [facility]     Studies: X-ray Chest Pa And Lateral   11/10/2013   CLINICAL DATA:  Cough, congestion and weakness.  EXAM: CHEST  2 VIEW  COMPARISON:  Chest radiograph performed 11/09/2013  FINDINGS: The lungs are well-aerated. Mild left basilar opacity may reflect atelectasis or possibly mild pneumonia. There is no evidence of pleural effusion or pneumothorax.  The heart is borderline normal in size. A stent graft is seen overlying the mediastinum. No acute osseous abnormalities are seen. Clips are noted within the right upper quadrant, reflecting prior cholecystectomy.  IMPRESSION: Mild left basilar airspace opacity may reflect atelectasis or possibly mild pneumonia.   Electronically Signed   By: Garald Balding M.D.   On: 11/10/2013 05:31   Dg Chest 2 View  11/09/2013   CLINICAL DATA:  Shortness of breath, cough, weakness  EXAM: CHEST  2 VIEW  COMPARISON:  Prior radiograph from 09/15/2013  FINDINGS: Vascular stent again projects over the heart. Cardiac and mediastinal silhouettes are unchanged. Atherosclerotic calcifications noted within the aortic arch.  Patient has taken a slightly shallow lung inflation. No focal infiltrate, pulmonary edema, or pleural effusion. No pneumothorax.  Multilevel degenerative changes noted within the visualized spine. Compression deformity within the mid thoracic spine is stable as compared to prior studies. No acute osseous abnormality.  IMPRESSION: No active cardiopulmonary disease.   Electronically Signed   By: Jeannine Boga M.D.   On: 11/09/2013 18:17   Dg Ribs Unilateral Left  11/09/2013   CLINICAL DATA:  Left lower rib pain, status post fall.  EXAM: LEFT RIBS - 2 VIEW  COMPARISON:  Chest radiograph performed earlier today at 6:03 p.m.  FINDINGS: No displaced rib fractures are seen.  Vascular congestion is noted, with mildly increased  interstitial markings, likely transient in nature. No definite pleural effusion or pneumothorax is seen. The right lung is incompletely imaged on this study.  The cardiomediastinal silhouette is borderline normal in size. A vascular stent graft is seen overlying the mediastinum. No acute osseous abnormalities are seen.  IMPRESSION: No displaced rib fractures seen. Vascular congestion noted; visualized portions of the lungs remain grossly clear.   Electronically Signed   By: Garald Balding M.D.   On: 11/09/2013 22:00    Scheduled Meds: . amitriptyline  75 mg Oral QHS  . buPROPion  75 mg Oral QAC breakfast  . cholecalciferol  5,000 Units Oral Daily  . heparin  5,000 Units Subcutaneous Q8H  . insulin aspart  0-5 Units Subcutaneous QHS  . insulin aspart  0-9 Units Subcutaneous TID WC  . levofloxacin (LEVAQUIN) IV  750 mg Intravenous Q24H  . mirtazapine  30 mg Oral QHS  . predniSONE  2.5 mg Oral Q breakfast  . sodium chloride  3 mL Intravenous Q12H   Continuous Infusions: . sodium chloride 100 mL/hr at 11/10/13 1443    Active Problems:   UTI (lower urinary tract infection)   Sepsis    Time spent: 5mn    Yael Angerer, STill[facility] CornerHospitalists Pager [phone] If 7PM-7AM, please contact  night-coverage at [email or web address], password [email or web address] 11/11/2013, 12:26 PM  LOS: 2 days

## 2013-11-12 ENCOUNTER — Inpatient Hospital Stay (HOSPITAL_COMMUNITY): Payer: Medicare Other

## 2013-11-12 DIAGNOSIS — R5383 Other fatigue: Secondary | ICD-10-CM

## 2013-11-12 DIAGNOSIS — R05 Cough: Secondary | ICD-10-CM

## 2013-11-12 DIAGNOSIS — R059 Cough, unspecified: Secondary | ICD-10-CM

## 2013-11-12 DIAGNOSIS — R5381 Other malaise: Secondary | ICD-10-CM

## 2013-11-12 DIAGNOSIS — E119 Type 2 diabetes mellitus without complications: Secondary | ICD-10-CM

## 2013-11-12 LAB — COMPREHENSIVE METABOLIC PANEL
ALK PHOS: 58 U/L (ref 39–117)
ALT: 14 U/L (ref 0–35)
AST: 15 U/L (ref 0–37)
Albumin: 2.6 g/dL — ABNORMAL LOW (ref 3.5–5.2)
BILIRUBIN TOTAL: 0.5 mg/dL (ref 0.3–1.2)
BUN: 7 mg/dL (ref 6–23)
CHLORIDE: 102 meq/L (ref 96–112)
CO2: 25 mEq/L (ref 19–32)
Calcium: 8.1 mg/dL — ABNORMAL LOW (ref 8.4–10.5)
Creatinine, Ser: 0.61 mg/dL (ref 0.50–1.10)
GFR calc non Af Amer: 86 mL/min — ABNORMAL LOW (ref >90)
GLUCOSE: 124 mg/dL — AB (ref 70–99)
POTASSIUM: 3.6 meq/L — AB (ref 3.7–5.3)
Sodium: 141 mEq/L (ref 137–147)
TOTAL PROTEIN: 6 g/dL (ref 6.0–8.3)

## 2013-11-12 LAB — CBC WITH DIFFERENTIAL/PLATELET
BASOS ABS: 0 10*3/uL (ref 0.0–0.1)
BASOS PCT: 0 % (ref 0–1)
EOS PCT: 1 % (ref 0–5)
Eosinophils Absolute: 0.1 10*3/uL (ref 0.0–0.7)
HCT: 38.1 % (ref 36.0–46.0)
Hemoglobin: 12.5 g/dL (ref 12.0–15.0)
Lymphocytes Relative: 9 % — ABNORMAL LOW (ref 12–46)
Lymphs Abs: 0.8 10*3/uL (ref 0.7–4.0)
MCH: 30.3 pg (ref 26.0–34.0)
MCHC: 32.8 g/dL (ref 30.0–36.0)
MCV: 92.5 fL (ref 78.0–100.0)
Monocytes Absolute: 0.8 10*3/uL (ref 0.1–1.0)
Monocytes Relative: 9 % (ref 3–12)
NEUTROS ABS: 7.1 10*3/uL (ref 1.7–7.7)
Neutrophils Relative %: 80 % — ABNORMAL HIGH (ref 43–77)
Platelets: 177 10*3/uL (ref 150–400)
RBC: 4.12 MIL/uL (ref 3.87–5.11)
RDW: 13.5 % (ref 11.5–15.5)
WBC: 8.9 10*3/uL (ref 4.0–10.5)

## 2013-11-12 LAB — GLUCOSE, CAPILLARY
GLUCOSE-CAPILLARY: 153 mg/dL — AB (ref 70–99)
GLUCOSE-CAPILLARY: 108 mg/dL — AB (ref 70–99)
Glucose-Capillary: 115 mg/dL — ABNORMAL HIGH (ref 70–99)
Glucose-Capillary: 149 mg/dL — ABNORMAL HIGH (ref 70–99)

## 2013-11-12 LAB — URINE CULTURE
Colony Count: >=100000
Special Requests: NORMAL

## 2013-11-12 MED ORDER — LEVOFLOXACIN 750 MG PO TABS
750.0000 mg | ORAL_TABLET | ORAL | Status: DC
  Filled 2013-11-12: qty 1

## 2013-11-12 MED ORDER — LEVOFLOXACIN IN D5W 750 MG/150ML IV SOLN
750.0000 mg | INTRAVENOUS | Status: DC
  Administered 2013-11-12 – 2013-11-13 (×2): 750 mg via INTRAVENOUS
  Filled 2013-11-12 (×3): qty 150

## 2013-11-12 MED ORDER — LEVOFLOXACIN IN D5W 750 MG/150ML IV SOLN: 750.0000 mg | INTRAVENOUS | Status: DC

## 2013-11-12 NOTE — Evaluation (Signed)
Occupational Therapy Evaluation Patient Details Name: [name] MRN: [id number] DOB: [birth year] Today's Date: 11/12/2013 Time: 1240-1300 OT Time Calculation (min): 20 min  OT Assessment / Plan / Recommendation History of present illness [name] is a pleasant 78 yo female who lives alone independently.  Pmhx is as below.  She does have a history of requiring admission for uti.  She is on chronic prednisone for RA for a long time,  7 or 8 years in all.  She had some loss of control of bowels three days ago. Then two  Days ago she felt okay.  Then today, her daughter called her and she was in bed, and confused and she had urine incontinence in the bed.  She does report some dysuria in the last 24 hrs.  No nausea or vomiting.  No sob.  Yesterday she reported a bit of cp for a time, not today.  No blood noted above or below.   Clinical Impression   Pt admitted for the above diagnosis and has the deficits listed below.  Pt would benefit from cont OT to attempt to increase I with adls so she can eventually live alone again after a short SNF stay.     OT Assessment  Patient needs continued OT Services    Follow Up Recommendations  SNF;Supervision/Assistance - 24 hour    Barriers to Discharge Decreased caregiver support pt lives alone  Equipment Recommendations  Other (comment) (unsure what pt has due to confusion)    Recommendations for Other Services    Frequency  Min 2X/week    Precautions / Restrictions Precautions Precautions: Fall Precaution Comments: Pt did fall in hospital night of 1/22 Restrictions Weight Bearing Restrictions: No   Pertinent Vitals/Pain Pt unable to rate pain but appears to have pelvic pain.    ADL  Eating/Feeding: Performed;Set up Where Assessed - Eating/Feeding: Bed level Grooming: Performed;Minimal assistance Where Assessed - Grooming: Supported sitting Upper Body Bathing: Simulated;Minimal assistance Where Assessed - Upper Body Bathing: Supported  sitting Lower Body Bathing: Simulated;Moderate assistance Where Assessed - Lower Body Bathing: Supported sit to stand Upper Body Dressing: Simulated;Moderate assistance Where Assessed - Upper Body Dressing: Supported sitting Lower Body Dressing: Performed;Maximal assistance Where Assessed - Lower Body Dressing: Supported sit to stand Toilet Transfer: Performed;Maximal Dentist Method: Surveyor, minerals: Materials engineer and Hygiene: Performed;Maximal assistance Where Assessed - Engineer, mining and Hygiene: Sit to stand from 3-in-1 or toilet Transfers/Ambulation Related to ADLs: Pt unable to walk during this session c/o pain in pelvis area.  Nursing notified.  Pt did transfers to and from commode with max assist.  Pt impulsive and  unsafe with transfers. ADL Comments: Pt confused and unable to complete adls asked of her today w/o max encouragement and vcs.  Pt unsafe on her feet during adls in standing.    OT Diagnosis: Generalized weakness;Cognitive deficits;Acute pain  OT Problem List: Decreased strength;Decreased activity tolerance;Impaired balance (sitting and/or standing);Decreased cognition;Decreased safety awareness;Decreased knowledge of use of DME or AE;Decreased knowledge of precautions;Pain OT Treatment Interventions: Self-care/ADL training;Therapeutic activities;DME and/or AE instruction   OT Goals(Current goals can be found in the care plan section) Acute Rehab OT Goals Patient Stated Goal: to go home. OT Goal Formulation: With patient Time For Goal Achievement: 11/26/13 Potential to Achieve Goals: Fair ADL Goals Pt Will Perform Grooming: with min assist;standing Pt Will Perform Lower Body Bathing: with min assist;sit to/from stand Pt Will Perform Lower Body Dressing: with min assist;sit  to/from stand Pt Will Perform Tub/Shower Transfer: Tub transfer;ambulating;tub bench;rolling  walker Additional ADL Goal #1: Pt will complete toileting with 3:1 over commode with min assist. Additional ADL Goal #2: Pt will answer orientation questions with 100% accuracy to increase awareness of surroundings.  Visit Information  Last OT Received On: 11/12/13 Assistance Needed: +1 History of Present Illness: [name] is a pleasant 79 yo female who lives alone independently.  Pmhx is as below.  She does have a history of requiring admission for uti.  She is on chronic prednisone for RA for a long time,  7 or 8 years in all.  She had some loss of control of bowels three days ago. Then two  Days ago she felt okay.  Then today, her daughter called her and she was in bed, and confused and she had urine incontinence in the bed.  She does report some dysuria in the last 24 hrs.  No nausea or vomiting.  No sob.  Yesterday she reported a bit of cp for a time, not today.  No blood noted above or below.       Prior Functioning     Home Living Family/patient expects to be discharged to:: Private residence Living Arrangements: Alone Available Help at Discharge: Family;Available PRN/intermittently Type of Home: Apartment Home Access: Stairs to enter Entrance Stairs-Number of Steps: 5 then 3. the first set of stairs has a rail. Home Layout: One level Home Equipment: Walker - 2 wheels;Cane - single point;Shower seat Prior Function Level of Independence: Independent with assistive device(s) Comments: Pt states she is indepenent with RW and uses cane and rail for stairs outside apt Communication Communication: No difficulties Dominant Hand: Right         Vision/Perception Vision - History Baseline Vision: Wears glasses all the time Patient Visual Report: No change from baseline Vision - Assessment Vision Assessment: Vision impaired - to be further tested in functional context Additional Comments: difficult to know due to cognitive status.   Cognition  Cognition Arousal/Alertness:  Awake/alert Behavior During Therapy: Restless Overall Cognitive Status: Impaired/Different from baseline Area of Impairment: Orientation;Attention;Safety/judgement;Awareness;Problem solving Orientation Level: Disoriented to;Place;Time;Situation Current Attention Level: Focused Memory: Decreased recall of precautions;Decreased short-term memory Safety/Judgement: Decreased awareness of safety;Decreased awareness of deficits Awareness: Intellectual Problem Solving: Slow processing General Comments: Pt very confused.  At some moments she seems clear and then will change moments later.    Extremity/Trunk Assessment Upper Extremity Assessment Upper Extremity Assessment: Overall WFL for tasks assessed Lower Extremity Assessment Lower Extremity Assessment: Defer to PT evaluation Cervical / Trunk Assessment Cervical / Trunk Assessment: Kyphotic     Mobility Bed Mobility Overal bed mobility: Needs Assistance Bed Mobility: Supine to Sit;Sit to Supine Supine to sit: Mod assist Sit to supine: Max assist General bed mobility comments: Pt confused with what side of bed to get up on and how to assist self in getting out of bed.  When redirected to bedrails etc, pt was able to help minimally. Transfers Overall transfer level: Needs assistance Equipment used: 1 person hand held assist Transfers: Sit to/from [employer] Sit to Stand: Mod assist Stand pivot transfers: Max assist General transfer comment: Nursing stated that pt walked to br the night before her fall.  Today, pt having great difficulty just transferring to the [facility] and back to bed due to c/o pain in pelvic region.  Nursing notified.     Exercise     Balance Balance Overall balance assessment: Needs assistance Sitting-balance support: Bilateral upper extremity  supported;Feet supported Sitting balance-Leahy Scale: Poor Sitting balance - Comments: Pt with a posterior lean.  possibly leaning this way to try to relieve  pelvic pain? Standing balance support: Bilateral upper extremity supported;During functional activity Standing balance-Leahy Scale: Poor Standing balance comment: Pt unable to hold self up w/o max outside assist. General Comments General comments (skin integrity, edema, etc.): Pt very confused, appears in pain but has difficultyy stating where the pain exactly is.   End of Session OT - End of Session Activity Tolerance: Patient limited by pain Patient left: in bed;with call bell/phone within reach;with bed alarm set Nurse Communication: Mobility status;Other (comment) (decline in status from physical standpoint)  GO     Hope Budds 11/12/2013, 1:20 PM [phone]

## 2013-11-12 NOTE — Progress Notes (Signed)
11/12/13 0035  What Happened  Was fall witnessed? No  Was patient injured? Yes  Patient found on floor  Found by Staff-comment (Shyheim Tanney,rn)  Stated prior activity other (comment) (in bed)  Follow Up  MD notified Lenny Pastel, np  Time MD notified 2200  Family notified Yes-comment (son, larry Platten. called and left message. call returned)  Time family notified 2218  Additional tests Yes-comment (head CT)  Simple treatment Ice (dsg)  Progress note created (see row info) Yes

## 2013-11-12 NOTE — Evaluation (Signed)
Physical Therapy Evaluation Patient Details Name: ORIEL OJO MRN: [id number] DOB: 10-04-1935 Today's Date: 11/12/2013 Time: 1351-1415 PT Time Calculation (min): 24 min  PT Assessment / Plan / Recommendation History of Present Illness  Jailani is a pleasant 79 yo female who lives alone independently.  Pmhx is as below.  She does have a history of requiring admission for uti.  She is on chronic prednisone for RA for a long time,  7 or 8 years in all.  She had some loss of control of bowels three days ago. Then two  Days ago she felt okay.  Then today, her daughter called her and she was in bed, and confused and she had urine incontinence in the bed.  She does report some dysuria in the last 24 hrs.  No nausea or vomiting.  No sob.  Yesterday she reported a bit of cp for a time, not today.  No blood noted above or below.  Clinical Impression  Pt was not participatory today. Pt does report discomfort in L hip. LLE is positioned in Internal rotation. PT was able to flex hip and knee in supine, Leg is tight.  Pt. Will benefit from PT to address problems listed. Recommend SNF. Noted bil. Hip xrays negative.    PT Assessment  Patient needs continued PT services    Follow Up Recommendations  SNF;Supervision/Assistance - 24 hour    Does the patient have the potential to tolerate intense rehabilitation      Barriers to Discharge Decreased caregiver support      Equipment Recommendations  None recommended by PT    Recommendations for Other Services     Frequency Min 3X/week    Precautions / Restrictions Precautions Precautions: Fall Precaution Comments: Pt did fall in hospital night of 1/22- Bil. hip xrays neg  Restrictions Weight Bearing Restrictions: No   Pertinent Vitals/Pain L hip is painful with attempts to  Flex and position near midline      Mobility  Bed Mobility Overal bed mobility: Needs Assistance Bed Mobility: Supine to Sit;Sit to Supine Supine to sit: Mod assist Sit  to supine: Max assist General bed mobility comments: pt would not participate in getting to edge af bed. Pt is lying with LLE in internal rotation. Placed Pillow under/between LLE. Transfers Overall transfer level: Needs assistance Equipment used: 1 person hand held assist Transfers: Sit to/from [employer] Sit to Stand: Mod assist Stand pivot transfers: Max assist General transfer comment: Nursing stated that pt walked to br the night before her fall.  pt appears much less mobile    Exercises     PT Diagnosis: Difficulty walking;Acute pain  PT Problem List: Decreased strength;Decreased range of motion;Decreased activity tolerance;Decreased balance;Decreased mobility;Decreased cognition;Decreased knowledge of use of DME;Decreased safety awareness;Decreased knowledge of precautions PT Treatment Interventions: DME instruction;Gait training;Functional mobility training;Therapeutic activities;Therapeutic exercise;Patient/family education     PT Goals(Current goals can be found in the care plan section) Acute Rehab PT Goals Patient Stated Goal: to just lie here and rest. PT Goal Formulation: Patient unable to participate in goal setting Time For Goal Achievement: 11/26/13 Potential to Achieve Goals: Good  Visit Information  Last PT Received On: 11/12/13 Assistance Needed: +2 History of Present Illness: Mia is a pleasant 79 yo female who lives alone independently.  Pmhx is as below.  She does have a history of requiring admission for uti.  She is on chronic prednisone for RA for a long time,  7 or 8 years in all.  She had some loss of control of bowels three days ago. Then two  Days ago she felt okay.  Then today, her daughter called her and she was in bed, and confused and she had urine incontinence in the bed.  She does report some dysuria in the last 24 hrs.  No nausea or vomiting.  No sob.  Yesterday she reported a bit of cp for a time, not today.  No blood noted above or  below.       Prior Functioning  Home Living Family/patient expects to be discharged to:: Skilled nursing facility Living Arrangements: Alone Available Help at Discharge: Family;Available PRN/intermittently Type of Home: Apartment Home Access: Stairs to enter Entrance Stairs-Number of Steps: 5 then 3. the first set of stairs has a rail. Home Layout: One level Home Equipment: Walker - 2 wheels;Cane - single point;Shower seat Prior Function Level of Independence: Independent with assistive device(s) Comments: Pt states she is indepenent with RW and uses cane and rail for stairs outside apt Communication Communication: No difficulties Dominant Hand: Right    Cognition  Cognition Arousal/Alertness: Awake/alert Behavior During Therapy: Restless Overall Cognitive Status: Impaired/Different from baseline Area of Impairment: Orientation;Attention;Safety/judgement;Awareness;Problem solving Orientation Level: Disoriented to;Place;Time;Situation Current Attention Level: Focused Memory: Decreased recall of precautions;Decreased short-term memory Safety/Judgement: Decreased awareness of safety;Decreased awareness of deficits Awareness: Intellectual Problem Solving: Slow processing General Comments: pt adamant about not wanting to get up. Pt appears upset w/ continued attemps    Extremity/Trunk Assessment Upper Extremity Assessment Upper Extremity Assessment: Overall WFL for tasks assessed Lower Extremity Assessment Lower Extremity Assessment: LLE deficits/detail;RLE deficits/detail RLE Deficits / Details: pt is stiff to try to flex leg, may be a component that pt does not want to participate.Marland Kitchen LLE Deficits / Details: Leg is positioned in Internal rotation when in supine. flexed hip and knee and pt reports discomfort. Cervical / Trunk Assessment Cervical / Trunk Assessment: Kyphotic   Balance Balance Overall balance assessment: Needs assistance Sitting-balance support: Bilateral upper  extremity supported;Feet supported Sitting balance-Leahy Scale: Poor Sitting balance - Comments: Pt with a posterior lean.  possibly leaning this way to try to relieve pelvic pain? Standing balance support: Bilateral upper extremity supported;During functional activity Standing balance-Leahy Scale: Poor Standing balance comment: Pt unable to hold self up w/o max outside assist. General Comments General comments (skin integrity, edema, etc.): Pt very confused, appears in pain but has difficultyy stating where the pain exactly is.  End of Session PT - End of Session Activity Tolerance: Patient limited by pain;Treatment limited secondary to agitation Patient left: in bed;with call bell/phone within reach;with bed alarm set Nurse Communication: Mobility status  GP     Rada Hay 11/12/2013, 2:21 PM Blanchard Kelch PT [phone]

## 2013-11-12 NOTE — Progress Notes (Signed)
[facility] PROGRESS NOTE  [name] OLM:[id number] DOB: 1935-08-24 DOA: 11/09/2013 PCP: Donnajean Lopes, MD  Assessment/Plan: ID:  - Will continue IV levaquin.  - Urine culture with >100.000 klebsiella sensitive to levaquin - WBC count had been trending down - Flu neg - Currently hemodynamically stable.   Resp:  - stable on RA. - Continue levaquin   MSK: - Palpable L sided lateral rib pain.  - Prn vicodin for pain.  Abd distension - Acute series reviewed - Appears to be questionable increased colonic distension on my own read - Will obtain dedicated abd xray to r/o obstruction in the setting of pain med use  Klebsiella UTI - On levaquin per above  Endo: SSI. A1C.   Rheum: Continue prednisone.   Neuro/psych: continue regimen.   FEN/GI: heart healthy diet.   Prophylaxis: subq heparin   Fall - Pt reported fell over the evening of 11/11/13 - Head CT w/o acute findings, hip xrays pending  Code Status:Full Code   Code Status: Full Family Communication: Pt in room (indicate person spoken with, relationship, and if by phone, the number) Disposition Plan: Pending  Antibiotics:  Tamiflu 11/09/13>>>11/11/13  Levaquin 11/09/13>>>   HPI/Subjective: No acute events. Pt continues with cough  Objective: Filed Vitals:   11/11/13 1516 11/11/13 2112 11/11/13 2155 11/12/13 0637  BP:  115/73 151/78 90/51  Pulse:  158 112 102  Temp:  98.1 F (36.7 C) 98 F (36.7 C) 98.1 F (36.7 C)  TempSrc:  Oral Oral Oral  Resp:  '24 20 20  ' Height: '5\' 3"'  (1.6 m)     Weight:      SpO2:  94% 92% 90%    Intake/Output Summary (Last 24 hours) at 11/12/13 0901 Last data filed at 11/12/13 0100  Gross per 24 hour  Intake 3881.34 ml  Output      0 ml  Net 3881.34 ml   Filed Weights   11/11/13 0521  Weight: 66.3 kg (146 lb 2.6 oz)    Exam:   General:  Awake, in nad  Cardiovascular: regualr, s1, s2  Respiratory: normal resp effort, no wheezing  Abdomen: soft,  nondistended  Musculoskeletal: perfused, no  clubbing   Data Reviewed: Basic Metabolic Panel:  Recent Labs Lab 11/09/13 1800 11/10/13 0525 11/11/13 0833 11/12/13 0545  NA 135* 137 134* 141  K 4.0 4.0 3.5* 3.6*  CL 93* 97 96 102  CO2 '23 24 25 25  ' GLUCOSE 119* 103* 133* 124*  BUN '10 8 6 7  ' CREATININE 0.60 0.56 0.48* 0.61  CALCIUM 9.0 8.4 8.2* 8.1*   Liver Function Tests:  Recent Labs Lab 11/10/13 0525 11/11/13 0833 11/12/13 0545  AST '12 11 15  ' ALT '8 9 14  ' ALKPHOS 65 61 58  BILITOT 1.0 0.6 0.5  PROT 6.8 6.4 6.0  ALBUMIN 3.0* 2.6* 2.6*   No results found for this basename: LIPASE, AMYLASE,  in the last 168 hours No results found for this basename: AMMONIA,  in the last 168 hours CBC:  Recent Labs Lab 11/09/13 1800 11/10/13 0525 11/11/13 0833 11/12/13 0545  WBC 13.0* 11.8* 9.4 8.9  NEUTROABS  --  9.6* 7.4 7.1  HGB 13.5 13.7 12.8 12.5  HCT 41.9 40.9 38.3 38.1  MCV 92.5 92.1 91.4 92.5  PLT 203 190 205 177   Cardiac Enzymes: No results found for this basename: CKTOTAL, CKMB, CKMBINDEX, TROPONINI,  in the last 168 hours BNP (last 3 results) No results found for this basename: PROBNP,  in  the last 8760 hours CBG:  Recent Labs Lab 11/11/13 0744 11/11/13 1143 11/11/13 1638 11/11/13 2134 11/12/13 0742  GLUCAP 102* 110* 85 101* 115*    Recent Results (from the past 240 hour(s))  URINE CULTURE     Status: None   Collection Time    11/09/13  5:49 PM      Result Value Range Status   Specimen Description URINE, CLEAN CATCH   Final   Special Requests Normal   Final   Culture  Setup Time     Final   Value: 11/10/2013 05:54     Performed at [place]     Final   Value: >=100,000 COLONIES/ML     Performed at [facility]   Culture     Final   Value: KLEBSIELLA PNEUMONIAE     Performed at [facility]   Report Status 11/12/2013 FINAL   Final   Organism ID, Bacteria KLEBSIELLA PNEUMONIAE   Final  CULTURE, BLOOD  (ROUTINE X 2)     Status: None   Collection Time    11/09/13  6:00 PM      Result Value Range Status   Specimen Description BLOOD RIGHT ARM   Final   Special Requests BOTTLES DRAWN AEROBIC ONLY 4ML   Final   Culture  Setup Time     Final   Value: 11/09/2013 22:08     Performed at [facility]   Culture     Final   Value:        BLOOD CULTURE RECEIVED NO GROWTH TO DATE CULTURE WILL BE HELD FOR 5 DAYS BEFORE ISSUING A FINAL NEGATIVE REPORT     Performed at [facility]   Report Status PENDING   Incomplete  CULTURE, BLOOD (ROUTINE X 2)     Status: None   Collection Time    11/09/13  6:05 PM      Result Value Range Status   Specimen Description BLOOD RIGHT FOREARM   Final   Special Requests BOTTLES DRAWN AEROBIC ONLY 3ML   Final   Culture  Setup Time     Final   Value: 11/09/2013 22:08     Performed at [facility]   Culture     Final   Value:        BLOOD CULTURE RECEIVED NO GROWTH TO DATE CULTURE WILL BE HELD FOR 5 DAYS BEFORE ISSUING A FINAL NEGATIVE REPORT     Performed at [facility]   Report Status PENDING   Incomplete  RESPIRATORY VIRUS PANEL     Status: None   Collection Time    11/09/13  9:50 PM      Result Value Range Status   Source - RVPAN NOSE   Final   Respiratory Syncytial Virus A NOT DETECTED   Final   Respiratory Syncytial Virus B NOT DETECTED   Final   Influenza A NOT DETECTED   Final   Influenza B NOT DETECTED   Final   Parainfluenza 1 NOT DETECTED   Final   Parainfluenza 2 NOT DETECTED   Final   Parainfluenza 3 NOT DETECTED   Final   Metapneumovirus NOT DETECTED   Final   Rhinovirus NOT DETECTED   Final   Adenovirus NOT DETECTED   Final   Influenza A H1 NOT DETECTED   Final   Influenza A H3 NOT DETECTED   Final   Comment: (NOTE)  Normal Reference Range for each Analyte: NOT DETECTED     Testing performed using the Luminex xTAG Respiratory Viral Panel test     kit.     This test was developed and its performance  characteristics determined     by [facility]. It has not been cleared or approved by the [place]     Food and Drug Administration. This test is used for clinical purposes.     It should not be regarded as investigational or for research. This     laboratory is certified under the [place] (CLIA) as qualified to perform high complexity     clinical laboratory testing.     Performed at [facility]     Studies: Dg Hip Bilateral W/pelvis  11/12/2013   CLINICAL DATA:  Bilateral hip pain after fall.  EXAM: BILATERAL HIP WITH PELVIS - 4+ VIEW  COMPARISON:  Abdominal radiograph 11/11/2013. Hip radiographs 07/21/2012.  FINDINGS: Prior left hip arthroplasty is again identified. There is no evidence of periprosthetic fracture or interval loosening. There is no evidence of dislocation. No pelvic fracture is identified. Mild right hip joint space narrowing is similar to the prior hip radiographs of 2013. No focal lytic or blastic osseous lesion is identified.  IMPRESSION: Unchanged appearance of the hips. No evidence of fracture or dislocation.   Electronically Signed   By: Logan Bores   On: 11/12/2013 08:47   Ct Head Wo Contrast  11/11/2013   CLINICAL DATA:  Fall today with left frontal hematoma and abrasion. Altered consciousness.  EXAM: CT HEAD WITHOUT CONTRAST  TECHNIQUE: Contiguous axial images were obtained from the base of the skull through the vertex without intravenous contrast.  COMPARISON:  CT HEAD W/O CM dated 02/09/2013  FINDINGS: Sinuses/Soft tissues: Left frontal scalp soft tissue swelling. Near complete opacification of the right maxillary sinus, new. At least partially secondary to mucosal thickening. Mucosal thickening of the left sphenoid sinus with fluid. This is increased.  No skull fracture. Clear mastoid air cells.  Despite multiple attempts, moderately motion degraded exam.  Intracranial: Moderate low density in the periventricular  white matter likely related to small vessel disease. Given the extent of motion degradation, no convincing evidence of acute intracranial abnormality. No mass lesion, hemorrhage, hydrocephalus, acute infarct, intra-axial, or extra-axial fluid collection.  IMPRESSION: 1. Motion degraded exam, despite multiple attempts. 2. Left frontal scalp soft tissue swelling. 3. Given motion, no convincing evidence of acute intracranial abnormality. Consider follow-up CT when patient is able to hold still. 4. Sinus opacification, favored to be related to progressive sinusitis. If there is a suspicion of facial fracture, consider dedicated CT.   Electronically Signed   By: Abigail Miyamoto M.D.   On: 11/11/2013 23:35   Dg Abd Portable 1v  11/11/2013   CLINICAL DATA:  Abdominal pain and nausea.  EXAM: PORTABLE ABDOMEN - 1 VIEW  COMPARISON:  Lumbar radiographs dated 07/17/2012 and scout image for CT scan of the abdomen dated 09/16/2011  FINDINGS: There is fairly extensive air scattered throughout nondistended loops of large and small bowel. No fecal impaction. No visible free air or free fluid on this supine radiograph. Degenerative changes and scoliosis of the lumbar spine appears stable. Left proximal femoral prosthesis is noted.  IMPRESSION: No acute abnormalities.   Electronically Signed   By: Rozetta Nunnery M.D.   On: 11/11/2013 17:03    Scheduled Meds: . amitriptyline  75 mg Oral QHS  .  buPROPion  75 mg Oral QAC breakfast  . cholecalciferol  5,000 Units Oral Daily  . heparin  5,000 Units Subcutaneous Q8H  . insulin aspart  0-5 Units Subcutaneous QHS  . insulin aspart  0-9 Units Subcutaneous TID WC  . levofloxacin (LEVAQUIN) IV  750 mg Intravenous Q24H  . mirtazapine  30 mg Oral QHS  . predniSONE  2.5 mg Oral Q breakfast  . sodium chloride  3 mL Intravenous Q12H   Continuous Infusions: . sodium chloride 100 mL/hr at 11/12/13 3702    Active Problems:   UTI (lower urinary tract infection)   Sepsis  Time spent:  28mn  Quintel Mccalla, [facility] Pager [phone] If 7PM-7AM, please contact night-coverage at [email or web address], password [facility]1/23/2015, 9:01 AM  LOS: 3 days

## 2013-11-13 LAB — COMPREHENSIVE METABOLIC PANEL
ALBUMIN: 2.1 g/dL — AB (ref 3.5–5.2)
ALK PHOS: 53 U/L (ref 39–117)
ALT: 10 U/L (ref 0–35)
AST: 10 U/L (ref 0–37)
BUN: 9 mg/dL (ref 6–23)
CHLORIDE: 101 meq/L (ref 96–112)
CO2: 29 mEq/L (ref 19–32)
CREATININE: 0.53 mg/dL (ref 0.50–1.10)
Calcium: 7.8 mg/dL — ABNORMAL LOW (ref 8.4–10.5)
GFR calc Af Amer: >90 mL/min (ref >90)
GFR calc non Af Amer: 90 mL/min — ABNORMAL LOW (ref >90)
Glucose, Bld: 117 mg/dL — ABNORMAL HIGH (ref 70–99)
Potassium: 3.3 mEq/L — ABNORMAL LOW (ref 3.7–5.3)
Sodium: 140 mEq/L (ref 137–147)
TOTAL PROTEIN: 5.3 g/dL — AB (ref 6.0–8.3)
Total Bilirubin: 0.4 mg/dL (ref 0.3–1.2)

## 2013-11-13 LAB — GLUCOSE, CAPILLARY
GLUCOSE-CAPILLARY: 156 mg/dL — AB (ref 70–99)
Glucose-Capillary: 110 mg/dL — ABNORMAL HIGH (ref 70–99)
Glucose-Capillary: 107 mg/dL — ABNORMAL HIGH (ref 70–99)
Glucose-Capillary: 93 mg/dL (ref 70–99)

## 2013-11-13 LAB — CBC WITH DIFFERENTIAL/PLATELET
Basophils Absolute: 0 10*3/uL (ref 0.0–0.1)
Basophils Relative: 1 % (ref 0–1)
EOS ABS: 0.1 10*3/uL (ref 0.0–0.7)
EOS PCT: 2 % (ref 0–5)
HEMATOCRIT: 33.2 % — AB (ref 36.0–46.0)
Hemoglobin: 10.8 g/dL — ABNORMAL LOW (ref 12.0–15.0)
LYMPHS ABS: 1 10*3/uL (ref 0.7–4.0)
LYMPHS PCT: 11 % — AB (ref 12–46)
MCH: 30 pg (ref 26.0–34.0)
MCHC: 32.5 g/dL (ref 30.0–36.0)
MCV: 92.2 fL (ref 78.0–100.0)
MONO ABS: 1.1 10*3/uL — AB (ref 0.1–1.0)
Monocytes Relative: 13 % — ABNORMAL HIGH (ref 3–12)
Neutro Abs: 6.5 10*3/uL (ref 1.7–7.7)
Neutrophils Relative %: 74 % (ref 43–77)
Platelets: 169 10*3/uL (ref 150–400)
RBC: 3.6 MIL/uL — AB (ref 3.87–5.11)
RDW: 13.5 % (ref 11.5–15.5)
WBC: 8.8 10*3/uL (ref 4.0–10.5)

## 2013-11-13 NOTE — Progress Notes (Signed)
[facility] PROGRESS NOTE  [name] PTW:[id number] DOB: [birth year] DOA: 11/09/2013 PCP: Donnajean Lopes, MD  Assessment/Plan: ID:  - Will continue with IV levaquin.  - Urine culture with >100.000 klebsiella sensitive to levaquin - WBC normalized - Flu neg - Currently hemodynamically stable.   Resp:  - stable on RA. - Continue levaquin   MSK: - Palpable L sided lateral rib pain.  - Prn vicodin for pain.  Abd distension - Acute series reviewed - follow up abd xray without significant findings  Klebsiella UTI - On levaquin per above  Endo: SSI. A1C.   Rheum: Continue prednisone.   Neuro/psych: continue regimen.   FEN/GI: heart healthy diet.   Prophylaxis: subq heparin   Fall - Pt reported fell over the evening of 11/11/13 - Head CT w/o acute findings, hip xrays pending  Code Status:Full Code   Code Status: Full Family Communication: Pt in room (indicate person spoken with, relationship, and if by phone, the number) Disposition Plan: Pending  Antibiotics:  Tamiflu 11/09/13>>>11/11/13  Levaquin 11/09/13>>>   HPI/Subjective: No acute events. Pt feels better.  Objective: Filed Vitals:   11/12/13 1446 11/12/13 2135 11/13/13 0629 11/13/13 0731  BP: 107/61 132/62 164/66 131/65  Pulse: 105 108 115 102  Temp: 98.4 F (36.9 C) 99.1 F (37.3 C) 97.7 F (36.5 C) 98.7 F (37.1 C)  TempSrc: Oral Oral Oral Oral  Resp: '18 18 18 18  ' Height:      Weight:   67.4 kg (148 lb 9.4 oz)   SpO2: 93% 91% 91% 97%    Intake/Output Summary (Last 24 hours) at 11/13/13 1125 Last data filed at 11/12/13 1300  Gross per 24 hour  Intake    240 ml  Output      0 ml  Net    240 ml   Filed Weights   11/11/13 0521 11/13/13 0629  Weight: 66.3 kg (146 lb 2.6 oz) 67.4 kg (148 lb 9.4 oz)    Exam:   General:  Awake, in nad  Cardiovascular: regualr, s1, s2  Respiratory: normal resp effort, no wheezing  Abdomen: soft, nondistended  Musculoskeletal: perfused,  no  clubbing   Data Reviewed: Basic Metabolic Panel:  Recent Labs Lab 11/09/13 1800 11/10/13 0525 11/11/13 0833 11/12/13 0545 11/13/13 0520  NA 135* 137 134* 141 140  K 4.0 4.0 3.5* 3.6* 3.3*  CL 93* 97 96 102 101  CO2 '23 24 25 25 29  ' GLUCOSE 119* 103* 133* 124* 117*  BUN '10 8 6 7 9  ' CREATININE 0.60 0.56 0.48* 0.61 0.53  CALCIUM 9.0 8.4 8.2* 8.1* 7.8*   Liver Function Tests:  Recent Labs Lab 11/10/13 0525 11/11/13 0833 11/12/13 0545 11/13/13 0520  AST '12 11 15 10  ' ALT '8 9 14 10  ' ALKPHOS 65 61 58 53  BILITOT 1.0 0.6 0.5 0.4  PROT 6.8 6.4 6.0 5.3*  ALBUMIN 3.0* 2.6* 2.6* 2.1*   No results found for this basename: LIPASE, AMYLASE,  in the last 168 hours No results found for this basename: AMMONIA,  in the last 168 hours CBC:  Recent Labs Lab 11/09/13 1800 11/10/13 0525 11/11/13 0833 11/12/13 0545 11/13/13 0520  WBC 13.0* 11.8* 9.4 8.9 8.8  NEUTROABS  --  9.6* 7.4 7.1 6.5  HGB 13.5 13.7 12.8 12.5 10.8*  HCT 41.9 40.9 38.3 38.1 33.2*  MCV 92.5 92.1 91.4 92.5 92.2  PLT 203 190 205 177 169   Cardiac Enzymes: No results found for this basename: CKTOTAL, CKMB,  CKMBINDEX, TROPONINI,  in the last 168 hours BNP (last 3 results) No results found for this basename: PROBNP,  in the last 8760 hours CBG:  Recent Labs Lab 11/12/13 0742 11/12/13 1127 11/12/13 1645 11/12/13 2004 11/13/13 0746  GLUCAP 115* 149* 153* 108* 110*    Recent Results (from the past 240 hour(s))  URINE CULTURE     Status: None   Collection Time    11/09/13  5:49 PM      Result Value Range Status   Specimen Description URINE, CLEAN CATCH   Final   Special Requests Normal   Final   Culture  Setup Time     Final   Value: 11/10/2013 05:54     Performed at [place]     Final   Value: >=100,000 COLONIES/ML     Performed at [facility]   Culture     Final   Value: KLEBSIELLA PNEUMONIAE     Performed at [facility]   Report Status  11/12/2013 FINAL   Final   Organism ID, Bacteria KLEBSIELLA PNEUMONIAE   Final  CULTURE, BLOOD (ROUTINE X 2)     Status: None   Collection Time    11/09/13  6:00 PM      Result Value Range Status   Specimen Description BLOOD RIGHT ARM   Final   Special Requests BOTTLES DRAWN AEROBIC ONLY 4ML   Final   Culture  Setup Time     Final   Value: 11/09/2013 22:08     Performed at [facility]   Culture     Final   Value:        BLOOD CULTURE RECEIVED NO GROWTH TO DATE CULTURE WILL BE HELD FOR 5 DAYS BEFORE ISSUING A FINAL NEGATIVE REPORT     Performed at [facility]   Report Status PENDING   Incomplete  CULTURE, BLOOD (ROUTINE X 2)     Status: None   Collection Time    11/09/13  6:05 PM      Result Value Range Status   Specimen Description BLOOD RIGHT FOREARM   Final   Special Requests BOTTLES DRAWN AEROBIC ONLY 3ML   Final   Culture  Setup Time     Final   Value: 11/09/2013 22:08     Performed at [facility]   Culture     Final   Value:        BLOOD CULTURE RECEIVED NO GROWTH TO DATE CULTURE WILL BE HELD FOR 5 DAYS BEFORE ISSUING A FINAL NEGATIVE REPORT     Performed at [facility]   Report Status PENDING   Incomplete  RESPIRATORY VIRUS PANEL     Status: None   Collection Time    11/09/13  9:50 PM      Result Value Range Status   Source - RVPAN NOSE   Final   Respiratory Syncytial Virus A NOT DETECTED   Final   Respiratory Syncytial Virus B NOT DETECTED   Final   Influenza A NOT DETECTED   Final   Influenza B NOT DETECTED   Final   Parainfluenza 1 NOT DETECTED   Final   Parainfluenza 2 NOT DETECTED   Final   Parainfluenza 3 NOT DETECTED   Final   Metapneumovirus NOT DETECTED   Final   Rhinovirus NOT DETECTED   Final   Adenovirus NOT DETECTED   Final   Influenza A H1 NOT DETECTED  Final   Influenza A H3 NOT DETECTED   Final   Comment: (NOTE)           Normal Reference Range for each Analyte: NOT DETECTED     Testing performed using the  Luminex xTAG Respiratory Viral Panel test     kit.     This test was developed and its performance characteristics determined     by [facility]. It has not been cleared or approved by the [place]     Food and Drug Administration. This test is used for clinical purposes.     It should not be regarded as investigational or for research. This     laboratory is certified under the [place] (CLIA) as qualified to perform high complexity     clinical laboratory testing.     Performed at [facility]     Studies: Dg Hip Bilateral W/pelvis  11/12/2013   CLINICAL DATA:  Bilateral hip pain after fall.  EXAM: BILATERAL HIP WITH PELVIS - 4+ VIEW  COMPARISON:  Abdominal radiograph 11/11/2013. Hip radiographs 07/21/2012.  FINDINGS: Prior left hip arthroplasty is again identified. There is no evidence of periprosthetic fracture or interval loosening. There is no evidence of dislocation. No pelvic fracture is identified. Mild right hip joint space narrowing is similar to the prior hip radiographs of 2013. No focal lytic or blastic osseous lesion is identified.  IMPRESSION: Unchanged appearance of the hips. No evidence of fracture or dislocation.   Electronically Signed   By: Logan Bores   On: 11/12/2013 08:47   Ct Head Wo Contrast  11/11/2013   CLINICAL DATA:  Fall today with left frontal hematoma and abrasion. Altered consciousness.  EXAM: CT HEAD WITHOUT CONTRAST  TECHNIQUE: Contiguous axial images were obtained from the base of the skull through the vertex without intravenous contrast.  COMPARISON:  CT HEAD W/O CM dated 02/09/2013  FINDINGS: Sinuses/Soft tissues: Left frontal scalp soft tissue swelling. Near complete opacification of the right maxillary sinus, new. At least partially secondary to mucosal thickening. Mucosal thickening of the left sphenoid sinus with fluid. This is increased.  No skull fracture. Clear mastoid air cells.  Despite multiple  attempts, moderately motion degraded exam.  Intracranial: Moderate low density in the periventricular white matter likely related to small vessel disease. Given the extent of motion degradation, no convincing evidence of acute intracranial abnormality. No mass lesion, hemorrhage, hydrocephalus, acute infarct, intra-axial, or extra-axial fluid collection.  IMPRESSION: 1. Motion degraded exam, despite multiple attempts. 2. Left frontal scalp soft tissue swelling. 3. Given motion, no convincing evidence of acute intracranial abnormality. Consider follow-up CT when patient is able to hold still. 4. Sinus opacification, favored to be related to progressive sinusitis. If there is a suspicion of facial fracture, consider dedicated CT.   Electronically Signed   By: Abigail Miyamoto M.D.   On: 11/11/2013 23:35   Dg Abd Portable 1v  11/11/2013   CLINICAL DATA:  Abdominal pain and nausea.  EXAM: PORTABLE ABDOMEN - 1 VIEW  COMPARISON:  Lumbar radiographs dated 07/17/2012 and scout image for CT scan of the abdomen dated 09/16/2011  FINDINGS: There is fairly extensive air scattered throughout nondistended loops of large and small bowel. No fecal impaction. No visible free air or free fluid on this supine radiograph. Degenerative changes and scoliosis of the lumbar spine appears stable. Left proximal femoral prosthesis is noted.  IMPRESSION: No acute abnormalities.   Electronically Signed  By: Rozetta Nunnery M.D.   On: 11/11/2013 17:03    Scheduled Meds: . amitriptyline  75 mg Oral QHS  . buPROPion  75 mg Oral QAC breakfast  . cholecalciferol  5,000 Units Oral Daily  . heparin  5,000 Units Subcutaneous Q8H  . insulin aspart  0-5 Units Subcutaneous QHS  . insulin aspart  0-9 Units Subcutaneous TID WC  . levofloxacin (LEVAQUIN) IV  750 mg Intravenous Q24H  . mirtazapine  30 mg Oral QHS  . predniSONE  2.5 mg Oral Q breakfast  . sodium chloride  3 mL Intravenous Q12H   Continuous Infusions: . sodium chloride 100 mL/hr at  11/12/13 1457    Active Problems:   UTI (lower urinary tract infection)   Sepsis  Time spent: 68mn  [facility], [facility] Pager [phone] If 7PM-7AM, please contact night-coverage at [email or web address], password T[facility]1/24/2015, 11:25 AM  LOS: 4 days

## 2013-11-14 DIAGNOSIS — F329 Major depressive disorder, single episode, unspecified: Secondary | ICD-10-CM

## 2013-11-14 DIAGNOSIS — F3289 Other specified depressive episodes: Secondary | ICD-10-CM

## 2013-11-14 DIAGNOSIS — J189 Pneumonia, unspecified organism: Secondary | ICD-10-CM

## 2013-11-14 LAB — CBC WITH DIFFERENTIAL/PLATELET
Basophils Absolute: 0.1 10*3/uL (ref 0.0–0.1)
Basophils Relative: 1 % (ref 0–1)
EOS PCT: 2 % (ref 0–5)
Eosinophils Absolute: 0.2 10*3/uL (ref 0.0–0.7)
HEMATOCRIT: 34.9 % — AB (ref 36.0–46.0)
Hemoglobin: 11.3 g/dL — ABNORMAL LOW (ref 12.0–15.0)
LYMPHS ABS: 1.5 10*3/uL (ref 0.7–4.0)
Lymphocytes Relative: 18 % (ref 12–46)
MCH: 29.9 pg (ref 26.0–34.0)
MCHC: 32.4 g/dL (ref 30.0–36.0)
MCV: 92.3 fL (ref 78.0–100.0)
MONOS PCT: 10 % (ref 3–12)
Monocytes Absolute: 0.8 10*3/uL (ref 0.1–1.0)
NEUTROS ABS: 5.6 10*3/uL (ref 1.7–7.7)
Neutrophils Relative %: 69 % (ref 43–77)
Platelets: 177 10*3/uL (ref 150–400)
RBC: 3.78 MIL/uL — AB (ref 3.87–5.11)
RDW: 13.6 % (ref 11.5–15.5)
WBC: 8.2 10*3/uL (ref 4.0–10.5)

## 2013-11-14 LAB — COMPREHENSIVE METABOLIC PANEL
ALBUMIN: 2.1 g/dL — AB (ref 3.5–5.2)
ALT: 11 U/L (ref 0–35)
AST: 13 U/L (ref 0–37)
Alkaline Phosphatase: 49 U/L (ref 39–117)
BUN: 5 mg/dL — ABNORMAL LOW (ref 6–23)
CO2: 30 mEq/L (ref 19–32)
CREATININE: 0.48 mg/dL — AB (ref 0.50–1.10)
Calcium: 8.1 mg/dL — ABNORMAL LOW (ref 8.4–10.5)
Chloride: 103 mEq/L (ref 96–112)
GFR calc Af Amer: >90 mL/min (ref >90)
GFR calc non Af Amer: >90 mL/min (ref >90)
Glucose, Bld: 91 mg/dL (ref 70–99)
POTASSIUM: 3.3 meq/L — AB (ref 3.7–5.3)
SODIUM: 143 meq/L (ref 137–147)
TOTAL PROTEIN: 5.4 g/dL — AB (ref 6.0–8.3)
Total Bilirubin: 0.4 mg/dL (ref 0.3–1.2)

## 2013-11-14 LAB — GLUCOSE, CAPILLARY
GLUCOSE-CAPILLARY: 97 mg/dL (ref 70–99)
GLUCOSE-CAPILLARY: 105 mg/dL — AB (ref 70–99)
GLUCOSE-CAPILLARY: 114 mg/dL — AB (ref 70–99)
Glucose-Capillary: 103 mg/dL — ABNORMAL HIGH (ref 70–99)

## 2013-11-14 MED ORDER — POTASSIUM CHLORIDE CRYS ER 20 MEQ PO TBCR
40.0000 meq | EXTENDED_RELEASE_TABLET | Freq: Two times a day (BID) | ORAL | Status: AC
  Administered 2013-11-14 (×2): 40 meq via ORAL
  Filled 2013-11-14 (×3): qty 2

## 2013-11-14 MED ORDER — LEVOFLOXACIN 750 MG PO TABS
750.0000 mg | ORAL_TABLET | ORAL | Status: DC
  Administered 2013-11-14 – 2013-11-16 (×3): 750 mg via ORAL
  Filled 2013-11-14 (×5): qty 1

## 2013-11-14 NOTE — Progress Notes (Signed)
Clinical Social Work Department BRIEF PSYCHOSOCIAL ASSESSMENT 11/14/2013  Patient:  [name], [name]     Account Number:  [id number]     Admit date:  11/09/2013  Clinical Social Worker:  Levie Heritage  Date/Time:  11/14/2013 04:04 PM  Referred by:  Physician  Date Referred:  11/14/2013 Referred for  SNF Placement   Other Referral:   Interview type:  Patient Other interview type:    PSYCHOSOCIAL DATA Living Status:  ALONE Admitted from facility:   Level of care:   Primary support name:  [name] Primary support relationship to patient:  CHILD, ADULT Degree of support available:   strong    CURRENT CONCERNS Current Concerns  Post-Acute Placement   Other Concerns:    SOCIAL WORK ASSESSMENT / PLAN Met with Pt to discuss d/c plans.    Pt is aware that PT and MD feel that SNF is needed upon d/c and, although she doesn't like to ask for help, Pt understands that SNF is the best option.    Pt reported that she has been to [facility] by hx and that she did not like that facility; she doesn't want to go back there.  She is agreeable to CSW beginning the SNF search.    CSW provided Pt with a SNF list.    CSW thanked Pt for her time.   Assessment/plan status:  Psychosocial Support/Ongoing Assessment of Needs Other assessment/ plan:   Information/referral to community resources:   SNF list    PATIENT'S/FAMILY'S RESPONSE TO PLAN OF CARE: Pt was calm, cooperative and very pleasant.    Pt expressed to CSW that she has been kinda down on herself for the lack of quality that she has in her life.  She stated that she feels that, once she's finished with rehab, she will feel more lively, have more energy and be able to have more quality of life.  Pt feels that SNF at d/c is a step in the right direction.    Pt thanked CSW for time and assistance.   Bernita Raisin, [place] [facility] [phone]

## 2013-11-14 NOTE — Progress Notes (Signed)
PHARMACIST - PHYSICIAN COMMUNICATION CONCERNING: Antibiotic IV to Oral Route Change Policy  RECOMMENDATION: This patient is receiving Levaquin by the intravenous route.  Based on criteria approved by the Pharmacy and Therapeutics Committee, the antibiotic(s) is/are being converted to the equivalent oral dose form(s).   DESCRIPTION: These criteria include:  Patient being treated for a respiratory tract infection, urinary tract infection, or cellulitis  The patient is not neutropenic and does not exhibit a GI malabsorption state  The patient is eating (either orally or via tube) and/or has been taking other orally administered medications for a least 24 hours  The patient is improving clinically and has a Tmax < 100.5  If you have questions about this conversion, please contact the Pharmacy Department  []   [phone] )  [phone] []   [phone]   []   [phone] )  [facility] [x]   [phone] )  [facility]    [facility], PharmD, BCPS Pager: [phone] 3:17 PM Pharmacy #: [phone]

## 2013-11-14 NOTE — Progress Notes (Addendum)
Clinical Social Work Department CLINICAL SOCIAL WORK PLACEMENT NOTE 11/14/2013  Patient:  [name], [name]  Account Number:  [id number] Admit date:  11/09/2013  Clinical Social Worker:  Doroteo Glassman  Date/time:  11/14/2013 04:09 PM  Clinical Social Work is seeking post-discharge placement for this patient at the following level of care:   SKILLED NURSING   (*CSW will update this form in Epic as items are completed)   11/14/2013  Patient/family provided with [facility] Department of Clinical Social Work's list of facilities offering this level of care within the geographic area requested by the patient (or if unable, by the patient's family).  11/14/2013  Patient/family informed of their freedom to choose among providers that offer the needed level of care, that participate in Medicare, Medicaid or managed care program needed by the patient, have an available bed and are willing to accept the patient.  11/14/2013  Patient/family informed of [facility]' ownership interest in [facility], as well as of the fact that they are under no obligation to receive care at this facility.  PASARR submitted to EDS on existing PASARR number received from EDS on   FL2 transmitted to all facilities in geographic area requested by pt/family on  11/14/2013 FL2 transmitted to all facilities within larger geographic area on   Patient informed that his/her managed care company has contracts with or will negotiate with  certain facilities, including the following:     Patient/family informed of bed offers received:  11/15/13 Patient chooses bed [name] Physician recommends and patient chooses bed at    Patient to be transferred to [name] on  11/17/13 Patient to be transferred to facility by [facility]  The following physician request were entered in Epic:   Additional Comments:  Providence Crosby, Theresia Majors Clinical Social Work [phone]

## 2013-11-14 NOTE — Progress Notes (Signed)
[facility] PROGRESS NOTE  [name] SNK:[id number] DOB: [birth year] DOA: 11/09/2013 PCP: Donnajean Lopes, MD  Assessment/Plan: ID:  - Will continue with IV levaquin.  - Urine culture with >100.000 klebsiella sensitive to levaquin - WBC normalized - Flu neg - Currently hemodynamically stable.   Resp:  - stable on RA. - Continue levaquin   MSK: - Palpable L sided lateral rib pain.  - Prn vicodin for pain.  Abd distension - Acute series reviewed - follow up abd xray without significant findings  Klebsiella UTI - On levaquin per above  Hypokalemia - Will replace  Endo: SSI. A1C.   Rheum: Continue prednisone.   Neuro/psych: continue regimen.   FEN/GI: heart healthy diet.   Prophylaxis: subq heparin   Fall - Pt reported fell over the evening of 11/11/13 - Head CT w/o acute findings, hip xrays pending  Code Status:Full Code   Code Status: Full Family Communication: Pt in room (indicate person spoken with, relationship, and if by phone, the number) Disposition Plan: Pending  Antibiotics:  Tamiflu 11/09/13>>>11/11/13  Levaquin 11/09/13>>>   HPI/Subjective: No acute events. Pt cont to feel better.  Objective: Filed Vitals:   11/13/13 0731 11/13/13 1341 11/13/13 2133 11/14/13 0629  BP: 131/65 148/74 153/78 146/77  Pulse: 102 102 102 110  Temp: 98.7 F (37.1 C) 97.9 F (36.6 C) 98 F (36.7 C) 97.9 F (36.6 C)  TempSrc: Oral Oral Oral Oral  Resp: '18 18 18 18  ' Height:      Weight:      SpO2: 97% 93% 97% 98%    Intake/Output Summary (Last 24 hours) at 11/14/13 0837 Last data filed at 11/14/13 0509  Gross per 24 hour  Intake 1576.48 ml  Output      0 ml  Net 1576.48 ml   Filed Weights   11/11/13 0521 11/13/13 0629  Weight: 66.3 kg (146 lb 2.6 oz) 67.4 kg (148 lb 9.4 oz)    Exam:   General:  Awake, in nad  Cardiovascular: regualr, s1, s2  Respiratory: normal resp effort, no wheezing  Abdomen: soft,  nondistended  Musculoskeletal: perfused, no  clubbing   Data Reviewed: Basic Metabolic Panel:  Recent Labs Lab 11/10/13 0525 11/11/13 0833 11/12/13 0545 11/13/13 0520 11/14/13 0537  NA 137 134* 141 140 143  K 4.0 3.5* 3.6* 3.3* 3.3*  CL 97 96 102 101 103  CO2 '24 25 25 29 30  ' GLUCOSE 937* 133* 124* 117* 91  BUN '8 6 7 9 ' 5*  CREATININE 0.56 0.48* 0.61 0.53 0.48*  CALCIUM 8.4 8.2* 8.1* 7.8* 8.1*   Liver Function Tests:  Recent Labs Lab 11/10/13 0525 11/11/13 0833 11/12/13 0545 11/13/13 0520 11/14/13 0537  AST '12 11 15 10 13  ' ALT '8 9 14 10 11  ' ALKPHOS 65 61 58 53 49  BILITOT 1.0 0.6 0.5 0.4 0.4  PROT 6.8 6.4 6.0 5.3* 5.4*  ALBUMIN 3.0* 2.6* 2.6* 2.1* 2.1*   No results found for this basename: LIPASE, AMYLASE,  in the last 168 hours No results found for this basename: AMMONIA,  in the last 168 hours CBC:  Recent Labs Lab 11/10/13 0525 11/11/13 0833 11/12/13 0545 11/13/13 0520 11/14/13 0537  WBC 11.8* 9.4 8.9 8.8 8.2  NEUTROABS 9.6* 7.4 7.1 6.5 5.6  HGB 13.7 12.8 12.5 10.8* 11.3*  HCT 40.9 38.3 38.1 33.2* 34.9*  MCV 92.1 91.4 92.5 92.2 92.3  PLT 190 205 177 169 177   Cardiac Enzymes: No results found for this basename:  CKTOTAL, CKMB, CKMBINDEX, TROPONINI,  in the last 168 hours BNP (last 3 results) No results found for this basename: PROBNP,  in the last 8760 hours CBG:  Recent Labs Lab 11/12/13 2004 11/13/13 0746 11/13/13 1149 11/13/13 1700 11/13/13 2040  GLUCAP 108* 110* 156* 107* 93    Recent Results (from the past 240 hour(s))  URINE CULTURE     Status: None   Collection Time    11/09/13  5:49 PM      Result Value Range Status   Specimen Description URINE, CLEAN CATCH   Final   Special Requests Normal   Final   Culture  Setup Time     Final   Value: 11/10/2013 05:54     Performed at [facility]     Final   Value: >=100,000 COLONIES/ML     Performed at [facility]   Culture     Final   Value:  KLEBSIELLA PNEUMONIAE     Performed at [facility]   Report Status 11/12/2013 FINAL   Final   Organism ID, Bacteria KLEBSIELLA PNEUMONIAE   Final  CULTURE, BLOOD (ROUTINE X 2)     Status: None   Collection Time    11/09/13  6:00 PM      Result Value Range Status   Specimen Description BLOOD RIGHT ARM   Final   Special Requests BOTTLES DRAWN AEROBIC ONLY 4ML   Final   Culture  Setup Time     Final   Value: 11/09/2013 22:08     Performed at [facility]   Culture     Final   Value:        BLOOD CULTURE RECEIVED NO GROWTH TO DATE CULTURE WILL BE HELD FOR 5 DAYS BEFORE ISSUING A FINAL NEGATIVE REPORT     Performed at [facility]   Report Status PENDING   Incomplete  CULTURE, BLOOD (ROUTINE X 2)     Status: None   Collection Time    11/09/13  6:05 PM      Result Value Range Status   Specimen Description BLOOD RIGHT FOREARM   Final   Special Requests BOTTLES DRAWN AEROBIC ONLY 3ML   Final   Culture  Setup Time     Final   Value: 11/09/2013 22:08     Performed at [facility]   Culture     Final   Value:        BLOOD CULTURE RECEIVED NO GROWTH TO DATE CULTURE WILL BE HELD FOR 5 DAYS BEFORE ISSUING A FINAL NEGATIVE REPORT     Performed at [facility]   Report Status PENDING   Incomplete  RESPIRATORY VIRUS PANEL     Status: None   Collection Time    11/09/13  9:50 PM      Result Value Range Status   Source - RVPAN NOSE   Final   Respiratory Syncytial Virus A NOT DETECTED   Final   Respiratory Syncytial Virus B NOT DETECTED   Final   Influenza A NOT DETECTED   Final   Influenza B NOT DETECTED   Final   Parainfluenza 1 NOT DETECTED   Final   Parainfluenza 2 NOT DETECTED   Final   Parainfluenza 3 NOT DETECTED   Final   Metapneumovirus NOT DETECTED   Final   Rhinovirus NOT DETECTED   Final   Adenovirus NOT DETECTED   Final   Influenza A H1 NOT  DETECTED   Final   Influenza A H3 NOT DETECTED   Final   Comment: (NOTE)           Normal  Reference Range for each Analyte: NOT DETECTED     Testing performed using the Luminex xTAG Respiratory Viral Panel test     kit.     This test was developed and its performance characteristics determined     by [facility]. It has not been cleared or approved by the [place]     Food and Drug Administration. This test is used for clinical purposes.     It should not be regarded as investigational or for research. This     laboratory is certified under the [place] (CLIA) as qualified to perform high complexity     clinical laboratory testing.     Performed at [facility]     Studies: No results found.  Scheduled Meds: . amitriptyline  75 mg Oral QHS  . buPROPion  75 mg Oral QAC breakfast  . cholecalciferol  5,000 Units Oral Daily  . heparin  5,000 Units Subcutaneous Q8H  . insulin aspart  0-5 Units Subcutaneous QHS  . insulin aspart  0-9 Units Subcutaneous TID WC  . levofloxacin (LEVAQUIN) IV  750 mg Intravenous Q24H  . mirtazapine  30 mg Oral QHS  . potassium chloride  40 mEq Oral BID  . predniSONE  2.5 mg Oral Q breakfast  . sodium chloride  3 mL Intravenous Q12H   Continuous Infusions: . sodium chloride 100 mL/hr at 11/14/13 0509    Active Problems:   UTI (lower urinary tract infection)   Sepsis  Time spent: 46mn  [facility], [facility] Pager [phone] If 7PM-7AM, please contact night-coverage at [email or web address], password [email or web address]/25/2015, 8:37 AM  LOS: 5 days

## 2013-11-15 ENCOUNTER — Inpatient Hospital Stay (HOSPITAL_COMMUNITY): Payer: Medicare Other

## 2013-11-15 DIAGNOSIS — R627 Adult failure to thrive: Secondary | ICD-10-CM

## 2013-11-15 LAB — GLUCOSE, CAPILLARY
GLUCOSE-CAPILLARY: 103 mg/dL — AB (ref 70–99)
GLUCOSE-CAPILLARY: 143 mg/dL — AB (ref 70–99)
Glucose-Capillary: 112 mg/dL — ABNORMAL HIGH (ref 70–99)
Glucose-Capillary: 128 mg/dL — ABNORMAL HIGH (ref 70–99)
Glucose-Capillary: 89 mg/dL (ref 70–99)

## 2013-11-15 LAB — CBC WITH DIFFERENTIAL/PLATELET
BASOS ABS: 0.1 10*3/uL (ref 0.0–0.1)
Basophils Relative: 1 % (ref 0–1)
Eosinophils Absolute: 0.3 10*3/uL (ref 0.0–0.7)
Eosinophils Relative: 3 % (ref 0–5)
HEMATOCRIT: 36.3 % (ref 36.0–46.0)
Hemoglobin: 11.9 g/dL — ABNORMAL LOW (ref 12.0–15.0)
Lymphocytes Relative: 18 % (ref 12–46)
Lymphs Abs: 1.6 10*3/uL (ref 0.7–4.0)
MCH: 30.3 pg (ref 26.0–34.0)
MCHC: 32.8 g/dL (ref 30.0–36.0)
MCV: 92.4 fL (ref 78.0–100.0)
MONOS PCT: 11 % (ref 3–12)
Monocytes Absolute: 1 10*3/uL (ref 0.1–1.0)
NEUTROS PCT: 67 % (ref 43–77)
Neutro Abs: 6.1 10*3/uL (ref 1.7–7.7)
PLATELETS: 195 10*3/uL (ref 150–400)
RBC: 3.93 MIL/uL (ref 3.87–5.11)
RDW: 13.6 % (ref 11.5–15.5)
WBC: 9.1 10*3/uL (ref 4.0–10.5)

## 2013-11-15 LAB — CULTURE, BLOOD (ROUTINE X 2)
Culture: NO GROWTH
Culture: NO GROWTH

## 2013-11-15 LAB — COMPREHENSIVE METABOLIC PANEL
ALT: 13 U/L (ref 0–35)
AST: 15 U/L (ref 0–37)
Albumin: 2.3 g/dL — ABNORMAL LOW (ref 3.5–5.2)
Alkaline Phosphatase: 52 U/L (ref 39–117)
BILIRUBIN TOTAL: 0.3 mg/dL (ref 0.3–1.2)
BUN: 6 mg/dL (ref 6–23)
CO2: 33 mEq/L — ABNORMAL HIGH (ref 19–32)
Calcium: 8.8 mg/dL (ref 8.4–10.5)
Chloride: 99 mEq/L (ref 96–112)
Creatinine, Ser: 0.47 mg/dL — ABNORMAL LOW (ref 0.50–1.10)
GFR calc Af Amer: >90 mL/min (ref >90)
GFR calc non Af Amer: >90 mL/min (ref >90)
Glucose, Bld: 96 mg/dL (ref 70–99)
POTASSIUM: 4 meq/L (ref 3.7–5.3)
Sodium: 140 mEq/L (ref 137–147)
TOTAL PROTEIN: 5.8 g/dL — AB (ref 6.0–8.3)

## 2013-11-15 NOTE — Progress Notes (Signed)
Physical Therapy Treatment Patient Details Name: [name] MRN: [id number] DOB: 05-20-1935 Today's Date: 11/15/2013 Time: 3557-3220 PT Time Calculation (min): 21 min  PT Assessment / Plan / Recommendation  History of Present Illness [name] is a pleasant 79 yo female who lives alone independently.  Pmhx is as below.  She does have a history of requiring admission for uti.  She is on chronic prednisone for RA for a long time,  7 or 8 years in all.  She had some loss of control of bowels three days ago. Then two  Days ago she felt okay.  Then today, her daughter called her and she was in bed, and confused and she had urine incontinence in the bed.  She does report some dysuria in the last 24 hrs.  No nausea or vomiting.  No sob.  Yesterday she reported a bit of cp for a time, not today.  No blood noted above or below.   PT Comments   Pt   Assisted from recliner to bsc then to bed. Pt reports being tired. States L shoulder is hurting. Pt did not use  A rolling walker today as pt required assist of 2 persons to stand from recliner.  Follow Up Recommendations  SNF;Supervision/Assistance - 24 hour     Does the patient have the potential to tolerate intense rehabilitation     Barriers to Discharge        Equipment Recommendations  None recommended by PT    Recommendations for Other Services    Frequency Min 3X/week   Progress towards PT Goals Progress towards PT goals: Progressing toward goals  Plan Current plan remains appropriate    Precautions / Restrictions Precautions Precautions: Fall   Pertinent Vitals/Pain Reports L shoulder being sore.    Mobility  Bed Mobility Sit to supine: Max assist General bed mobility comments: pt assisted with legs onto bed and trunk  Transfers Overall transfer level: Needs assistance Equipment used: 2 person hand held assist Transfers: Sit to/from Stand;Stand Pivot Transfers Sit to Stand: +2 physical assistance;+2 safety/equipment;From elevated  surface Stand pivot transfers: +2 physical assistance;+2 safety/equipment General transfer comment: pt is weak to stand, bear hug technique to stand  from recliner  and from [facility]. pt did pivot around  to get to [facility] then Bed.     Exercises     PT Diagnosis:    PT Problem List:   PT Treatment Interventions:     PT Goals (current goals can now be found in the care plan section)    Visit Information  Last PT Received On: 11/15/13 Assistance Needed: +2 History of Present Illness: [name] is a pleasant 79 yo female who lives alone independently.  Pmhx is as below.  She does have a history of requiring admission for uti.  She is on chronic prednisone for RA for a long time,  7 or 8 years in all.  She had some loss of control of bowels three days ago. Then two  Days ago she felt okay.  Then today, her daughter called her and she was in bed, and confused and she had urine incontinence in the bed.  She does report some dysuria in the last 24 hrs.  No nausea or vomiting.  No sob.  Yesterday she reported a bit of cp for a time, not today.  No blood noted above or below.    Subjective Data      Cognition  Cognition Arousal/Alertness: Awake/alert Behavior During Therapy: Restless Overall Cognitive  Status: Impaired/Different from baseline Problem Solving: Slow processing    Balance  Balance Sitting-balance support: Bilateral upper extremity supported Sitting balance-Leahy Scale: Fair Sitting balance - Comments: pt sits with her feet under the recliner.  End of Session PT - End of Session Equipment Utilized During Treatment: Gait belt Activity Tolerance: Patient limited by pain Patient left: in bed;with call bell/phone within reach;with family/visitor present Nurse Communication: Mobility status, RN in room and aware of pt is requiring 2 persons for transfers.   GP     Rada Hay 11/15/2013, 4:16 PM Blanchard Kelch PT [phone]

## 2013-11-15 NOTE — Progress Notes (Signed)
Late Entry for 11/09/13:  Family was visiting with patient late this afternoon. The entire day before this time, we had kept the bed alarm on for safety.  Bed alarm went off while patient was visiting with family.  We agreed to turn off bed alarm only while patient was visiting with family.  I communicated to patient's daughter the importance of letting us know when she left the patient so that we could turn the bed alarm back on for safety.

## 2013-11-15 NOTE — Progress Notes (Signed)
[facility] PROGRESS NOTE  [name] WYO:[id number] DOB: 04-Dec-1934 DOA: 11/09/2013 PCP: Donnajean Lopes, MD  Assessment/Plan: ID:  - Will continue with IV levaquin for now.  - Urine culture with >100.000 klebsiella sensitive to levaquin - WBC normalized - Flu neg - Remains hemodynamically stable.   Resp:  - stable on RA. - Continue levaquin   MSK: - Palpable L sided lateral rib pain.  - no rib fx on rib xray - Prn vicodin for pain.  Abd distension - Acute series reviewed - follow up abd xray without significant findings  Klebsiella UTI - On levaquin per above  Hypokalemia - Will replace  Endo: SSI. A1C.   Rheum: Continue prednisone.   Neuro/psych: continue regimen.   FEN/GI: heart healthy diet.   Prophylaxis: subq heparin   Fall - Pt reported fell over the evening of 11/11/13 - Head CT w/o acute findings, hip xrays without fracture  Code Status:Full Code   Code Status: Full Family Communication: Pt in room, updated daughter in room yesterday (indicate person spoken with, relationship, and if by phone, the number) Disposition Plan: Pending SNF  Antibiotics:  Tamiflu 11/09/13>>>11/11/13  Levaquin 11/09/13>>>   HPI/Subjective: No acute events. Pt feels well.  Objective: Filed Vitals:   11/14/13 1430 11/14/13 2146 11/15/13 0521 11/15/13 0605  BP: 156/87 144/64  122/75  Pulse: 81 110  108  Temp: 98.4 F (36.9 C) 98.2 F (36.8 C)  97.2 F (36.2 C)  TempSrc: Oral Oral  Oral  Resp: _0 Height:      Weight:   69.8 kg (153 lb 14.1 oz)   SpO2: 97% 97%  95%    Intake/Output Summary (Last 24 hours) at 11/15/13 1000 Last data filed at 11/14/13 2311  Gross per 24 hour  Intake    120 ml  Output   1825 ml  Net  -1705 ml   Filed Weights   11/11/13 0521 11/13/13 0629 11/15/13 0521  Weight: 66.3 kg (146 lb 2.6 oz) 67.4 kg (148 lb 9.4 oz) 69.8 kg (153 lb 14.1 oz)    Exam:   General:  Awake, in nad  Cardiovascular: regualr, s1,  s2  Respiratory: normal resp effort, no wheezing  Abdomen: soft, nondistended  Musculoskeletal: perfused, no  clubbing   Data Reviewed: Basic Metabolic Panel:  Recent Labs Lab 11/11/13 0833 11/12/13 0545 11/13/13 0520 11/14/13 0537 11/15/13 0525  NA 134* 141 140 143 140  K 3.5* 3.6* 3.3* 3.3* 4.0  CL 96 102 101 103 99  CO2 _1 33*  GLUCOSE 133* 124* 117* 91 96  BUN _2 5* 6  CREATININE 0.48* 0.61 0.53 0.48* 0.47*  CALCIUM 8.2* 8.1* 7.8* 8.1* 8.8   Liver Function Tests:  Recent Labs Lab 11/11/13 7741 11/12/13 0545 11/13/13 0520 11/14/13 0537 11/15/13 0525  AST _3 ALT _4 ALKPHOS 61 58 53 49 52  BILITOT 0.6 0.5 0.4 0.4 0.3  PROT 6.4 6.0 5.3* 5.4* 5.8*  ALBUMIN 2.6* 2.6* 2.1* 2.1* 2.3*   No results found for this basename: LIPASE, AMYLASE,  in the last 168 hours No results found for this basename: AMMONIA,  in the last 168 hours CBC:  Recent Labs Lab 11/11/13 0833 11/12/13 0545 11/13/13 0520 11/14/13 0537 11/15/13 0525  WBC 9.4 8.9 8.8 8.2 9.1  NEUTROABS 7.4 7.1 6.5 5.6 6.1  HGB 12.8 12.5 10.8* 11.3* 11.9*  HCT 38.3 38.1 33.2* 34.9*  36.3  MCV 91.4 92.5 92.2 92.3 92.4  PLT 205 177 169 177 195   Cardiac Enzymes: No results found for this basename: CKTOTAL, CKMB, CKMBINDEX, TROPONINI,  in the last 168 hours BNP (last 3 results) No results found for this basename: PROBNP,  in the last 8760 hours CBG:  Recent Labs Lab 11/14/13 0757 11/14/13 1201 11/14/13 1725 11/14/13 2027 11/15/13 0747  GLUCAP 97 105* 114* 103* 103*    Recent Results (from the past 240 hour(s))  URINE CULTURE     Status: None   Collection Time    11/09/13  5:49 PM      Result Value Range Status   Specimen Description URINE, CLEAN CATCH   Final   Special Requests Normal   Final   Culture  Setup Time     Final   Value: 11/10/2013 05:54     Performed at [facility] [place]     Final   Value: >=100,000 COLONIES/ML      Performed at [facility]   Culture     Final   Value: KLEBSIELLA PNEUMONIAE     Performed at [facility]   Report Status 11/12/2013 FINAL   Final   Organism ID, Bacteria KLEBSIELLA PNEUMONIAE   Final  CULTURE, BLOOD (ROUTINE X 2)     Status: None   Collection Time    11/09/13  6:00 PM      Result Value Range Status   Specimen Description BLOOD RIGHT ARM   Final   Special Requests BOTTLES DRAWN AEROBIC ONLY 4ML   Final   Culture  Setup Time     Final   Value: 11/09/2013 22:08     Performed at [facility]   Culture     Final   Value: NO GROWTH 5 DAYS     Performed at [facility]   Report Status 11/15/2013 FINAL   Final  CULTURE, BLOOD (ROUTINE X 2)     Status: None   Collection Time    11/09/13  6:05 PM      Result Value Range Status   Specimen Description BLOOD RIGHT FOREARM   Final   Special Requests BOTTLES DRAWN AEROBIC ONLY 3ML   Final   Culture  Setup Time     Final   Value: 11/09/2013 22:08     Performed at [facility]   Culture     Final   Value: NO GROWTH 5 DAYS     Performed at [facility]   Report Status 11/15/2013 FINAL   Final  RESPIRATORY VIRUS PANEL     Status: None   Collection Time    11/09/13  9:50 PM      Result Value Range Status   Source - RVPAN NOSE   Final   Respiratory Syncytial Virus A NOT DETECTED   Final   Respiratory Syncytial Virus B NOT DETECTED   Final   Influenza A NOT DETECTED   Final   Influenza B NOT DETECTED   Final   Parainfluenza 1 NOT DETECTED   Final   Parainfluenza 2 NOT DETECTED   Final   Parainfluenza 3 NOT DETECTED   Final   Metapneumovirus NOT DETECTED   Final   Rhinovirus NOT DETECTED   Final   Adenovirus NOT DETECTED   Final   Influenza A H1 NOT DETECTED   Final   Influenza A H3 NOT DETECTED   Final   Comment: (NOTE)  Normal Reference Range for each Analyte: NOT DETECTED     Testing performed using the Luminex xTAG Respiratory Viral Panel test     kit.      This test was developed and its performance characteristics determined     by [facility]. It has not been cleared or approved by the [place]     Food and Drug Administration. This test is used for clinical purposes.     It should not be regarded as investigational or for research. This     laboratory is certified under the [place] (CLIA) as qualified to perform high complexity     clinical laboratory testing.     Performed at [facility]     Studies: No results found.  Scheduled Meds: . amitriptyline  75 mg Oral QHS  . buPROPion  75 mg Oral QAC breakfast  . cholecalciferol  5,000 Units Oral Daily  . heparin  5,000 Units Subcutaneous Q8H  . insulin aspart  0-5 Units Subcutaneous QHS  . insulin aspart  0-9 Units Subcutaneous TID WC  . levofloxacin  750 mg Oral Q24H  . mirtazapine  30 mg Oral QHS  . predniSONE  2.5 mg Oral Q breakfast  . sodium chloride  3 mL Intravenous Q12H   Continuous Infusions: . sodium chloride 100 mL/hr at 11/15/13 3212    Active Problems:   UTI (lower urinary tract infection)   Sepsis  Time spent: 11mn  , [facility] Pager [phone] If 7PM-7AM, please contact night-coverage at [email or web address], password [email or web address]1/26/2015, 10:00 AM  LOS: 6 days

## 2013-11-15 NOTE — Progress Notes (Signed)
Clinical Social Work  CSW met with patient and dtr at bedside. CSW provided bed offers and discussed DC process. Dtr wishes to discuss DC plans with MD and director prior to DC due to patient's fall. CSW informed MD that dtr wanted to talk with him. Dtr reports she will tour facilities and will talk with patient re: choice. CSW will continue to follow and will assist with DC once medically stable.  [place], [place] [phone]

## 2013-11-15 NOTE — Progress Notes (Signed)
11/15/13 1500  Clinical Encounter Type  Visited With Family;Patient;Health care provider (primarily daughter [name]; also pt, RN)  Visit Type Spiritual support;Social support  Referral From Nurse (nursing huddle)  Spiritual Encounters  Spiritual Needs Emotional  Stress Factors  Patient Stress Factors Health changes;Major life changes (likely rehab)  Family Stress Factors Loss of control;Major life changes (along with her SO, dtr [name] is primary support person)   Visited with briefly with [name], who dozed while daughter [name] and I continued a visit.  Per [name], she and her SO of 17 years are the primary supporters for pt.  Provided pastoral presence and reflective listening as [name] shared about her hopes for her mom and her emotions related to coping.  Returned while [name] was sleeping; left a prayer shawl with RN as a tangible sign of care, comfort, and encouragement for pt.  Family is aware of ongoing chaplain availability for spiritual and emotional support, but please also page as needed:  [phone].  Thank you.  [address] [place], [place] [phone]

## 2013-11-15 NOTE — Progress Notes (Signed)
Pt with continued shoulder pains today. This was previously addressed and appeared to be a chronic process in the setting of known arthritis. X ray shoulder was done which demonstrated superior subluxation of humeral head with suspected supraspinatus tendon injury. Case was discussed with Orthopedic surgery who reports findings are consistent with a chronic, not acute, injury. Orthopedic surgery to see patient tomorrow.

## 2013-11-15 NOTE — Progress Notes (Signed)
Clinical Social Work  CSW spoke with dtr who reports she and patient have chosen [name] for SNF. CSW called SNF who is agreeable to accept patient once medically stable.  CSW will continue to follow.  Unk Lightning, LCSW (Coverage for [employer])

## 2013-11-16 ENCOUNTER — Inpatient Hospital Stay (HOSPITAL_COMMUNITY): Payer: Medicare Other

## 2013-11-16 DIAGNOSIS — E2749 Other adrenocortical insufficiency: Secondary | ICD-10-CM

## 2013-11-16 LAB — CBC WITH DIFFERENTIAL/PLATELET
BASOS PCT: 1 % (ref 0–1)
Basophils Absolute: 0.1 10*3/uL (ref 0.0–0.1)
EOS PCT: 3 % (ref 0–5)
Eosinophils Absolute: 0.2 10*3/uL (ref 0.0–0.7)
HCT: 34.8 % — ABNORMAL LOW (ref 36.0–46.0)
HEMOGLOBIN: 11.4 g/dL — AB (ref 12.0–15.0)
LYMPHS PCT: 21 % (ref 12–46)
Lymphs Abs: 1.7 10*3/uL (ref 0.7–4.0)
MCH: 30.6 pg (ref 26.0–34.0)
MCHC: 32.8 g/dL (ref 30.0–36.0)
MCV: 93.3 fL (ref 78.0–100.0)
Monocytes Absolute: 0.8 10*3/uL (ref 0.1–1.0)
Monocytes Relative: 10 % (ref 3–12)
NEUTROS PCT: 65 % (ref 43–77)
Neutro Abs: 5.4 10*3/uL (ref 1.7–7.7)
Platelets: 199 10*3/uL (ref 150–400)
RBC: 3.73 MIL/uL — ABNORMAL LOW (ref 3.87–5.11)
RDW: 14 % (ref 11.5–15.5)
WBC: 8.2 10*3/uL (ref 4.0–10.5)

## 2013-11-16 LAB — COMPREHENSIVE METABOLIC PANEL
ALT: 9 U/L (ref 0–35)
AST: 12 U/L (ref 0–37)
Albumin: 2.1 g/dL — ABNORMAL LOW (ref 3.5–5.2)
Alkaline Phosphatase: 46 U/L (ref 39–117)
BUN: 8 mg/dL (ref 6–23)
CALCIUM: 8.4 mg/dL (ref 8.4–10.5)
CO2: 31 meq/L (ref 19–32)
CREATININE: 0.55 mg/dL (ref 0.50–1.10)
Chloride: 102 mEq/L (ref 96–112)
GFR, EST NON AFRICAN AMERICAN: 89 mL/min — AB (ref >90)
Glucose, Bld: 100 mg/dL — ABNORMAL HIGH (ref 70–99)
Potassium: 4.2 mEq/L (ref 3.7–5.3)
Sodium: 143 mEq/L (ref 137–147)
Total Bilirubin: 0.2 mg/dL — ABNORMAL LOW (ref 0.3–1.2)
Total Protein: 5.2 g/dL — ABNORMAL LOW (ref 6.0–8.3)

## 2013-11-16 LAB — GLUCOSE, CAPILLARY
GLUCOSE-CAPILLARY: 185 mg/dL — AB (ref 70–99)
Glucose-Capillary: 122 mg/dL — ABNORMAL HIGH (ref 70–99)
Glucose-Capillary: 153 mg/dL — ABNORMAL HIGH (ref 70–99)
Glucose-Capillary: 144 mg/dL — ABNORMAL HIGH (ref 70–99)

## 2013-11-16 MED ORDER — LEVOFLOXACIN 750 MG PO TABS: 750.0000 mg | ORAL_TABLET | ORAL | Status: DC

## 2013-11-16 NOTE — Consult Note (Signed)
NAME: [name] MRN:   [id number] DOB:   May 08, 1935     HISTORY AND PHYSICAL  CHIEF COMPLAINT:  Left shoulder pain  HISTORY:   [name] is a 79 y.o. female  Admitted to hospital for UTI.  Had reported fall over the evening of 11/11/13. Head CT w/o acute findings, hip xrays without fracture. Ortho consulted for shoulder pain worsening after the fall.  Past medical history significant for a left shoulder rotator cuff repair. Patient unsure of when.  Patient states that it was done by Dr. Darrelyn Hillock.     PAST MEDICAL HISTORY:   Past Medical History  Diagnosis Date  . Osteoporosis   . Rheumatoid arthritis(714.0)   . Murmur, cardiac   . Pneumonia   . Anxiety   . Adrenal insufficiency, primary, iatrogenic   . Gait instability   . Physical deconditioning   . SOBOE (shortness of breath on exertion)     echo 02/23/13-EF 55-60%, possible bicuspid aortic valve with mod to severed stenosis   . Palpitations   . Chest pain     myoview 04/13/04-inferoapical thinning with a suggestion of mild apical ischemia, EF 66%  . Syncope   . Aortic stenosis, severe 02/26/2013  . Pancreatic insufficiency 11/09/2012  . UTI (lower urinary tract infection) 10/01/2011  . Hearing loss 03/25/2013  . Memory deficit 03/25/2013  . Depression 03/25/2013  . Pulmonary fibrosis 03/25/2013  . Coronary artery disease     PAST SURGICAL HISTORY:   Past Surgical History  Procedure Laterality Date  . Knee arthroscopy      2 on left  . Cholecystectomy    . Thyroidectomy    . Abdominal hysterectomy    . Cardiac catheterization  11/08/96    no evidence of CAD, nl LV function  . Shoulder surgery Left     MEDICATIONS:   Medications Prior to Admission  Medication Sig Dispense Refill  . ALPRAZolam (XANAX) 1 MG tablet Take 1 mg by mouth 3 (three) times daily as needed for anxiety.       Marland Kitchen amitriptyline (ELAVIL) 25 MG tablet Take 75 mg by mouth at bedtime.       Marland Kitchen buPROPion (WELLBUTRIN) 75 MG tablet Take 75 mg by mouth  every morning.       . Cholecalciferol (VITAMIN D3) 5000 UNITS CAPS Take 5,000 Units by mouth daily.      . furosemide (LASIX) 20 MG tablet Take 20 mg by mouth daily.      . Menaquinone-7 (VITAMIN K2 PO) Take 1 tablet by mouth daily.      . mirtazapine (REMERON) 30 MG tablet Take 30 mg by mouth at bedtime.       . predniSONE (DELTASONE) 5 MG tablet Take 2.5 mg by mouth daily with breakfast.      . Polyethyl Glycol-Propyl Glycol (SYSTANE OP) Place 1 drop into both eyes 2 (two) times daily as needed (dry eyes).         ALLERGIES:   Allergies  Allergen Reactions  . Penicillins Hives    REVIEW OF SYSTEMS:   Negative except HPI  FAMILY HISTORY:   Family History  Problem Relation Age of Onset  . Stroke Brother   . Cancer - Colon Father   . Cancer Sister     spine?    SOCIAL HISTORY:   reports that she has never smoked. She has never used smokeless tobacco. She reports that she does not drink alcohol or use illicit drugs.  PHYSICAL EXAM:  General appearance: alert, cooperative and no distress Resp: clear to auscultation bilaterally Extremities: extremities normal, atraumatic, no cyanosis or edema and Homans sign is negative, no sign of DVT Pulses: 2+ and symmetric  Left shoulder: positive hawkin's positive neer's   LABORATORY STUDIES:  Recent Labs  11/15/13 0525 11/16/13 0455  WBC 9.1 8.2  HGB 11.9* 11.4*  HCT 36.3 34.8*  PLT 195 199     Recent Labs  11/15/13 0525 11/16/13 0455  NA 140 143  K 4.0 4.2  CL 99 102  CO2 33* 31  GLUCOSE 96 100*  BUN 6 8  CREATININE 0.47* 0.55  CALCIUM 8.8 8.4    STUDIES/RESULTS:  X-ray Chest Pa And Lateral   11/10/2013   CLINICAL DATA:  Cough, congestion and weakness.  EXAM: CHEST  2 VIEW  COMPARISON:  Chest radiograph performed 11/09/2013  FINDINGS: The lungs are well-aerated. Mild left basilar opacity may reflect atelectasis or possibly mild pneumonia. There is no evidence of pleural effusion or pneumothorax.  The heart is  borderline normal in size. A stent graft is seen overlying the mediastinum. No acute osseous abnormalities are seen. Clips are noted within the right upper quadrant, reflecting prior cholecystectomy.  IMPRESSION: Mild left basilar airspace opacity may reflect atelectasis or possibly mild pneumonia.   Electronically Signed   By: Roanna Raider M.D.   On: 11/10/2013 05:31   Dg Chest 2 View  11/09/2013   CLINICAL DATA:  Shortness of breath, cough, weakness  EXAM: CHEST  2 VIEW  COMPARISON:  Prior radiograph from 09/15/2013  FINDINGS: Vascular stent again projects over the heart. Cardiac and mediastinal silhouettes are unchanged. Atherosclerotic calcifications noted within the aortic arch.  Patient has taken a slightly shallow lung inflation. No focal infiltrate, pulmonary edema, or pleural effusion. No pneumothorax.  Multilevel degenerative changes noted within the visualized spine. Compression deformity within the mid thoracic spine is stable as compared to prior studies. No acute osseous abnormality.  IMPRESSION: No active cardiopulmonary disease.   Electronically Signed   By: Rise Mu M.D.   On: 11/09/2013 18:17   Dg Ribs Unilateral Left  11/09/2013   CLINICAL DATA:  Left lower rib pain, status post fall.  EXAM: LEFT RIBS - 2 VIEW  COMPARISON:  Chest radiograph performed earlier today at 6:03 p.m.  FINDINGS: No displaced rib fractures are seen.  Vascular congestion is noted, with mildly increased interstitial markings, likely transient in nature. No definite pleural effusion or pneumothorax is seen. The right lung is incompletely imaged on this study.  The cardiomediastinal silhouette is borderline normal in size. A vascular stent graft is seen overlying the mediastinum. No acute osseous abnormalities are seen.  IMPRESSION: No displaced rib fractures seen. Vascular congestion noted; visualized portions of the lungs remain grossly clear.   Electronically Signed   By: Roanna Raider M.D.   On:  11/09/2013 22:00   Dg Hip Bilateral W/pelvis  11/12/2013   CLINICAL DATA:  Bilateral hip pain after fall.  EXAM: BILATERAL HIP WITH PELVIS - 4+ VIEW  COMPARISON:  Abdominal radiograph 11/11/2013. Hip radiographs 07/21/2012.  FINDINGS: Prior left hip arthroplasty is again identified. There is no evidence of periprosthetic fracture or interval loosening. There is no evidence of dislocation. No pelvic fracture is identified. Mild right hip joint space narrowing is similar to the prior hip radiographs of 2013. No focal lytic or blastic osseous lesion is identified.  IMPRESSION: Unchanged appearance of the hips. No evidence of fracture or dislocation.   Electronically Signed  By: Sebastian Ache   On: 11/12/2013 08:47   Ct Head Wo Contrast  11/11/2013   CLINICAL DATA:  Fall today with left frontal hematoma and abrasion. Altered consciousness.  EXAM: CT HEAD WITHOUT CONTRAST  TECHNIQUE: Contiguous axial images were obtained from the base of the skull through the vertex without intravenous contrast.  COMPARISON:  CT HEAD W/O CM dated 02/09/2013  FINDINGS: Sinuses/Soft tissues: Left frontal scalp soft tissue swelling. Near complete opacification of the right maxillary sinus, new. At least partially secondary to mucosal thickening. Mucosal thickening of the left sphenoid sinus with fluid. This is increased.  No skull fracture. Clear mastoid air cells.  Despite multiple attempts, moderately motion degraded exam.  Intracranial: Moderate low density in the periventricular white matter likely related to small vessel disease. Given the extent of motion degradation, no convincing evidence of acute intracranial abnormality. No mass lesion, hemorrhage, hydrocephalus, acute infarct, intra-axial, or extra-axial fluid collection.  IMPRESSION: 1. Motion degraded exam, despite multiple attempts. 2. Left frontal scalp soft tissue swelling. 3. Given motion, no convincing evidence of acute intracranial abnormality. Consider follow-up CT  when patient is able to hold still. 4. Sinus opacification, favored to be related to progressive sinusitis. If there is a suspicion of facial fracture, consider dedicated CT.   Electronically Signed   By: Jeronimo Greaves M.D.   On: 11/11/2013 23:35   Dg Shoulder Left Port  11/15/2013   CLINICAL DATA:  Pain  EXAM: PORTABLE LEFT SHOULDER - 2+ VIEW  COMPARISON:  None.  FINDINGS: There is no evidence of acute fracture nor dislocation. There is superior subluxation of the humeral head. Postsurgical changes identified along the lateral aspect of the humeral head. Hypertrophic bone spurring is identified within the humeral head as well as along the glenoid. There are areas of subchondral sclerosis within the glenoid.  IMPRESSION: Findings raising concern of supraspinatus tendon injury. Osteoarthritic changes within the humeral head. Postsurgical changes are also identified.   Electronically Signed   By: Salome Holmes M.D.   On: 11/15/2013 16:23   Dg Abd Portable 1v  11/11/2013   CLINICAL DATA:  Abdominal pain and nausea.  EXAM: PORTABLE ABDOMEN - 1 VIEW  COMPARISON:  Lumbar radiographs dated 07/17/2012 and scout image for CT scan of the abdomen dated 09/16/2011  FINDINGS: There is fairly extensive air scattered throughout nondistended loops of large and small bowel. No fecal impaction. No visible free air or free fluid on this supine radiograph. Degenerative changes and scoliosis of the lumbar spine appears stable. Left proximal femoral prosthesis is noted.  IMPRESSION: No acute abnormalities.   Electronically Signed   By: Geanie Cooley M.D.   On: 11/11/2013 17:03    ASSESSMENT:  Chronic left shoulder pain exacerbated by fall        Active Problems:   UTI (lower urinary tract infection)   Sepsis    PLAN: Pt was given a steroid shot for pain, continue pain control, no restrictions to movement of left shoulder  May follow up with Dr. Tobin Chad office after discharge or patient and family state they have an  orthopedist Darrelyn Hillock) and they may follow up with him,  Procedure: Patient was prepped with betadine just under the [facility] joint of the left shoulder posteriorly  Then given 2cc of celestone 30mg /5l and 8cc of marcaine .50%  Patient tolerated the procedure with a 21 gauge needle.   Juana Haralson 11/16/2013. 1:33 PM

## 2013-11-16 NOTE — Progress Notes (Signed)
Occupational Therapy Treatment Patient Details Name: [name] MRN: [id number] DOB: 1935/08/19 Today's Date: 11/16/2013 Time: 0737-1062 OT Time Calculation (min): 21 min  OT Assessment / Plan / Recommendation  History of present illness pt was admitted for uti.  1/27:  she is s/p injection for L shoulder pain   OT comments  Pt would benefit from snf:  Max A for spt and total A for hygiene.  Pt needs cues for safety  Follow Up Recommendations  SNF    Barriers to Discharge       Equipment Recommendations  3 in 1 bedside comode    Recommendations for Other Services    Frequency Min 2X/week   Progress towards OT Goals Progress towards OT goals: Progressing toward goals (slowly)  Plan      Precautions / Restrictions Precautions Precautions: Fall Restrictions Weight Bearing Restrictions: No   Pertinent Vitals/Pain L shoulder painful when attempting to use to push up from chair.  Not rated; cued to position across belly with transfers    ADL  Toilet Transfer: Performed;Maximal assistance Toilet Transfer Method: Stand pivot Toilet Transfer Equipment: Bedside commode Toileting - Clothing Manipulation and Hygiene: Performed;+1 Total assistance Where Assessed - Toileting Clothing Manipulation and Hygiene: Sit to stand from 3-in-1 or toilet Transfers/Ambulation Related to ADLs: spt to bsc and back to chair:  cues not to push with LUE due to pain. S/p injection today ADL Comments: Pt unable to participate in hygiene.  cues for safety during transfer back to chair    OT Diagnosis:    OT Problem List:   OT Treatment Interventions:     OT Goals(current goals can now be found in the care plan section)    Visit Information  Last OT Received On: 11/16/13 Assistance Needed: +1 (spt) History of Present Illness: pt was admitted for uti.  1/27:  she is s/p injection for L shoulder pain    Subjective Data      Prior Functioning       Cognition  Cognition Arousal/Alertness:  Awake/alert Behavior During Therapy: WFL for tasks assessed/performed Overall Cognitive Status: Within Functional Limits for tasks assessed:  Needs reinforcement for safety   Mobility  Transfers Transfers: Stand Pivot Transfers Sit to Stand: Max assist Stand pivot transfers: Max assist General transfer comment: cues not to use LUE and to complete turn prior to sitting when returning to chair    Exercises      Balance    End of Session OT - End of Session Activity Tolerance: Patient tolerated treatment well Patient left: in chair;with call bell/phone within reach;with nursing/sitter in room;with family/visitor present  GO     [name] 11/16/2013, 2:52 PM [name], OTR/L [phone] 11/16/2013

## 2013-11-16 NOTE — Progress Notes (Addendum)
Patient alert and oriented, blood glucose check done ACHS, on insulin sliding scale, daughter at bedside and ask why her mother was getting insulin due to the fact that she is not a diabetic, informed daughter that I would check with the Dr on duty, daughter refuse insulin coverage, patient in stable condition at this time, Dr notified or patient's and family request not to receive insulin

## 2013-11-16 NOTE — Progress Notes (Signed)
[facility] PROGRESS NOTE  [name] BRA:[id number] DOB: [birth year] DOA: 11/09/2013 PCP: Donnajean Lopes, MD  Assessment/Plan: ID:  - Will continue with IV levaquin for now. Started 11/09/13 - Urine culture with >100.000 klebsiella sensitive to levaquin - WBC normalized - Flu neg - Remains hemodynamically stable and tolerating antibiotics  Resp:  - stable on RA - Continue levaquin as tolerated - Pt is continuing to improve clinically.   MSK: - Palpable L sided lateral rib pain.  - no rib fx on rib xray - Prn vicodin for pain.  Abd distension - Acute series reviewed - follow up abd xray without significant findings - Tolerating PO with good bowel sounds on exam  Klebsiella UTI - On levaquin per above  Hypokalemia - Will replace as needed - currently normal  Endo: SSI. A1C very well controlled  Rheum: Continue prednisone.   Neuro/psych: continue regimen.   FEN/GI: heart healthy diet.   Prophylaxis: subq heparin   Fall - Pt reported fall over the evening of 11/11/13 - Head CT w/o acute findings, hip xrays without fracture - Thus far, patient has no focal neurologic findings. Was confused prior to fall, likely related to acute infection. Confusion has continued to improve since tolerating antibiotics and patient is appropriately conversant and oriented.  Shoulder pain: - Appreciate Orthopedic input - S/p steroid injection. Would recommend outpatient follow up on d/c - given recent xray findings, consider shoulder MRI  Hip pain - See above. Imaging neg for fracture - Localized tenderness on palpation over L hip - Consider hip MRI  Code Status:Full Code   Code Status: Full Family Communication: Pt in room, daughter in room. All questions answered. Pt and family agree with the current plan of care. Disposition Plan: Pending SNF  Antibiotics:  Tamiflu 11/09/13>>>11/11/13  Levaquin 11/09/13>>>   HPI/Subjective: No acute events. Pt feels  well.  Objective: Filed Vitals:   11/15/13 0521 11/15/13 0605 11/15/13 1353 11/16/13 0646  BP:  122/75 148/72 134/75  Pulse:  108 109 105  Temp:  97.2 F (36.2 C) 97.4 F (36.3 C) 97.9 F (36.6 C)  TempSrc:  Oral Oral Oral  Resp:  18 18   Height:      Weight: 69.8 kg (153 lb 14.1 oz)     SpO2:  95% 96% 93%    Intake/Output Summary (Last 24 hours) at 11/16/13 1619 Last data filed at 11/16/13 1022  Gross per 24 hour  Intake    120 ml  Output    300 ml  Net   -180 ml   Filed Weights   11/11/13 0521 11/13/13 0629 11/15/13 0521  Weight: 66.3 kg (146 lb 2.6 oz) 67.4 kg (148 lb 9.4 oz) 69.8 kg (153 lb 14.1 oz)    Exam:   General:  Awake, in nad, oriented, conversant  Cardiovascular: regualr, s1, s2  Respiratory: normal resp effort, no wheezing  Abdomen: soft, nondistended  Musculoskeletal: perfused, no  Clubbing, point tenderness over L hip  Neurologic: cn2-12 intact, strength equal bilaterally, sensation intact throughout  Data Reviewed: Basic Metabolic Panel:  Recent Labs Lab 11/12/13 0545 11/13/13 0520 11/14/13 0537 11/15/13 0525 11/16/13 0455  NA 141 140 143 140 143  K 3.6* 3.3* 3.3* 4.0 4.2  CL 102 101 103 99 102  CO2 '25 29 30 ' 33* 31  GLUCOSE 124* 117* 91 96 100*  BUN 7 9 5* 6 8  CREATININE 0.61 0.53 0.48* 0.47* 0.55  CALCIUM 8.1* 7.8* 8.1* 8.8 8.4   Liver Function  Tests:  Recent Labs Lab 11/12/13 0545 11/13/13 0520 11/14/13 0537 11/15/13 0525 11/16/13 0455  AST '15 10 13 15 12  ' ALT '14 10 11 13 9  ' ALKPHOS 58 53 49 52 46  BILITOT 0.5 0.4 0.4 0.3 0.2*  PROT 6.0 5.3* 5.4* 5.8* 5.2*  ALBUMIN 2.6* 2.1* 2.1* 2.3* 2.1*   No results found for this basename: LIPASE, AMYLASE,  in the last 168 hours No results found for this basename: AMMONIA,  in the last 168 hours CBC:  Recent Labs Lab 11/12/13 0545 11/13/13 0520 11/14/13 0537 11/15/13 0525 11/16/13 0455  WBC 8.9 8.8 8.2 9.1 8.2  NEUTROABS 7.1 6.5 5.6 6.1 5.4  HGB 12.5 10.8* 11.3* 11.9*  11.4*  HCT 38.1 33.2* 34.9* 36.3 34.8*  MCV 92.5 92.2 92.3 92.4 93.3  PLT 177 169 177 195 199   Cardiac Enzymes: No results found for this basename: CKTOTAL, CKMB, CKMBINDEX, TROPONINI,  in the last 168 hours BNP (last 3 results) No results found for this basename: PROBNP,  in the last 8760 hours CBG:  Recent Labs Lab 11/15/13 1750 11/15/13 2038 11/15/13 2303 11/16/13 0812 11/16/13 1215  GLUCAP 112* 128* 89 122* 153*    Recent Results (from the past 240 hour(s))  URINE CULTURE     Status: None   Collection Time    11/09/13  5:49 PM      Result Value Range Status   Specimen Description URINE, CLEAN CATCH   Final   Special Requests Normal   Final   Culture  Setup Time     Final   Value: 11/10/2013 05:54     Performed at [facility]     Final   Value: >=100,000 COLONIES/ML     Performed at [facility]   Culture     Final   Value: KLEBSIELLA PNEUMONIAE     Performed at [facility]   Report Status 11/12/2013 FINAL   Final   Organism ID, Bacteria KLEBSIELLA PNEUMONIAE   Final  CULTURE, BLOOD (ROUTINE X 2)     Status: None   Collection Time    11/09/13  6:00 PM      Result Value Range Status   Specimen Description BLOOD RIGHT ARM   Final   Special Requests BOTTLES DRAWN AEROBIC ONLY 4ML   Final   Culture  Setup Time     Final   Value: 11/09/2013 22:08     Performed at [facility]     Final   Value: NO GROWTH 5 DAYS     Performed at [facility]   Report Status 11/15/2013 FINAL   Final  CULTURE, BLOOD (ROUTINE X 2)     Status: None   Collection Time    11/09/13  6:05 PM      Result Value Range Status   Specimen Description BLOOD RIGHT FOREARM   Final   Special Requests BOTTLES DRAWN AEROBIC ONLY 3ML   Final   Culture  Setup Time     Final   Value: 11/09/2013 22:08     Performed at [facility]   Culture     Final   Value: NO GROWTH 5 DAYS     Performed at [facility]   Report  Status 11/15/2013 FINAL   Final  RESPIRATORY VIRUS PANEL     Status: None   Collection Time    11/09/13  9:50 PM  Result Value Range Status   Source - RVPAN NOSE   Final   Respiratory Syncytial Virus A NOT DETECTED   Final   Respiratory Syncytial Virus B NOT DETECTED   Final   Influenza A NOT DETECTED   Final   Influenza B NOT DETECTED   Final   Parainfluenza 1 NOT DETECTED   Final   Parainfluenza 2 NOT DETECTED   Final   Parainfluenza 3 NOT DETECTED   Final   Metapneumovirus NOT DETECTED   Final   Rhinovirus NOT DETECTED   Final   Adenovirus NOT DETECTED   Final   Influenza A H1 NOT DETECTED   Final   Influenza A H3 NOT DETECTED   Final   Comment: (NOTE)           Normal Reference Range for each Analyte: NOT DETECTED     Testing performed using the Lumine[employer] xTAG Respiratory Viral Panel test     kit.     This test was developed and its performance characteristics determined     by [facility]. It has not been cleared or approved by the [place]     Food and Drug Administration. This test is used for clinical purposes.     It should not be regarded as investigational or for research. This     laboratory is certified under the [place] (CLIA) as qualified to perform high complexity     clinical laboratory testing.     Performed at [facility]     Studies: Dg Shoulder Left Port  11/15/2013   CLINICAL DATA:  Pain  EXAM: PORTABLE LEFT SHOULDER - 2+ VIEW  COMPARISON:  None.  FINDINGS: There is no evidence of acute fracture nor dislocation. There is superior subluxation of the humeral head. Postsurgical changes identified along the lateral aspect of the humeral head. Hypertrophic bone spurring is identified within the humeral head as well as along the glenoid. There are areas of subchondral sclerosis within the glenoid.  IMPRESSION: Findings raising concern of supraspinatus tendon injury. Osteoarthritic changes within the humeral  head. Postsurgical changes are also identified.   Electronically Signed   By: Margaree Mackintosh M.D.   On: 11/15/2013 16:23    Scheduled Meds: . amitriptyline  75 mg Oral QHS  . buPROPion  75 mg Oral QAC breakfast  . cholecalciferol  5,000 Units Oral Daily  . heparin  5,000 Units Subcutaneous Q8H  . insulin aspart  0-5 Units Subcutaneous QHS  . insulin aspart  0-9 Units Subcutaneous TID WC  . levofloxacin  750 mg Oral Q24H  . mirtazapine  30 mg Oral QHS  . predniSONE  2.5 mg Oral Q breakfast  . sodium chloride  3 mL Intravenous Q12H   Continuous Infusions: . sodium chloride 100 mL/hr at 11/15/13 1819    Active Problems:   UTI (lower urinary tract infection)   Sepsis  Time spent: 6mn  Latangela Mccomas, [facility] Pager [phone] If 7PM-7AM, please contact night-coverage at [email or web address], password T[facility]1/27/2015, 4:19 PM  LOS: 7 days

## 2013-11-16 NOTE — Progress Notes (Signed)
This shift per tech, pt refused a.m. Bath, requested only bedpan to use.

## 2013-11-17 ENCOUNTER — Inpatient Hospital Stay (HOSPITAL_COMMUNITY): Payer: Medicare Other

## 2013-11-17 DIAGNOSIS — S2239XA Fracture of one rib, unspecified side, initial encounter for closed fracture: Secondary | ICD-10-CM

## 2013-11-17 DIAGNOSIS — S2232XA Fracture of one rib, left side, initial encounter for closed fracture: Secondary | ICD-10-CM | POA: Clinically undetermined

## 2013-11-17 DIAGNOSIS — S329XXA Fracture of unspecified parts of lumbosacral spine and pelvis, initial encounter for closed fracture: Secondary | ICD-10-CM | POA: Clinically undetermined

## 2013-11-17 LAB — CBC WITH DIFFERENTIAL/PLATELET
BASOS ABS: 0.1 10*3/uL (ref 0.0–0.1)
Basophils Relative: 1 % (ref 0–1)
Eosinophils Absolute: 0 10*3/uL (ref 0.0–0.7)
Eosinophils Relative: 0 % (ref 0–5)
HCT: 36.5 % (ref 36.0–46.0)
Hemoglobin: 11.8 g/dL — ABNORMAL LOW (ref 12.0–15.0)
LYMPHS PCT: 8 % — AB (ref 12–46)
Lymphs Abs: 0.8 10*3/uL (ref 0.7–4.0)
MCH: 29.9 pg (ref 26.0–34.0)
MCHC: 32.3 g/dL (ref 30.0–36.0)
MCV: 92.6 fL (ref 78.0–100.0)
Monocytes Absolute: 0.5 10*3/uL (ref 0.1–1.0)
Monocytes Relative: 5 % (ref 3–12)
Neutro Abs: 8.1 10*3/uL — ABNORMAL HIGH (ref 1.7–7.7)
Neutrophils Relative %: 86 % — ABNORMAL HIGH (ref 43–77)
PLATELETS: 230 10*3/uL (ref 150–400)
RBC: 3.94 MIL/uL (ref 3.87–5.11)
RDW: 13.6 % (ref 11.5–15.5)
WBC: 9.5 10*3/uL (ref 4.0–10.5)

## 2013-11-17 LAB — COMPREHENSIVE METABOLIC PANEL
ALK PHOS: 52 U/L (ref 39–117)
ALT: 11 U/L (ref 0–35)
AST: 13 U/L (ref 0–37)
Albumin: 2.5 g/dL — ABNORMAL LOW (ref 3.5–5.2)
BUN: 10 mg/dL (ref 6–23)
CHLORIDE: 100 meq/L (ref 96–112)
CO2: 29 mEq/L (ref 19–32)
Calcium: 9.1 mg/dL (ref 8.4–10.5)
Creatinine, Ser: 0.44 mg/dL — ABNORMAL LOW (ref 0.50–1.10)
GFR calc non Af Amer: >90 mL/min (ref >90)
GLUCOSE: 139 mg/dL — AB (ref 70–99)
POTASSIUM: 4.1 meq/L (ref 3.7–5.3)
Sodium: 141 mEq/L (ref 137–147)
Total Bilirubin: 0.2 mg/dL — ABNORMAL LOW (ref 0.3–1.2)
Total Protein: 5.9 g/dL — ABNORMAL LOW (ref 6.0–8.3)

## 2013-11-17 LAB — GLUCOSE, CAPILLARY
GLUCOSE-CAPILLARY: 132 mg/dL — AB (ref 70–99)
GLUCOSE-CAPILLARY: 139 mg/dL — AB (ref 70–99)

## 2013-11-17 MED ORDER — HYDROCODONE-ACETAMINOPHEN 5-325 MG PO TABS: 1.0000 | ORAL_TABLET | ORAL | Status: DC | PRN

## 2013-11-17 MED ORDER — ALPRAZOLAM 1 MG PO TABS
1.0000 mg | ORAL_TABLET | Freq: Three times a day (TID) | ORAL | Status: DC | PRN
Start: 2013-11-17 — End: 2015-03-22

## 2013-11-17 NOTE — Progress Notes (Signed)
Pt asked to speak with charge nurse,per Phineas Semen. I went to room and pt had concerns about hearing and seeing Hermania, the Nurse tech outside of her door. Expressed concerns about being fearful and scared to sleep because the tech was here. daughter is at the bedside. I informed pt that this nurse tech in question is on the floor tonight working and providing pt care for others. Reminded pt that Rosey Bath is her NT this shift and has provided her care throughout evening  and Phineas Semen and I are her nurses. This information seemed to allay her concerns somewhat. Will continue to monitor and intervene as appropriate.

## 2013-11-17 NOTE — Progress Notes (Signed)
Physical Therapy Treatment Patient Details Name: [name] MRN: [id number] DOB: [birth year] Today's Date: 11/17/2013 Time: 0911-0929 PT Time Calculation (min): 18 min  PT Assessment / Plan / Recommendation  History of Present Illness pt was admitted for uti. Sustained fall in hospital.  1/27:she is s/p injection for L shoulder pain. MRI revealed bil pelvic fractures.    PT Comments   MRI revealed bil pelvic fractures, R hip joint effusion, L hip muscular contusion. Able to initiate ambulation this session however pt remains limited by pain. Daughter present and confirmed plan is for SNF.   Follow Up Recommendations  SNF     Does the patient have the potential to tolerate intense rehabilitation     Barriers to Discharge        Equipment Recommendations  None recommended by PT    Recommendations for Other Services    Frequency Min 3X/week   Progress towards PT Goals Progress towards PT goals: Progressing toward goals (very slowly-limited by pain)  Plan Current plan remains appropriate    Precautions / Restrictions Precautions Precautions: Fall Precaution Comments: Pt did fall in hospital night of 1/22 Restrictions Weight Bearing Restrictions: No   Pertinent Vitals/Pain Minimal pain at rest; Increased pain with activity-pt unable to rate    Mobility  Transfers Overall transfer level: Needs assistance Transfers: Sit to/from Stand Sit to Stand: Max assist General transfer comment: Assist to rise, stabilize, control descent. Multimodal cues for safety, technique, hand placement Ambulation/Gait Ambulation/Gait assistance: +2 physical assistance;+2 safety/equipment;Mod assist Ambulation Distance (Feet): 10 Feet Assistive device: Rolling walker (2 wheeled) General Gait Details: very slow gait speed. gait signficantly antalgic (per pt L LE worse than R). Followed closely with recliner. VCS safety, technique, sequence    Exercises     PT Diagnosis:    PT Problem List:    PT Treatment Interventions:     PT Goals (current goals can now be found in the care plan section)    Visit Information  Last PT Received On: 11/17/13 Assistance Needed: +2 (safety) History of Present Illness: pt was admitted for uti.  1/27:  she is s/p injection for L shoulder pain    Subjective Data      Cognition  Cognition Arousal/Alertness: Awake/alert Behavior During Therapy: WFL for tasks assessed/performed Area of Impairment: Memory    Balance     End of Session PT - End of Session Equipment Utilized During Treatment: Gait belt Activity Tolerance: Patient limited by pain Patient left: in chair;with call bell/phone within reach;with family/visitor present;with chair alarm set   GP     Rebeca Alert, MPT Pager: [phone]

## 2013-11-17 NOTE — Discharge Summary (Addendum)
Physician Discharge Summary  [name] XEN:[id number] DOB: 1935-02-12 DOA: 11/09/2013  PCP: Donnajean Lopes, MD  Admit date: 11/09/2013 Discharge date: 11/17/2013  Time spent: 40 minutes  Recommendations for Outpatient Follow-up:  1. SNF with outpt follow up with ortho in 4 weeks( Dr Alvan Dame) 2. Please provide bedside spirometry  Discharge Diagnoses:  Principal Problem:   UTI (lower urinary tract infection)  Active Problems:   Weakness generalized   Rheumatoid arthritis(714.0)   Type II or unspecified type diabetes mellitus without mention of complication, not stated as uncontrolled   Gait instability   Sepsis   Left rib fracture   Closed pelvic fracture   Pneumonia   Discharge Condition: fair  Diet recommendation: daibetic  Filed Weights   11/11/13 0521 11/13/13 0629 11/15/13 0521  Weight: 66.3 kg (146 lb 2.6 oz) 67.4 kg (148 lb 9.4 oz) 69.8 kg (153 lb 14.1 oz)    History of present illness:  Please refer admission H&P for details, but in brief, 79 y.o. year old female with multiple medical problems including prior hx/o recurrent UTI with secondary sepsis syndrome admitted in 08/2013, RA on chronic prednisone, aortic stenosis s/p aortic valve replacement, type II diabetes presenting with cough, fever, UTI w/ ? Sepsis syndrome. Pt has had progressive cough over the last 1-2 days. Had tmax of 102 on admission. Pt denies any dysuria, increased urinary frequency. No wheezing. Mild rhinorrhea and nasal congestion. + flu shot. No myalgias. Pt does report having some L sided rib pain s/p mechanical fall last week. Per daughter, sxs became progressively worse throughout the day and pt was subsequently brought to the ER.  In the ER a code sepsis was initially called as pt presented with TMax 102 with WBC @ 13 and SBPs in 100s. BP improved to 130s with IV fluids. UA was indicative of UTI. CXR negative for infiltrate though pt with signficant cough. No noted rib fracture in setting of  pt with recent fall. Pt started on levaquin for UTI and resp coverage. Critical care also consulted.    Hospital Course:  UTI/ HCAP - IV levaquin was started empirically on 11/09/13 with L side opacity on CXR suggestive of pneumonia  - Urine culture later demonstrated >100.000 klebsiella sensitive to levaquin - WBC normalized  - Flu was neg  - Remained hemodynamically stable and tolerating antibiotics  - Plan to complete course of levaquin through 11/19/13 (10 day course)  -Cough resolved   Abd distension  - Acute series reviewed  - follow up abd xray without significant findings  - Patient has been tolerating PO    Hypokalemia  - Replaced   Diabetes mellitus  A1C of 6.7 patient on low dose prednisone. fsg stable here. Have not started on any medications. Please monitor as outpt  Rheumatoid arthritis  Continue prednisone.     Fall in the hospital on 1/22 - Pt reported fall over the evening of 11/11/13  - Head CT w/o acute findings, hip xrays  Done without fracture  - patient has no focal neurologic findings. Was confused prior to fall, likely related to acute infection. Confusion has continued to improve since tolerating antibiotics and patient remains appropriately conversant and oriented.  -she complained of left shoulder pain ad hip pain again. An xray of left shoulder suggested some tendinitis. - Appreciate Orthopedic input  - S/p steroid injection on 1/27.  Given pain over the shoulder and left hip , an MRI of the left shoulder was done which showed fracture of left  upper ribs, which can explain referred pain to her left shoulder. Also MRI fo the hip done which showed non displaced  bilateral pelvic fractures. These findings were not seen on xray of left shoulder and xray of hips on 1/23 taken  after the fall. As per admission note and per daughter patient had a fall at home ( slid from the chair to the floor about 1 week back and c/o pain over left ribs on presentation).  Since the nature and extent of fall and injury sustained in the hospital is unclear and un witnessed, it is unclear if patient had sustained these injury prior to hospitalization when she had the fall at home vs during the hospital stay.. -Patient able to tolerate weight bearing with support and take few steps.  -orthopedics consulted and recommends conservative management with pain control, PT and weight bearing as tolerated. Follow up with Dr Alvan Dame in 4 weeks. -repeat head CT given concern for AMS done today and negative for intracranial abnormality. Has left  mastoid opacification but patient is asymptomatic. -she will benefit from bedside spirometry to avoid lung atelectasis given left rib fractures.  Patient is clinically stable and cane be discharged home with outpt follow up  Consultations:  Orthopedic Surgery     Procedures: none  Discharge Exam: Filed Vitals:   11/17/13 1408  BP: 120/68  Pulse: 104  Temp: 97.6 F (36.4 C)  Resp: 18    General: elderly female in NAD HEENT: superficial laceration over left forehead, no pallor, moist mucosa Cardiovascular: NS1&S2, no murmurs, rubs or gallop Respiratory: clear b/l, no added sounds', tender to palpation over left upper lateral ribs Abd: soft, NT, ND BS+ Ext: warm, no edema, tender to palpation over the hips ( L>R) CNS: AAOX3  Discharge Instructions     Medication List         ALPRAZolam 1 MG tablet  Commonly known as:  XANAX  Take 1 mg by mouth 3 (three) times daily as needed for anxiety.     amitriptyline 25 MG tablet  Commonly known as:  ELAVIL  Take 75 mg by mouth at bedtime.     buPROPion 75 MG tablet  Commonly known as:  WELLBUTRIN  Take 75 mg by mouth every morning.     furosemide 20 MG tablet  Commonly known as:  LASIX  Take 20 mg by mouth daily.     HYDROcodone-acetaminophen 5-325 MG per tablet  Commonly known as:  NORCO/VICODIN  Take 1 tablet by mouth every 4 (four) hours as needed for moderate  pain.     levofloxacin 750 MG tablet  Commonly known as:  LEVAQUIN  Take 1 tablet (750 mg total) by mouth daily. UNTIL 11/19/2013     mirtazapine 30 MG tablet  Commonly known as:  REMERON  Take 30 mg by mouth at bedtime.     predniSONE 5 MG tablet  Commonly known as:  DELTASONE  Take 2.5 mg by mouth daily with breakfast.     SYSTANE OP  Place 1 drop into both eyes 2 (two) times daily as needed (dry eyes).     Vitamin D3 5000 UNITS Caps  Take 5,000 Units by mouth daily.     VITAMIN K2 PO  Take 1 tablet by mouth daily.       Allergies  Allergen Reactions  . Penicillins Hives       Follow-up Information   Follow up with Donnajean Lopes, MD. Schedule an appointment as soon as possible for a  visit in 1 week.   Specialty:  Internal Medicine   Contact information:   [place] [address] [phone]       Follow up with Mauri Pole, MD. Schedule an appointment as soon as possible for a visit in 4 weeks.   Specialty:  Orthopedic Surgery   Contact information:   [address] [place] [address] [phone]       Follow up with Donnajean Lopes, MD In 1 week. (AFTER D/C FROM [facility])    Specialty:  Internal Medicine   Contact information:   [place]  [address] [phone]        The results of significant diagnostics from this hospitalization (including imaging, microbiology, ancillary and laboratory) are listed below for reference.    Significant Diagnostic Studies: X-ray Chest Pa And Lateral   11/10/2013   CLINICAL DATA:  Cough, congestion and weakness.  EXAM: CHEST  2 VIEW  COMPARISON:  Chest radiograph performed 11/09/2013  FINDINGS: The lungs are well-aerated. Mild left basilar opacity may reflect atelectasis or possibly mild pneumonia. There is no evidence of pleural effusion or pneumothorax.  The heart is borderline normal in size. A stent graft is seen overlying the mediastinum. No acute osseous  abnormalities are seen. Clips are noted within the right upper quadrant, reflecting prior cholecystectomy.  IMPRESSION: Mild left basilar airspace opacity may reflect atelectasis or possibly mild pneumonia.   Electronically Signed   By: Garald Balding M.D.   On: 11/10/2013 05:31   Dg Chest 2 View  11/09/2013   CLINICAL DATA:  Shortness of breath, cough, weakness  EXAM: CHEST  2 VIEW  COMPARISON:  Prior radiograph from 09/15/2013  FINDINGS: Vascular stent again projects over the heart. Cardiac and mediastinal silhouettes are unchanged. Atherosclerotic calcifications noted within the aortic arch.  Patient has taken a slightly shallow lung inflation. No focal infiltrate, pulmonary edema, or pleural effusion. No pneumothorax.  Multilevel degenerative changes noted within the visualized spine. Compression deformity within the mid thoracic spine is stable as compared to prior studies. No acute osseous abnormality.  IMPRESSION: No active cardiopulmonary disease.   Electronically Signed   By: Jeannine Boga M.D.   On: 11/09/2013 18:17   Dg Ribs Unilateral Left  11/09/2013   CLINICAL DATA:  Left lower rib pain, status post fall.  EXAM: LEFT RIBS - 2 VIEW  COMPARISON:  Chest radiograph performed earlier today at 6:03 p.m.  FINDINGS: No displaced rib fractures are seen.  Vascular congestion is noted, with mildly increased interstitial markings, likely transient in nature. No definite pleural effusion or pneumothorax is seen. The right lung is incompletely imaged on this study.  The cardiomediastinal silhouette is borderline normal in size. A vascular stent graft is seen overlying the mediastinum. No acute osseous abnormalities are seen.  IMPRESSION: No displaced rib fractures seen. Vascular congestion noted; visualized portions of the lungs remain grossly clear.   Electronically Signed   By: Garald Balding M.D.   On: 11/09/2013 22:00   Dg Hip Bilateral W/pelvis  11/12/2013   CLINICAL DATA:  Bilateral hip pain  after fall.  EXAM: BILATERAL HIP WITH PELVIS - 4+ VIEW  COMPARISON:  Abdominal radiograph 11/11/2013. Hip radiographs 07/21/2012.  FINDINGS: Prior left hip arthroplasty is again identified. There is no evidence of periprosthetic fracture or interval loosening. There is no evidence of dislocation. No pelvic fracture is identified. Mild right hip joint space narrowing is similar to the prior hip radiographs of 2013. No focal  lytic or blastic osseous lesion is identified.  IMPRESSION: Unchanged appearance of the hips. No evidence of fracture or dislocation.   Electronically Signed   By: Logan Bores   On: 11/12/2013 08:47   Ct Head Wo Contrast  11/17/2013   CLINICAL DATA:  Fall with head injury.  Rule out hemorrhage.  EXAM: CT HEAD WITHOUT CONTRAST  TECHNIQUE: Contiguous axial images were obtained from the base of the skull through the vertex without intravenous contrast.  COMPARISON:  11/11/2013  FINDINGS: There is mild motion artifact. There is no evidence of acute cortical infarct, mass, midline shift, intracranial hemorrhage, or extra-axial fluid collection. There is mild to moderate cerebral atrophy. Patchy periventricular white matter hypodensities are unchanged and compatible with mild chronic small vessel ischemic disease.  Prior bilateral cataract surgery is noted. There is trace left mastoid fluid. There remains near complete opacification of the right maxillary sinus, incompletely imaged. Mucosal thickening is also present laterally in the right sphenoid sinus, with minimal left sphenoid sinus mucosal thickening noted as well.  IMPRESSION: No evidence of acute intracranial abnormality.   Electronically Signed   By: Logan Bores   On: 11/17/2013 14:37   Ct Head Wo Contrast  11/11/2013   CLINICAL DATA:  Fall today with left frontal hematoma and abrasion. Altered consciousness.  EXAM: CT HEAD WITHOUT CONTRAST  TECHNIQUE: Contiguous axial images were obtained from the base of the skull through the vertex  without intravenous contrast.  COMPARISON:  CT HEAD W/O CM dated 02/09/2013  FINDINGS: Sinuses/Soft tissues: Left frontal scalp soft tissue swelling. Near complete opacification of the right maxillary sinus, new. At least partially secondary to mucosal thickening. Mucosal thickening of the left sphenoid sinus with fluid. This is increased.  No skull fracture. Clear mastoid air cells.  Despite multiple attempts, moderately motion degraded exam.  Intracranial: Moderate low density in the periventricular white matter likely related to small vessel disease. Given the extent of motion degradation, no convincing evidence of acute intracranial abnormality. No mass lesion, hemorrhage, hydrocephalus, acute infarct, intra-axial, or extra-axial fluid collection.  IMPRESSION: 1. Motion degraded exam, despite multiple attempts. 2. Left frontal scalp soft tissue swelling. 3. Given motion, no convincing evidence of acute intracranial abnormality. Consider follow-up CT when patient is able to hold still. 4. Sinus opacification, favored to be related to progressive sinusitis. If there is a suspicion of facial fracture, consider dedicated CT.   Electronically Signed   By: Abigail Miyamoto M.D.   On: 11/11/2013 23:35   Mr Shoulder Left Wo Contrast  11/17/2013   CLINICAL DATA:  Left shoulder pain after falling last week. History of shoulder surgery.  EXAM: MRI OF THE LEFT SHOULDER WITHOUT CONTRAST  TECHNIQUE: Multiplanar, multisequence MR imaging of the shoulder was performed. No intravenous contrast was administered.  COMPARISON:  Radiographs 11/15/2013.  FINDINGS: Examination is mildly motion degraded.  Rotator cuff: There is postsurgical susceptibility artifact in the greater tuberosity consistent with prior rotator cuff repair. There is diffuse tendinosis involving the subscapularis, supraspinatus and infraspinous tendons. No recurrent full-thickness rotator cuff tear is demonstrated.  Muscles: There is mild generalized rotator cuff  muscular atrophy. There is asymmetric edema in the infraspinous at the musculotendinous junction which may reflect a muscular contusion.  Biceps long head: Not visualized consistent with complete rupture or prior tenotomy.  Acromioclavicular Joint: There is evidence of prior distal clavicle resection and probable acromioplasty. The subacromial space of the shoulder is mildly narrowed. There is minimal fluid in the subacromial -subdeltoid space.  Glenohumeral Joint: The humeral head is high-riding. There are mild glenohumeral degenerative changes. No significant joint effusion is demonstrated.  Labrum: There is degenerative tearing of the superior labrum with adjacent small paralabral cysts, best seen on the coronal images.  Bones: There is a bone island within the scapular glenoid. There is a mildly displaced fracture of one of the upper left ribs laterally, probably the fourth rib. There is surrounding soft tissue edema, and this is likely an acute fracture.  IMPRESSION: 1. Mildly displaced fracture of one of the upper left ribs with surrounding soft tissue edema consistent with an acute injury. This may contribute to the patient's left shoulder pain. 2. No evidence of left shoulder fracture or dislocation. 3. Postsurgical changes related to prior rotator cuff repair and probable biceps tenotomy. There is diffuse rotator cuff tendinosis with edema in the infraspinous muscle, but no recurrent full-thickness tear. 4. Degenerative tear of the superior labrum related to high riding humeral head.   Electronically Signed   By: Camie Patience M.D.   On: 11/17/2013 08:47   Mr Hip Right Wo Contrast  11/17/2013   CLINICAL DATA:  Bilateral hip pain status post fall. Left total hip replacement. Limited ambulation.  EXAM: MRI OF THE RIGHT HIP WITHOUT CONTRAST  TECHNIQUE: Multiplanar, multisequence MR imaging was performed. No intravenous contrast was administered.  COMPARISON:  Pelvic and left hip radiographs 11/12/2013.  Pelvic CT 09/16/2011.  FINDINGS: Study is performed in conjunction with MRI of the left hip. Left hip findings are dictated separately.  The right femoral head appears normal without evidence of acute fracture, dislocation or avascular necrosis. There is a moderate size right hip joint effusion. There are nondisplaced fractures of the right superior pubic ramus and both inferior pubic rami. There are nondisplaced fractures of the left hemi sacrum and adjacent iliac bone. There is patchy edema within the right iliac bone without discrete fracture. There is no diastasis of the sacroiliac joints or symphysis pubis.  There is 7.0 cm oval fluid collection within the subcutaneous fat of the right groin. Subcutaneous edema is present lateral to both hips. There is a small amount of asymmetric peritrochanteric fluid on the left. There is perirectal edema and edema associated with the adductor musculature of both thighs, attributed the pelvic fractures.  There is generalized pelvic muscular atrophy, slightly greater within the right gluteus musculature. The piriformis muscles are atrophied bilaterally.  IMPRESSION: 1. No evidence of right femoral fracture or avascular necrosis. 2. Nondisplaced bilateral pelvic fractures as described with adjacent muscular edema. No sacroiliac joint or symphysis pubis diastasis. 3. Moderate size right hip joint effusion. 4. Nonspecific fluid collection in the subcutaneous fat of the right anterior groin.   Electronically Signed   By: Camie Patience M.D.   On: 11/17/2013 08:20   Mr Hip Left Wo Contrast  11/17/2013   CLINICAL DATA:  Bilateral hip pain status post fall. Left total hip replacement. Limited ambulation.  EXAM: MRI OF THE LEFT HIP WITHOUT CONTRAST  TECHNIQUE: Multiplanar, multisequence MR imaging was performed. No intravenous contrast was administered.  COMPARISON:  Radiographs 11/12/2013.  FINDINGS: The study was performed in conjunction with MRI of the right hip. Right hip and  pelvic findings are dictated separately.  Patient is status post left total hip arthroplasty. There is no evidence of proximal femur fracture or dislocation. Bilateral pelvic fractures are described on the separate examination of the right hip.  There is edema throughout the left gluteus and adductor musculature attributed to the  fractures. There is asymmetric subcutaneous fluid lateral to the left femoral greater trochanter.  IMPRESSION: 1. No evidence of left hip fracture or dislocation status post total hip arthroplasty. 2. Bilateral pelvic fractures, further described on separate report of the right hip. 3. Muscular contusions and asymmetric fluid lateral to the left femoral greater trochanter.   Electronically Signed   By: Camie Patience M.D.   On: 11/17/2013 08:19   Dg Shoulder Left Port  11/15/2013   CLINICAL DATA:  Pain  EXAM: PORTABLE LEFT SHOULDER - 2+ VIEW  COMPARISON:  None.  FINDINGS: There is no evidence of acute fracture nor dislocation. There is superior subluxation of the humeral head. Postsurgical changes identified along the lateral aspect of the humeral head. Hypertrophic bone spurring is identified within the humeral head as well as along the glenoid. There are areas of subchondral sclerosis within the glenoid.  IMPRESSION: Findings raising concern of supraspinatus tendon injury. Osteoarthritic changes within the humeral head. Postsurgical changes are also identified.   Electronically Signed   By: Margaree Mackintosh M.D.   On: 11/15/2013 16:23   Dg Abd Portable 1v  11/11/2013   CLINICAL DATA:  Abdominal pain and nausea.  EXAM: PORTABLE ABDOMEN - 1 VIEW  COMPARISON:  Lumbar radiographs dated 07/17/2012 and scout image for CT scan of the abdomen dated 09/16/2011  FINDINGS: There is fairly extensive air scattered throughout nondistended loops of large and small bowel. No fecal impaction. No visible free air or free fluid on this supine radiograph. Degenerative changes and scoliosis of the lumbar  spine appears stable. Left proximal femoral prosthesis is noted.  IMPRESSION: No acute abnormalities.   Electronically Signed   By: Rozetta Nunnery M.D.   On: 11/11/2013 17:03    Microbiology: Recent Results (from the past 240 hour(s))  URINE CULTURE     Status: None   Collection Time    11/09/13  5:49 PM      Result Value Range Status   Specimen Description URINE, CLEAN CATCH   Final   Special Requests Normal   Final   Culture  Setup Time     Final   Value: 11/10/2013 05:54     Performed at [facility]     Final   Value: >=100,000 COLONIES/ML     Performed at [facility]   Culture     Final   Value: KLEBSIELLA PNEUMONIAE     Performed at [facility]   Report Status 11/12/2013 FINAL   Final   Organism ID, Bacteria KLEBSIELLA PNEUMONIAE   Final  CULTURE, BLOOD (ROUTINE X 2)     Status: None   Collection Time    11/09/13  6:00 PM      Result Value Range Status   Specimen Description BLOOD RIGHT ARM   Final   Special Requests BOTTLES DRAWN AEROBIC ONLY 4ML   Final   Culture  Setup Time     Final   Value: 11/09/2013 22:08     Performed at [facility]   Culture     Final   Value: NO GROWTH 5 DAYS     Performed at [facility]   Report Status 11/15/2013 FINAL   Final  CULTURE, BLOOD (ROUTINE X 2)     Status: None   Collection Time    11/09/13  6:05 PM      Result Value Range Status   Specimen Description BLOOD RIGHT FOREARM   Final  Special Requests BOTTLES DRAWN AEROBIC ONLY 3ML   Final   Culture  Setup Time     Final   Value: 11/09/2013 22:08     Performed at [facility]   Culture     Final   Value: NO GROWTH 5 DAYS     Performed at [facility]   Report Status 11/15/2013 FINAL   Final  RESPIRATORY VIRUS PANEL     Status: None   Collection Time    11/09/13  9:50 PM      Result Value Range Status   Source - RVPAN NOSE   Final   Respiratory Syncytial Virus A NOT DETECTED   Final   Respiratory  Syncytial Virus B NOT DETECTED   Final   Influenza A NOT DETECTED   Final   Influenza B NOT DETECTED   Final   Parainfluenza 1 NOT DETECTED   Final   Parainfluenza 2 NOT DETECTED   Final   Parainfluenza 3 NOT DETECTED   Final   Metapneumovirus NOT DETECTED   Final   Rhinovirus NOT DETECTED   Final   Adenovirus NOT DETECTED   Final   Influenza A H1 NOT DETECTED   Final   Influenza A H3 NOT DETECTED   Final   Comment: (NOTE)           Normal Reference Range for each Analyte: NOT DETECTED     Testing performed using the Luminex xTAG Respiratory Viral Panel test     kit.     This test was developed and its performance characteristics determined     by [facility]. It has not been cleared or approved by the [place]     Food and Drug Administration. This test is used for clinical purposes.     It should not be regarded as investigational or for research. This     laboratory is certified under the [place] (CLIA) as qualified to perform high complexity     clinical laboratory testing.     Performed at [facility]: Basic Metabolic Panel:  Recent Labs Lab 11/13/13 0520 11/14/13 0537 11/15/13 0525 11/16/13 0455 11/17/13 0520  NA 140 143 140 143 141  K 3.3* 3.3* 4.0 4.2 4.1  CL 101 103 99 102 100  CO2 29 30 33* 31 29  GLUCOSE 117* 91 96 100* 139*  BUN 9 5* '6 8 10  ' CREATININE 0.53 0.48* 0.47* 0.55 0.44*  CALCIUM 7.8* 8.1* 8.8 8.4 9.1   Liver Function Tests:  Recent Labs Lab 11/13/13 0520 11/14/13 0537 11/15/13 0525 11/16/13 0455 11/17/13 0520  AST '10 13 15 12 13  ' ALT '10 11 13 9 11  ' ALKPHOS 53 49 52 46 52  BILITOT 0.4 0.4 0.3 0.2* 0.2*  PROT 5.3* 5.4* 5.8* 5.2* 5.9*  ALBUMIN 2.1* 2.1* 2.3* 2.1* 2.5*   No results found for this basename: LIPASE, AMYLASE,  in the last 168 hours No results found for this basename: AMMONIA,  in the last 168 hours CBC:  Recent Labs Lab 11/13/13 0520 11/14/13 0537  11/15/13 0525 11/16/13 0455 11/17/13 0520  WBC 8.8 8.2 9.1 8.2 9.5  NEUTROABS 6.5 5.6 6.1 5.4 8.1*  HGB 10.8* 11.3* 11.9* 11.4* 11.8*  HCT 33.2* 34.9* 36.3 34.8* 36.5  MCV 92.2 92.3 92.4 93.3 92.6  PLT 169 177 195 199 230   Cardiac Enzymes: No results found for this basename: CKTOTAL,  CKMB, CKMBINDEX, TROPONINI,  in the last 168 hours BNP: BNP (last 3 results) No results found for this basename: PROBNP,  in the last 8760 hours CBG:  Recent Labs Lab 11/16/13 1215 11/16/13 1655 11/16/13 2127 11/17/13 0752 11/17/13 1204  GLUCAP 153* 185* 144* 132* 139*       Signed:  Adiyah Lame  [facility] 11/17/2013, 3:10 PM

## 2013-11-17 NOTE — Progress Notes (Signed)
Clinical Social Work  CSW faxed DC summary to [name] who is agreeable to accept today. CSW informed patient and dtr of DC plans and both parties agreeable to PTAR to transport due to patient's oxygen. Patient and family aware of no guarantee of payment. CSW prepared DC packet with FL2, hard scripts, and PTAR forms included. CSW coordinated transportation via [place].   CSW is signing off but available if needed.  Unk Lightning, LCSW (Coverage for [employer])

## 2013-11-17 NOTE — Progress Notes (Signed)
Pt transported to CT via wheelchair for CT head. Pt had extreme difficulty ambulating from chair to CT table---3 techs assisted pt onto CT table. Pt returned to her room via stretcher--transferred from stretcher to bed with help from nurse tech utilizing draw sheet. Notified Dennis Little RN of pt's limited mobility in CT

## 2013-11-17 NOTE — Consult Note (Signed)
Reason for Consult: Groin/hip pain - pelvic fracture Referring Physician:  [name] is an 79 y.o. female.  HPI:  79 yo female admitted on the 20th for medical concerns.  Due to complaints radiographic work up ensued revealing rami fractures, Ortho consulted for recs and follow up   Past Medical History  Diagnosis Date  . Osteoporosis   . Rheumatoid arthritis(714.0)   . Murmur, cardiac   . Pneumonia   . Anxiety   . Adrenal insufficiency, primary, iatrogenic   . Gait instability   . Physical deconditioning   . SOBOE (shortness of breath on exertion)     echo 02/23/13-EF 55-60%, possible bicuspid aortic valve with mod to severed stenosis   . Palpitations   . Chest pain     myoview 04/13/04-inferoapical thinning with a suggestion of mild apical ischemia, EF 66%  . Syncope   . Aortic stenosis, severe 02/26/2013  . Pancreatic insufficiency 11/09/2012  . UTI (lower urinary tract infection) 10/01/2011  . Hearing loss 03/25/2013  . Memory deficit 03/25/2013  . Depression 03/25/2013  . Pulmonary fibrosis 03/25/2013  . Coronary artery disease     Past Surgical History  Procedure Laterality Date  . Knee arthroscopy      2 on left  . Cholecystectomy    . Thyroidectomy    . Abdominal hysterectomy    . Cardiac catheterization  11/08/96    no evidence of CAD, nl LV function  . Shoulder surgery Left     Family History  Problem Relation Age of Onset  . Stroke Brother   . Cancer - Colon Father   . Cancer Sister     spine?    Social History:  reports that she has never smoked. She has never used smokeless tobacco. She reports that she does not drink alcohol or use illicit drugs.  Allergies:  Allergies  Allergen Reactions  . Penicillins Hives    Medications:  I have reviewed the patient's current medications. Scheduled: . amitriptyline  75 mg Oral QHS  . buPROPion  75 mg Oral QAC breakfast  . cholecalciferol  5,000 Units Oral Daily  . heparin  5,000 Units  Subcutaneous Q8H  . insulin aspart  0-5 Units Subcutaneous QHS  . insulin aspart  0-9 Units Subcutaneous TID WC  . levofloxacin  750 mg Oral Q24H  . mirtazapine  30 mg Oral QHS  . predniSONE  2.5 mg Oral Q breakfast  . sodium chloride  3 mL Intravenous Q12H    Results for orders placed during the hospital encounter of 11/09/13 (from the past 24 hour(s))  GLUCOSE, CAPILLARY     Status: Abnormal   Collection Time    11/16/13  4:55 PM      Result Value Range   Glucose-Capillary 185 (*) 70 - 99 mg/dL   Comment 1 Notify RN    GLUCOSE, CAPILLARY     Status: Abnormal   Collection Time    11/16/13  9:27 PM      Result Value Range   Glucose-Capillary 144 (*) 70 - 99 mg/dL  COMPREHENSIVE METABOLIC PANEL     Status: Abnormal   Collection Time    11/17/13  5:20 AM      Result Value Range   Sodium 141  137 - 147 mEq/L   Potassium 4.1  3.7 - 5.3 mEq/L   Chloride 100  96 - 112 mEq/L   CO2 29  19 - 32 mEq/L   Glucose, Bld 139 (*)  70 - 99 mg/dL   BUN 10  6 - 23 mg/dL   Creatinine, Ser 2.59 (*) 0.50 - 1.10 mg/dL   Calcium 9.1  8.4 - 56.3 mg/dL   Total Protein 5.9 (*) 6.0 - 8.3 g/dL   Albumin 2.5 (*) 3.5 - 5.2 g/dL   AST 13  0 - 37 U/L   ALT 11  0 - 35 U/L   Alkaline Phosphatase 52  39 - 117 U/L   Total Bilirubin 0.2 (*) 0.3 - 1.2 mg/dL   GFR calc non Af Amer >90  >90 mL/min   GFR calc Af Amer >90  >90 mL/min  CBC WITH DIFFERENTIAL     Status: Abnormal   Collection Time    11/17/13  5:20 AM      Result Value Range   WBC 9.5  4.0 - 10.5 K/uL   RBC 3.94  3.87 - 5.11 MIL/uL   Hemoglobin 11.8 (*) 12.0 - 15.0 g/dL   HCT 87.5  64.3 - 32.9 %   MCV 92.6  78.0 - 100.0 fL   MCH 29.9  26.0 - 34.0 pg   MCHC 32.3  30.0 - 36.0 g/dL   RDW 51.8  84.1 - 66.0 %   Platelets 230  150 - 400 K/uL   Neutrophils Relative % 86 (*) 43 - 77 %   Neutro Abs 8.1 (*) 1.7 - 7.7 K/uL   Lymphocytes Relative 8 (*) 12 - 46 %   Lymphs Abs 0.8  0.7 - 4.0 K/uL   Monocytes Relative 5  3 - 12 %   Monocytes Absolute  0.5  0.1 - 1.0 K/uL   Eosinophils Relative 0  0 - 5 %   Eosinophils Absolute 0.0  0.0 - 0.7 K/uL   Basophils Relative 1  0 - 1 %   Basophils Absolute 0.1  0.0 - 0.1 K/uL  GLUCOSE, CAPILLARY     Status: Abnormal   Collection Time    11/17/13  7:52 AM      Result Value Range   Glucose-Capillary 132 (*) 70 - 99 mg/dL   Comment 1 Notify RN    GLUCOSE, CAPILLARY     Status: Abnormal   Collection Time    11/17/13 12:04 PM      Result Value Range   Glucose-Capillary 139 (*) 70 - 99 mg/dL   Comment 1 Notify RN      X-ray: Review of plain films and MRIs of both hips reveal non-displaced rami fractures  ROS As per hospital admission, No recent falls or other Orthopaedic injuries  Blood pressure 120/68, pulse 104, temperature 97.6 F (36.4 C), temperature source Oral, resp. rate 18, height 5\' 3"  (1.6 m), weight 69.8 kg (153 lb 14.1 oz), SpO2 97.00%.  Physical Exam Awake alert cooperative Family in room  Sitting in chair Complaints of groin discomfort with weight bearing as well as left chest wall pain  Assessment/Plan: Non-displaced rami fractures  Non surgical WBAT BLE  [facility], [name] in 4 weks  [name] D 11/17/2013, 2:25 PM

## 2013-11-17 NOTE — Progress Notes (Signed)
Patient discharge to SNF ([name]), alert and oriented, transferred by Medical Transportation, no family at bedside, IV discontinued and pulled, patient able to fellow commands at time of discharge and communicate concerns to Nursing staff, all personal item bag and room cleared of any personal items, report called to [name], My Chart not access at this time, patient in stable condition at this time

## 2013-11-17 NOTE — Discharge Instructions (Signed)
WBAT bilateral lower extremities She will be limited by pain

## 2013-11-17 NOTE — Progress Notes (Signed)
CSW spoke with [name]. They are able to accept patient today.  Zed Wanninger C. Arvin Abello MSW, LCSW [phone]

## 2013-11-17 NOTE — Progress Notes (Addendum)
Patient alert and oriented, transferred to CT for head injury several days ago as follow-up, patient brought back from CT with transporters concerns about patient limit ability with movement and ambulation, patient had PT evaluation done today and walk with walker about 30 feet before needing to rest in chair assisted by PT, patient seen by Dr Charlann [facility] before going to CT for exam and plan of care was that patient could be up with  WBAT bilaterally, patient taken to CT while primary Nurse was at lunch by transporter and orders given earlier for wheelchair, but had plan on changing orders to bed due to fall risks, even with patient getting up to bedside commode on 11/16/2013 all day with limited assistance and sitting in chair all day, patient orientation has change from time of fall, when patient was very confused, patient orientation at this time is alert and oriented, patient able to communicate all concerns needed to assist in plan of care

## 2013-11-17 NOTE — Progress Notes (Signed)
Rounded on pt at 0215. Resting comfortably with no concerns at this time

## 2014-03-16 ENCOUNTER — Ambulatory Visit (INDEPENDENT_AMBULATORY_CARE_PROVIDER_SITE_OTHER): Payer: Medicare Other | Admitting: Internal Medicine

## 2014-03-16 ENCOUNTER — Encounter: Payer: Self-pay | Admitting: Internal Medicine

## 2014-03-16 VITALS — BP 132/80 | HR 99 | Ht 63.0 in | Wt 144.6 lb

## 2014-03-16 DIAGNOSIS — J449 Chronic obstructive pulmonary disease, unspecified: Secondary | ICD-10-CM

## 2014-03-16 DIAGNOSIS — I5189 Other ill-defined heart diseases: Secondary | ICD-10-CM

## 2014-03-16 DIAGNOSIS — Z954 Presence of other heart-valve replacement: Secondary | ICD-10-CM

## 2014-03-16 DIAGNOSIS — R55 Syncope and collapse: Secondary | ICD-10-CM

## 2014-03-16 DIAGNOSIS — Z952 Presence of prosthetic heart valve: Secondary | ICD-10-CM | POA: Insufficient documentation

## 2014-03-16 DIAGNOSIS — Z79899 Other long term (current) drug therapy: Secondary | ICD-10-CM

## 2014-03-16 DIAGNOSIS — I428 Other cardiomyopathies: Secondary | ICD-10-CM

## 2014-03-16 DIAGNOSIS — I350 Nonrheumatic aortic (valve) stenosis: Secondary | ICD-10-CM

## 2014-03-16 DIAGNOSIS — I519 Heart disease, unspecified: Secondary | ICD-10-CM

## 2014-03-16 DIAGNOSIS — R0609 Other forms of dyspnea: Secondary | ICD-10-CM

## 2014-03-16 DIAGNOSIS — I359 Nonrheumatic aortic valve disorder, unspecified: Secondary | ICD-10-CM

## 2014-03-16 DIAGNOSIS — R0989 Other specified symptoms and signs involving the circulatory and respiratory systems: Secondary | ICD-10-CM

## 2014-03-16 DIAGNOSIS — M069 Rheumatoid arthritis, unspecified: Secondary | ICD-10-CM

## 2014-03-16 MED ORDER — FUROSEMIDE 20 MG PO TABS
20.0000 mg | ORAL_TABLET | Freq: Every day | ORAL | Status: DC
Start: 2014-03-16 — End: 2014-09-21

## 2014-03-16 MED ORDER — BISOPROLOL FUMARATE 5 MG PO TABS
5.0000 mg | ORAL_TABLET | Freq: Every day | ORAL | Status: DC
Start: 2014-03-16 — End: 2014-11-08

## 2014-03-16 NOTE — Patient Instructions (Signed)
Your physician has recommended you make the following change in your medication... START 1. Furosemide 20mg  once daily 2. Bisoprolol 5mg  once daily  Your physician recommends that you return for lab work in 1 week.   Your physician has requested that you have an echocardiogram. Echocardiography is a painless test that uses sound waves to create images of your heart. It provides your doctor with information about the size and shape of your heart and how well your heart's chambers and valves are working. This procedure takes approximately one hour. There are no restrictions for this procedure.  Your physician wants you to follow-up in: 6 months. You will receive a reminder letter in the mail two months in advance. If you don't receive a letter, please call our office to schedule the follow-up appointment.
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
## 2014-03-23 ENCOUNTER — Ambulatory Visit (HOSPITAL_COMMUNITY)
Admission: RE | Admit: 2014-03-23 | Discharge: 2014-03-23 | Disposition: A | Discharge status: Home / Self Care | Payer: Medicare Other | Source: Ambulatory Visit | Attending: Cardiovascular Disease | Admitting: Cardiovascular Disease

## 2014-03-23 DIAGNOSIS — I5189 Other ill-defined heart diseases: Secondary | ICD-10-CM

## 2014-03-23 DIAGNOSIS — R0989 Other specified symptoms and signs involving the circulatory and respiratory systems: Secondary | ICD-10-CM | POA: Insufficient documentation

## 2014-03-23 DIAGNOSIS — I428 Other cardiomyopathies: Secondary | ICD-10-CM

## 2014-03-23 DIAGNOSIS — R0609 Other forms of dyspnea: Secondary | ICD-10-CM | POA: Insufficient documentation

## 2014-03-23 DIAGNOSIS — Z952 Presence of prosthetic heart valve: Secondary | ICD-10-CM

## 2014-03-23 DIAGNOSIS — I429 Cardiomyopathy, unspecified: Secondary | ICD-10-CM | POA: Insufficient documentation

## 2014-03-23 DIAGNOSIS — Z954 Presence of other heart-valve replacement: Secondary | ICD-10-CM | POA: Insufficient documentation

## 2014-03-23 DIAGNOSIS — I059 Rheumatic mitral valve disease, unspecified: Secondary | ICD-10-CM

## 2014-03-23 NOTE — Progress Notes (Signed)
2D Echo Performed 03/23/2014    Ronnae Kaser, RCS  

## 2014-03-24 LAB — BASIC METABOLIC PANEL
BUN: 14 mg/dL (ref 6–23)
CO2: 32 mEq/L (ref 19–32)
Calcium: 8.9 mg/dL (ref 8.4–10.5)
Chloride: 95 mEq/L — ABNORMAL LOW (ref 96–112)
Creat: 0.64 mg/dL (ref 0.50–1.10)
Glucose, Bld: 121 mg/dL — ABNORMAL HIGH (ref 70–99)
POTASSIUM: 4.3 meq/L (ref 3.5–5.3)
SODIUM: 140 meq/L (ref 135–145)

## 2014-03-24 LAB — BRAIN NATRIURETIC PEPTIDE: BRAIN NATRIURETIC PEPTIDE: 65.2 pg/mL (ref 0.0–100.0)

## 2014-03-24 NOTE — Progress Notes (Signed)
RN attempted to call patient. No VM set up

## 2014-09-09 ENCOUNTER — Emergency Department (HOSPITAL_COMMUNITY): Payer: Medicare Other

## 2014-09-09 ENCOUNTER — Inpatient Hospital Stay (HOSPITAL_COMMUNITY)
Admission: EM | Admit: 2014-09-09 | Discharge: 2014-09-16 | DRG: 871 | Disposition: A | Discharge status: Home / Self Care | Payer: Medicare Other | Attending: Internal Medicine | Admitting: Internal Medicine

## 2014-09-09 ENCOUNTER — Encounter (HOSPITAL_COMMUNITY): Payer: Self-pay | Admitting: Emergency Medicine

## 2014-09-09 DIAGNOSIS — I509 Heart failure, unspecified: Secondary | ICD-10-CM | POA: Diagnosis present

## 2014-09-09 DIAGNOSIS — R4182 Altered mental status, unspecified: Secondary | ICD-10-CM | POA: Diagnosis not present

## 2014-09-09 DIAGNOSIS — G9341 Metabolic encephalopathy: Secondary | ICD-10-CM | POA: Diagnosis present

## 2014-09-09 DIAGNOSIS — F329 Major depressive disorder, single episode, unspecified: Secondary | ICD-10-CM | POA: Diagnosis present

## 2014-09-09 DIAGNOSIS — N390 Urinary tract infection, site not specified: Secondary | ICD-10-CM | POA: Diagnosis present

## 2014-09-09 DIAGNOSIS — E119 Type 2 diabetes mellitus without complications: Secondary | ICD-10-CM | POA: Diagnosis present

## 2014-09-09 DIAGNOSIS — A4151 Sepsis due to Escherichia coli [E. coli]: Secondary | ICD-10-CM | POA: Diagnosis present

## 2014-09-09 DIAGNOSIS — M069 Rheumatoid arthritis, unspecified: Secondary | ICD-10-CM | POA: Diagnosis present

## 2014-09-09 DIAGNOSIS — M810 Age-related osteoporosis without current pathological fracture: Secondary | ICD-10-CM | POA: Diagnosis present

## 2014-09-09 DIAGNOSIS — J8410 Pulmonary fibrosis, unspecified: Secondary | ICD-10-CM | POA: Diagnosis present

## 2014-09-09 DIAGNOSIS — F32A Depression, unspecified: Secondary | ICD-10-CM | POA: Diagnosis present

## 2014-09-09 DIAGNOSIS — R627 Adult failure to thrive: Secondary | ICD-10-CM | POA: Diagnosis present

## 2014-09-09 DIAGNOSIS — E271 Primary adrenocortical insufficiency: Secondary | ICD-10-CM | POA: Diagnosis present

## 2014-09-09 DIAGNOSIS — R413 Other amnesia: Secondary | ICD-10-CM | POA: Diagnosis present

## 2014-09-09 DIAGNOSIS — M0689 Other specified rheumatoid arthritis, multiple sites: Secondary | ICD-10-CM | POA: Diagnosis present

## 2014-09-09 DIAGNOSIS — Z880 Allergy status to penicillin: Secondary | ICD-10-CM | POA: Diagnosis not present

## 2014-09-09 DIAGNOSIS — E2740 Unspecified adrenocortical insufficiency: Secondary | ICD-10-CM | POA: Diagnosis present

## 2014-09-09 DIAGNOSIS — K868 Other specified diseases of pancreas: Secondary | ICD-10-CM | POA: Diagnosis present

## 2014-09-09 DIAGNOSIS — I350 Nonrheumatic aortic (valve) stenosis: Secondary | ICD-10-CM | POA: Diagnosis present

## 2014-09-09 DIAGNOSIS — R531 Weakness: Secondary | ICD-10-CM

## 2014-09-09 DIAGNOSIS — Z952 Presence of prosthetic heart valve: Secondary | ICD-10-CM

## 2014-09-09 DIAGNOSIS — A419 Sepsis, unspecified organism: Secondary | ICD-10-CM | POA: Diagnosis present

## 2014-09-09 LAB — URINALYSIS, ROUTINE W REFLEX MICROSCOPIC
Glucose, UA: NEGATIVE mg/dL
Nitrite: POSITIVE — AB
Protein, ur: 100 mg/dL — AB
SPECIFIC GRAVITY, URINE: 1.023 (ref 1.005–1.030)
UROBILINOGEN UA: 1 mg/dL (ref 0.0–1.0)
pH: 6 (ref 5.0–8.0)

## 2014-09-09 LAB — COMPREHENSIVE METABOLIC PANEL
ALBUMIN: 2.9 g/dL — AB (ref 3.5–5.2)
ALK PHOS: 75 U/L (ref 39–117)
ALT: 7 U/L (ref 0–35)
ANION GAP: 20 — AB (ref 5–15)
AST: 13 U/L (ref 0–37)
BILIRUBIN TOTAL: 1.3 mg/dL — AB (ref 0.3–1.2)
BUN: 15 mg/dL (ref 6–23)
CHLORIDE: 98 meq/L (ref 96–112)
CO2: 20 meq/L (ref 19–32)
CREATININE: 0.56 mg/dL (ref 0.50–1.10)
Calcium: 9.3 mg/dL (ref 8.4–10.5)
GFR, EST NON AFRICAN AMERICAN: 88 mL/min — AB (ref >90)
GLUCOSE: 128 mg/dL — AB (ref 70–99)
POTASSIUM: 4.1 meq/L (ref 3.7–5.3)
Sodium: 138 mEq/L (ref 137–147)
Total Protein: 6.9 g/dL (ref 6.0–8.3)

## 2014-09-09 LAB — URINE MICROSCOPIC-ADD ON

## 2014-09-09 LAB — CBC
HCT: 42.4 % (ref 36.0–46.0)
HEMOGLOBIN: 14.2 g/dL (ref 12.0–15.0)
MCH: 30.1 pg (ref 26.0–34.0)
MCHC: 33.5 g/dL (ref 30.0–36.0)
MCV: 90 fL (ref 78.0–100.0)
Platelets: 219 10*3/uL (ref 150–400)
RBC: 4.71 MIL/uL (ref 3.87–5.11)
RDW: 13.2 % (ref 11.5–15.5)
WBC: 14.1 10*3/uL — AB (ref 4.0–10.5)

## 2014-09-09 LAB — LACTIC ACID, PLASMA: Lactic Acid, Venous: 1.6 mmol/L (ref 0.5–2.2)

## 2014-09-09 MED ORDER — ONDANSETRON HCL 4 MG PO TABS: 4.0000 mg | ORAL_TABLET | Freq: Four times a day (QID) | ORAL | Status: DC | PRN

## 2014-09-09 MED ORDER — METHYLPREDNISOLONE SODIUM SUCC 125 MG IJ SOLR
60.0000 mg | Freq: Once | INTRAMUSCULAR | Status: AC
  Administered 2014-09-09: 60 mg via INTRAVENOUS
  Filled 2014-09-09: qty 2

## 2014-09-09 MED ORDER — DEXTROSE 5 % IV SOLN
1.0000 g | INTRAVENOUS | Status: DC
  Administered 2014-09-09 – 2014-09-11 (×3): 1 g via INTRAVENOUS
  Filled 2014-09-09 (×3): qty 10

## 2014-09-09 MED ORDER — SODIUM CHLORIDE 0.9 % IV SOLN
INTRAVENOUS | Status: DC
  Administered 2014-09-09 – 2014-09-10 (×2): via INTRAVENOUS

## 2014-09-09 MED ORDER — BISOPROLOL FUMARATE 5 MG PO TABS
5.0000 mg | ORAL_TABLET | Freq: Two times a day (BID) | ORAL | Status: DC
  Administered 2014-09-09: 5 mg via ORAL
  Filled 2014-09-09 (×3): qty 1

## 2014-09-09 MED ORDER — ACETAMINOPHEN 325 MG PO TABS
650.0000 mg | ORAL_TABLET | Freq: Four times a day (QID) | ORAL | Status: DC | PRN
  Administered 2014-09-10 – 2014-09-11 (×2): 650 mg via ORAL
  Filled 2014-09-09 (×2): qty 2

## 2014-09-09 MED ORDER — ENOXAPARIN SODIUM 30 MG/0.3ML ~~LOC~~ SOLN
30.0000 mg | SUBCUTANEOUS | Status: DC
  Administered 2014-09-09 – 2014-09-10 (×2): 30 mg via SUBCUTANEOUS
  Filled 2014-09-09 (×3): qty 0.3

## 2014-09-09 MED ORDER — LATANOPROST 0.005 % OP SOLN
1.0000 [drp] | Freq: Every day | OPHTHALMIC | Status: DC
  Administered 2014-09-10 – 2014-09-15 (×7): 1 [drp] via OPHTHALMIC
  Filled 2014-09-09: qty 2.5

## 2014-09-09 MED ORDER — SODIUM CHLORIDE 0.9 % IJ SOLN
3.0000 mL | Freq: Two times a day (BID) | INTRAMUSCULAR | Status: DC
  Administered 2014-09-10 – 2014-09-16 (×12): 3 mL via INTRAVENOUS

## 2014-09-09 MED ORDER — SODIUM CHLORIDE 0.9 % IV BOLUS (SEPSIS)
500.0000 mL | Freq: Once | INTRAVENOUS | Status: AC
  Administered 2014-09-09: 500 mL via INTRAVENOUS

## 2014-09-09 MED ORDER — VITAMIN D3 25 MCG (1000 UNIT) PO TABS
5000.0000 [IU] | ORAL_TABLET | Freq: Every day | ORAL | Status: DC
  Administered 2014-09-10 – 2014-09-16 (×7): 5000 [IU] via ORAL
  Filled 2014-09-09 (×7): qty 5

## 2014-09-09 MED ORDER — NORTRIPTYLINE HCL 25 MG PO CAPS
75.0000 mg | ORAL_CAPSULE | Freq: Every day | ORAL | Status: DC
  Administered 2014-09-10 – 2014-09-15 (×6): 75 mg via ORAL
  Filled 2014-09-09 (×7): qty 3

## 2014-09-09 MED ORDER — BUPROPION HCL 75 MG PO TABS
75.0000 mg | ORAL_TABLET | Freq: Every day | ORAL | Status: DC
  Administered 2014-09-10 – 2014-09-16 (×7): 75 mg via ORAL
  Filled 2014-09-09 (×8): qty 1

## 2014-09-09 MED ORDER — DEXTROSE 5 % IV SOLN
1.0000 g | Freq: Once | INTRAVENOUS | Status: AC
  Administered 2014-09-09: 1 g via INTRAVENOUS
  Filled 2014-09-09: qty 10

## 2014-09-09 MED ORDER — ACETAMINOPHEN 325 MG PO TABS
650.0000 mg | ORAL_TABLET | Freq: Once | ORAL | Status: AC
  Administered 2014-09-09: 650 mg via ORAL
  Filled 2014-09-09: qty 2

## 2014-09-09 MED ORDER — ONDANSETRON HCL 4 MG/2ML IJ SOLN: 4.0000 mg | Freq: Four times a day (QID) | INTRAMUSCULAR | Status: DC | PRN

## 2014-09-09 MED ORDER — ACETAMINOPHEN 650 MG RE SUPP: 650.0000 mg | Freq: Four times a day (QID) | RECTAL | Status: DC | PRN

## 2014-09-09 MED ORDER — TRAVOPROST (BAK FREE) 0.004 % OP SOLN
1.0000 [drp] | Freq: Every day | OPHTHALMIC | Status: DC
  Filled 2014-09-09: qty 2.5

## 2014-09-09 MED ORDER — PREDNISONE 20 MG PO TABS
20.0000 mg | ORAL_TABLET | Freq: Every day | ORAL | Status: DC
  Administered 2014-09-10 – 2014-09-16 (×7): 20 mg via ORAL
  Filled 2014-09-09 (×8): qty 1

## 2014-09-09 MED ORDER — SODIUM CHLORIDE 0.9 % IV BOLUS (SEPSIS)
1000.0000 mL | Freq: Once | INTRAVENOUS | Status: AC
  Administered 2014-09-09: 1000 mL via INTRAVENOUS

## 2014-09-09 NOTE — ED Notes (Signed)
Bed: WA04 Expected date:  Expected time:  Means of arrival:  Comments: [facility] 

## 2014-09-09 NOTE — Progress Notes (Signed)
Call to get report per Debbie the nurse will call back within 5 mins.

## 2014-09-09 NOTE — ED Provider Notes (Addendum)
CSN: [id number]     Arrival date & time 09/09/14  1823 History   First MD Initiated Contact with Patient 09/09/14 1829     Chief Complaint  Patient presents with  . Altered Mental Status     (Consider location/radiation/quality/duration/timing/severity/associated sxs/prior Treatment) The history is provided by the patient and the EMS personnel. The history is limited by the condition of the patient.  pt with hx utis, presents w altered mental status, confusion, generalized weakness and nv.  Per report of family member, pt w similar symptoms in past due to uti. Pt arrives to ED, alert, and confused, not responding to majority of questions - level 5 caveat.  No fevers. No headache. No trauma/fall.      Past Medical History  Diagnosis Date  . Osteoporosis   . Rheumatoid arthritis(714.0)   . Murmur, cardiac   . Pneumonia   . Anxiety   . Adrenal insufficiency, primary, iatrogenic   . Gait instability   . Physical deconditioning   . SOBOE (shortness of breath on exertion)     echo 02/23/13-EF 55-60%, possible bicuspid aortic valve with mod to severed stenosis   . Palpitations   . Chest pain     myoview 04/13/04-inferoapical thinning with a suggestion of mild apical ischemia, EF 66%  . Syncope   . Aortic stenosis, severe 02/26/2013  . Pancreatic insufficiency 11/09/2012  . UTI (lower urinary tract infection) 10/01/2011  . Hearing loss 03/25/2013  . Memory deficit 03/25/2013  . Depression 03/25/2013  . Pulmonary fibrosis 03/25/2013  . Coronary artery disease    Past Surgical History  Procedure Laterality Date  . Knee arthroscopy      2 on left  . Cholecystectomy    . Thyroidectomy    . Abdominal hysterectomy    . Cardiac catheterization  11/08/96    no evidence of CAD, nl LV function  . Shoulder surgery Left    Family History  Problem Relation Age of Onset  . Stroke Brother   . Cancer - Colon Father   . Cancer Sister     spine?   History  Substance Use Topics  . Smoking  status: Never Smoker   . Smokeless tobacco: Never Used  . Alcohol Use: No   OB History    No data available        Review of Systems  Unable to perform ROS: Mental status change  level 5 caveat, altered mental status, decreased responsiveness.     Allergies  Penicillins  Home Medications   Prior to Admission medications   Medication Sig Start Date End Date Taking? Authorizing Provider  ALPRAZolam Prudy Feeler) 1 MG tablet Take 1 tablet (1 mg total) by mouth 3 (three) times daily as needed for anxiety. 11/17/13   Nishant Dhungel, MD  amitriptyline (ELAVIL) 25 MG tablet Take 75 mg by mouth at bedtime.     Historical Provider, MD  bisoprolol (ZEBETA) 5 MG tablet Take 1 tablet (5 mg total) by mouth daily. 03/16/14   [name], MD  buPROPion (WELLBUTRIN) 75 MG tablet Take 75 mg by mouth every morning.     Historical Provider, MD  Cholecalciferol (VITAMIN D3) 5000 UNITS CAPS Take 5,000 Units by mouth daily.    Historical Provider, MD  furosemide (LASIX) 20 MG tablet Take 1 tablet (20 mg total) by mouth daily. 03/16/14   [name], MD  HYDROcodone-acetaminophen (NORCO/VICODIN) 5-325 MG per tablet Take 1 tablet by mouth at bedtime as needed for moderate pain. 11/17/13  Nishant Dhungel, MD  Menaquinone-7 (VITAMIN K2 PO) Take 1 tablet by mouth daily.    Historical Provider, MD  mirtazapine (REMERON) 30 MG tablet Take 30 mg by mouth at bedtime.     Historical Provider, MD  predniSONE (DELTASONE) 5 MG tablet Take 2.5 mg by mouth daily with breakfast.    Historical Provider, MD   BP 135/77 mmHg  Pulse 130  Temp(Src) 101.4 F (38.6 C) (Rectal)  SpO2 93% Physical Exam  Constitutional: She appears well-developed and well-nourished. No distress.  HENT:  Head: Atraumatic.  Mouth/Throat: Oropharynx is clear and moist.  Eyes: Conjunctivae are normal. Pupils are equal, round, and reactive to light. No scleral icterus.  Neck: Normal range of motion. Neck supple. No tracheal deviation  present. No thyromegaly present.  No stiffness or rigidity. No bruit.   Cardiovascular: Normal rate, regular rhythm and intact distal pulses.   Murmur heard. Pulmonary/Chest: Effort normal and breath sounds normal. No respiratory distress.  Abdominal: Soft. Normal appearance and bowel sounds are normal. She exhibits no distension and no mass. There is no tenderness. There is no rebound and no guarding.  Genitourinary:  No cva tenderness  Musculoskeletal: She exhibits no edema or tenderness.  Neurological: She is alert.  Awake and alert. Moves bil ext purposefully w good strength.   Skin: Skin is warm and dry. No rash noted. She is not diaphoretic.  Psychiatric:  Confused.   Nursing note and vitals reviewed.   ED Course  Procedures (including critical care time) Labs Review  Results for orders placed or performed during the hospital encounter of 09/09/14  CBC  Result Value Ref Range   WBC 14.1 (H) 4.0 - 10.5 K/uL   RBC 4.71 3.87 - 5.11 MIL/uL   Hemoglobin 14.2 12.0 - 15.0 g/dL   HCT 00.1 74.9 - 44.9 %   MCV 90.0 78.0 - 100.0 fL   MCH 30.1 26.0 - 34.0 pg   MCHC 33.5 30.0 - 36.0 g/dL   RDW 67.5 91.6 - 38.4 %   Platelets 219 150 - 400 K/uL  Comprehensive metabolic panel  Result Value Ref Range   Sodium 138 137 - 147 mEq/L   Potassium 4.1 3.7 - 5.3 mEq/L   Chloride 98 96 - 112 mEq/L   CO2 20 19 - 32 mEq/L   Glucose, Bld 128 (H) 70 - 99 mg/dL   BUN 15 6 - 23 mg/dL   Creatinine, Ser 6.65 0.50 - 1.10 mg/dL   Calcium 9.3 8.4 - 99.3 mg/dL   Total Protein 6.9 6.0 - 8.3 g/dL   Albumin 2.9 (L) 3.5 - 5.2 g/dL   AST 13 0 - 37 U/L   ALT 7 0 - 35 U/L   Alkaline Phosphatase 75 39 - 117 U/L   Total Bilirubin 1.3 (H) 0.3 - 1.2 mg/dL   GFR calc non Af Amer 88 (L) >90 mL/min   GFR calc Af Amer >90 >90 mL/min   Anion gap 20 (H) 5 - 15  Lactic acid, plasma  Result Value Ref Range   Lactic Acid, Venous 1.6 0.5 - 2.2 mmol/L  Urinalysis, Routine w reflex microscopic  Result Value Ref  Range   Color, Urine AMBER (A) YELLOW   APPearance TURBID (A) CLEAR   Specific Gravity, Urine 1.023 1.005 - 1.030   pH 6.0 5.0 - 8.0   Glucose, UA NEGATIVE NEGATIVE mg/dL   Hgb urine dipstick MODERATE (A) NEGATIVE   Bilirubin Urine SMALL (A) NEGATIVE   Ketones, ur >80 (A)  NEGATIVE mg/dL   Protein, ur 542 (A) NEGATIVE mg/dL   Urobilinogen, UA 1.0 0.0 - 1.0 mg/dL   Nitrite POSITIVE (A) NEGATIVE   Leukocytes, UA LARGE (A) NEGATIVE  Urine microscopic-add on  Result Value Ref Range   Squamous Epithelial / LPF RARE RARE   WBC, UA TOO NUMEROUS TO COUNT <3 WBC/hpf   RBC / HPF 0-2 <3 RBC/hpf   Bacteria, UA MANY (A) RARE   Dg Chest 2 View  09/09/2014   CLINICAL DATA:  Altered mental status with weakness. History of pneumonia. Initial encounter.  EXAM: CHEST  2 VIEW  COMPARISON:  11/10/2013 radiographs; chest CT 03/30/2013.  FINDINGS: The heart size and mediastinal contours are stable. Ascending aortic stent graft and diffuse vascular calcifications are stable. There is chronic blunting of the left costophrenic angle. The left basilar aeration has improved compared with the prior study. There is no airspace disease or edema. The bones are demineralized. A mid thoracic compression deformity appears stable (T6 on prior CT). However, there is a new mild superior endplate compression deformity at T4.  IMPRESSION: Stable chronic pleural parenchymal density at the left lung base status post stent grafting. Interval T4 compression deformity.   Electronically Signed   By: Roxy Horseman M.D.   On: 09/09/2014 20:08     Date: 09/09/2014  Rate: 116  Rhythm: sinus tachycardia  QRS Axis: normal  Intervals: QT prolonged  ST/T Wave abnormalities: nonspecific ST/T changes  Conduction Disutrbances:none  Narrative Interpretation:   Old EKG Reviewed: changes noted Similar to ecg 11/09/13   MDM  Iv ns bolus. Labs. Urine.  Cath urine sent.  Reviewed nursing notes and prior charts for additional history.    Pt with uti, u cx sent. Rocephin iv, ivf.   Given altered ms, weakness, uti, tachycardia - med service contacted for admission.  Discussed pt with Dr Timothy Lasso - he will admit.      Suzi Roots, MD 09/09/14 2156

## 2014-09-09 NOTE — H&P (Signed)
[name] is an 79 y.o. female.   PCP:   Leah Fillers, MD   Chief Complaint:  AMS, Fever, Chills, UTI, leukocytosis   HPI: 79 f c RA on chronic Prednisone, Severe AS s/p TAVR Oct 2014 at [facility], chronic Heart failure, DM2 and many other med issues.  She saw [facility] in Oct and Dr Eloise Harman in Sept.  She lives independently.  Daughter came and checked on her today and she had sig AMS c/w prior UTIs.  She was brought to ED c AMS, Gen Weakness, confusion, DeH, 101.5, WBC 14, rest of labs fine, Sinus Tachy 130's, and Dxed c UTI.  I was called for inpt admission.  Last ECHO 03/23/14 EF 50-55%-prior TAVR-Mild central and minimal paravalvular leak.  Rocephin started.  U Cx sent.  IVF started.  On exam she is conversant but confused - daughter says she is much better than presentation.   Past Medical History:  Past Medical History  Diagnosis Date  . Osteoporosis   . Rheumatoid arthritis(714.0)   . Murmur, cardiac   . Pneumonia   . Anxiety   . Adrenal insufficiency, primary, iatrogenic   . Gait instability   . Physical deconditioning   . SOBOE (shortness of breath on exertion)     echo 02/23/13-EF 55-60%, possible bicuspid aortic valve with mod to severed stenosis   . Palpitations   . Chest pain     myoview 04/13/04-inferoapical thinning with a suggestion of mild apical ischemia, EF 66%  . Syncope   . Aortic stenosis, severe 02/26/2013  . Pancreatic insufficiency 11/09/2012  . UTI (lower urinary tract infection) 10/01/2011  . Hearing loss 03/25/2013  . Memory deficit 03/25/2013  . Depression 03/25/2013  . Pulmonary fibrosis 03/25/2013  . Coronary artery disease     Past Surgical History  Procedure Laterality Date  . Knee arthroscopy      2 on left  . Cholecystectomy    . Thyroidectomy    . Abdominal hysterectomy    . Cardiac catheterization  11/08/96    no evidence of CAD, nl LV function  . Shoulder surgery Left       Allergies:   Allergies  Allergen Reactions  . Penicillins Hives      Medications: Prior to Admission medications   Medication Sig Start Date End Date Taking? Authorizing Provider  ALPRAZolam Prudy Feeler) 1 MG tablet Take 1 tablet (1 mg total) by mouth 3 (three) times daily as needed for anxiety. 11/17/13  Yes Nishant Dhungel, MD  bisoprolol (ZEBETA) 5 MG tablet Take 1 tablet (5 mg total) by mouth daily. 03/16/14  Yes [name], MD  buPROPion (WELLBUTRIN) 75 MG tablet Take 75 mg by mouth every morning.    Yes Historical Provider, MD  Cholecalciferol (VITAMIN D3) 5000 UNITS CAPS Take 5,000 Units by mouth daily.   Yes Historical Provider, MD  Menaquinone-7 (VITAMIN K2 PO) Take 1 tablet by mouth daily.   Yes Historical Provider, MD  mirtazapine (REMERON) 30 MG tablet Take 30 mg by mouth at bedtime.    Yes Historical Provider, MD  nortriptyline (PAMELOR) 75 MG capsule Take 75 mg by mouth at bedtime.   Yes Historical Provider, MD  predniSONE (DELTASONE) 5 MG tablet Take 7.5 mg by mouth daily with breakfast.    Yes Historical Provider, MD  travoprost, benzalkonium, (TRAVATAN) 0.004 % ophthalmic solution Place 1 drop into both eyes at bedtime.   Yes Historical Provider, MD  furosemide (LASIX) 20 MG tablet Take 1 tablet (20 mg  total) by mouth daily. 03/16/14   [name], MD  HYDROcodone-acetaminophen (NORCO/VICODIN) 5-325 MG per tablet Take 1 tablet by mouth at bedtime as needed for moderate pain. 11/17/13   Nishant Dhungel, MD      (Not in a hospital admission)   Social History:  reports that she has never smoked. She has never used smokeless tobacco. She reports that she does not drink alcohol or use illicit drugs.  Family History: Family History  Problem Relation Age of Onset  . Stroke Brother   . Cancer - Colon Father   . Cancer Sister     spine?    Review of Systems:  Review of Systems - See HPI. She denies all and any issues.   Physical Exam:  Blood pressure 135/77, pulse 130, temperature 101.4 F (38.6 C), temperature source Rectal,  SpO2 93 %. Filed Vitals:   09/09/14 1826 09/09/14 1838  BP:  135/77  Pulse:  130  Temp:  101.4 F (38.6 C)  TempSrc:  Rectal  SpO2: 96% 93%   General appearance: awake, interacting, confused Head: Normocephalic, without obvious abnormality, atraumatic Eyes: conjunctivae/corneas clear. PERRL, EOM's intact.  Nose: Nares normal. Septum midline. Mucosa normal. No drainage or sinus tenderness. OP - dry Neck: no adenopathy, no carotid bruit, no JVD and thyroid not enlarged, symmetric, no tenderness/mass/nodules Resp: distant but clear Cardio: reg/tachy/min SEM GI: soft, non-tender; bowel sounds normal; no masses,  no organomegaly Extremities: extremities normal, atraumatic, no cyanosis or edema, multiple ortho scars Pulses: 2+ and symmetric Lymph nodes: no cervical lymphadenopathy Neurologic: generalized weakness, nothing focal.     Labs on Admission:   Recent Labs  09/09/14 1849  NA 138  K 4.1  CL 98  CO2 20  GLUCOSE 128*  BUN 15  CREATININE 0.56  CALCIUM 9.3    Recent Labs  09/09/14 1849  AST 13  ALT 7  ALKPHOS 75  BILITOT 1.3*  PROT 6.9  ALBUMIN 2.9*   No results for input(s): LIPASE, AMYLASE in the last 72 hours.  Recent Labs  09/09/14 1849  WBC 14.1*  HGB 14.2  HCT 42.4  MCV 90.0  PLT 219   No results for input(s): CKTOTAL, CKMB, CKMBINDEX, TROPONINI in the last 72 hours. Lab Results  Component Value Date   INR 0.89 03/16/2013   INR 0.87 09/16/2011   INR 2.6* 05/12/2008     LAB RESULT POCT:  Results for orders placed or performed during the hospital encounter of 09/09/14  CBC  Result Value Ref Range   WBC 14.1 (H) 4.0 - 10.5 K/uL   RBC 4.71 3.87 - 5.11 MIL/uL   Hemoglobin 14.2 12.0 - 15.0 g/dL   HCT 65.7 90.3 - 83.3 %   MCV 90.0 78.0 - 100.0 fL   MCH 30.1 26.0 - 34.0 pg   MCHC 33.5 30.0 - 36.0 g/dL   RDW 38.3 29.1 - 91.6 %   Platelets 219 150 - 400 K/uL  Comprehensive metabolic panel  Result Value Ref Range   Sodium 138 137 - 147  mEq/L   Potassium 4.1 3.7 - 5.3 mEq/L   Chloride 98 96 - 112 mEq/L   CO2 20 19 - 32 mEq/L   Glucose, Bld 128 (H) 70 - 99 mg/dL   BUN 15 6 - 23 mg/dL   Creatinine, Ser 6.06 0.50 - 1.10 mg/dL   Calcium 9.3 8.4 - 00.4 mg/dL   Total Protein 6.9 6.0 - 8.3 g/dL   Albumin 2.9 (L) 3.5 - 5.2  g/dL   AST 13 0 - 37 U/L   ALT 7 0 - 35 U/L   Alkaline Phosphatase 75 39 - 117 U/L   Total Bilirubin 1.3 (H) 0.3 - 1.2 mg/dL   GFR calc non Af Amer 88 (L) >90 mL/min   GFR calc Af Amer >90 >90 mL/min   Anion gap 20 (H) 5 - 15  Lactic acid, plasma  Result Value Ref Range   Lactic Acid, Venous 1.6 0.5 - 2.2 mmol/L  Urinalysis, Routine w reflex microscopic  Result Value Ref Range   Color, Urine AMBER (A) YELLOW   APPearance TURBID (A) CLEAR   Specific Gravity, Urine 1.023 1.005 - 1.030   pH 6.0 5.0 - 8.0   Glucose, UA NEGATIVE NEGATIVE mg/dL   Hgb urine dipstick MODERATE (A) NEGATIVE   Bilirubin Urine SMALL (A) NEGATIVE   Ketones, ur >80 (A) NEGATIVE mg/dL   Protein, ur 449 (A) NEGATIVE mg/dL   Urobilinogen, UA 1.0 0.0 - 1.0 mg/dL   Nitrite POSITIVE (A) NEGATIVE   Leukocytes, UA LARGE (A) NEGATIVE  Urine microscopic-add on  Result Value Ref Range   Squamous Epithelial / LPF RARE RARE   WBC, UA TOO NUMEROUS TO COUNT <3 WBC/hpf   RBC / HPF 0-2 <3 RBC/hpf   Bacteria, UA MANY (A) RARE      Radiological Exams on Admission: Dg Chest 2 View  09/09/2014   CLINICAL DATA:  Altered mental status with weakness. History of pneumonia. Initial encounter.  EXAM: CHEST  2 VIEW  COMPARISON:  11/10/2013 radiographs; chest CT 03/30/2013.  FINDINGS: The heart size and mediastinal contours are stable. Ascending aortic stent graft and diffuse vascular calcifications are stable. There is chronic blunting of the left costophrenic angle. The left basilar aeration has improved compared with the prior study. There is no airspace disease or edema. The bones are demineralized. A mid thoracic compression deformity appears  stable (T6 on prior CT). However, there is a new mild superior endplate compression deformity at T4.  IMPRESSION: Stable chronic pleural parenchymal density at the left lung base status post stent grafting. Interval T4 compression deformity.   Electronically Signed   By: Roxy Horseman M.D.   On: 09/09/2014 20:08      Orders placed or performed during the hospital encounter of 09/09/14  . EKG 12-Lead  . EKG 12-Lead     Assessment/Plan Active Problems:   Failure to thrive in adult   Rheumatoid arthritis involving multiple joints   DM2 (diabetes mellitus, type 2)   Adrenal insufficiency, primary, iatrogenic   Aortic stenosis, severe   Memory deficit   Depression   UTI (lower urinary tract infection)   S/P TAVR (transcatheter aortic valve replacement)   Sepsis d/t UTI c fever to 101.5, AMS/Delirium, Gen Weakness, DeH - IV Abx and IVF.  F/Up on Cx's  Leukocytosis secondary to illness  Sinus Tachy - Bisoprolol. Increase to BID  Iatrogenic Adrenal Insuffiency - Stress dose steroids.  RA - Prednisone  Severe symptomatic aortic stenosis - s/p 29 mm TAVR/CoreValve placement at [facility] Oct 2014 at [facility] - Last ECHO 03/23/14 EF 50-55%-prior TAVR-Mild central and minimal paravalvular leak. Just seen at [facility] 10/30 and was doing well - had another ECHO s report - she was told it was fine.  chronic Heart failure - Off Lasix and other meds - S Sx  DM2 - ISS  Marked pulmonary fibrosis with a low diffusion capacity - use O2 while ill.  Osteoporosis c compression Fxs  Depression - Meds.  Pancreatic Insuff - not on Pancreatic enzymes  DVT proph - Lovenox  Hagar Sadiq M 09/09/2014, 9:12 PM

## 2014-09-09 NOTE — Progress Notes (Signed)
CSW met with Pt. Daughter [name]) was at bedside. Per daughter, Pt has a history of UTI's, but has not had a UTI for a while. Daughter informed CSW that she called the ambulance pick up mom. She states the mom had thrown up at home and has had chest congestion within the last day.  Daughter states Pt lives alone at her own home. Daughter also says she is the Pt's primary contact. Daughter believes that she would like Pt to return home upon discharge.   Daughter([name]) [place], [place] ED CSW 09/09/2014 7:58 PM

## 2014-09-09 NOTE — ED Notes (Signed)
0600 this am pt was with daughter, up and about, acting normal. This afternoon pt was found in bed, had urinated on self. Per daughter pt has had multiple UTIs in past, and this is how she presents

## 2014-09-10 LAB — GLUCOSE, CAPILLARY
GLUCOSE-CAPILLARY: 106 mg/dL — AB (ref 70–99)
Glucose-Capillary: 153 mg/dL — ABNORMAL HIGH (ref 70–99)
Glucose-Capillary: 141 mg/dL — ABNORMAL HIGH (ref 70–99)
Glucose-Capillary: 169 mg/dL — ABNORMAL HIGH (ref 70–99)
Glucose-Capillary: 154 mg/dL — ABNORMAL HIGH (ref 70–99)

## 2014-09-10 LAB — BASIC METABOLIC PANEL
ANION GAP: 14 (ref 5–15)
BUN: 15 mg/dL (ref 6–23)
CHLORIDE: 103 meq/L (ref 96–112)
CO2: 21 mEq/L (ref 19–32)
Calcium: 8.7 mg/dL (ref 8.4–10.5)
Creatinine, Ser: 0.51 mg/dL (ref 0.50–1.10)
GFR calc non Af Amer: >90 mL/min (ref >90)
Glucose, Bld: 156 mg/dL — ABNORMAL HIGH (ref 70–99)
Potassium: 4.2 mEq/L (ref 3.7–5.3)
Sodium: 138 mEq/L (ref 137–147)

## 2014-09-10 LAB — CBC
HCT: 39 % (ref 36.0–46.0)
Hemoglobin: 12.6 g/dL (ref 12.0–15.0)
MCH: 29.2 pg (ref 26.0–34.0)
MCHC: 32.3 g/dL (ref 30.0–36.0)
MCV: 90.3 fL (ref 78.0–100.0)
Platelets: 208 10*3/uL (ref 150–400)
RBC: 4.32 MIL/uL (ref 3.87–5.11)
RDW: 13.3 % (ref 11.5–15.5)
WBC: 9.4 10*3/uL (ref 4.0–10.5)

## 2014-09-10 MED ORDER — BISOPROLOL FUMARATE 5 MG PO TABS
5.0000 mg | ORAL_TABLET | Freq: Two times a day (BID) | ORAL | Status: DC
  Administered 2014-09-10 – 2014-09-16 (×13): 5 mg via ORAL
  Filled 2014-09-10 (×14): qty 1

## 2014-09-10 MED ORDER — CETYLPYRIDINIUM CHLORIDE 0.05 % MT LIQD
7.0000 mL | Freq: Two times a day (BID) | OROMUCOSAL | Status: DC
  Administered 2014-09-10 – 2014-09-15 (×8): 7 mL via OROMUCOSAL

## 2014-09-10 MED ORDER — CETYLPYRIDINIUM CHLORIDE 0.05 % MT LIQD: 7.0000 mL | Freq: Two times a day (BID) | OROMUCOSAL | Status: DC

## 2014-09-10 MED ORDER — CHLORHEXIDINE GLUCONATE 0.12 % MT SOLN
15.0000 mL | Freq: Two times a day (BID) | OROMUCOSAL | Status: DC
  Filled 2014-09-10 (×2): qty 15

## 2014-09-10 MED ORDER — CHLORHEXIDINE GLUCONATE 0.12 % MT SOLN
15.0000 mL | Freq: Two times a day (BID) | OROMUCOSAL | Status: DC
  Administered 2014-09-10 – 2014-09-15 (×9): 15 mL via OROMUCOSAL
  Filled 2014-09-10 (×16): qty 15

## 2014-09-10 NOTE — Plan of Care (Signed)
Problem: Phase I Progression Outcomes Goal: Vital Signs stable- temperature less than 102 Outcome: Progressing Pulse in NSR Goal: Tolerating diet Outcome: Progressing Diet advanced, has been tolerating well  Problem: Phase II Progression Outcomes Goal: Vital signs remain stable, temperature < 100 Outcome: Progressing Temp WNL  Problem: Phase III Progression Outcomes Goal: Pain controlled on oral analgesia Outcome: Progressing Denies pain

## 2014-09-10 NOTE — Progress Notes (Signed)
Patient has been alert and oriented this shift. No confusion noted. Meals ordered, pt tolerating well. Fluids decreased. Pt eating and drinking well. Incontinent of times with urine. Will continue to assess.

## 2014-09-10 NOTE — Progress Notes (Signed)
Subjective: Admitted 11/20 c Sepsis d/t UTI No fever overnight. @90 % + better, better color, more interactive, less confused. She wants to go home - she clearly is not ready + cough, no sputum, CXR s PNA  Objective: Vital signs in last 24 hours: Temp:  [97.4 F (36.3 C)-101.4 F (38.6 C)] 97.4 F (36.3 C) (11/21 0529) Pulse Rate:  [79-130] 79 (11/21 0529) Resp:  [18-20] 18 (11/21 0529) BP: (120-135)/(47-77) 120/52 mmHg (11/21 0529) SpO2:  [93 %-98 %] 97 % (11/21 0529) Weight:  [129.8 kg (286 lb 2.5 oz)] 129.8 kg (286 lb 2.5 oz) (11/20 2254) Weight change:     CBG (last 3)   Recent Labs  09/10/14 0005 09/10/14 0750  GLUCAP 106* 153*    Intake/Output from previous day:  Intake/Output Summary (Last 24 hours) at 09/10/14 0815 Last data filed at 09/10/14 0532  Gross per 24 hour  Intake    450 ml  Output      0 ml  Net    450 ml   11/20 0701 - 11/21 0700 In: 450 [I.V.:450] Out: -    Physical Exam  General appearance: More alert, more oriented Eyes: no scleral icterus Throat: oropharynx moist without erythema Resp: Distant but clear Cardio: Reg, less tachy, 1/6 SEm GI: soft, non-tender; bowel sounds normal; no masses,  no organomegaly Extremities: no clubbing, cyanosis or edema   Lab Results:  Recent Labs  09/09/14 1849 09/10/14 0446  NA 138 138  K 4.1 4.2  CL 98 103  CO2 20 21  GLUCOSE 128* 156*  BUN 15 15  CREATININE 0.56 0.51  CALCIUM 9.3 8.7     Recent Labs  09/09/14 1849  AST 13  ALT 7  ALKPHOS 75  BILITOT 1.3*  PROT 6.9  ALBUMIN 2.9*     Recent Labs  09/09/14 1849 09/10/14 0446  WBC 14.1* 9.4  HGB 14.2 12.6  HCT 42.4 39.0  MCV 90.0 90.3  PLT 219 208    Lab Results  Component Value Date   INR 0.89 03/16/2013   INR 0.87 09/16/2011   INR 2.6* 05/12/2008    No results for input(s): CKTOTAL, CKMB, CKMBINDEX, TROPONINI in the last 72 hours.  No results for input(s): TSH, T4TOTAL, T3FREE, THYROIDAB in the last 72  hours.  Invalid input(s): FREET3  No results for input(s): VITAMINB12, FOLATE, FERRITIN, TIBC, IRON, RETICCTPCT in the last 72 hours.  Micro Results: No results found for this or any previous visit (from the past 240 hour(s)).   Studies/Results: Dg Chest 2 View  09/09/2014   CLINICAL DATA:  Altered mental status with weakness. History of pneumonia. Initial encounter.  EXAM: CHEST  2 VIEW  COMPARISON:  11/10/2013 radiographs; chest CT 03/30/2013.  FINDINGS: The heart size and mediastinal contours are stable. Ascending aortic stent graft and diffuse vascular calcifications are stable. There is chronic blunting of the left costophrenic angle. The left basilar aeration has improved compared with the prior study. There is no airspace disease or edema. The bones are demineralized. A mid thoracic compression deformity appears stable (T6 on prior CT). However, there is a new mild superior endplate compression deformity at T4.  IMPRESSION: Stable chronic pleural parenchymal density at the left lung base status post stent grafting. Interval T4 compression deformity.   Electronically Signed   By: 05/30/2013 M.D.   On: 09/09/2014 20:08     Medications: Scheduled: . antiseptic oral rinse  7 mL Mouth Rinse q12n4p  . bisoprolol  5 mg  Oral BID  . buPROPion  75 mg Oral Daily  . cefTRIAXone (ROCEPHIN)  IV  1 g Intravenous Q24H  . chlorhexidine  15 mL Mouth Rinse BID  . cholecalciferol  5,000 Units Oral Daily  . enoxaparin (LOVENOX) injection  30 mg Subcutaneous Q24H  . latanoprost  1 drop Both Eyes QHS  . nortriptyline  75 mg Oral QHS  . predniSONE  20 mg Oral QAC breakfast  . sodium chloride  3 mL Intravenous Q12H   Continuous: . sodium chloride 75 mL/hr at 09/09/14 2332     Assessment/Plan: Active Problems:   Failure to thrive in adult   Rheumatoid arthritis involving multiple joints   DM2 (diabetes mellitus, type 2)   Adrenal insufficiency, primary, iatrogenic   Aortic stenosis, severe    Memory deficit   Depression   UTI (lower urinary tract infection)   S/P TAVR (transcatheter aortic valve replacement)   Sepsis secondary to UTI  Sepsis d/t UTI c fever up to 101.5, AMS/Delirium, Gen Weakness, DeH - IV Abx and IVF. F/Up on Cx's  Leukocytosis secondary to illness - better this am even after increased steroids.  Sinus Tachy - Bisoprolol increased to BID and pulse already better  Iatrogenic Adrenal Insuffiency - Stress dose steroids. S/P IV solumedrol last night.  Will do 20 mg Prednisone today and for a few days then wean to home dose of 7.5 per day  RA - Prednisone  Severe symptomatic aortic stenosis - s/p 29 mm TAVR/[facility]Valve placement at [facility] Oct 2014 at [facility] - Last ECHO 03/23/14 EF 50-55%-prior TAVR-Mild central and minimal paravalvular leak. Just seen at [facility] 10/30 and was doing well - had another ECHO s report - she was told it was fine.  chronic Heart failure - Off Lasix and other meds - No Sx  Marked pulmonary fibrosis with a low diffusion capacity - use O2 while ill.  Osteoporosis c compression Fxs - Fosamax at home  Depression - continue meds but anything sedating or that can contribute to Delirium has been on hold.  Pancreatic Insuff - not on Pancreatic enzymes  DVT proph - Lovenox   DM2 - ISS - Only some increase in CBGs c the steroids. -  Recent Labs  09/10/14 0005 09/10/14 0750  GLUCAP 106* 153*   ID -  Anti-infectives    Start     Dose/Rate Route Frequency Ordered Stop   09/09/14 2145  cefTRIAXone (ROCEPHIN) 1 g in dextrose 5 % 50 mL IVPB     1 g100 mL/hr over 30 Minutes Intravenous Every 24 hours 09/09/14 2144 09/16/14 2144   09/09/14 2000  cefTRIAXone (ROCEPHIN) 1 g in dextrose 5 % 50 mL IVPB     1 g100 mL/hr over 30 Minutes Intravenous  Once 09/09/14 1954 09/09/14 2046      1-2 more days IVF/IV Abx, Ambulate and focus on function.  If continues to improve - may D/c tom night vrs Monday    LOS: 1 day   [name] M 09/10/2014, 8:15  AM

## 2014-09-11 LAB — CBC
HCT: 35 % — ABNORMAL LOW (ref 36.0–46.0)
Hemoglobin: 11.3 g/dL — ABNORMAL LOW (ref 12.0–15.0)
MCH: 28.9 pg (ref 26.0–34.0)
MCHC: 32.3 g/dL (ref 30.0–36.0)
MCV: 89.5 fL (ref 78.0–100.0)
Platelets: 221 K/uL (ref 150–400)
RBC: 3.91 MIL/uL (ref 3.87–5.11)
RDW: 13.5 % (ref 11.5–15.5)
WBC: 11 K/uL — ABNORMAL HIGH (ref 4.0–10.5)

## 2014-09-11 LAB — BASIC METABOLIC PANEL WITH GFR
Anion gap: 9 (ref 5–15)
BUN: 20 mg/dL (ref 6–23)
CO2: 26 meq/L (ref 19–32)
Calcium: 8.8 mg/dL (ref 8.4–10.5)
Chloride: 107 meq/L (ref 96–112)
Creatinine, Ser: 0.53 mg/dL (ref 0.50–1.10)
GFR calc Af Amer: >90 mL/min
GFR calc non Af Amer: 89 mL/min — ABNORMAL LOW
Glucose, Bld: 127 mg/dL — ABNORMAL HIGH (ref 70–99)
Potassium: 3.8 meq/L (ref 3.7–5.3)
Sodium: 142 meq/L (ref 137–147)

## 2014-09-11 LAB — GLUCOSE, CAPILLARY
GLUCOSE-CAPILLARY: 113 mg/dL — AB (ref 70–99)
Glucose-Capillary: 107 mg/dL — ABNORMAL HIGH (ref 70–99)
Glucose-Capillary: 143 mg/dL — ABNORMAL HIGH (ref 70–99)
Glucose-Capillary: 156 mg/dL — ABNORMAL HIGH (ref 70–99)

## 2014-09-11 MED ORDER — ENOXAPARIN SODIUM 80 MG/0.8ML ~~LOC~~ SOLN
0.5000 mg/kg | SUBCUTANEOUS | Status: DC
  Administered 2014-09-11 – 2014-09-15 (×5): 65 mg via SUBCUTANEOUS
  Filled 2014-09-11 (×6): qty 0.8

## 2014-09-11 NOTE — Evaluation (Signed)
Physical Therapy Evaluation Patient Details Name: [name] MRN: [id number] DOB: [birth year] Today's Date: 09/11/2014   History of Present Illness  79 yo female adm  09/09/14 with AMS, weakness PMHx: RA , gait instability, memory deficit, CAD  Clinical Impression  Pt will benefit from PT to address deficits below; Should be able to D/C home with HHPT if family can provide supervision/assist d/t pt cognitive status/confusion; pt states to PT this am that she is "at [employer] and [employer]", she is oriented to self and later talks about being at [facility] previously. Will continue to follow if pt does not D/C    Follow Up Recommendations Home health PT;Supervision/Assistance - 24 hour (24hr supervision d/t confusion-?pt baseline)    Equipment Recommendations  None recommended by PT    Recommendations for Other Services       Precautions / Restrictions Precautions Precautions: Fall Restrictions Weight Bearing Restrictions: No      Mobility  Bed Mobility Overal bed mobility: Needs Assistance Bed Mobility: Supine to Sit     Supine to sit: Supervision;Modified independent (Device/Increase time);HOB elevated (HOB at 40*)     General bed mobility comments: incr time, supervision for safety  Transfers Overall transfer level: Needs assistance Equipment used: Rolling walker (2 wheeled) Transfers: Sit to/from Stand Sit to Stand: Min guard;Supervision         General transfer comment: repeated sit to stand from varied ht surfaces, cues for safety and hand  placement  Ambulation/Gait Ambulation/Gait assistance: Min guard Ambulation Distance (Feet): 160 Feet (10' more no AD) Assistive device: Rolling walker (2 wheeled);None       General Gait Details: occasional cues for RW direction and safety, pt states she needs her glasses, does not "see well"  Stairs            Wheelchair Mobility    Modified Rankin (Stroke Patients Only)       Balance Overall balance  assessment: Needs assistance;History of Falls Sitting-balance support: No upper extremity supported;Feet supported Sitting balance-Leahy Scale: Good       Standing balance-Leahy Scale: Good   Single Leg Stance - Right Leg: 3 (close guarding) Single Leg Stance - Left Leg: 1 (needs UE support)         High level balance activites: Backward walking;Direction changes;Turns;Head turns               Pertinent Vitals/Pain Pain Assessment: No/denies pain    Home Living Family/patient expects to be discharged to:: Unsure Living Arrangements: Alone   Type of Home: Apartment (condo) Home Access: Stairs to enter   [employer] of Steps: 5 then 3. the first set of stairs has a rail. Home Layout: One level Home Equipment: Walker - 2 wheels;Walker - 4 wheels Additional Comments: chart states pt lives I'ly, pt reports she lives with her dtr--unsure--pt not an accurate historian, no family present    Prior Function Level of Independence: Independent with assistive device(s)         Comments: Pt states she is indepenent with RW and uses cane and rail for stairs outside apt     Hand Dominance        Extremity/Trunk Assessment   Upper Extremity Assessment: Overall WFL for tasks assessed           Lower Extremity Assessment: Overall WFL for tasks assessed         Communication   Communication: No difficulties  Cognition Arousal/Alertness: Awake/alert Behavior During Therapy: WFL for tasks assessed/performed Overall Cognitive  Status: History of cognitive impairments - at baseline                      General Comments      Exercises        Assessment/Plan    PT Assessment Patient needs continued PT services  PT Diagnosis Difficulty walking   PT Problem List Decreased balance;Decreased mobility;Decreased safety awareness  PT Treatment Interventions DME instruction;Gait training;Stair training;Functional mobility training;Therapeutic  activities;Therapeutic exercise;Balance training;Patient/family education   PT Goals (Current goals can be found in the Care Plan section) Acute Rehab PT Goals Patient Stated Goal: likes to work in the yard PT Goal Formulation: With patient Time For Goal Achievement: 09/18/14 Potential to Achieve Goals: Good    Frequency Min 3X/week   Barriers to discharge        Co-evaluation               End of Session Equipment Utilized During Treatment: Gait belt Activity Tolerance: Patient tolerated treatment well Patient left: in chair;with call bell/phone within reach;with chair alarm set           Time: 1040-1108 PT Time Calculation (min) (ACUTE ONLY): 28 min   Charges:   PT Evaluation $Initial PT Evaluation Tier I: 1 Procedure PT Treatments $Gait Training: 23-37 mins   PT G Codes:          Allizon Woznick 09/22/2014, 11:17 AM

## 2014-09-11 NOTE — Progress Notes (Signed)
Subjective: Admitted 11/20 c Sepsis d/t UTI No fever overnight. more interactive, less confused. + cough, no sputum, CXR s PNA Patient has been alert and oriented s confusion. Pt eating and drinking well. Incontinent of times with urine.  I took her FIO2 off and she did well Also has yet to be seen from PT She says she has been up c assist to BR and was a little wobbly  Objective: Vital signs in last 24 hours: Temp:  [97.4 F (36.3 C)-97.8 F (36.6 C)] 97.5 F (36.4 C) (11/22 0620) Pulse Rate:  [72-83] 79 (11/22 0620) Resp:  [2-18] 16 (11/22 0620) BP: (122-131)/(44-69) 131/46 mmHg (11/22 0620) SpO2:  [95 %-100 %] 100 % (11/22 0620) Weight:  [129.8 kg (286 lb 2.5 oz)] 129.8 kg (286 lb 2.5 oz) (11/21 1816) Weight change: 0 kg (0 lb)    CBG (last 3)   Recent Labs  09/10/14 1636 09/10/14 2124 09/11/14 0731  GLUCAP 169* 154* 107*    Intake/Output from previous day:  Intake/Output Summary (Last 24 hours) at 09/11/14 0756 Last data filed at 09/10/14 1513  Gross per 24 hour  Intake    679 ml  Output      0 ml  Net    679 ml   11/21 0701 - 11/22 0700 In: 679 [P.O.:240; I.V.:439] Out: -    Physical Exam  General appearance: More alert, more oriented Eyes: no scleral icterus Throat: oropharynx moist without erythema Resp: Distant but clear Cardio: Reg, 1/6 SEm GI: soft, non-tender; bowel sounds normal; no masses,  no organomegaly Extremities: no clubbing, cyanosis or edema   Lab Results:  Recent Labs  09/10/14 0446 09/11/14 0513  NA 138 142  K 4.2 3.8  CL 103 107  CO2 21 26  GLUCOSE 156* 127*  BUN 15 20  CREATININE 0.51 0.53  CALCIUM 8.7 8.8     Recent Labs  09/09/14 1849  AST 13  ALT 7  ALKPHOS 75  BILITOT 1.3*  PROT 6.9  ALBUMIN 2.9*     Recent Labs  09/10/14 0446 09/11/14 0513  WBC 9.4 11.0*  HGB 12.6 11.3*  HCT 39.0 35.0*  MCV 90.3 89.5  PLT 208 221    Lab Results  Component Value Date   INR 0.89 03/16/2013   INR 0.87  09/16/2011   INR 2.6* 05/12/2008    No results for input(s): CKTOTAL, CKMB, CKMBINDEX, TROPONINI in the last 72 hours.  No results for input(s): TSH, T4TOTAL, T3FREE, THYROIDAB in the last 72 hours.  Invalid input(s): FREET3  No results for input(s): VITAMINB12, FOLATE, FERRITIN, TIBC, IRON, RETICCTPCT in the last 72 hours.  Micro Results: Recent Results (from the past 240 hour(s))  Urine culture     Status: None (Preliminary result)   Collection Time: 09/09/14  6:42 PM  Result Value Ref Range Status   Specimen Description URINE, CATHETERIZED  Final   Special Requests NONE  Final   Culture  Setup Time   Final    09/09/2014 22:49 Performed at [facility] Count   Final    >=100,000 COLONIES/ML Performed at [facility]    Culture   Final    ESCHERICHIA COLI Performed at [facility]    Report Status PENDING  Incomplete     Studies/Results: Dg Chest 2 View  09/09/2014   CLINICAL DATA:  Altered mental status with weakness. History of pneumonia. Initial encounter.  EXAM: CHEST  2 VIEW  COMPARISON:  11/10/2013 radiographs; chest CT 03/30/2013.  FINDINGS: The heart size and mediastinal contours are stable. Ascending aortic stent graft and diffuse vascular calcifications are stable. There is chronic blunting of the left costophrenic angle. The left basilar aeration has improved compared with the prior study. There is no airspace disease or edema. The bones are demineralized. A mid thoracic compression deformity appears stable (T6 on prior CT). However, there is a new mild superior endplate compression deformity at T4.  IMPRESSION: Stable chronic pleural parenchymal density at the left lung base status post stent grafting. Interval T4 compression deformity.   Electronically Signed   By: Roxy Horseman M.D.   On: 09/09/2014 20:08     Medications: Scheduled: . antiseptic oral rinse  7 mL Mouth Rinse q12n4p  . bisoprolol  5 mg Oral BID  . buPROPion   75 mg Oral Daily  . cefTRIAXone (ROCEPHIN)  IV  1 g Intravenous Q24H  . chlorhexidine  15 mL Mouth Rinse BID  . cholecalciferol  5,000 Units Oral Daily  . enoxaparin (LOVENOX) injection  30 mg Subcutaneous Q24H  . latanoprost  1 drop Both Eyes QHS  . nortriptyline  75 mg Oral QHS  . predniSONE  20 mg Oral QAC breakfast  . sodium chloride  3 mL Intravenous Q12H   Continuous: . sodium chloride 40 mL/hr at 09/10/14 1604     Assessment/Plan: Active Problems:   Failure to thrive in adult   Rheumatoid arthritis involving multiple joints   DM2 (diabetes mellitus, type 2)   Adrenal insufficiency, primary, iatrogenic   Aortic stenosis, severe   Memory deficit   Depression   UTI (lower urinary tract infection)   S/P TAVR (transcatheter aortic valve replacement)   Sepsis secondary to UTI  Sepsis d/t E coli UTI c fever up to 101.5 on 11/20, AMS/Delirium, Gen Weakness, DeH - IV Abx and IVF. Await sensitivities  Leukocytosis secondary to illness - better 11/21 and worse today d/t increased steroids. At 11.0 not worried.  Sinus Tachy - Bisoprolol increased to BID and pulse better.  D/c Tele  Iatrogenic Adrenal Insuffiency - Stress dose steroids. S/P IV solumedrol last night.  Will do 20 mg Prednisone 11/21 and for a few days then wean to home dose of 7.5 per day  RA - Prednisone  Severe symptomatic aortic stenosis - s/p 29 mm TAVR/CoreValve placement at [facility] Oct 2014 at [facility] - Last ECHO 03/23/14 EF 50-55%-prior TAVR-Mild central and minimal paravalvular leak. Just seen at [facility] 10/30 and was doing well - had another ECHO s report - she was told it was fine.  chronic Heart failure - Off Lasix and other meds - No Sx  Marked pulmonary fibrosis with a low diffusion capacity - use O2 prn while ill.  Osteoporosis c compression Fxs - Fosamax at home  Depression - continue meds but anything sedating or that can contribute to Delirium has been on hold.  Pancreatic Insuff - not on Pancreatic  enzymes  DVT proph - Lovenox   DM2 - ISS - Only some increase in CBGs c the steroids. -   Recent Labs  09/10/14 1636 09/10/14 2124 09/11/14 0731  GLUCAP 169* 154* 107*   ID -  Anti-infectives    Start     Dose/Rate Route Frequency Ordered Stop   09/09/14 2145  cefTRIAXone (ROCEPHIN) 1 g in dextrose 5 % 50 mL IVPB     1 g100 mL/hr over 30 Minutes Intravenous Every 24 hours 09/09/14 2144 09/16/14 2144   09/09/14  2000  cefTRIAXone (ROCEPHIN) 1 g in dextrose 5 % 50 mL IVPB     1 g100 mL/hr over 30 Minutes Intravenous  Once 09/09/14 1954 09/09/14 2046      1 more day c IV Abx and convert to PO later or tomorrow based on Cx data. For some reason she was not seen by PT so I do not know her ambulatory status.  Will push for this to happen today. If Functional she can go home Monday?   LOS: 2 days   Floria Brandau M 09/11/2014, 7:56 AM

## 2014-09-11 NOTE — Progress Notes (Signed)
Pt with hx of ESBL. Not on contact precautions. Placed on contact precaution per protocol and ID notified. Spoke with sherrie, ID consultant who reviewed pt's chart and confirmed that pt be placed on contact precautions. Education provided to pt and pt remains on precaution at this time.

## 2014-09-12 DIAGNOSIS — G9341 Metabolic encephalopathy: Secondary | ICD-10-CM | POA: Diagnosis present

## 2014-09-12 LAB — URINE CULTURE

## 2014-09-12 LAB — GLUCOSE, CAPILLARY
GLUCOSE-CAPILLARY: 84 mg/dL (ref 70–99)
GLUCOSE-CAPILLARY: 101 mg/dL — AB (ref 70–99)
GLUCOSE-CAPILLARY: 141 mg/dL — AB (ref 70–99)
Glucose-Capillary: 169 mg/dL — ABNORMAL HIGH (ref 70–99)

## 2014-09-12 MED ORDER — ERTAPENEM SODIUM 1 G IJ SOLR
1.0000 g | INTRAMUSCULAR | Status: DC
  Administered 2014-09-12 – 2014-09-15 (×4): 1 g via INTRAVENOUS
  Filled 2014-09-12 (×4): qty 1

## 2014-09-12 NOTE — Clinical Documentation Improvement (Signed)
  Patient admitted with Sepsis due to UTI with "altered mental status" and "confusion". In the Coding world these terms are considered nonspecific and low weighted. Please clarify to better reflect severity of illness and risk of mortality.  Possible Conditions -- Altered mental status -- Toxic encephalopathy -- Metabolic encephalopathy -- Other condition  Thank Harle Battiest RN CDI [phone] HIM department

## 2014-09-12 NOTE — Progress Notes (Signed)
Subjective: Having cough and appetite is improving, mentation is clear now  Objective: Vital signs in last 24 hours: Temp:  [97.6 F (36.4 C)-98 F (36.7 C)] 97.6 F (36.4 C) (11/23 0516) Pulse Rate:  [78-86] 80 (11/23 0516) Resp:  [18-22] 22 (11/23 0516) BP: (116-126)/(56) 126/56 mmHg (11/23 0516) SpO2:  [93 %-94 %] 94 % (11/23 0516) Weight change:    Intake/Output from previous day:     General appearance: alert, cooperative, mild distress and with occasional dry cough Neck: no adenopathy, no carotid bruit, no JVD, supple, symmetrical, trachea midline and thyroid not enlarged, symmetric, no tenderness/mass/nodules Resp: clear to auscultation bilaterally Cardio: regular rate and rhythm and with a 2/6 SEM and a 2/6 diastolic murmur GI: soft, non-tender; bowel sounds normal; no masses,  no organomegaly Extremities: bilateral trace leg edema  Lab Results:  Recent Labs  09/10/14 0446 09/11/14 0513  WBC 9.4 11.0*  HGB 12.6 11.3*  HCT 39.0 35.0*  PLT 208 221   BMET  Recent Labs  09/10/14 0446 09/11/14 0513  NA 138 142  K 4.2 3.8  CL 103 107  CO2 21 26  GLUCOSE 156* 127*  BUN 15 20  CREATININE 0.51 0.53  CALCIUM 8.7 8.8   CMET CMP     Component Value Date/Time   NA 142 09/11/2014 0513   K 3.8 09/11/2014 0513   CL 107 09/11/2014 0513   CO2 26 09/11/2014 0513   GLUCOSE 127* 09/11/2014 0513   BUN 20 09/11/2014 0513   CREATININE 0.53 09/11/2014 0513   CREATININE 0.64 03/23/2014 1429   CALCIUM 8.8 09/11/2014 0513   CALCIUM 8.6 11/15/2011 0445   PROT 6.9 09/09/2014 1849   ALBUMIN 2.9* 09/09/2014 1849   AST 13 09/09/2014 1849   ALT 7 09/09/2014 1849   ALKPHOS 75 09/09/2014 1849   BILITOT 1.3* 09/09/2014 1849   GFRNONAA 89* 09/11/2014 0513   GFRAA >90 09/11/2014 0513    CBG (last 3)   Recent Labs  09/11/14 1629 09/11/14 2048 09/12/14 0734  GLUCAP 113* 156* 84    INR RESULTS:   Lab Results  Component Value Date   INR 0.89 03/16/2013   INR  0.87 09/16/2011   INR 2.6* 05/12/2008     Studies/Results: No results found.  Medications: I have reviewed the patient's current medications.  Assessment/Plan: #1 Metabolic encephalopathy: from infection and back to her baseline #2 Urosepsis: improved and she will likely be able to be discharged to home tomorrow #3 Heart murmurs: she had a TAVR and we will check an echo if not already done to assess valve  LOS: 3 days   Jayci Ellefson G 09/12/2014, 8:11 AM

## 2014-09-12 NOTE — Progress Notes (Signed)
ANTIBIOTIC CONSULT NOTE - INITIAL  Pharmacy Consult for ertapenem Indication: ESBL E. coli on urine culture  Allergies  Allergen Reactions  . Penicillins Hives    Patient Measurements: Height: 5\' 3"  (160 cm) Weight: 286 lb 2.5 oz (129.8 kg) IBW/kg (Calculated) : 52.4 Adjusted Body Weight: 83kg  Vital Signs: Temp: 98.3 F (36.8 C) (11/23 2110) Temp Source: Oral (11/23 2110) BP: 105/59 mmHg (11/23 2110) Pulse Rate: 85 (11/23 2110)  Labs:  Recent Labs  09/10/14 0446 09/11/14 0513  WBC 9.4 11.0*  HGB 12.6 11.3*  PLT 208 221  CREATININE 0.51 0.53   Estimated Creatinine Clearance: 77.5 mL/min (by C-G formula based on Cr of 0.53).   Medical History: Past Medical History  Diagnosis Date  . Osteoporosis   . Rheumatoid arthritis(714.0)   . Murmur, cardiac   . Pneumonia   . Anxiety   . Adrenal insufficiency, primary, iatrogenic   . Gait instability   . Physical deconditioning   . SOBOE (shortness of breath on exertion)     echo 02/23/13-EF 55-60%, possible bicuspid aortic valve with mod to severed stenosis   . Palpitations   . Chest pain     myoview 04/13/04-inferoapical thinning with a suggestion of mild apical ischemia, EF 66%  . Syncope   . Aortic stenosis, severe 02/26/2013  . Pancreatic insufficiency 11/09/2012  . UTI (lower urinary tract infection) 10/01/2011  . Hearing loss 03/25/2013  . Memory deficit 03/25/2013  . Depression 03/25/2013  . Pulmonary fibrosis 03/25/2013  . Coronary artery disease     Medications:  Scheduled:  . antiseptic oral rinse  7 mL Mouth Rinse q12n4p  . bisoprolol  5 mg Oral BID  . buPROPion  75 mg Oral Daily  . chlorhexidine  15 mL Mouth Rinse BID  . cholecalciferol  5,000 Units Oral Daily  . enoxaparin (LOVENOX) injection  0.5 mg/kg Subcutaneous Q24H  . ertapenem  1 g Intravenous Q24H  . latanoprost  1 drop Both Eyes QHS  . nortriptyline  75 mg Oral QHS  . predniSONE  20 mg Oral QAC breakfast  . sodium chloride  3 mL Intravenous Q12H    Assessment: 60 yoF admitted 11/20 with AMS. Pt with Hx frequent UTIs, family reports similar symptoms with previous UTIs. Pt admitted at that time on empiric ceftriaxone despite history of ESBL organisms in urine. Now with ESBL E.coli on urine culture. Pharmacy is consulted to dose ertapenem for UTI.  Antiinfectives D4 antibiotics D1 appropriate treatment 11/20 >> ceftriaxone >> 11/23 11/23 >> ertapenem >>    Labs / vitals Tmax: afebrile WBCs: 11.0 on 11/22 Renal: SCr 0.53, CrCl 77 ml/min CG  Microbiology 11/20 urine: >100k ESBL E.coli (R=FQs, Bactrim)   Goal of Therapy:  ertapenem per indication  Plan:  - discontinue ceftriaxone - start ertapenem 1g IV q24h - follow-up clinical course, culture results, renal function - follow-up antibiotic de-escalation and length of therapy  Thank you for the consult.  12/20, PharmD, BCPS Pager: [phone] Pharmacy: [phone] 09/12/2014 9:22 PM

## 2014-09-12 NOTE — Progress Notes (Signed)
Physical Therapy Treatment Patient Details Name: [name] MRN: [id number] DOB: September 25, 1935 Today's Date: 09/12/2014    History of Present Illness 79 yo female adm  09/09/14 with AMS, weakness PMHx: RA , gait instability, memory deficit, CAD    PT Comments    Pt was able to increase gait tolerance to 180 feet in the hallway this tx.  Pt requires supervision-min guard A for all mobility.  Pt requires min VCs for RW management/safety during gait.    Follow Up Recommendations  Home health PT;Supervision/Assistance - 24 hour     Equipment Recommendations  None recommended by PT    Recommendations for Other Services       Precautions / Restrictions Precautions Precautions: Fall Restrictions Weight Bearing Restrictions: No    Mobility  Bed Mobility               General bed mobility comments: pt seen OOB in recliner  Transfers Overall transfer level: Needs assistance Equipment used: Rolling walker (2 wheeled) Transfers: Sit to/from Stand Sit to Stand: Supervision         General transfer comment: VCs for hand placement  Ambulation/Gait Ambulation/Gait assistance: Min guard Ambulation Distance (Feet): 180 Feet Assistive device: Rolling walker (2 wheeled) Gait Pattern/deviations: Step-through pattern;Decreased stride length Gait velocity: decr   General Gait Details: increased time; VCs for RW management   Stairs            Wheelchair Mobility    Modified Rankin (Stroke Patients Only)       Balance                                    Cognition Arousal/Alertness: Awake/alert Behavior During Therapy: WFL for tasks assessed/performed Overall Cognitive Status: History of cognitive impairments - at baseline                      Exercises      General Comments        Pertinent Vitals/Pain Pain Assessment: No/denies pain    Home Living                      Prior Function            PT Goals  (current goals can now be found in the care plan section) Progress towards PT goals: Progressing toward goals    Frequency  Min 3X/week    PT Plan Current plan remains appropriate    Co-evaluation             End of Session Equipment Utilized During Treatment: Gait belt Activity Tolerance: Patient tolerated treatment well Patient left: in chair;with call bell/phone within reach;with chair alarm set     Time: 4235-3614 PT Time Calculation (min) (ACUTE ONLY): 23 min  Charges:                       G Codes:      Miller,Derrick, SPTA 09/12/2014, 3:52 PM  Reviewed above  Felecia Shelling  PTA [facility]  Acute  Rehab Pager      [phone]

## 2014-09-12 NOTE — Progress Notes (Signed)
CARE MANAGEMENT NOTE 09/12/2014  Patient:  [name], [name]   Account Number:  [id number]  Date Initiated:  09/12/2014  Documentation initiated by:  Ferdinand Cava  Subjective/Objective Assessment:   79 yo female admitted from home with rheumatoid arthritis flare, encephalopathy r/t urospesis,     Action/Plan:   discharge planning   Anticipated DC Date:  09/13/2014   Anticipated DC Plan:  HOME W HOME HEALTH SERVICES      DC Planning Services  CM consult      Choice offered to / List presented to:  C-1 Patient           Status of service:  In process, will continue to follow Medicare Important Message given?  YES (If response is "NO", the following Medicare IM given date fields will be blank) Date Medicare IM given:  09/12/2014 Medicare IM given by:  Ferdinand Cava Date Additional Medicare IM given:   Additional Medicare IM given by:    Discharge Disposition:  HOME W HOME HEALTH SERVICES  Per UR Regulation:    If discussed at Long Length of Stay Meetings, dates discussed:    Comments:  09/12/14 Ferdinand Cava RN BSN CM [phone] Patient stated that she lives at home alone with support from her daughetr Olegario Messier and granddaughter. She stated that her daughetr Olegario Messier will stay overnight with her sometimes. She has a walker and a cane in the home, states that she manages her own mediations and Dr. Jarold Motto has been her PCP for many years. The patient desires to return home and is agreeable to [facility] services if Dr. Jarold Motto feels that it is appropriate. She stated that she has had HH services with [facility] in the past and she would like to use them again. Received permission from patient to contact her daughter Olegario Messier, left VM message to return call. Will continue to follow.

## 2014-09-13 DIAGNOSIS — I059 Rheumatic mitral valve disease, unspecified: Secondary | ICD-10-CM

## 2014-09-13 LAB — GLUCOSE, CAPILLARY
GLUCOSE-CAPILLARY: 209 mg/dL — AB (ref 70–99)
Glucose-Capillary: 93 mg/dL (ref 70–99)
Glucose-Capillary: 148 mg/dL — ABNORMAL HIGH (ref 70–99)

## 2014-09-13 MED ORDER — NITROFURANTOIN MACROCRYSTAL 100 MG PO CAPS
100.0000 mg | ORAL_CAPSULE | Freq: Two times a day (BID) | ORAL | Status: DC
  Administered 2014-09-13 – 2014-09-14 (×4): 100 mg via ORAL
  Filled 2014-09-13 (×7): qty 1

## 2014-09-13 MED ORDER — GENTAMICIN SULFATE 40 MG/ML IJ SOLN
330.0000 mg | INTRAVENOUS | Status: DC
  Administered 2014-09-13 – 2014-09-15 (×3): 330 mg via INTRAVENOUS
  Filled 2014-09-13 (×4): qty 8.25

## 2014-09-13 NOTE — Progress Notes (Signed)
ANTIBIOTIC CONSULT NOTE - INITIAL  Pharmacy Consult for Gentamicin Indication: ESBL UTI  Allergies  Allergen Reactions  . Penicillins Hives    Patient Measurements: Height: 5\' 3"  (160 cm) Weight: 286 lb 2.5 oz (129.8 kg) IBW/kg (Calculated) : 52.4 Adjusted Body Weight: 83.35  Vital Signs: Temp: 98.2 F (36.8 C) (11/24 0700) Temp Source: Oral (11/24 0700) BP: 125/64 mmHg (11/24 0700) Pulse Rate: 84 (11/24 0700) Intake/Output from previous day: 11/23 0701 - 11/24 0700 In: 240 [P.O.:240] Out: -  Intake/Output from this shift:    Labs:  Recent Labs  09/11/14 0513  WBC 11.0*  HGB 11.3*  PLT 221  CREATININE 0.53   Estimated Creatinine Clearance: 77.5 mL/min (by C-G formula based on Cr of 0.53). No results for input(s): VANCOTROUGH, VANCOPEAK, VANCORANDOM, GENTTROUGH, GENTPEAK, GENTRANDOM, TOBRATROUGH, TOBRAPEAK, TOBRARND, AMIKACINPEAK, AMIKACINTROU, AMIKACIN in the last 72 hours.   Microbiology: Recent Results (from the past 720 hour(s))  Urine culture     Status: None   Collection Time: 09/09/14  6:42 PM  Result Value Ref Range Status   Specimen Description URINE, CATHETERIZED  Final   Special Requests NONE  Final   Culture  Setup Time   Final    09/09/2014 22:49 Performed at 09/11/2014 Count   Final    >=100,000 COLONIES/ML Performed at [facility]    Culture   Final    ESCHERICHIA COLI Note: Confirmed Extended Spectrum Beta-Lactamase Producer (ESBL) CRITICAL RESULT CALLED TO, READ BACK BY AND VERIFIED WITH: GWEN@11 :34AM ON 09/12/14 BY DANTS Performed at 09/14/14    Report Status 09/12/2014 FINAL  Final   Organism ID, Bacteria ESCHERICHIA COLI  Final      Susceptibility   Escherichia coli - MIC*    AMPICILLIN >=32 RESISTANT Resistant     CEFAZOLIN >=64 RESISTANT Resistant     CEFTRIAXONE >=64 RESISTANT Resistant     CIPROFLOXACIN >=4 RESISTANT Resistant     GENTAMICIN <=1 SENSITIVE Sensitive     LEVOFLOXACIN  >=8 RESISTANT Resistant     NITROFURANTOIN <=16 SENSITIVE Sensitive     TOBRAMYCIN <=1 SENSITIVE Sensitive     TRIMETH/SULFA >=320 RESISTANT Resistant     IMIPENEM <=0.25 SENSITIVE Sensitive     PIP/TAZO <=4 SENSITIVE Sensitive     * ESCHERICHIA COLI    Medical History: Past Medical History  Diagnosis Date  . Osteoporosis   . Rheumatoid arthritis(714.0)   . Murmur, cardiac   . Pneumonia   . Anxiety   . Adrenal insufficiency, primary, iatrogenic   . Gait instability   . Physical deconditioning   . SOBOE (shortness of breath on exertion)     echo 02/23/13-EF 55-60%, possible bicuspid aortic valve with mod to severed stenosis   . Palpitations   . Chest pain     myoview 04/13/04-inferoapical thinning with a suggestion of mild apical ischemia, EF 66%  . Syncope   . Aortic stenosis, severe 02/26/2013  . Pancreatic insufficiency 11/09/2012  . UTI (lower urinary tract infection) 10/01/2011  . Hearing loss 03/25/2013  . Memory deficit 03/25/2013  . Depression 03/25/2013  . Pulmonary fibrosis 03/25/2013  . Coronary artery disease      Assessment: 65 yoF admitted 11/20 with AMS. Pt with Hx frequent UTIs, family reports similar symptoms with previous UTIs. Pt admitted at that time on empiric ceftriaxone despite history of ESBL organisms in urine. Now with ESBL E.coli on urine culture.  Ertapenem started 11/23.  Nitrofurantoin started today,  in  addition pharmacy consulted to dose gentamicin.   Goal of Therapy:  Eradication of infection Renally dose gentamicin  Plan:   Gentamicin 330mg  IV q24h (4mg /kg ABW)  Continue ertapenem 1gm IV q24h  Follow clinical course, renal function, length of therapy  gentamcin level as needed  RPh 09/13/2014, 10:01 AM Pager [phone]

## 2014-09-13 NOTE — Progress Notes (Signed)
Echocardiogram 2D Echocardiogram has been performed.  [name] 09/13/2014, 9:13 AM

## 2014-09-13 NOTE — Progress Notes (Signed)
Subjective: Has mild nausea today and did not eat breakfast, no dyspnea  Objective: Vital signs in last 24 hours: Temp:  [98.1 F (36.7 C)-98.3 F (36.8 C)] 98.2 F (36.8 C) (11/24 0700) Pulse Rate:  [84-92] 84 (11/24 0700) Resp:  [20] 20 (11/24 0700) BP: (105-129)/(59-64) 125/64 mmHg (11/24 0700) SpO2:  [95 %-96 %] 95 % (11/24 0700) Weight change:    Intake/Output from previous day: 11/23 0701 - 11/24 0700 In: 240 [P.O.:240] Out: -    General appearance: alert, cooperative and no distress Resp: clear to auscultation bilaterally Cardio: regular rate and rhythm and with a 2/6 SEM and a 2/6 AI murmur GI: soft, non-tender; bowel sounds normal; no masses,  no organomegaly  Lab Results:  Recent Labs  09/11/14 0513  WBC 11.0*  HGB 11.3*  HCT 35.0*  PLT 221   BMET  Recent Labs  09/11/14 0513  NA 142  K 3.8  CL 107  CO2 26  GLUCOSE 127*  BUN 20  CREATININE 0.53  CALCIUM 8.8   CMET CMP     Component Value Date/Time   NA 142 09/11/2014 0513   K 3.8 09/11/2014 0513   CL 107 09/11/2014 0513   CO2 26 09/11/2014 0513   GLUCOSE 127* 09/11/2014 0513   BUN 20 09/11/2014 0513   CREATININE 0.53 09/11/2014 0513   CREATININE 0.64 03/23/2014 1429   CALCIUM 8.8 09/11/2014 0513   CALCIUM 8.6 11/15/2011 0445   PROT 6.9 09/09/2014 1849   ALBUMIN 2.9* 09/09/2014 1849   AST 13 09/09/2014 1849   ALT 7 09/09/2014 1849   ALKPHOS 75 09/09/2014 1849   BILITOT 1.3* 09/09/2014 1849   GFRNONAA 89* 09/11/2014 0513   GFRAA >90 09/11/2014 0513    CBG (last 3)   Recent Labs  09/12/14 1720 09/12/14 2102 09/13/14 0809  GLUCAP 141* 169* 93    INR RESULTS:   Lab Results  Component Value Date   INR 0.89 03/16/2013   INR 0.87 09/16/2011   INR 2.6* 05/12/2008     Studies/Results: No results found.  Medications: I have reviewed the patient's current medications.  Assessment/Plan: #1 Urosepsis: has ESBL E. Coli infection and will increase antibiotics for this,  continue IVF. She is not yet strong enough to go home. #2 Heart Murmur: will review echo results #3 Physical deconditioning: moderate and will need PT and OT upon discharge  LOS: 4 days   Lorn Butcher G 09/13/2014, 9:33 AM

## 2014-09-13 NOTE — Progress Notes (Signed)
Clinical Social Work  CSW discussed case with CM who reports that MD was considering SNF placement for patient. CSW discussed case with insurance representative who reports no skilled need at this time. CSW shared information with CM who will arrange HH at DC.  [place], [place] [phone]

## 2014-09-13 NOTE — Progress Notes (Signed)
CARE MANAGEMENT NOTE 09/13/2014  Patient:  [name], [name]   Account Number:  [id number]  Date Initiated:  09/12/2014  Documentation initiated by:  Ferdinand Cava  Subjective/Objective Assessment:   79 yo female admitted from home with rheumatoid arthritis flare, encephalopathy r/t urospesis,     Action/Plan:   discharge planning   Anticipated DC Date:  09/13/2014   Anticipated DC Plan:  HOME W HOME HEALTH SERVICES      DC Planning Services  CM consult      Choice offered to / List presented to:  C-1 Patient        HH arranged  HH-2 PT  HH-3 OT  HH-4 NURSE'S AIDE      HH agency  [facility]   Status of service:  Completed, signed off Medicare Important Message given?  YES (If response is "NO", the following Medicare IM given date fields will be blank) Date Medicare IM given:  09/12/2014 Medicare IM given by:  Ferdinand Cava Date Additional Medicare IM given:   Additional Medicare IM given by:    Discharge Disposition:  HOME W HOME HEALTH SERVICES  Per UR Regulation:    If discussed at Long Length of Stay Meetings, dates discussed:    Comments:  09/13/14 Ferdinand Cava RN BSN M [phone] Spoke with patient and patient daughter Olegario Messier and confirmed plan for patient to return home with [facility] services and daughter support. Referral communicated and confirmed with [facility] rep, Kristen for [facility] OT,PT, aide.  09/12/14 Ferdinand Cava RN BSN CM [phone] Spoke with Olegario Messier, patient's daughter, and she stated that the patient is independent with support of family. She stated that she stays with the patient at times and can stay with the patient upon discharge to make sure she is safe at home alone. She agrees with [facility] HH services if the PCP feels appropriate to order. Provided a list of private duty sitter agencies for daughter if need arises. Will continue to follow.  09/12/14 Ferdinand Cava RN BSN CM [phone] Patient stated that she lives at home alone with support  from her daughetr Olegario Messier and granddaughter. She stated that her daughetr Olegario Messier will stay overnight with her sometimes. She has a walker and a cane in the home, states that she manages her own mediations and Dr. Jarold Motto has been her PCP for many years. The patient desires to return home and is agreeable to [facility] services if Dr. Jarold Motto feels that it is appropriate. She stated that she has had HH services with [facility] in the past and she would like to use them again. Received permission from patient to contact her daughter Olegario Messier, left VM message to return call. Will continue to follow.

## 2014-09-14 LAB — GENTAMICIN LEVEL, TROUGH: Gentamicin Trough: 1.1 ug/mL (ref 0.5–2.0)

## 2014-09-14 LAB — GLUCOSE, CAPILLARY
GLUCOSE-CAPILLARY: 117 mg/dL — AB (ref 70–99)
Glucose-Capillary: 96 mg/dL (ref 70–99)
Glucose-Capillary: 182 mg/dL — ABNORMAL HIGH (ref 70–99)

## 2014-09-14 NOTE — Plan of Care (Signed)
Problem: Phase I Progression Outcomes Goal: Pain controlled with appropriate interventions Outcome: Not Applicable Date Met:  01/02/93 Goal: OOB as tolerated unless otherwise ordered Outcome: Completed/Met Date Met:  09/14/14 Goal: Voiding-avoid urinary catheter unless indicated Outcome: Completed/Met Date Met:  09/14/14 Goal: Adequate I & O Outcome: Completed/Met Date Met:  09/14/14 Goal: Tolerating diet Outcome: Completed/Met Date Met:  09/14/14

## 2014-09-14 NOTE — Progress Notes (Signed)
Physical Therapy Treatment Patient Details Name: [name] MRN: [id number] DOB: [birth year] Today's Date: 09/14/2014    History of Present Illness 79 yo female adm  09/09/14 with AMS, weakness PMHx: RA , gait instability, memory deficit, CAD    PT Comments    Performed BERG balance test pt scored 27/56 inicating need for AD and HIGH FALL RISK.  Also performed TUG three times with avg 36 sec indicating slow gait.  Pt did use her walker. Pt will need supervision for mobility.  Follow Up Recommendations  Home health PT;Supervision/Assistance - 24 hour     Equipment Recommendations  None recommended by PT    Recommendations for Other Services       Precautions / Restrictions Precautions Precautions: Fall Restrictions Weight Bearing Restrictions: No    Mobility  Bed Mobility               General bed mobility comments: pt seen OOB in recliner  Transfers Overall transfer level: Needs assistance Equipment used: Rolling walker (2 wheeled) Transfers: Sit to/from Stand Sit to Stand: Supervision;Min guard         General transfer comment: VCs for hand placement and safety esp with turn completion prior to sit and to use RW throughout turns.  Ambulation/Gait Ambulation/Gait assistance: Min guard Ambulation Distance (Feet): 65 Feet Assistive device: Rolling walker (2 wheeled) Gait Pattern/deviations: Step-through pattern;Decreased stride length;Trunk flexed Gait velocity: decreaseed   General Gait Details: increased time; VCs for RW management   Stairs            Wheelchair Mobility    Modified Rankin (Stroke Patients Only)       Balance Overall balance assessment: Needs assistance;History of Falls                                  Cognition Arousal/Alertness: Awake/alert Behavior During Therapy: WFL for tasks assessed/performed Overall Cognitive Status: History of cognitive impairments - at baseline (poor current understanding of  situation)       Memory: Decreased short-term memory              Exercises      General Comments General comments (skin integrity, edema, etc.): performed 3 times then averaged out.  Pt did use a RW.      Pertinent Vitals/Pain Pain Assessment: No/denies pain    Home Living                      Prior Function            PT Goals (current goals can now be found in the care plan section) Progress towards PT goals: Progressing toward goals    Frequency  Min 3X/week    PT Plan      Co-evaluation             End of Session Equipment Utilized During Treatment: Gait belt Activity Tolerance: Patient tolerated treatment well Patient left: in chair;with call bell/phone within reach;with chair alarm set     Time: [phone] PT Time Calculation (min) (ACUTE ONLY): 30 min  Charges:  $Gait Training: 8-22 mins $Therapeutic Activity: 8-22 mins                    G Codes:      Felecia Shelling  PTA [facility]  Acute  Rehab Pager      [phone]

## 2014-09-14 NOTE — Clinical Documentation Improvement (Signed)
  PN states several times "urosepsis". In the Coding world this equates to "simple UTI". Please clarify "urosepsis" in Notes to better reflect severity of illness and risk of mortality.  Possible Conditions -- Sepsis due to UTI -- Sepsis and UTI -- Urosepsis -- Other condition  Thank you,  Beverley Fiedler ,RN Clinical Documentation Specialist:  [phone]  [facility]- Health Information Management

## 2014-09-14 NOTE — Progress Notes (Signed)
Subjective: Still having mild nausea, but eating better, no diarrhea or constipation  Objective: Vital signs in last 24 hours: Temp:  [97.8 F (36.6 C)-98.2 F (36.8 C)] 97.8 F (36.6 C) (11/25 0600) Pulse Rate:  [74-89] 87 (11/25 0600) Resp:  [18-20] 18 (11/25 0600) BP: (108-148)/(58-82) 148/82 mmHg (11/25 0600) SpO2:  [93 %-96 %] 94 % (11/25 0600) Weight change:    Intake/Output from previous day:     General appearance: alert, cooperative and no distress Resp: clear to auscultation bilaterally Cardio: regular rate and rhythm and with a 2/6 SEM and a 2/6 AI murmur GI: soft, non-tender; bowel sounds normal; no masses,  no organomegaly Extremities: bilateral trace leg edema  Lab Results: No results for input(s): WBC, HGB, HCT, PLT in the last 72 hours. BMET No results for input(s): NA, K, CL, CO2, GLUCOSE, BUN, CREATININE, CALCIUM in the last 72 hours. CMET CMP     Component Value Date/Time   NA 142 09/11/2014 0513   K 3.8 09/11/2014 0513   CL 107 09/11/2014 0513   CO2 26 09/11/2014 0513   GLUCOSE 127* 09/11/2014 0513   BUN 20 09/11/2014 0513   CREATININE 0.53 09/11/2014 0513   CREATININE 0.64 03/23/2014 1429   CALCIUM 8.8 09/11/2014 0513   CALCIUM 8.6 11/15/2011 0445   PROT 6.9 09/09/2014 1849   ALBUMIN 2.9* 09/09/2014 1849   AST 13 09/09/2014 1849   ALT 7 09/09/2014 1849   ALKPHOS 75 09/09/2014 1849   BILITOT 1.3* 09/09/2014 1849   GFRNONAA 89* 09/11/2014 0513   GFRAA >90 09/11/2014 0513    CBG (last 3)   Recent Labs  09/13/14 1217 09/13/14 2209 09/14/14 0721  GLUCAP 148* 209* 96    INR RESULTS:   Lab Results  Component Value Date   INR 0.89 03/16/2013   INR 0.87 09/16/2011   INR 2.6* 05/12/2008     Studies/Results: No results found.  Medications: I have reviewed the patient's current medications.  Assessment/Plan: #1 Urosepsis: improved, and she is only completing 24 hours of adequate IV antibiotics for her ESBL resistant E. Coli  infection. Anticipate giving at least 72 hours of IV antibiotics prior to discharge to home on macrodantin. Insurance has refused to allow SNF transfer for IV antibiotics and treatment of physical deconditioning. #2 Adrenal insufficiency: stable on prednisone 20 mg daily  LOS: 5 days   Francenia Chimenti G 09/14/2014, 7:50 AM

## 2014-09-14 NOTE — Progress Notes (Signed)
ANTIBIOTIC CONSULT NOTE - Follow-up  Pharmacy Consult for Gentamicin/ertapenem Indication: ESBL UTI  Allergies  Allergen Reactions  . Penicillins Hives    Patient Measurements: Height: 5\' 3"  (160 cm) Weight: 286 lb 2.5 oz (129.8 kg) IBW/kg (Calculated) : 52.4 Adjusted Body Weight: 83.35  Vital Signs: Temp: 97.8 F (36.6 C) (11/25 0600) Temp Source: Oral (11/25 0600) BP: 148/82 mmHg (11/25 0600) Pulse Rate: 87 (11/25 0600) Intake/Output from previous day:   Intake/Output from this shift:    Labs: No results for input(s): WBC, HGB, PLT, LABCREA, CREATININE in the last 72 hours. Estimated Creatinine Clearance: 77.5 mL/min (by C-G formula based on Cr of 0.53).  Recent Labs  09/14/14 0930  GENTTROUGH 1.1     Microbiology: Recent Results (from the past 720 hour(s))  Urine culture     Status: None   Collection Time: 09/09/14  6:42 PM  Result Value Ref Range Status   Specimen Description URINE, CATHETERIZED  Final   Special Requests NONE  Final   Culture  Setup Time   Final    09/09/2014 22:49 Performed at 09/11/2014 Count   Final    >=100,000 COLONIES/ML Performed at [facility]    Culture   Final    ESCHERICHIA COLI Note: Confirmed Extended Spectrum Beta-Lactamase Producer (ESBL) CRITICAL RESULT CALLED TO, READ BACK BY AND VERIFIED WITH: GWEN@11 :34AM ON 09/12/14 BY DANTS Performed at 09/14/14    Report Status 09/12/2014 FINAL  Final   Organism ID, Bacteria ESCHERICHIA COLI  Final      Susceptibility   Escherichia coli - MIC*    AMPICILLIN >=32 RESISTANT Resistant     CEFAZOLIN >=64 RESISTANT Resistant     CEFTRIAXONE >=64 RESISTANT Resistant     CIPROFLOXACIN >=4 RESISTANT Resistant     GENTAMICIN <=1 SENSITIVE Sensitive     LEVOFLOXACIN >=8 RESISTANT Resistant     NITROFURANTOIN <=16 SENSITIVE Sensitive     TOBRAMYCIN <=1 SENSITIVE Sensitive     TRIMETH/SULFA >=320 RESISTANT Resistant     IMIPENEM <=0.25  SENSITIVE Sensitive     PIP/TAZO <=4 SENSITIVE Sensitive     * ESCHERICHIA COLI    Medical History: Past Medical History  Diagnosis Date  . Osteoporosis   . Rheumatoid arthritis(714.0)   . Murmur, cardiac   . Pneumonia   . Anxiety   . Adrenal insufficiency, primary, iatrogenic   . Gait instability   . Physical deconditioning   . SOBOE (shortness of breath on exertion)     echo 02/23/13-EF 55-60%, possible bicuspid aortic valve with mod to severed stenosis   . Palpitations   . Chest pain     myoview 04/13/04-inferoapical thinning with a suggestion of mild apical ischemia, EF 66%  . Syncope   . Aortic stenosis, severe 02/26/2013  . Pancreatic insufficiency 11/09/2012  . UTI (lower urinary tract infection) 10/01/2011  . Hearing loss 03/25/2013  . Memory deficit 03/25/2013  . Depression 03/25/2013  . Pulmonary fibrosis 03/25/2013  . Coronary artery disease      Assessment: 8 yoF admitted 11/20 with AMS. Pt with Hx frequent UTIs, family reports similar symptoms with previous UTIs. Pt admitted at that time on empiric ceftriaxone despite history of ESBL organisms in urine. Now with ESBL E.coli on urine culture.  Ertapenem started 11/23.  Nitrofurantoin started today,  in addition pharmacy consulted to dose gentamicin.  -Gent trough 1.1, drawn late, pt is clearing drug appropriately will continue with once daily dosing -Anticipate giving  at least 72 hours of IV abx  Goal of Therapy:  Eradication of infection Renally dose gentamicin  Plan:   Continue Gentamicin 330mg  IV q24h (4mg /kg ABW)  Continue ertapenem 1gm IV q24h  Follow clinical course, renal function, length of therapy   RPh 09/14/2014, 1:40 PM Pager [phone]

## 2014-09-15 LAB — BASIC METABOLIC PANEL
Anion gap: 11 (ref 5–15)
BUN: 10 mg/dL (ref 6–23)
CO2: 29 mEq/L (ref 19–32)
Calcium: 9.2 mg/dL (ref 8.4–10.5)
Chloride: 97 mEq/L (ref 96–112)
Creatinine, Ser: 0.64 mg/dL (ref 0.50–1.10)
GFR, EST NON AFRICAN AMERICAN: 84 mL/min — AB (ref >90)
Glucose, Bld: 93 mg/dL (ref 70–99)
POTASSIUM: 3.4 meq/L — AB (ref 3.7–5.3)
Sodium: 137 mEq/L (ref 137–147)

## 2014-09-15 LAB — GLUCOSE, CAPILLARY
GLUCOSE-CAPILLARY: 109 mg/dL — AB (ref 70–99)
Glucose-Capillary: 143 mg/dL — ABNORMAL HIGH (ref 70–99)
Glucose-Capillary: 191 mg/dL — ABNORMAL HIGH (ref 70–99)

## 2014-09-15 LAB — CREATININE, SERUM
Creatinine, Ser: 0.65 mg/dL (ref 0.50–1.10)
GFR calc non Af Amer: 83 mL/min — ABNORMAL LOW (ref >90)

## 2014-09-15 LAB — CBC
HCT: 38.8 % (ref 36.0–46.0)
Hemoglobin: 12.4 g/dL (ref 12.0–15.0)
MCH: 29.2 pg (ref 26.0–34.0)
MCHC: 32 g/dL (ref 30.0–36.0)
MCV: 91.5 fL (ref 78.0–100.0)
PLATELETS: 253 10*3/uL (ref 150–400)
RBC: 4.24 MIL/uL (ref 3.87–5.11)
RDW: 13.7 % (ref 11.5–15.5)
WBC: 10.3 10*3/uL (ref 4.0–10.5)

## 2014-09-15 NOTE — Progress Notes (Signed)
Subjective: She reports that she is feeling better though still weak.  Episode of diarrhea this am.  Breathing is stable.  Objective: Vital signs in last 24 hours: Temp:  [97.2 F (36.2 C)-98.6 F (37 C)] 97.2 F (36.2 C) (11/26 0538) Pulse Rate:  [61-139] 120 (11/26 0555) Resp:  [18-20] 18 (11/26 0538) BP: (111-128)/(58-92) 128/73 mmHg (11/26 0555) SpO2:  [92 %-100 %] 96 % (11/26 0555) Weight change:  Last BM Date: 09/14/14  CBG (last 3)   Recent Labs  09/14/14 1108 09/14/14 1623 09/15/14 0742  GLUCAP 117* 182* 109*    Intake/Output from previous day:   Intake/Output this shift:    General appearance: chronically ill, frail Eyes: no scleral icterus Throat: oropharynx moist without erythema Resp: minimal basilar crackles Cardio: regular rate and rhythm GI: soft, non-tender; bowel sounds normal; no masses,  no organomegaly Extremities: no clubbing, cyanosis or edema   Lab Results:  Recent Labs  09/15/14 0630  CREATININE 0.65   No results for input(s): AST, ALT, ALKPHOS, BILITOT, PROT, ALBUMIN in the last 72 hours. No results for input(s): WBC, NEUTROABS, HGB, HCT, MCV, PLT in the last 72 hours. Lab Results  Component Value Date   INR 0.89 03/16/2013   INR 0.87 09/16/2011   INR 2.6* 05/12/2008   No results for input(s): CKTOTAL, CKMB, CKMBINDEX, TROPONINI in the last 72 hours. No results for input(s): TSH, T4TOTAL, T3FREE, THYROIDAB in the last 72 hours.  Invalid input(s): FREET3 No results for input(s): VITAMINB12, FOLATE, FERRITIN, TIBC, IRON, RETICCTPCT in the last 72 hours.  Studies/Results: No results found.   Medications: Scheduled: . antiseptic oral rinse  7 mL Mouth Rinse q12n4p  . bisoprolol  5 mg Oral BID  . buPROPion  75 mg Oral Daily  . chlorhexidine  15 mL Mouth Rinse BID  . cholecalciferol  5,000 Units Oral Daily  . enoxaparin (LOVENOX) injection  0.5 mg/kg Subcutaneous Q24H  . ertapenem  1 g Intravenous Q24H  . gentamicin  330 mg  Intravenous Q24H  . latanoprost  1 drop Both Eyes QHS  . nitrofurantoin  100 mg Oral Q12H  . nortriptyline  75 mg Oral QHS  . predniSONE  20 mg Oral QAC breakfast  . sodium chloride  3 mL Intravenous Q12H   Continuous:   Assessment/Plan: 1. Sepsis secondary to ESBL E.coli UTI- continue IV antibiotics (Ertapenem and Gentamicin).  Will hold Nitrofurantoin until discharge to reduce risk of adverse effects of multiple antibiotics.  Obtain CBC, BMET today to monitor therapy. 2. Failure to thrive in adult- continue PT/OT.  Will need home health PT/OT and 24 hour assistance 3. Metabolic encephalopathy- improved 4. Rheumatoid arthritis involving multiple joints with Adrenal insufficiency, primary, iatrogenic- continue stress dosed Prednisone (home dose 7.5mg ) 5. Aortic stenosis, severe  S/P TAVR (transcatheter aortic valve replacement)- no significant change noted on Echocardiogram.  EF 45-50% 6. DM2 (diabetes mellitus, type 2)- sugars have been acceptable. 7. Pulmonary Fibrosis- continue O2 as needed. 8. Diarrhea- check C.diff. 9. Disposition- anticipate discharge to home tomorrow if stable.   LOS: 6 days   Martha Clan 09/15/2014, 8:57 AM

## 2014-09-15 NOTE — Progress Notes (Deleted)
Events of past 24 hours noted- patient had rapid decline with worsening hypoxic respiratory failure, hypotension, hypothermia consistent with sepsis and ARDS. Patient transferred to ICU and intubated last evening. Troponin increased consistent with supply demand ischemia/NSTEMI. Currenly intubated and sedated with family at bedside. I had an extensive conversation with husband, daughters and son-in-law updating them on her condition and prognosis. She has a living will that indicates that life sustaining measures can be withheld. I dissussed this with the family- will continue aggressive treatment for now but pending further discussions with CCM and progress over the next 24 hours withdrawal and comfort care would be appropriate. They understand and are appreciative of care. Priest is being called.  Appreciate CCM management of this patient. We will follow for family support but defer all ongoing treatment to CCM. Can re-assume care if comfort care is desired or if she stabilizes enough to move out of ICU. 

## 2014-09-16 LAB — GLUCOSE, CAPILLARY
Glucose-Capillary: 178 mg/dL — ABNORMAL HIGH (ref 70–99)
Glucose-Capillary: 104 mg/dL — ABNORMAL HIGH (ref 70–99)

## 2014-09-16 MED ORDER — SACCHAROMYCES BOULARDII 250 MG PO CAPS: 250.0000 mg | ORAL_CAPSULE | Freq: Two times a day (BID) | ORAL | Status: DC

## 2014-09-16 MED ORDER — NITROFURANTOIN MONOHYD MACRO 100 MG PO CAPS: 100.0000 mg | ORAL_CAPSULE | Freq: Two times a day (BID) | ORAL | Status: DC

## 2014-09-16 MED ORDER — PREDNISONE 20 MG PO TABS: 20.0000 mg | ORAL_TABLET | Freq: Every day | ORAL | Status: DC

## 2014-09-16 NOTE — Discharge Summary (Signed)
Physician Discharge Summary  Patient ID: [name] MRN: [id number] DOB/AGE: December 19, 1934 79 y.o.  Admit date: 09/09/2014 Discharge date: 09/16/2014   Discharge Diagnoses:  Principal Problem:   Sepsis secondary to UTI Active Problems:   Metabolic encephalopathy   Failure to thrive in adult   DM2 (diabetes mellitus, type 2)   Adrenal insufficiency, primary, iatrogenic   Rheumatoid arthritis involving multiple joints   Aortic stenosis, severe   Memory deficit   Depression   UTI (lower urinary tract infection)   S/P TAVR (transcatheter aortic valve replacement)   Discharged Condition: fair  Hospital Course:  The patitent is a 46 woman with RA on chronic Prednisone, Severe AS s/p TAVR Oct 2014 at [facility], chronic Heart failure, DM2 and many other med issues. She saw [facility] in Oct and Dr Eloise Harman in Sept. She lives independently. Daughter came and checked on her today and she had sig AMS c/w prior UTIs. She was brought to ED c AMS, Gen Weakness, confusion, DeH, 101.5, WBC 14, rest of labs fine, Sinus Tachy 130's, and Dxed c UTI. I was called for inpt admission. Last ECHO 03/23/14 EF 50-55%-prior TAVR-Mild central and minimal perivalvular leak. Rocephin started. U Cx sent. IVF started. On exam she is conversant but confused - daughter says she is much better than presentation. She was admitted for further evaluation.   She was started on rocephin IV with IVF. Her condition improved, however the urine culture grew out E. Coli that was ESBL producer with resistance to rocephin, so meds were changed to merepenum, gentamicin, and macrobid. Her condition improved and she was able to walk with a walker with PT. Her insurance company declined a transfer to a SNF to complete a full course of IV antibiotics and do PT/OT for physical deconditioning and gait rehabilitation. After several days of IV antibiotics she remained stable and is to be discharged to home on macrobid. She has had mild nausea  with a few loose stools, but has not had recurrent fever and has continued to eat some food. She will receive home health PT and care from her daughter upon discharge to home. Procedures included a transthoracic echocardiogram that showed mild AI from AV perivalvular leak and no significant interval changes from the last echo done in June 2015. There were no complications associated with her hospitalization.   Consults: None  Significant Diagnostic Studies:  No results found.  Labs: Lab Results  Component Value Date   WBC 10.3 09/15/2014   HGB 12.4 09/15/2014   HCT 38.8 09/15/2014   MCV 91.5 09/15/2014   PLT 253 09/15/2014     Recent Labs Lab 09/09/14 1849  09/15/14 0630  NA 138  < > 137  K 4.1  < > 3.4*  CL 98  < > 97  CO2 20  < > 29  BUN 15  < > 10  CREATININE 0.56  < > 0.64  0.65  CALCIUM 9.3  < > 9.2  PROT 6.9  --   --   BILITOT 1.3*  --   --   ALKPHOS 75  --   --   ALT 7  --   --   AST 13  --   --   GLUCOSE 128*  < > 93  < > = values in this interval not displayed.     Lab Results  Component Value Date   INR 0.89 03/16/2013   INR 0.87 09/16/2011   INR 2.6* 05/12/2008     Recent  Results (from the past 240 hour(s))  Urine culture     Status: None   Collection Time: 09/09/14  6:42 PM  Result Value Ref Range Status   Specimen Description URINE, CATHETERIZED  Final   Special Requests NONE  Final   Culture  Setup Time   Final    09/09/2014 22:49 Performed at [facility]   Final    >=100,000 COLONIES/ML Performed at [facility]    Culture   Final    ESCHERICHIA COLI Note: Confirmed Extended Spectrum Beta-Lactamase Producer (ESBL) CRITICAL RESULT CALLED TO, READ BACK BY AND VERIFIED WITH: GWEN@11 :34AM ON 09/12/14 BY DANTS Performed at [facility]    Report Status 09/12/2014 FINAL  Final   Organism ID, Bacteria ESCHERICHIA COLI  Final      Susceptibility   Escherichia coli - MIC*    AMPICILLIN >=32 RESISTANT  Resistant     CEFAZOLIN >=64 RESISTANT Resistant     CEFTRIAXONE >=64 RESISTANT Resistant     CIPROFLOXACIN >=4 RESISTANT Resistant     GENTAMICIN <=1 SENSITIVE Sensitive     LEVOFLOXACIN >=8 RESISTANT Resistant     NITROFURANTOIN <=16 SENSITIVE Sensitive     TOBRAMYCIN <=1 SENSITIVE Sensitive     TRIMETH/SULFA >=320 RESISTANT Resistant     IMIPENEM <=0.25 SENSITIVE Sensitive     PIP/TAZO <=4 SENSITIVE Sensitive     * ESCHERICHIA COLI      Discharge Exam: Blood pressure 139/50, pulse 81, temperature 98.1 F (36.7 C), temperature source Oral, resp. rate 18, height 5\' 3"  (1.6 m), weight 129.8 kg (286 lb 2.5 oz), SpO2 95 %.  Physical Exam: In general, she is a frail elderly white woman who was in no apparent distress while lying partially upright in bed. HEENT exam was normal, neck was without JVD or bruit, chest was clear, heart had a regular rate and rhythm with a 2/6 SEM and a 2/6 AI murmur, abdomen had normal bowel sounds and no tenderness, she had typical joint changes of RA and bilateral 1+ leg edema, she was alert and well oriented with no focal neurologic deficits.  Disposition:  She will be discharged to home today with her daughter. We will lower prednisone dosing at her planned office followup visit.   Discharge Instructions    Call MD for:    Complete by:  As directed   Call for fever, chills, severe diarrhea, difficulty breathing or any other concerning symptoms.     Diet - low sodium heart healthy    Complete by:  As directed      Discharge instructions    Complete by:  As directed   Resume a normal diet. Start the probiotic, florastor twice daily to help normalize bowel movements. Call our office today and leave message with DJ to set up a followup visit next week.     Increase activity slowly    Complete by:  As directed   Use a walker or cane at all times to prevent a fall with injury            Medication List    TAKE these medications        ALPRAZolam 1 MG  tablet  Commonly known as:  XANAX  Take 1 tablet (1 mg total) by mouth 3 (three) times daily as needed for anxiety.     bisoprolol 5 MG tablet  Commonly known as:  ZEBETA  Take 1 tablet (5 mg total) by mouth daily.  buPROPion 75 MG tablet  Commonly known as:  WELLBUTRIN  Take 75 mg by mouth every morning.     furosemide 20 MG tablet  Commonly known as:  LASIX  Take 1 tablet (20 mg total) by mouth daily.     HYDROcodone-acetaminophen 5-325 MG per tablet  Commonly known as:  NORCO/VICODIN  Take 1 tablet by mouth at bedtime as needed for moderate pain.     mirtazapine 30 MG tablet  Commonly known as:  REMERON  Take 30 mg by mouth at bedtime.     nitrofurantoin (macrocrystal-monohydrate) 100 MG capsule  Commonly known as:  MACROBID  Take 1 capsule (100 mg total) by mouth 2 (two) times daily.     nortriptyline 75 MG capsule  Commonly known as:  PAMELOR  Take 75 mg by mouth at bedtime.     predniSONE 20 MG tablet  Commonly known as:  DELTASONE  Take 1 tablet (20 mg total) by mouth daily before breakfast.     saccharomyces boulardii 250 MG capsule  Commonly known as:  FLORASTOR  Take 1 capsule (250 mg total) by mouth 2 (two) times daily.     travoprost (benzalkonium) 0.004 % ophthalmic solution  Commonly known as:  TRAVATAN  Place 1 drop into both eyes at bedtime.     Vitamin D3 5000 UNITS Caps  Take 5,000 Units by mouth daily.     VITAMIN K2 PO  Take 1 tablet by mouth daily.           Follow-up Information    Follow up with [name], MD. Schedule an appointment as soon as possible for a visit in 1 week.   Specialty:  Internal Medicine   Contact information:   [address] [place] [address] [phone]       Signed: [name] 09/16/2014, 8:32 AM

## 2014-09-16 NOTE — Progress Notes (Signed)
Patient discharge home with daughter, alert and oriented, discharge instructions given, daughter listen for patient and verbalize understanding of instructions given, My Chart access declined at this time, patient in stable condition at this time

## 2014-09-21 ENCOUNTER — Encounter: Payer: Self-pay | Admitting: Internal Medicine

## 2014-09-21 ENCOUNTER — Ambulatory Visit (INDEPENDENT_AMBULATORY_CARE_PROVIDER_SITE_OTHER): Payer: Medicare Other | Admitting: Internal Medicine

## 2014-09-21 VITALS — BP 130/70 | HR 77 | Ht 63.0 in | Wt 142.5 lb

## 2014-09-21 DIAGNOSIS — R531 Weakness: Secondary | ICD-10-CM

## 2014-09-21 DIAGNOSIS — Z954 Presence of other heart-valve replacement: Secondary | ICD-10-CM

## 2014-09-21 DIAGNOSIS — Z952 Presence of prosthetic heart valve: Secondary | ICD-10-CM

## 2014-09-21 NOTE — Patient Instructions (Signed)
Your physician wants you to follow-up in: 1 Year You will receive a reminder letter in the mail two months in advance. If you don't receive a letter, please call our office to schedule the follow-up appointment.  Your physician has requested that you have an echocardiogram. Echocardiography is a painless test that uses sound waves to create images of your heart. It provides your doctor with information about the size and shape of your heart and how well your heart's chambers and valves are working. This procedure takes approximately one hour. There are no restrictions for this procedure. 1 Year    
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
## 2014-09-29 ENCOUNTER — Encounter (HOSPITAL_COMMUNITY): Payer: Self-pay | Admitting: Internal Medicine

## 2014-11-08 ENCOUNTER — Telehealth: Payer: Self-pay | Admitting: Internal Medicine

## 2014-11-08 MED ORDER — BISOPROLOL FUMARATE 5 MG PO TABS: 5.0000 mg | ORAL_TABLET | Freq: Every day | ORAL | Status: DC

## 2014-11-08 NOTE — Telephone Encounter (Signed)
[name] is calling because she has a question about her mother medication (Bisoprolol Fumarate) wants to know if she is supposed to get this medication refilled , not sure how ling she is to be on this .Marland Kitchen Please call  Thanks

## 2014-11-08 NOTE — Telephone Encounter (Signed)
Refill submitted to patient's preferred pharmacy. Informed caller. Voiced understanding, no other stated concerns at this time.

## 2015-03-17 ENCOUNTER — Observation Stay (HOSPITAL_COMMUNITY)
Admission: EM | Admit: 2015-03-17 | Discharge: 2015-03-22 | Disposition: A | Discharge status: Home / Self Care | Payer: Medicare Other | Attending: Internal Medicine | Admitting: Internal Medicine

## 2015-03-17 ENCOUNTER — Emergency Department (HOSPITAL_COMMUNITY): Payer: Medicare Other

## 2015-03-17 ENCOUNTER — Encounter (HOSPITAL_COMMUNITY): Payer: Self-pay | Admitting: Emergency Medicine

## 2015-03-17 DIAGNOSIS — R4701 Aphasia: Secondary | ICD-10-CM

## 2015-03-17 DIAGNOSIS — Z794 Long term (current) use of insulin: Secondary | ICD-10-CM | POA: Insufficient documentation

## 2015-03-17 DIAGNOSIS — E538 Deficiency of other specified B group vitamins: Secondary | ICD-10-CM | POA: Insufficient documentation

## 2015-03-17 DIAGNOSIS — I5042 Chronic combined systolic (congestive) and diastolic (congestive) heart failure: Secondary | ICD-10-CM | POA: Diagnosis not present

## 2015-03-17 DIAGNOSIS — J449 Chronic obstructive pulmonary disease, unspecified: Secondary | ICD-10-CM | POA: Diagnosis not present

## 2015-03-17 DIAGNOSIS — N390 Urinary tract infection, site not specified: Secondary | ICD-10-CM | POA: Insufficient documentation

## 2015-03-17 DIAGNOSIS — B9620 Unspecified Escherichia coli [E. coli] as the cause of diseases classified elsewhere: Secondary | ICD-10-CM | POA: Diagnosis present

## 2015-03-17 DIAGNOSIS — M810 Age-related osteoporosis without current pathological fracture: Secondary | ICD-10-CM | POA: Diagnosis not present

## 2015-03-17 DIAGNOSIS — F419 Anxiety disorder, unspecified: Secondary | ICD-10-CM | POA: Insufficient documentation

## 2015-03-17 DIAGNOSIS — Z90710 Acquired absence of both cervix and uterus: Secondary | ICD-10-CM | POA: Diagnosis not present

## 2015-03-17 DIAGNOSIS — E119 Type 2 diabetes mellitus without complications: Secondary | ICD-10-CM | POA: Diagnosis not present

## 2015-03-17 DIAGNOSIS — F329 Major depressive disorder, single episode, unspecified: Secondary | ICD-10-CM | POA: Insufficient documentation

## 2015-03-17 DIAGNOSIS — R109 Unspecified abdominal pain: Secondary | ICD-10-CM

## 2015-03-17 DIAGNOSIS — Z880 Allergy status to penicillin: Secondary | ICD-10-CM | POA: Insufficient documentation

## 2015-03-17 DIAGNOSIS — E440 Moderate protein-calorie malnutrition: Secondary | ICD-10-CM | POA: Diagnosis not present

## 2015-03-17 DIAGNOSIS — I2510 Atherosclerotic heart disease of native coronary artery without angina pectoris: Secondary | ICD-10-CM | POA: Insufficient documentation

## 2015-03-17 DIAGNOSIS — M069 Rheumatoid arthritis, unspecified: Secondary | ICD-10-CM | POA: Insufficient documentation

## 2015-03-17 DIAGNOSIS — J8410 Pulmonary fibrosis, unspecified: Secondary | ICD-10-CM | POA: Diagnosis not present

## 2015-03-17 DIAGNOSIS — E876 Hypokalemia: Secondary | ICD-10-CM | POA: Diagnosis not present

## 2015-03-17 DIAGNOSIS — J90 Pleural effusion, not elsewhere classified: Secondary | ICD-10-CM | POA: Diagnosis not present

## 2015-03-17 DIAGNOSIS — I10 Essential (primary) hypertension: Secondary | ICD-10-CM | POA: Diagnosis not present

## 2015-03-17 DIAGNOSIS — R410 Disorientation, unspecified: Secondary | ICD-10-CM | POA: Diagnosis present

## 2015-03-17 DIAGNOSIS — G9340 Encephalopathy, unspecified: Secondary | ICD-10-CM | POA: Diagnosis not present

## 2015-03-17 DIAGNOSIS — Z800 Family history of malignant neoplasm of digestive organs: Secondary | ICD-10-CM | POA: Diagnosis not present

## 2015-03-17 DIAGNOSIS — R4182 Altered mental status, unspecified: Secondary | ICD-10-CM | POA: Diagnosis present

## 2015-03-17 LAB — DIFFERENTIAL
BASOS ABS: 0 10*3/uL (ref 0.0–0.1)
BASOS PCT: 0 % (ref 0–1)
EOS ABS: 0.1 10*3/uL (ref 0.0–0.7)
Eosinophils Relative: 1 % (ref 0–5)
LYMPHS ABS: 1.3 10*3/uL (ref 0.7–4.0)
Lymphocytes Relative: 14 % (ref 12–46)
MONO ABS: 0.6 10*3/uL (ref 0.1–1.0)
MONOS PCT: 7 % (ref 3–12)
NEUTROS ABS: 7.1 10*3/uL (ref 1.7–7.7)
Neutrophils Relative %: 78 % — ABNORMAL HIGH (ref 43–77)

## 2015-03-17 LAB — URINALYSIS, ROUTINE W REFLEX MICROSCOPIC
Bilirubin Urine: NEGATIVE
Glucose, UA: NEGATIVE mg/dL
HGB URINE DIPSTICK: NEGATIVE
KETONES UR: 40 mg/dL — AB
Nitrite: NEGATIVE
PH: 7.5 (ref 5.0–8.0)
Protein, ur: NEGATIVE mg/dL
Specific Gravity, Urine: 1.013 (ref 1.005–1.030)
Urobilinogen, UA: 1 mg/dL (ref 0.0–1.0)

## 2015-03-17 LAB — URINE MICROSCOPIC-ADD ON

## 2015-03-17 LAB — COMPREHENSIVE METABOLIC PANEL
ALK PHOS: 73 U/L (ref 38–126)
ALT: 11 U/L — ABNORMAL LOW (ref 14–54)
AST: 15 U/L (ref 15–41)
Albumin: 3.3 g/dL — ABNORMAL LOW (ref 3.5–5.0)
Anion gap: 15 (ref 5–15)
BUN: 12 mg/dL (ref 6–20)
CO2: 25 mmol/L (ref 22–32)
Calcium: 9 mg/dL (ref 8.9–10.3)
Chloride: 101 mmol/L (ref 101–111)
Creatinine, Ser: 0.76 mg/dL (ref 0.44–1.00)
GLUCOSE: 108 mg/dL — AB (ref 65–99)
POTASSIUM: 3.6 mmol/L (ref 3.5–5.1)
Sodium: 141 mmol/L (ref 135–145)
Total Bilirubin: 1 mg/dL (ref 0.3–1.2)
Total Protein: 6.1 g/dL — ABNORMAL LOW (ref 6.5–8.1)

## 2015-03-17 LAB — CBC
HEMATOCRIT: 41.6 % (ref 36.0–46.0)
Hemoglobin: 13.8 g/dL (ref 12.0–15.0)
MCH: 29.9 pg (ref 26.0–34.0)
MCHC: 33.2 g/dL (ref 30.0–36.0)
MCV: 90 fL (ref 78.0–100.0)
PLATELETS: 212 10*3/uL (ref 150–400)
RBC: 4.62 MIL/uL (ref 3.87–5.11)
RDW: 13.6 % (ref 11.5–15.5)
WBC: 9.1 10*3/uL (ref 4.0–10.5)

## 2015-03-17 LAB — I-STAT TROPONIN, ED: Troponin i, poc: 0 ng/mL (ref 0.00–0.08)

## 2015-03-17 LAB — APTT: APTT: 28 s (ref 24–37)

## 2015-03-17 LAB — PROTIME-INR
INR: 1.14 (ref 0.00–1.49)
Prothrombin Time: 14.8 seconds (ref 11.6–15.2)

## 2015-03-17 MED ORDER — GUAIFENESIN ER 600 MG PO TB12
600.0000 mg | ORAL_TABLET | Freq: Two times a day (BID) | ORAL | Status: DC
  Administered 2015-03-18 – 2015-03-22 (×9): 600 mg via ORAL
  Filled 2015-03-17 (×11): qty 1

## 2015-03-17 MED ORDER — BUPROPION HCL 75 MG PO TABS
75.0000 mg | ORAL_TABLET | Freq: Every day | ORAL | Status: DC
  Administered 2015-03-18 – 2015-03-21 (×4): 75 mg via ORAL
  Filled 2015-03-17 (×5): qty 1

## 2015-03-17 MED ORDER — HYDROCODONE-ACETAMINOPHEN 5-325 MG PO TABS: 1.0000 | ORAL_TABLET | ORAL | Status: DC | PRN

## 2015-03-17 MED ORDER — GUAIFENESIN-DM 100-10 MG/5ML PO SYRP
5.0000 mL | ORAL_SOLUTION | ORAL | Status: DC | PRN
  Filled 2015-03-17: qty 5

## 2015-03-17 MED ORDER — ENOXAPARIN SODIUM 40 MG/0.4ML ~~LOC~~ SOLN
40.0000 mg | Freq: Every day | SUBCUTANEOUS | Status: DC
  Administered 2015-03-18 – 2015-03-22 (×5): 40 mg via SUBCUTANEOUS
  Filled 2015-03-17 (×5): qty 0.4

## 2015-03-17 MED ORDER — ONDANSETRON HCL 4 MG/2ML IJ SOLN
4.0000 mg | Freq: Four times a day (QID) | INTRAMUSCULAR | Status: DC | PRN
  Administered 2015-03-20: 4 mg via INTRAVENOUS
  Filled 2015-03-17: qty 2

## 2015-03-17 MED ORDER — ALPRAZOLAM 0.5 MG PO TABS
1.0000 mg | ORAL_TABLET | Freq: Three times a day (TID) | ORAL | Status: DC | PRN
  Administered 2015-03-20: 1 mg via ORAL
  Filled 2015-03-17: qty 2

## 2015-03-17 MED ORDER — ONDANSETRON HCL 4 MG PO TABS: 4.0000 mg | ORAL_TABLET | Freq: Four times a day (QID) | ORAL | Status: DC | PRN

## 2015-03-17 MED ORDER — ALBUTEROL SULFATE (2.5 MG/3ML) 0.083% IN NEBU: 2.5000 mg | INHALATION_SOLUTION | RESPIRATORY_TRACT | Status: DC | PRN

## 2015-03-17 MED ORDER — MIRTAZAPINE 30 MG PO TABS
30.0000 mg | ORAL_TABLET | Freq: Every day | ORAL | Status: DC
  Administered 2015-03-18 – 2015-03-20 (×3): 30 mg via ORAL
  Filled 2015-03-17 (×5): qty 1

## 2015-03-17 MED ORDER — ALBUTEROL SULFATE (2.5 MG/3ML) 0.083% IN NEBU
2.5000 mg | INHALATION_SOLUTION | Freq: Four times a day (QID) | RESPIRATORY_TRACT | Status: DC
  Administered 2015-03-18: 2.5 mg via RESPIRATORY_TRACT
  Filled 2015-03-17: qty 3

## 2015-03-17 MED ORDER — DEXTROSE 5 % IV SOLN
1.0000 g | Freq: Once | INTRAVENOUS | Status: AC
  Administered 2015-03-17: 1 g via INTRAVENOUS
  Filled 2015-03-17: qty 10

## 2015-03-17 MED ORDER — LEVOFLOXACIN IN D5W 500 MG/100ML IV SOLN
500.0000 mg | INTRAVENOUS | Status: DC
  Administered 2015-03-17: 500 mg via INTRAVENOUS
  Filled 2015-03-17 (×2): qty 100

## 2015-03-17 MED ORDER — TRAVOPROST (BAK FREE) 0.004 % OP SOLN
1.0000 [drp] | Freq: Every day | OPHTHALMIC | Status: DC
  Administered 2015-03-18 – 2015-03-21 (×4): 1 [drp] via OPHTHALMIC
  Filled 2015-03-17: qty 2.5

## 2015-03-17 MED ORDER — BISOPROLOL FUMARATE 5 MG PO TABS
5.0000 mg | ORAL_TABLET | Freq: Every day | ORAL | Status: DC
  Administered 2015-03-19 – 2015-03-22 (×4): 5 mg via ORAL
  Filled 2015-03-17 (×5): qty 1

## 2015-03-17 NOTE — ED Notes (Signed)
Pt given [place] sandwich and drink and encouraged to eat and drink.

## 2015-03-17 NOTE — ED Provider Notes (Signed)
CSN: [id number]     Arrival date & time 03/17/15  1857 History   First MD Initiated Contact with Patient 03/17/15 1900     Chief Complaint  Patient presents with  . Aphasia     The history is provided by the patient and the EMS personnel. No language interpreter was used.   [name] presents for evaluation of the patient. She states that around 11 AM she developed about 15 minutes of difficulty speaking. She was feeling well before that. She went on to develop nausea and for abdominal discomfort. She denies any fevers, chest pain, shortness of breath, coughing, diarrhea, vomiting. She reports intermittent dysuria. She has chronic lower extremity edema. She has no history of CVA or TIA. She takes no blood thinners. She has a history of cardiac surgery at [facility]. Symptoms are moderate, waxing and waning, resolved.  Past Medical History  Diagnosis Date  . Osteoporosis   . Rheumatoid arthritis(714.0)   . Murmur, cardiac   . Pneumonia   . Anxiety   . Adrenal insufficiency, primary, iatrogenic   . Gait instability   . Physical deconditioning   . SOBOE (shortness of breath on exertion)     echo 02/23/13-EF 55-60%, possible bicuspid aortic valve with mod to severed stenosis   . Palpitations   . Chest pain     myoview 04/13/04-inferoapical thinning with a suggestion of mild apical ischemia, EF 66%  . Syncope   . Aortic stenosis, severe 02/26/2013  . Pancreatic insufficiency 11/09/2012  . UTI (lower urinary tract infection) 10/01/2011  . Hearing loss 03/25/2013  . Memory deficit 03/25/2013  . Depression 03/25/2013  . Pulmonary fibrosis 03/25/2013  . Coronary artery disease    Past Surgical History  Procedure Laterality Date  . Knee arthroscopy      2 on left  . Cholecystectomy    . Thyroidectomy    . Abdominal hysterectomy    . Cardiac catheterization  11/08/96    no evidence of CAD, nl LV function  . Shoulder surgery Left   . Right heart catheterization  03/22/2013    Procedure: RIGHT HEART  CATH;  Surgeon: [name], MD;  Location: [facility] CATH LAB;  Service: Cardiovascular;;  . Coronary angiogram  03/22/2013    Procedure: CORONARY ANGIOGRAM;  Surgeon: [name], MD;  Location: [facility] CATH LAB;  Service: Cardiovascular;;   Family History  Problem Relation Age of Onset  . Stroke Brother   . Cancer - Colon Father   . Cancer Sister     spine?   History  Substance Use Topics  . Smoking status: Never Smoker   . Smokeless tobacco: Never Used  . Alcohol Use: No   OB History    No data available     Review of Systems  All other systems reviewed and are negative.     Allergies  Penicillins  Home Medications   Prior to Admission medications   Medication Sig Start Date End Date Taking? Authorizing Provider  ALPRAZolam Prudy Feeler) 1 MG tablet Take 1 tablet (1 mg total) by mouth 3 (three) times daily as needed for anxiety. 11/17/13   Nishant Dhungel, MD  bisoprolol (ZEBETA) 5 MG tablet Take 1 tablet (5 mg total) by mouth daily. 11/08/14   [name], MD  buPROPion (WELLBUTRIN) 75 MG tablet Take 75 mg by mouth every morning.     Historical Provider, MD  Cholecalciferol (VITAMIN D3) 5000 UNITS CAPS Take 5,000 Units by mouth daily.    Historical  Provider, MD  Menaquinone-7 (VITAMIN K2 PO) Take 1 tablet by mouth daily.    Historical Provider, MD  mirtazapine (REMERON) 30 MG tablet Take 30 mg by mouth at bedtime.     Historical Provider, MD  nitrofurantoin, macrocrystal-monohydrate, (MACROBID) 100 MG capsule Take 1 capsule (100 mg total) by mouth 2 (two) times daily. 09/16/14   Jarome Matin, MD  nortriptyline (PAMELOR) 75 MG capsule Take 75 mg by mouth at bedtime.    Historical Provider, MD  predniSONE (DELTASONE) 20 MG tablet Take 1 tablet (20 mg total) by mouth daily before breakfast. 09/16/14   Jarome Matin, MD  saccharomyces boulardii (FLORASTOR) 250 MG capsule Take 1 capsule (250 mg total) by mouth 2 (two) times daily. 09/16/14   Jarome Matin, MD  travoprost,  benzalkonium, (TRAVATAN) 0.004 % ophthalmic solution Place 1 drop into both eyes at bedtime.    Historical Provider, MD   BP 142/63 mmHg  Pulse 88  Temp(Src) 98.1 F (36.7 C) (Oral)  Resp 22  SpO2 96% Physical Exam  Constitutional: She is oriented to person, place, and time. She appears well-developed and well-nourished.  HENT:  Head: Normocephalic and atraumatic.  Eyes: Pupils are equal, round, and reactive to light.  Cardiovascular: Normal rate and regular rhythm.   No murmur heard. Pulmonary/Chest: Effort normal and breath sounds normal. No respiratory distress.  Abdominal: Soft. There is no tenderness. There is no rebound and no guarding.  Musculoskeletal: She exhibits no tenderness.  2+ pitting edema bilateral lower extremities  Neurological: She is alert and oriented to person, place, and time. No cranial nerve deficit.  5 out of 5 strength in all 4 extremities.  Skin: Skin is warm and dry.  Psychiatric: She has a normal mood and affect. Her behavior is normal.  Nursing note and vitals reviewed.   ED Course  Procedures (including critical care time) Labs Review Labs Reviewed  DIFFERENTIAL - Abnormal; Notable for the following:    Neutrophils Relative % 78 (*)    All other components within normal limits  COMPREHENSIVE METABOLIC PANEL - Abnormal; Notable for the following:    Glucose, Bld 108 (*)    Total Protein 6.1 (*)    Albumin 3.3 (*)    ALT 11 (*)    All other components within normal limits  URINALYSIS, ROUTINE W REFLEX MICROSCOPIC (NOT AT [facility]) - Abnormal; Notable for the following:    APPearance CLOUDY (*)    Ketones, ur 40 (*)    Leukocytes, UA SMALL (*)    All other components within normal limits  URINE MICROSCOPIC-ADD ON - Abnormal; Notable for the following:    Bacteria, UA MANY (*)    All other components within normal limits  URINE CULTURE  PROTIME-INR  APTT  CBC  I-STAT TROPOININ, ED    Imaging Review Ct Head Wo Contrast  03/17/2015    CLINICAL DATA:  Patient said she has frontal headaches that come and go  EXAM: CT HEAD WITHOUT CONTRAST  TECHNIQUE: Contiguous axial images were obtained from the base of the skull through the vertex without intravenous contrast.  COMPARISON:  11/17/2013  FINDINGS: Ventricles normal configuration. There is ventricular and sulcal enlargement reflecting mild age related atrophy. There are no parenchymal masses or mass effect. There is no evidence of a recent cortical infarct. There are areas of white matter hypoattenuation consistent with mild to moderate chronic microvascular ischemic change. Several small more focal areas of white matter hypoattenuation are likely old lacune infarcts.  There are no  extra-axial masses or abnormal fluid collections.  There is no intracranial hemorrhage.  Mild mucosal thickening in some dependent fluid lies in the left sphenoid sinus. Remaining visualized sinuses are clear. Clear mastoid air cells.  IMPRESSION: 1. No acute intracranial abnormalities. 2. Mild atrophy and mild to moderate chronic microvascular ischemic change.   Electronically Signed   By: Amie Portland M.D.   On: 03/17/2015 20:06     EKG Interpretation   Date/Time:  Friday Mar 17 2015 19:28:38 EDT Ventricular Rate:  86 PR Interval:  208 QRS Duration: 123 QT Interval:  425 QTC Calculation: 508 R Axis:   28 Text Interpretation:  Sinus rhythm Repol abnrm suggests ischemia, diffuse  leads Prolonged QT interval Confirmed by Lincoln Brigham [phone]) on 03/17/2015  7:41:50 PM      MDM   Final diagnoses:  Expressive aphasia    Patient here for evaluation of nausea, speech difficulties and confusion home. Additional history available from family after initial ED evaluation. Family states the patient was talking out of her head and decided about changing tires in a car that she does not have. Patient has a nonfocal neurologic exam in the emergency department. UA is consistent with UTI. Given patient's nausea and  vomiting as well as upper abdominal discomfort discussed with medicine regarding admission for observation of her mental status and treatment of UTI.    Leah Fossa, MD 03/17/15 [phone]

## 2015-03-17 NOTE — ED Notes (Signed)
Per EMS pt c/o aphasia around 11am today that lasted approx 15 minutes. per pt family. LSN unknown per pt family. Pt c/o abdominal pain and n/v. Pt received 4mg  Zofran. Pt sts she is "unable to get her words out". NIH 0 at this time. Pt is A&Ox4. Pt also c/o urinary changes. EMS Vitals 150/94, 84 and regular, 95% Rm Air CBG 108. 98.1 Temp 18gauge L AC.

## 2015-03-17 NOTE — H&P (Signed)
[facility] History and Physical  [name] MWU:[id number] DOB: 1935/10/03 DOA: 03/17/2015  Referring physician: Madilyn Hook PCP: [name], MD   Chief Complaint: confusion   HPI:  Patient is 80 year old female with multiple and complex medical history including mild cognitive impairment, chronic systolic and diastolic CHF, recurrent UTIs, CAD, aortic stenosis with history of falls, presented to [facility] emergency department with main concern off sudden onset of confusion noted by family members. Please note that patient unable to provide detailed history during the admission secondary to some confusion but is able to answer some basic questions. Family reports patient was in her usual state of health and earlier in the afternoon started to talk with no sense. In addition family reported several episodes of nonbloody vomiting. Patient explains she has had intermittent episodes of right upper quadrant pain that has been sharp and throbbing at times, 3 out of 10 in severity when present, occasionally but not consistently radiating to the back, no specific aggravating or alleviating factors. Patient also explains several days duration of nonproductive cough, worse when laying down flat. Patient denies fevers or chills, reports history of recurrent UTIs but denies any specific urinary concerns such as urinary urgency, dysuria, frequency, hematuria.  In emergency department, patient noted to be hemodynamically stable, vital signs stable, blood work unremarkable, urinalysis suggestive of UTI. Patient alert and oriented to name, place, year (not oriented to month), demonstrating intermittent confusion with random speech. CT head unremarkable. TRH asked to admit for further evaluation.  Assessment and Plan: Active Problems: Acute encephalopathy with expressive aphasia - Unclear etiology at this time, no clear signs of an infectious etiology that could explain acute change in mental status -  Urinalysis is suggestive of UTI but vital signs and blood work unremarkable and patient denies any specific urinary symptoms as noted above - Given abdominal discomfort with reports of vomiting, it is reasonable to proceed with CT abdomen and pelvis for further evaluation - Also physical exam notable for bibasilar rhonchi, patient coughing throughout the exam, we will therefore obtain chest x-ray to rule out an infectious process versus pulmonary vascular congestion - Admit to telemetry bed - Neurochecks every 4 hours for now - MRI brain requested to rule out CVA, no further stroke workup recommended if MRI negative Nausea and vomiting - Has not had any episodes of vomiting while in the emergency department - We'll try to give clear liquids to see if patient able to tolerate - Provide antiemetics as needed - Follow-up on CT abdomen and pelvis Questionable UTI - Again urinalysis suggestive of UTI but patient noninfectious appearing - Urine culture requested, will place on empiric Levaquin for now and readjust the regimen as clinically indicated Nonproductive cough - Chest x-ray requested for further evaluation Combined systolic and diastolic CHF, chronic - Last 2-D echo November 2015 with EF 45% and grade 1 diastolic CHF - Chest x-ray requested for further evaluation - IV fluids for now as mild crackles noted at lung bases with scattered rhonchi - Monitor daily weights, weight on admission 142 lbs - Strict I/O Hypertension - Continue home regimen with bisoprolol DVT prophylaxis - Lovenox SQ  Radiological Exams on Admission: Ct Head Wo Contrast  03/17/2015   CLINICAL DATA:  Patient said she has frontal headaches that come and go  EXAM: CT HEAD WITHOUT CONTRAST  TECHNIQUE: Contiguous axial images were obtained from the base of the skull through the vertex without intravenous contrast.  COMPARISON:  11/17/2013  FINDINGS: Ventricles normal configuration. There  is ventricular and sulcal  enlargement reflecting mild age related atrophy. There are no parenchymal masses or mass effect. There is no evidence of a recent cortical infarct. There are areas of white matter hypoattenuation consistent with mild to moderate chronic microvascular ischemic change. Several small more focal areas of white matter hypoattenuation are likely old lacune infarcts.  There are no extra-axial masses or abnormal fluid collections.  There is no intracranial hemorrhage.  Mild mucosal thickening in some dependent fluid lies in the left sphenoid sinus. Remaining visualized sinuses are clear. Clear mastoid air cells.  IMPRESSION: 1. No acute intracranial abnormalities. 2. Mild atrophy and mild to moderate chronic microvascular ischemic change.   Electronically Signed   By: Amie Portland M.D.   On: 03/17/2015 20:06    Code Status: Full Family Communication: family at bedside  Disposition Plan: Admit for further evaluation    Danie Binder [facility] [phone]   Review of Systems:  Constitutional: Negative for fever, chills and malaise/fatigue. Negative for diaphoresis.  HENT: Negative for hearing loss, ear pain, nosebleeds, congestion, sore throat, neck pain, tinnitus and ear discharge.   Eyes: Negative for blurred vision, double vision, photophobia, pain, discharge and redness.  Respiratory: Negative for wheezing and stridor.   Cardiovascular: Negative for chest pain, palpitations, claudication and leg swelling.  Gastrointestinal: Negative for heartburn, constipation, blood in stool and melena.  Genitourinary: Negative for dysuria, urgency, frequency, hematuria and flank pain.  Musculoskeletal: Negative for myalgias, back pain, joint pain.  Skin: Negative for itching and rash.  Neurological: per history of present illness  Endo/Heme/Allergies: Negative for environmental allergies and polydipsia. Does not bruise/bleed easily.  Psychiatric/Behavioral: Negative for suicidal ideas. The patient is not nervous/anxious.       Past Medical History  Diagnosis Date  . Osteoporosis   . Rheumatoid arthritis(714.0)   . Murmur, cardiac   . Pneumonia   . Anxiety   . Adrenal insufficiency, primary, iatrogenic   . Gait instability   . Physical deconditioning   . SOBOE (shortness of breath on exertion)     echo 02/23/13-EF 55-60%, possible bicuspid aortic valve with mod to severed stenosis   . Palpitations   . Chest pain     myoview 04/13/04-inferoapical thinning with a suggestion of mild apical ischemia, EF 66%  . Syncope   . Aortic stenosis, severe 02/26/2013  . Pancreatic insufficiency 11/09/2012  . UTI (lower urinary tract infection) 10/01/2011  . Hearing loss 03/25/2013  . Memory deficit 03/25/2013  . Depression 03/25/2013  . Pulmonary fibrosis 03/25/2013  . Coronary artery disease     Past Surgical History  Procedure Laterality Date  . Knee arthroscopy      2 on left  . Cholecystectomy    . Thyroidectomy    . Abdominal hysterectomy    . Cardiac catheterization  11/08/96    no evidence of CAD, nl LV function  . Shoulder surgery Left   . Right heart catheterization  03/22/2013    Procedure: RIGHT HEART CATH;  Surgeon: [name], MD;  Location: [facility] CATH LAB;  Service: Cardiovascular;;  . Coronary angiogram  03/22/2013    Procedure: CORONARY ANGIOGRAM;  Surgeon: [name], MD;  Location: [facility] CATH LAB;  Service: Cardiovascular;;    Social History:  reports that she has never smoked. She has never used smokeless tobacco. She reports that she does not drink alcohol or use illicit drugs.  Allergies  Allergen Reactions  . Penicillins Hives    Family History  Problem Relation  Age of Onset  . Stroke Brother   . Cancer - Colon Father   . Cancer Sister     spine?    Prior to Admission medications   Medication Sig Start Date End Date Taking? Authorizing Provider  ALPRAZolam Prudy Feeler) 1 MG tablet Take 1 tablet (1 mg total) by mouth 3 (three) times daily as needed for anxiety. 11/17/13  Yes Nishant  Dhungel, MD  bisoprolol (ZEBETA) 5 MG tablet Take 1 tablet (5 mg total) by mouth daily. 11/08/14  Yes [name], MD  buPROPion (WELLBUTRIN) 75 MG tablet Take 75 mg by mouth every morning.    Yes Historical Provider, MD  Cholecalciferol (VITAMIN D3) 5000 UNITS CAPS Take 50,000 Units by mouth once a week. Wednesday   Yes Historical Provider, MD  Menaquinone-7 (VITAMIN K2 PO) Take 1 tablet by mouth daily.   Yes Historical Provider, MD  mirtazapine (REMERON) 30 MG tablet Take 30 mg by mouth at bedtime.    Yes Historical Provider, MD  nortriptyline (PAMELOR) 75 MG capsule Take 75 mg by mouth at bedtime.   Yes Historical Provider, MD  travoprost, benzalkonium, (TRAVATAN) 0.004 % ophthalmic solution Place 1 drop into both eyes at bedtime.   Yes Historical Provider, MD    Physical Exam: Filed Vitals:   03/17/15 2115 03/17/15 2130 03/17/15 2145 03/17/15 2200  BP: 125/53 128/50 126/100 127/52  Pulse: 84 80 83 82  Temp:      TempSrc:      Resp: 22 16 17 20   SpO2: 94% 93% 95% 95%    Physical Exam  Constitutional: Appears well-developed and well-nourished. No distress.  HENT: Normocephalic. External right and left ear normal. Oropharynx is clear and moist.  Eyes: Conjunctivae and EOM are normal. PERRLA, no scleral icterus.  Neck: Normal ROM. Neck supple. No JVD. No tracheal deviation. No thyromegaly.  CVS: RRR, S1/S2 +,  SEM 3/6, no gallops, no carotid bruit.  Pulmonary: Effort and breath sounds normal, no stridor,  scattered rhonchi's with bibasilar crackles Abdominal: Soft. BS +,  no distension, tenderness, rebound or guarding.  Musculoskeletal: Normal range of motion.  Lymphadenopathy: No lymphadenopathy noted, cervical, inguinal. Neuro: Alert. Normal reflexes, muscle tone coordination. Speech clear, occasionally tangential, follows commands appropriately Skin: Skin is warm and dry. No rash noted. Not diaphoretic. No erythema. No pallor.  Psychiatric: Normal mood and affect.   Labs on  Admission:  Basic Metabolic Panel:  Recent Labs Lab 03/17/15 1919  NA 141  K 3.6  CL 101  CO2 25  GLUCOSE 108*  BUN 12  CREATININE 0.76  CALCIUM 9.0   Liver Function Tests:  Recent Labs Lab 03/17/15 1919  AST 15  ALT 11*  ALKPHOS 73  BILITOT 1.0  PROT 6.1*  ALBUMIN 3.3*   CBC:  Recent Labs Lab 03/17/15 1919  WBC 9.1  NEUTROABS 7.1  HGB 13.8  HCT 41.6  MCV 90.0  PLT 212    EKG: Pending    If 7PM-7AM, please contact night-coverage [email or web address] Password [facility] 03/17/2015, 10:21 PM

## 2015-03-17 NOTE — Progress Notes (Signed)
Pt admitted to the unit. Pt is stable, alert, and oriented. Oriented to room, staff, and call bell. Educated to call for any assistance. Bed in lowest position, call bell within reach- will continue to monitor.

## 2015-03-18 ENCOUNTER — Inpatient Hospital Stay (HOSPITAL_COMMUNITY): Payer: Medicare Other

## 2015-03-18 ENCOUNTER — Encounter (HOSPITAL_COMMUNITY): Payer: Self-pay | Admitting: *Deleted

## 2015-03-18 DIAGNOSIS — E440 Moderate protein-calorie malnutrition: Secondary | ICD-10-CM | POA: Insufficient documentation

## 2015-03-18 DIAGNOSIS — R410 Disorientation, unspecified: Secondary | ICD-10-CM

## 2015-03-18 DIAGNOSIS — E11649 Type 2 diabetes mellitus with hypoglycemia without coma: Secondary | ICD-10-CM | POA: Diagnosis not present

## 2015-03-18 DIAGNOSIS — B9620 Unspecified Escherichia coli [E. coli] as the cause of diseases classified elsewhere: Secondary | ICD-10-CM

## 2015-03-18 DIAGNOSIS — N390 Urinary tract infection, site not specified: Secondary | ICD-10-CM | POA: Diagnosis not present

## 2015-03-18 LAB — COMPREHENSIVE METABOLIC PANEL
ALT: 8 U/L — AB (ref 14–54)
ANION GAP: 9 (ref 5–15)
AST: 11 U/L — AB (ref 15–41)
Albumin: 2.8 g/dL — ABNORMAL LOW (ref 3.5–5.0)
Alkaline Phosphatase: 63 U/L (ref 38–126)
BUN: 11 mg/dL (ref 6–20)
CALCIUM: 9 mg/dL (ref 8.9–10.3)
CHLORIDE: 104 mmol/L (ref 101–111)
CO2: 27 mmol/L (ref 22–32)
CREATININE: 0.7 mg/dL (ref 0.44–1.00)
GFR calc Af Amer: >60 mL/min (ref >60)
Glucose, Bld: 64 mg/dL — ABNORMAL LOW (ref 65–99)
Potassium: 3.2 mmol/L — ABNORMAL LOW (ref 3.5–5.1)
Sodium: 140 mmol/L (ref 135–145)
TOTAL PROTEIN: 5.5 g/dL — AB (ref 6.5–8.1)
Total Bilirubin: 0.9 mg/dL (ref 0.3–1.2)

## 2015-03-18 LAB — CBC
HCT: 39.4 % (ref 36.0–46.0)
Hemoglobin: 12.9 g/dL (ref 12.0–15.0)
MCH: 29.8 pg (ref 26.0–34.0)
MCHC: 32.7 g/dL (ref 30.0–36.0)
MCV: 91 fL (ref 78.0–100.0)
Platelets: 193 10*3/uL (ref 150–400)
RBC: 4.33 MIL/uL (ref 3.87–5.11)
RDW: 13.8 % (ref 11.5–15.5)
WBC: 8.1 10*3/uL (ref 4.0–10.5)

## 2015-03-18 LAB — GLUCOSE, CAPILLARY
GLUCOSE-CAPILLARY: 135 mg/dL — AB (ref 65–99)
Glucose-Capillary: 149 mg/dL — ABNORMAL HIGH (ref 65–99)
Glucose-Capillary: 149 mg/dL — ABNORMAL HIGH (ref 65–99)

## 2015-03-18 LAB — TSH: TSH: 4.934 u[IU]/mL — ABNORMAL HIGH (ref 0.350–4.500)

## 2015-03-18 MED ORDER — SODIUM CHLORIDE 0.9 % IV SOLN
INTRAVENOUS | Status: DC
  Administered 2015-03-18 – 2015-03-19 (×2): 1000 mL via INTRAVENOUS
  Administered 2015-03-19 – 2015-03-20 (×2): via INTRAVENOUS

## 2015-03-18 MED ORDER — INSULIN ASPART 100 UNIT/ML ~~LOC~~ SOLN
0.0000 [IU] | Freq: Three times a day (TID) | SUBCUTANEOUS | Status: DC
  Administered 2015-03-18 – 2015-03-22 (×4): 1 [IU] via SUBCUTANEOUS

## 2015-03-18 MED ORDER — ACETAMINOPHEN 325 MG PO TABS
650.0000 mg | ORAL_TABLET | Freq: Four times a day (QID) | ORAL | Status: DC | PRN
Start: 2015-03-18 — End: 2015-03-22
  Administered 2015-03-19 – 2015-03-20 (×2): 650 mg via ORAL
  Filled 2015-03-18 (×2): qty 2

## 2015-03-18 MED ORDER — IOHEXOL 300 MG/ML  SOLN
100.0000 mL | Freq: Once | INTRAMUSCULAR | Status: AC | PRN
  Administered 2015-03-18: 100 mL via INTRAVENOUS

## 2015-03-18 MED ORDER — LEVOFLOXACIN 250 MG PO TABS
250.0000 mg | ORAL_TABLET | Freq: Every day | ORAL | Status: DC
  Administered 2015-03-18 – 2015-03-19 (×2): 250 mg via ORAL
  Filled 2015-03-18 (×3): qty 1

## 2015-03-18 MED ORDER — ENSURE ENLIVE PO LIQD
237.0000 mL | Freq: Two times a day (BID) | ORAL | Status: DC
  Administered 2015-03-18 – 2015-03-22 (×6): 237 mL via ORAL

## 2015-03-18 MED ORDER — ALBUTEROL SULFATE (2.5 MG/3ML) 0.083% IN NEBU
2.5000 mg | INHALATION_SOLUTION | Freq: Two times a day (BID) | RESPIRATORY_TRACT | Status: DC
  Administered 2015-03-18 (×2): 2.5 mg via RESPIRATORY_TRACT
  Filled 2015-03-18 (×3): qty 3

## 2015-03-18 MED ORDER — POTASSIUM CHLORIDE CRYS ER 20 MEQ PO TBCR
40.0000 meq | EXTENDED_RELEASE_TABLET | Freq: Once | ORAL | Status: AC
  Administered 2015-03-18: 40 meq via ORAL
  Filled 2015-03-18: qty 2

## 2015-03-18 MED ORDER — IOHEXOL 300 MG/ML  SOLN
25.0000 mL | INTRAMUSCULAR | Status: AC
  Administered 2015-03-18 (×2): 25 mL via ORAL

## 2015-03-18 NOTE — Progress Notes (Signed)
PROGRESS NOTE  [name] YPP:[id number] DOB: December 09, 1934 DOA: 03/17/2015 PCP: [name], MD  Assessment/Plan: Acute encephalopathy with expressive aphasia ?UTI - CT scan WNL - MRI brain requested to rule out CVA and was negative, no further stroke workup recommended since MRI negative  Nausea and vomiting - Has not had any episodes of vomiting while in the emergency department - tolerating diet - Provide antiemetics as needed  Questionable UTI - Again urinalysis suggestive of UTI but patient noninfectious appearing - Urine culture requested, will place on empiric Levaquin for now and readjust the regimen as clinically indicated  Nonproductive cough - pleural effusion but improved  Combined systolic and diastolic CHF, chronic - Last 2-D echo November 2015 with EF 45% and grade 1 diastolic CHF - IV fluids for now as mild crackles noted at lung bases with scattered rhonchi - Monitor daily weights, weight on admission 142 lbs - Strict I/O  Hypertension - Continue home regimen with bisoprolol  Hypokalemia -replace  Code Status: full Family Communication: LM with daughter Disposition Plan: PT eval   Consultants:    Procedures:      HPI/Subjective: No SOB, no CP  Objective: Filed Vitals:   03/18/15 0500  BP: 130/52  Pulse: 84  Temp: 98.9 F (37.2 C)  Resp: 18   No intake or output data in the 24 hours ending 03/18/15 0943 Filed Weights   03/17/15 2354 03/18/15 0224  Weight: 57 kg (125 lb 10.6 oz) 58 kg (127 lb 13.9 oz)    Exam:   General:  Awake, hard of hearing- oriented to person  Cardiovascular: rrr  Respiratory: clear  Abdomen: +Bs, soft  Musculoskeletal: no edema   Data Reviewed: Basic Metabolic Panel:  Recent Labs Lab 03/17/15 1919 03/18/15 0425  NA 141 140  K 3.6 3.2*  CL 101 104  CO2 25 27  GLUCOSE 108* 64*  BUN 12 11  CREATININE 0.76 0.70  CALCIUM 9.0 9.0   Liver Function Tests:  Recent Labs Lab  03/17/15 1919 03/18/15 0425  AST 15 11*  ALT 11* 8*  ALKPHOS 73 63  BILITOT 1.0 0.9  PROT 6.1* 5.5*  ALBUMIN 3.3* 2.8*   No results for input(s): LIPASE, AMYLASE in the last 168 hours. No results for input(s): AMMONIA in the last 168 hours. CBC:  Recent Labs Lab 03/17/15 1919 03/18/15 0425  WBC 9.1 8.1  NEUTROABS 7.1  --   HGB 13.8 12.9  HCT 41.6 39.4  MCV 90.0 91.0  PLT 212 193   Cardiac Enzymes: No results for input(s): CKTOTAL, CKMB, CKMBINDEX, TROPONINI in the last 168 hours. BNP (last 3 results) No results for input(s): BNP in the last 8760 hours.  ProBNP (last 3 results) No results for input(s): PROBNP in the last 8760 hours.  CBG: No results for input(s): GLUCAP in the last 168 hours.  No results found for this or any previous visit (from the past 240 hour(s)).   Studies: X-ray Chest Pa And Lateral  03/18/2015   CLINICAL DATA:  Abdominal pain, history of aortic stenosis and CHF  EXAM: CHEST  2 VIEW  COMPARISON:  09/09/2014  FINDINGS: Aortic graft in place. Heart size mildly enlarged with aortic unfolding/ectasia. Stable prominence of the superior vascular pedicle. Diffusely prominent interstitial reticular opacities are noted. No new focal pulmonary opacity. Small left pleural effusion or thickening again noted. Remote right-sided rib deformities.  IMPRESSION: Small left pleural effusion, decreased from previously, possibly thickening/scarring.   Electronically Signed   By: Vinnie Langton  Green M.D.   On: 03/18/2015 08:44   Ct Head Wo Contrast  03/17/2015   CLINICAL DATA:  Patient said she has frontal headaches that come and go  EXAM: CT HEAD WITHOUT CONTRAST  TECHNIQUE: Contiguous axial images were obtained from the base of the skull through the vertex without intravenous contrast.  COMPARISON:  11/17/2013  FINDINGS: Ventricles normal configuration. There is ventricular and sulcal enlargement reflecting mild age related atrophy. There are no parenchymal masses or mass  effect. There is no evidence of a recent cortical infarct. There are areas of white matter hypoattenuation consistent with mild to moderate chronic microvascular ischemic change. Several small more focal areas of white matter hypoattenuation are likely old lacune infarcts.  There are no extra-axial masses or abnormal fluid collections.  There is no intracranial hemorrhage.  Mild mucosal thickening in some dependent fluid lies in the left sphenoid sinus. Remaining visualized sinuses are clear. Clear mastoid air cells.  IMPRESSION: 1. No acute intracranial abnormalities. 2. Mild atrophy and mild to moderate chronic microvascular ischemic change.   Electronically Signed   By: Amie Portland M.D.   On: 03/17/2015 20:06   Mr Brain Wo Contrast  03/18/2015   CLINICAL DATA:  Mild cognitive impairment, congestive heart failure, aortic stenosis with history of falls, developed sudden onset of confusion. Intermittent frontal headaches.  EXAM: MRI HEAD WITHOUT CONTRAST  TECHNIQUE: Multiplanar, multiecho pulse sequences of the brain and surrounding structures were obtained without intravenous contrast.  COMPARISON:  CT head 03/17/2015.  FINDINGS: No evidence for acute infarction, hemorrhage, mass lesion, hydrocephalus, or extra-axial fluid. Mild cerebral and cerebellar atrophy. Moderately advanced subcortical and periventricular T2 and FLAIR hyperintensities, likely chronic microvascular ischemic change.  Pituitary, pineal, and cerebellar tonsils unremarkable. No upper cervical lesions. Flow voids are maintained throughout the carotid, basilar, and vertebral arteries. There are no areas of chronic hemorrhage.  Mild chronic sinus disease LEFT sphenoid division. No sinus air-fluid levels. Negative orbits. Extracranial soft tissues otherwise unremarkable.  IMPRESSION: Age-related changes as described.  No acute intracranial findings.  Mild chronic paranasal sinus disease without significant air-fluid level.  No acute abnormality  is seen which might contribute to headaches or confusion.   Electronically Signed   By: Davonna Belling M.D.   On: 03/18/2015 08:34   Ct Abdomen Pelvis W Contrast  03/18/2015   CLINICAL DATA:  Right upper quadrant abdominal pain  EXAM: CT ABDOMEN AND PELVIS WITH CONTRAST  TECHNIQUE: Multidetector CT imaging of the abdomen and pelvis was performed using the standard protocol following bolus administration of intravenous contrast.  CONTRAST:  OMNIPAQUE IOHEXOL 300 MG/ML  SOLN  COMPARISON:  09/16/2011  FINDINGS: Lower chest: Evidence of aortic and mitral valvuloplasty. Heart size upper limits of normal. Small hiatal hernia. Dependent bibasilar atelectasis.  Hepatobiliary: 5 mm too small to characterize posterior segment right hepatic lobe hypodense lesion is stable, most likely a cyst or hamartoma. Cholecystectomy clips are noted. Stable dilatation of the common duct with tapering to the ampulla, maximal diameter 1.3 cm image 29.  Pancreas: Large to adrenal diverticulum. Pancreas partly fatty replaced with duct diameter at upper limits of normal measuring 3 mm at the level the pancreatic head. No peripancreatic fluid or mass identified.  Spleen: 4 mm subjectively fluid density splenic lesion could represent a cyst although lymphangioma or hemangioma could appear similar.  Adrenals/Urinary Tract: Bilateral renal cortical cysts are reidentified, subjectively slightly increased from previously, largest right mid kidney 0.7 cm image 31. No hydroureteronephrosis. No radiopaque renal or  ureteral calculus.  Stomach/Bowel: Appendix not visualized, no secondary evidence for acute appendicitis. Colonic diverticuli noted without evidence for diverticulitis.  Vascular/Lymphatic: Moderate atheromatous aortic calcification without aneurysm. No lymphadenopathy.  Reproductive: Uterus and ovaries presumed surgically absent, not visualized.  Other: No free air or fluid.  Musculoskeletal: Remote right inferior and superior pubic  rami fractures and remote left inferior pubic ramus fracture. Streak artifact from left total hip arthroplasty. Lumbar spine disc degenerative change noted at multiple levels. Compression deformities at L2 and L4 are new since the prior exam but otherwise age indeterminate.  IMPRESSION: No acute intra-abdominal or pelvic pathology.  Age indeterminate L2 and L4 compression deformities.   Electronically Signed   By: Christiana Pellant M.D.   On: 03/18/2015 08:36    Scheduled Meds: . albuterol  2.5 mg Nebulization BID  . bisoprolol  5 mg Oral Daily  . buPROPion  75 mg Oral Q breakfast  . enoxaparin (LOVENOX) injection  40 mg Subcutaneous Daily  . feeding supplement (ENSURE ENLIVE)  237 mL Oral BID BM  . guaiFENesin  600 mg Oral BID  . levofloxacin (LEVAQUIN) IV  500 mg Intravenous Q24H  . mirtazapine  30 mg Oral QHS  . Travoprost (BAK Free)  1 drop Both Eyes QHS   Continuous Infusions:  Antibiotics Given (last 72 hours)    None      Active Problems:   E. coli UTI (urinary tract infection)   DM2 (diabetes mellitus, type 2)   COPD (chronic obstructive pulmonary disease)   Altered mental state    Time spent: 25 min    Tynisa Vohs  [facility] Pager [phone]. If 7PM-7AM, please contact night-coverage at [email or web address], password [facility] 03/18/2015, 9:43 AM  LOS: 1 day

## 2015-03-18 NOTE — Evaluation (Signed)
Physical Therapy Evaluation Patient Details Name: [name] MRN: [id number] DOB: March 22, 1935 Today's Date: 03/18/2015   History of Present Illness  Pt adm with acute encephalopathy and aphasia. MRI neg. PMH - RA, memory deficit, CAD  Clinical Impression  Pt admitted with above diagnosis and presents to PT with functional limitations due to deficits listed below (See PT problem list). Pt needs skilled PT to maximize independence and safety to allow discharge to home with assistance from family as needed. Expect she will return to baseline with mobility fairly quickly.     Follow Up Recommendations Home health PT (may not need if progresses rapidly);Supervision/Assistance - 24 hour (for initial few days)    Equipment Recommendations  None recommended by PT    Recommendations for Other Services       Precautions / Restrictions Precautions Precautions: Fall      Mobility  Bed Mobility Overal bed mobility: Needs Assistance Bed Mobility: Supine to Sit     Supine to sit: Min assist     General bed mobility comments: Assist to bring trunk up  Transfers Overall transfer level: Needs assistance Equipment used: Rolling walker (2 wheeled) Transfers: Sit to/from Stand Sit to Stand: Min guard         General transfer comment: Assist for balance.  Ambulation/Gait Ambulation/Gait assistance: Min guard Ambulation Distance (Feet): 30 Feet Assistive device: Rolling walker (2 wheeled) Gait Pattern/deviations: Step-through pattern;Decreased step length - right;Decreased step length - left   Gait velocity interpretation: Below normal speed for age/gender General Gait Details: Assist for balance. Improved stability as distance increased.  Stairs            Wheelchair Mobility    Modified Rankin (Stroke Patients Only)       Balance Overall balance assessment: Needs assistance Sitting-balance support: No upper extremity supported;Feet supported Sitting  balance-Leahy Scale: Good     Standing balance support: Bilateral upper extremity supported Standing balance-Leahy Scale: Poor Standing balance comment: support of walker                             Pertinent Vitals/Pain Pain Assessment: No/denies pain    Home Living Family/patient expects to be discharged to:: Private residence Living Arrangements: Alone Available Help at Discharge: Family;Available 24 hours/day (if needed) Type of Home: Apartment Home Access: Stairs to enter   Entrance Stairs-Number of Steps: 5 then 3. the first set of stairs has a rail. Home Layout: One level Home Equipment: Walker - 2 wheels;Walker - 4 wheels Additional Comments: During past admission daughter stated she could stay with pt after DC if needed.    Prior Function Level of Independence: Independent with assistive device(s)         Comments: Uses cane or walker     Hand Dominance        Extremity/Trunk Assessment   Upper Extremity Assessment: Defer to OT evaluation           Lower Extremity Assessment: Generalized weakness         Communication      Cognition Arousal/Alertness: Awake/alert Behavior During Therapy: WFL for tasks assessed/performed Overall Cognitive Status: No family/caregiver present to determine baseline cognitive functioning                      General Comments      Exercises        Assessment/Plan    PT Assessment Patient needs continued PT services  PT Diagnosis Difficulty walking;Generalized weakness   PT Problem List Decreased strength;Decreased activity tolerance;Decreased balance;Decreased mobility;Decreased knowledge of use of DME  PT Treatment Interventions DME instruction;Gait training;Functional mobility training;Therapeutic activities;Balance training;Therapeutic exercise;Patient/family education   PT Goals (Current goals can be found in the Care Plan section) Acute Rehab PT Goals Patient Stated Goal: go home PT  Goal Formulation: With patient Time For Goal Achievement: 03/25/15 Potential to Achieve Goals: Good    Frequency Min 3X/week   Barriers to discharge        Co-evaluation               End of Session Equipment Utilized During Treatment: Gait belt Activity Tolerance: Patient tolerated treatment well Patient left: in chair;with call bell/phone within reach;with chair alarm set Nurse Communication: Mobility status         Time: 1208-1227 PT Time Calculation (min) (ACUTE ONLY): 19 min   Charges:   PT Evaluation $Initial PT Evaluation Tier I: 1 Procedure     PT G Codes:        Francia Verry 27-Mar-2015, 1:34 PM [facility] PT [phone]

## 2015-03-18 NOTE — Progress Notes (Signed)
Pharmacy Note: 80 yo F, 58 kg, creat cl ~ 46 ml/hr.  Plan:  Levaquin 500 mg IV q24 for UTI changed to 250 po q24  Thanks Herby Abraham, Pharm.D. [phone] 03/18/2015 1:51 PM

## 2015-03-18 NOTE — Plan of Care (Signed)
Problem: Phase I Progression Outcomes Goal: Initial discharge plan identified Outcome: Completed/Met Date Met:  03/18/15 Home when medically cleared.

## 2015-03-18 NOTE — Progress Notes (Signed)
BP was 83/42 & 87/45, re checked manually and was - Blood pressure 90/42, pulse 84, temperature 98.9 F (37.2 C), temperature source Oral, resp. rate 18, height 5' (1.524 m), weight 58 kg (127 lb 13.9 oz), SpO2 96 %.,r/a.  Pt. Scheduled to receive Bisoprolol 5 mg this am.  Text paged Dr. Benjamine Mola to inform.  Stated to hold for now and will order some IVF's.  Will continue to monitor.  Forbes Cellar, RN

## 2015-03-18 NOTE — Progress Notes (Signed)
Initial Nutrition Assessment  DOCUMENTATION CODES:  Non-severe (moderate) malnutrition in context of chronic illness  Pt meets criteria for MODERATE MALNUTRITION in the context of chronic illness as evidenced by moderate fat and muscle mass loss, and moderate fluid accumulation.  INTERVENTION:  Ensure Enlive (each supplement provides 350kcal and 20 grams of protein) (BID)   Encourage adequate PO intake.   NUTRITION DIAGNOSIS:  Inadequate oral intake related to  (dislike of food) as evidenced by meal completion < 25%.  GOAL:  Patient will meet greater than or equal to 90% of their needs  MONITOR:  PO intake, Supplement acceptance, Weight trends, Labs, I & O's  REASON FOR ASSESSMENT:  Malnutrition Screening Tool    ASSESSMENT: Pt with multiple and complex medical history including mild cognitive impairment, chronic systolic and diastolic CHF, recurrent UTIs, CAD, aortic stenosis with history of falls, presented to [facility] emergency department with main concern off sudden onset of confusion noted by family.  Pt reports having a good appetite. Meal completion during time of visit was 25% as pt reports she did not like her meal. Usual body reported to be ~140 lbs. Pt with a 10.5% weight loss in 5 months. Pt is unaware for reason of weight loss as he reports eating well at home with at least 2 meals a day and an occasional protein shake. Pt currently has Ensure ordered and has been consuming them. RD to continue with current orders. Pt was encouraged to eat her food at meals and to drink her supplements.   Nutrition-Focused physical exam completed. Findings are mild to moderate fat depletion, mild to moderate muscle depletion, and moderate edema.   Labs: Low potassium, AST, ALT.  Height:  Ht Readings from Last 1 Encounters:  03/17/15 5' (1.524 m)    Weight:  Wt Readings from Last 1 Encounters:  03/18/15 127 lb 13.9 oz (58 kg)    Ideal Body Weight:  45 kg  Wt  Readings from Last 10 Encounters:  03/18/15 127 lb 13.9 oz (58 kg)  09/21/14 142 lb 8 oz (64.638 kg)  09/10/14 286 lb 2.5 oz (129.8 kg)  03/16/14 144 lb 9.6 oz (65.59 kg)  11/15/13 153 lb 14.1 oz (69.8 kg)  09/17/13 147 lb 14.9 oz (67.1 kg)  06/29/13 153 lb 9.6 oz (69.673 kg)  05/05/13 152 lb 8 oz (69.174 kg)  04/22/13 151 lb 12.8 oz (68.856 kg)  04/06/13 151 lb 14.4 oz (68.901 kg)    BMI:  Body mass index is 24.97 kg/(m^2).  Estimated Nutritional Needs:  Kcal:  1500-1700  Protein:  70-85 grams  Fluid:  >/= 1.5 L/day  Skin:   (+3 LE edema)  Diet Order:  Diet regular Room service appropriate?: Yes; Fluid consistency:: Thin  EDUCATION NEEDS:  No education needs identified at this time   Intake/Output Summary (Last 24 hours) at 03/18/15 1453 Last data filed at 03/18/15 1049  Gross per 24 hour  Intake      0 ml  Output    375 ml  Net   -375 ml    Last BM:  5/26  Marijean Niemann, MS, RD, LDN Pager # [phone] After hours/ weekend pager # [phone]

## 2015-03-18 NOTE — Evaluation (Signed)
Clinical/Bedside Swallow Evaluation Patient Details  Name: [name] MRN: [id number] Date of Birth: 06/15/1935  Today's Date: 03/18/2015 Time: SLP Start Time (ACUTE ONLY): 1049 SLP Stop Time (ACUTE ONLY): 1106 SLP Time Calculation (min) (ACUTE ONLY): 17 min  Past Medical History:  Past Medical History  Diagnosis Date  . Osteoporosis   . Rheumatoid arthritis(714.0)   . Murmur, cardiac   . Pneumonia   . Anxiety   . Adrenal insufficiency, primary, iatrogenic   . Gait instability   . Physical deconditioning   . SOBOE (shortness of breath on exertion)     echo 02/23/13-EF 55-60%, possible bicuspid aortic valve with mod to severed stenosis   . Palpitations   . Chest pain     myoview 04/13/04-inferoapical thinning with a suggestion of mild apical ischemia, EF 66%  . Syncope   . Aortic stenosis, severe 02/26/2013  . Pancreatic insufficiency 11/09/2012  . UTI (lower urinary tract infection) 10/01/2011  . Hearing loss 03/25/2013  . Memory deficit 03/25/2013  . Depression 03/25/2013  . Pulmonary fibrosis 03/25/2013  . Coronary artery disease   . CHF (congestive heart failure)   . Diabetes mellitus without complication    Past Surgical History:  Past Surgical History  Procedure Laterality Date  . Knee arthroscopy      2 on left  . Cholecystectomy    . Thyroidectomy    . Abdominal hysterectomy    . Cardiac catheterization  11/08/96    no evidence of CAD, nl LV function  . Shoulder surgery Left   . Right heart catheterization  03/22/2013    Procedure: RIGHT HEART CATH;  Surgeon: [name], MD;  Location: [facility] CATH LAB;  Service: Cardiovascular;;  . Coronary angiogram  03/22/2013    Procedure: CORONARY ANGIOGRAM;  Surgeon: [name], MD;  Location: [facility] CATH LAB;  Service: Cardiovascular;;   HPI:  Patient is 80 year old female with multiple and complex medical history including mild cognitive impairment, chronic systolic and diastolic CHF, recurrent UTIs, CAD, aortic stenosis with  history of falls, presented to [facility] emergency department with main concern off sudden onset of confusion noted by family members. Urinalysis suggestive of UTI, CT of head unremarkable and MRI with no acute intracranial findings or abnormalities.   Assessment / Plan / Recommendation Clinical Impression  Pts swallowing appearing grossly within functional limits. No signs or symptoms of aspiration with any consistencies observed including thin liquid, puree, and regular textures. Recommend continue regular thin diet, medicines whole. Pt with baseline cognitive deficits yet displaying ability to self feed. No further ST intervetion indicated at this time.     Aspiration Risk  Mild    Diet Recommendation Thin   Medication Administration: Whole meds with liquid Compensations: Small sips/bites;Slow rate    Other  Recommendations Oral Care Recommendations: Oral care BID   Follow Up Recommendations       Frequency and Duration        Pertinent Vitals/Pain     SLP Swallow Goals     Swallow Study Prior Functional Status       General Date of Onset: 03/17/15 Other Pertinent Information: Patient is 80 year old female with multiple and complex medical history including mild cognitive impairment, chronic systolic and diastolic CHF, recurrent UTIs, CAD, aortic stenosis with history of falls, presented to [facility] emergency department with main concern off sudden onset of confusion noted by family members. Urinalysis suggestive of UTI, CT of head unremarkable and MRI with no acute intracranial findings or  abnormalities. Type of Study: Bedside swallow evaluation Diet Prior to this Study: Regular;Thin liquids Temperature Spikes Noted: No Respiratory Status: Room air History of Recent Intubation: No Behavior/Cognition: Confused;Distractible;Alert Oral Cavity - Dentition: Poor condition Self-Feeding Abilities: Able to feed self;Needs set up Patient Positioning: Upright in bed Baseline  Vocal Quality: Hoarse;Low vocal intensity Volitional Cough: Strong Volitional Swallow: Able to elicit    Oral/Motor/Sensory Function Overall Oral Motor/Sensory Function: Appears within functional limits for tasks assessed Labial ROM: Within Functional Limits Labial Symmetry: Within Functional Limits Labial Strength: Within Functional Limits Lingual ROM: Within Functional Limits Lingual Symmetry: Within Functional Limits Lingual Strength: Within Functional Limits Facial Symmetry: Within Functional Limits Facial Strength: Within Functional Limits   Ice Chips Ice chips: Within functional limits Presentation: Self Fed   Thin Liquid Thin Liquid: Within functional limits Presentation: Straw;Cup    Nectar Thick Nectar Thick Liquid: Not tested   Honey Thick Honey Thick Liquid: Not tested   Puree Puree: Within functional limits Presentation: Self Fed   Solid   GO    Marcene Duos MA, CCC-SLP Acute Care Speech Language Pathologist    Solid: Within functional limits Presentation: Self Fed       [place], Rebekah Zackery E 03/18/2015,11:13 AM

## 2015-03-18 NOTE — Progress Notes (Addendum)
NURSING PROGRESS NOTE  [name] [id number] Admitted to 5512: 03/17/2015 2350 Attending Provider: Dorothea Ogle, MD    [facility] [name] is a 80 y.o. female patient admitted from ED awake, alert  & orientated  X 4,  Full Code, VSS - Blood pressure 135/54, pulse 84, temperature 98 F (36.7 C), temperature source Oral, resp. rate 16, height 5' (1.524 m), weight 58 kg (127 lb 13.9 oz), SpO2 97 %.,RA, no c/o shortness of breath, no c/o chest pain, no distress noted, nontele  IV site WDL:  NSL to antecubital left, condition patent and no redness with a transparent dsg that's clean dry and intact.  Allergies:   Allergies  Allergen Reactions  . Penicillins Hives     Past Medical History  Diagnosis Date  . Osteoporosis   . Rheumatoid arthritis(714.0)   . Murmur, cardiac   . Pneumonia   . Anxiety   . Adrenal insufficiency, primary, iatrogenic   . Gait instability   . Physical deconditioning   . SOBOE (shortness of breath on exertion)     echo 02/23/13-EF 55-60%, possible bicuspid aortic valve with mod to severed stenosis   . Palpitations   . Chest pain     myoview 04/13/04-inferoapical thinning with a suggestion of mild apical ischemia, EF 66%  . Syncope   . Aortic stenosis, severe 02/26/2013  . Pancreatic insufficiency 11/09/2012  . UTI (lower urinary tract infection) 10/01/2011  . Hearing loss 03/25/2013  . Memory deficit 03/25/2013  . Depression 03/25/2013  . Pulmonary fibrosis 03/25/2013  . Coronary artery disease     History:  obtained from the patient.  Pt orientation to unit, room and routine. Information packet given to patient/family and safety video watched.  Admission INP armband ID verified with patient/family, and in place. SR up x 2, fall risk assessment complete with Patient and family verbalizing understanding of risks associated with falls. Pt verbalizes an understanding of how to use the call bell and to call for help before getting out of bed.  Skin, clean-dry- intact  without evidence of bruising, or skin tears.  Patient educated on hospitals policy of not using depends for incontinence and the risk associated with usage. No evidence of skin break down noted on exam. Contact precautions initiated d/t history of MDRO, patient education provided.   Will cont to monitor and assist as needed.  Julien Nordmann [place], RN 03/18/2015

## 2015-03-19 DIAGNOSIS — R410 Disorientation, unspecified: Secondary | ICD-10-CM | POA: Diagnosis not present

## 2015-03-19 DIAGNOSIS — E440 Moderate protein-calorie malnutrition: Secondary | ICD-10-CM | POA: Diagnosis not present

## 2015-03-19 DIAGNOSIS — E11649 Type 2 diabetes mellitus with hypoglycemia without coma: Secondary | ICD-10-CM | POA: Diagnosis not present

## 2015-03-19 LAB — URINE CULTURE

## 2015-03-19 LAB — BASIC METABOLIC PANEL
Anion gap: 8 (ref 5–15)
BUN: 13 mg/dL (ref 6–20)
CHLORIDE: 107 mmol/L (ref 101–111)
CO2: 24 mmol/L (ref 22–32)
CREATININE: 0.63 mg/dL (ref 0.44–1.00)
Calcium: 9 mg/dL (ref 8.9–10.3)
GFR calc Af Amer: >60 mL/min (ref >60)
GFR calc non Af Amer: >60 mL/min (ref >60)
GLUCOSE: 111 mg/dL — AB (ref 65–99)
Potassium: 3.9 mmol/L (ref 3.5–5.1)
Sodium: 139 mmol/L (ref 135–145)

## 2015-03-19 LAB — CBC
HCT: 40.1 % (ref 36.0–46.0)
Hemoglobin: 13 g/dL (ref 12.0–15.0)
MCH: 29.8 pg (ref 26.0–34.0)
MCHC: 32.4 g/dL (ref 30.0–36.0)
MCV: 92 fL (ref 78.0–100.0)
Platelets: 191 10*3/uL (ref 150–400)
RBC: 4.36 MIL/uL (ref 3.87–5.11)
RDW: 14 % (ref 11.5–15.5)
WBC: 9.5 10*3/uL (ref 4.0–10.5)

## 2015-03-19 LAB — GLUCOSE, CAPILLARY
Glucose-Capillary: 125 mg/dL — ABNORMAL HIGH (ref 65–99)
Glucose-Capillary: 97 mg/dL (ref 65–99)
Glucose-Capillary: 100 mg/dL — ABNORMAL HIGH (ref 65–99)

## 2015-03-19 NOTE — Progress Notes (Signed)
Offered to help patient to bed multiple times this shift. Patient has refused each time. RN attempted re-orientation, patient still maintains she does not want to go to bed. Will continue to monitor.

## 2015-03-19 NOTE — Progress Notes (Signed)
PROGRESS NOTE  [name] KCL:[id number] DOB: [birth year] DOA: 03/17/2015 PCP: [name], MD  Assessment/Plan: Acute encephalopathy with expressive aphasia ?UTI - CT scan WNL - MRI brain requested to rule out CVA and was negative, no further stroke workup recommended since MRI negative  Nausea and vomiting - Has not had any episodes of vomiting while in the emergency department - tolerating diet - Provide antiemetics as needed  Questionable UTI - Again urinalysis suggestive of UTI but patient noninfectious appearing - Urine culture requested, will place on empiric Levaquin for now and readjust the regimen as clinically indicated  Nonproductive cough - pleural effusion but improved  Combined systolic and diastolic CHF, chronic - Last 2-D echo November 2015 with EF 45% and grade 1 diastolic CHF - IV fluids for now as mild crackles noted at lung bases with scattered rhonchi - Monitor daily weights, weight on admission 142 lbs - Strict I/O  Hypertension - Continue home regimen with bisoprolol  Hypokalemia -replace  Code Status: full Family Communication: daughter Disposition Plan: PT eval   Consultants:    Procedures:      HPI/Subjective: No SOB, no CP  Objective: Filed Vitals:   03/19/15 0622  BP: 141/61  Pulse: 96  Temp: 98.2 F (36.8 C)  Resp: 16    Intake/Output Summary (Last 24 hours) at 03/19/15 1040 Last data filed at 03/19/15 0754  Gross per 24 hour  Intake    240 ml  Output   1025 ml  Net   -785 ml   Filed Weights   03/17/15 2354 03/18/15 0224 03/19/15 0622  Weight: 57 kg (125 lb 10.6 oz) 58 kg (127 lb 13.9 oz) 58.1 kg (128 lb 1.4 oz)    Exam:   General:  Awake, hard of hearing- oriented to person only  Cardiovascular: rrr  Respiratory: clear  Abdomen: +Bs, soft  Musculoskeletal: no edema   Data Reviewed: Basic Metabolic Panel:  Recent Labs Lab 03/17/15 1919 03/18/15 0425 03/19/15 0532  NA 141 140 139  K  3.6 3.2* 3.9  CL 101 104 107  CO2 25 27 24   GLUCOSE 108* 64* 111*  BUN 12 11 13   CREATININE 0.76 0.70 0.63  CALCIUM 9.0 9.0 9.0   Liver Function Tests:  Recent Labs Lab 03/17/15 1919 03/18/15 0425  AST 15 11*  ALT 11* 8*  ALKPHOS 73 63  BILITOT 1.0 0.9  PROT 6.1* 5.5*  ALBUMIN 3.3* 2.8*   No results for input(s): LIPASE, AMYLASE in the last 168 hours. No results for input(s): AMMONIA in the last 168 hours. CBC:  Recent Labs Lab 03/17/15 1919 03/18/15 0425 03/19/15 0532  WBC 9.1 8.1 9.5  NEUTROABS 7.1  --   --   HGB 13.8 12.9 13.0  HCT 41.6 39.4 40.1  MCV 90.0 91.0 92.0  PLT 212 193 191   Cardiac Enzymes: No results for input(s): CKTOTAL, CKMB, CKMBINDEX, TROPONINI in the last 168 hours. BNP (last 3 results) No results for input(s): BNP in the last 8760 hours.  ProBNP (last 3 results) No results for input(s): PROBNP in the last 8760 hours.  CBG:  Recent Labs Lab 03/18/15 1221 03/18/15 1702 03/18/15 2113 03/19/15 0808  GLUCAP 149* 149* 135* 125*    No results found for this or any previous visit (from the past 240 hour(s)).   Studies: X-ray Chest Pa And Lateral  03/18/2015   CLINICAL DATA:  Abdominal pain, history of aortic stenosis and CHF  EXAM: CHEST  2 VIEW  COMPARISON:  09/09/2014  FINDINGS: Aortic graft in place. Heart size mildly enlarged with aortic unfolding/ectasia. Stable prominence of the superior vascular pedicle. Diffusely prominent interstitial reticular opacities are noted. No new focal pulmonary opacity. Small left pleural effusion or thickening again noted. Remote right-sided rib deformities.  IMPRESSION: Small left pleural effusion, decreased from previously, possibly thickening/scarring.   Electronically Signed   By: Christiana Pellant M.D.   On: 03/18/2015 08:44   Ct Head Wo Contrast  03/17/2015   CLINICAL DATA:  Patient said she has frontal headaches that come and go  EXAM: CT HEAD WITHOUT CONTRAST  TECHNIQUE: Contiguous axial images  were obtained from the base of the skull through the vertex without intravenous contrast.  COMPARISON:  11/17/2013  FINDINGS: Ventricles normal configuration. There is ventricular and sulcal enlargement reflecting mild age related atrophy. There are no parenchymal masses or mass effect. There is no evidence of a recent cortical infarct. There are areas of white matter hypoattenuation consistent with mild to moderate chronic microvascular ischemic change. Several small more focal areas of white matter hypoattenuation are likely old lacune infarcts.  There are no extra-axial masses or abnormal fluid collections.  There is no intracranial hemorrhage.  Mild mucosal thickening in some dependent fluid lies in the left sphenoid sinus. Remaining visualized sinuses are clear. Clear mastoid air cells.  IMPRESSION: 1. No acute intracranial abnormalities. 2. Mild atrophy and mild to moderate chronic microvascular ischemic change.   Electronically Signed   By: Amie Portland M.D.   On: 03/17/2015 20:06   Mr Brain Wo Contrast  03/18/2015   CLINICAL DATA:  Mild cognitive impairment, congestive heart failure, aortic stenosis with history of falls, developed sudden onset of confusion. Intermittent frontal headaches.  EXAM: MRI HEAD WITHOUT CONTRAST  TECHNIQUE: Multiplanar, multiecho pulse sequences of the brain and surrounding structures were obtained without intravenous contrast.  COMPARISON:  CT head 03/17/2015.  FINDINGS: No evidence for acute infarction, hemorrhage, mass lesion, hydrocephalus, or extra-axial fluid. Mild cerebral and cerebellar atrophy. Moderately advanced subcortical and periventricular T2 and FLAIR hyperintensities, likely chronic microvascular ischemic change.  Pituitary, pineal, and cerebellar tonsils unremarkable. No upper cervical lesions. Flow voids are maintained throughout the carotid, basilar, and vertebral arteries. There are no areas of chronic hemorrhage.  Mild chronic sinus disease LEFT sphenoid  division. No sinus air-fluid levels. Negative orbits. Extracranial soft tissues otherwise unremarkable.  IMPRESSION: Age-related changes as described.  No acute intracranial findings.  Mild chronic paranasal sinus disease without significant air-fluid level.  No acute abnormality is seen which might contribute to headaches or confusion.   Electronically Signed   By: Davonna Belling M.D.   On: 03/18/2015 08:34   Ct Abdomen Pelvis W Contrast  03/18/2015   CLINICAL DATA:  Right upper quadrant abdominal pain  EXAM: CT ABDOMEN AND PELVIS WITH CONTRAST  TECHNIQUE: Multidetector CT imaging of the abdomen and pelvis was performed using the standard protocol following bolus administration of intravenous contrast.  CONTRAST:  OMNIPAQUE IOHEXOL 300 MG/ML  SOLN  COMPARISON:  09/16/2011  FINDINGS: Lower chest: Evidence of aortic and mitral valvuloplasty. Heart size upper limits of normal. Small hiatal hernia. Dependent bibasilar atelectasis.  Hepatobiliary: 5 mm too small to characterize posterior segment right hepatic lobe hypodense lesion is stable, most likely a cyst or hamartoma. Cholecystectomy clips are noted. Stable dilatation of the common duct with tapering to the ampulla, maximal diameter 1.3 cm image 29.  Pancreas: Large to adrenal diverticulum. Pancreas partly fatty replaced with duct diameter at upper limits  of normal measuring 3 mm at the level the pancreatic head. No peripancreatic fluid or mass identified.  Spleen: 4 mm subjectively fluid density splenic lesion could represent a cyst although lymphangioma or hemangioma could appear similar.  Adrenals/Urinary Tract: Bilateral renal cortical cysts are reidentified, subjectively slightly increased from previously, largest right mid kidney 0.7 cm image 31. No hydroureteronephrosis. No radiopaque renal or ureteral calculus.  Stomach/Bowel: Appendix not visualized, no secondary evidence for acute appendicitis. Colonic diverticuli noted without evidence for  diverticulitis.  Vascular/Lymphatic: Moderate atheromatous aortic calcification without aneurysm. No lymphadenopathy.  Reproductive: Uterus and ovaries presumed surgically absent, not visualized.  Other: No free air or fluid.  Musculoskeletal: Remote right inferior and superior pubic rami fractures and remote left inferior pubic ramus fracture. Streak artifact from left total hip arthroplasty. Lumbar spine disc degenerative change noted at multiple levels. Compression deformities at L2 and L4 are new since the prior exam but otherwise age indeterminate.  IMPRESSION: No acute intra-abdominal or pelvic pathology.  Age indeterminate L2 and L4 compression deformities.   Electronically Signed   By: Christiana Pellant M.D.   On: 03/18/2015 08:36    Scheduled Meds: . bisoprolol  5 mg Oral Daily  . buPROPion  75 mg Oral Q breakfast  . enoxaparin (LOVENOX) injection  40 mg Subcutaneous Daily  . feeding supplement (ENSURE ENLIVE)  237 mL Oral BID BM  . guaiFENesin  600 mg Oral BID  . insulin aspart  0-9 Units Subcutaneous TID WC  . levofloxacin  250 mg Oral q1800  . mirtazapine  30 mg Oral QHS  . Travoprost (BAK Free)  1 drop Both Eyes QHS   Continuous Infusions: . sodium chloride 75 mL/hr at 03/19/15 0108   Antibiotics Given (last 72 hours)    Date/Time Action Medication Dose   03/18/15 1813 Given   levofloxacin (LEVAQUIN) tablet 250 mg 250 mg      Active Problems:   E. coli UTI (urinary tract infection)   DM2 (diabetes mellitus, type 2)   COPD (chronic obstructive pulmonary disease)   Altered mental state   Malnutrition of moderate degree    Time spent: 25 min    Cartel Mauss  [facility] Pager [phone]. If 7PM-7AM, please contact night-coverage at [email or web address], password [facility] 03/19/2015, 10:40 AM  LOS: 2 days

## 2015-03-20 DIAGNOSIS — R410 Disorientation, unspecified: Secondary | ICD-10-CM | POA: Diagnosis not present

## 2015-03-20 DIAGNOSIS — E11649 Type 2 diabetes mellitus with hypoglycemia without coma: Secondary | ICD-10-CM | POA: Diagnosis not present

## 2015-03-20 DIAGNOSIS — E440 Moderate protein-calorie malnutrition: Secondary | ICD-10-CM | POA: Diagnosis not present

## 2015-03-20 LAB — VITAMIN B12: VITAMIN B 12: 126 pg/mL — AB (ref 180–914)

## 2015-03-20 LAB — AMMONIA: Ammonia: 23 umol/L (ref 9–35)

## 2015-03-20 LAB — GLUCOSE, CAPILLARY
GLUCOSE-CAPILLARY: 116 mg/dL — AB (ref 65–99)
GLUCOSE-CAPILLARY: 93 mg/dL (ref 65–99)
Glucose-Capillary: 106 mg/dL — ABNORMAL HIGH (ref 65–99)
Glucose-Capillary: 109 mg/dL — ABNORMAL HIGH (ref 65–99)
Glucose-Capillary: 114 mg/dL — ABNORMAL HIGH (ref 65–99)

## 2015-03-20 LAB — T4, FREE: FREE T4: 0.9 ng/dL (ref 0.61–1.12)

## 2015-03-20 MED ORDER — OLANZAPINE 5 MG PO TABS
5.0000 mg | ORAL_TABLET | Freq: Every day | ORAL | Status: DC
  Administered 2015-03-20: 5 mg via ORAL
  Filled 2015-03-20 (×3): qty 1

## 2015-03-20 NOTE — Progress Notes (Addendum)
Physical Therapy Treatment Patient Details Name: [name] MRN: [id number] DOB: 1935-01-28 Today's Date: 03/20/2015    History of Present Illness Pt adm with acute encephalopathy and aphasia. MRI neg. PMH - RA, memory deficit, CAD    PT Comments    Pt very confused today. Improving mobility but cognition worse than on eval. Currently pt will need 24 hour care due to cognition. If family unable to provide pt will need SNF.  Follow Up Recommendations  Home health PT;Supervision/Assistance - 24 hour     Equipment Recommendations  None recommended by PT    Recommendations for Other Services       Precautions / Restrictions Precautions Precautions: Fall Restrictions Weight Bearing Restrictions: No    Mobility  Bed Mobility               General bed mobility comments: Pt up in chair.  Transfers Overall transfer level: Needs assistance Equipment used: Rolling walker (2 wheeled) Transfers: Sit to/from Stand Sit to Stand: Min guard         General transfer comment: Assist for balance.  Ambulation/Gait Ambulation/Gait assistance: Supervision Ambulation Distance (Feet): 150 Feet Assistive device: 4-wheeled walker Gait Pattern/deviations: Step-through pattern;Decreased step length - right;Decreased step length - left Gait velocity: decr Gait velocity interpretation: Below normal speed for age/gender General Gait Details: Assist for balance. Improved stability as distance increased.   Stairs            Wheelchair Mobility    Modified Rankin (Stroke Patients Only)       Balance     Sitting balance-Leahy Scale: Good       Standing balance-Leahy Scale: Poor Standing balance comment: support of rollator                    Cognition Arousal/Alertness: Awake/alert Behavior During Therapy: WFL for tasks assessed/performed Overall Cognitive Status: Impaired/Different from baseline Area of Impairment:  Orientation;Attention;Memory;Safety/judgement Orientation Level: Disoriented to;Place;Time;Situation Current Attention Level: Sustained Memory: Decreased recall of precautions;Decreased short-term memory   Safety/Judgement: Decreased awareness of deficits          Exercises      General Comments        Pertinent Vitals/Pain Pain Assessment: No/denies pain Faces Pain Scale: Hurts little more Pain Location: L shoulder with ROM Pain Descriptors / Indicators: Aching Pain Intervention(s): Repositioned    Home Living Family/patient expects to be discharged to:: Private residence Living Arrangements: Alone Available Help at Discharge: Family;Available 24 hours/day (if needed) Type of Home: Apartment Home Access: Stairs to enter   Home Layout: One level Home Equipment: Walker - 2 wheels;Walker - 4 wheels Additional Comments: During past admission daughter stated she could stay with pt after DC if needed.    Prior Function Level of Independence: Independent with assistive device(s)      Comments: Uses cane or walker. no family present to provide information about home setup. pt confused.   PT Goals (current goals can now be found in the care plan section) Acute Rehab PT Goals Patient Stated Goal: none stated. PT Goal Formulation: With patient Time For Goal Achievement: 03/25/15 Potential to Achieve Goals: Good    Frequency  Min 3X/week    PT Plan Discharge plan needs to be updated    Co-evaluation             End of Session Equipment Utilized During Treatment: Gait belt Activity Tolerance: Patient tolerated treatment well Patient left: in chair;with call bell/phone within reach;with chair alarm set  Time: 3785-8850 PT Time Calculation (min) (ACUTE ONLY): 16 min  Charges:  $Gait Training: 8-22 mins                    G Codes:      Cloe Sockwell 04/11/2015, 11:30 AM  [facility] PT [phone]

## 2015-03-20 NOTE — Progress Notes (Addendum)
PROGRESS NOTE  [name] DHW:[id number] DOB: 05-Jun-1935 DOA: 03/17/2015 PCP: [name], MD  Assessment/Plan: Acute encephalopathy with expressive aphasia ?UTI vs dehydration with superimposed delerium - CT scan WNL - MRI brain requested to rule out CVA and was negative, no further stroke workup recommended since MRI negative  Nausea and vomiting - Has not had any episodes of vomiting while in the emergency department - tolerating diet - Provide antiemetics as needed  Questionable UTI - Again urinalysis suggestive of UTI but patient noninfectious appearing - Urine culture requested, will place on empiric Levaquin for now and readjust the regimen as clinically indicated  Nonproductive cough - pleural effusion but improved  Combined systolic and diastolic CHF, chronic - Last 2-D echo November 2015 with EF 45% and grade 1 diastolic CHF - IV fluids for now as mild crackles noted at lung bases with scattered rhonchi - Monitor daily weights, weight on admission 142 lbs - Strict I/O  Hypertension - Continue home regimen with bisoprolol  Hypokalemia -replace  Code Status: full Family Communication: daughter Disposition Plan: PT eval   Consultants:    Procedures:      HPI/Subjective: Feeling well but not sleeping  Objective: Filed Vitals:   03/20/15 1420  BP: 128/62  Pulse: 78  Temp: 98 F (36.7 C)  Resp:     Intake/Output Summary (Last 24 hours) at 03/20/15 1505 Last data filed at 03/20/15 0502  Gross per 24 hour  Intake 1133.75 ml  Output    350 ml  Net 783.75 ml   Filed Weights   03/18/15 0224 03/19/15 0622 03/20/15 0605  Weight: 58 kg (127 lb 13.9 oz) 58.1 kg (128 lb 1.4 oz) 60.9 kg (134 lb 4.2 oz)    Exam:   General:  Awake, hard of hearing- oriented to person only  Cardiovascular: rrr  Respiratory: clear  Abdomen: +Bs, soft  Musculoskeletal: no edema   Data Reviewed: Basic Metabolic Panel:  Recent Labs Lab  03/17/15 1919 03/18/15 0425 03/19/15 0532  NA 141 140 139  K 3.6 3.2* 3.9  CL 101 104 107  CO2 25 27 24   GLUCOSE 108* 64* 111*  BUN 12 11 13   CREATININE 0.76 0.70 0.63  CALCIUM 9.0 9.0 9.0   Liver Function Tests:  Recent Labs Lab 03/17/15 1919 03/18/15 0425  AST 15 11*  ALT 11* 8*  ALKPHOS 73 63  BILITOT 1.0 0.9  PROT 6.1* 5.5*  ALBUMIN 3.3* 2.8*   No results for input(s): LIPASE, AMYLASE in the last 168 hours. No results for input(s): AMMONIA in the last 168 hours. CBC:  Recent Labs Lab 03/17/15 1919 03/18/15 0425 03/19/15 0532  WBC 9.1 8.1 9.5  NEUTROABS 7.1  --   --   HGB 13.8 12.9 13.0  HCT 41.6 39.4 40.1  MCV 90.0 91.0 92.0  PLT 212 193 191   Cardiac Enzymes: No results for input(s): CKTOTAL, CKMB, CKMBINDEX, TROPONINI in the last 168 hours. BNP (last 3 results) No results for input(s): BNP in the last 8760 hours.  ProBNP (last 3 results) No results for input(s): PROBNP in the last 8760 hours.  CBG:  Recent Labs Lab 03/19/15 1134 03/19/15 1713 03/20/15 0017 03/20/15 0745 03/20/15 1147  GLUCAP 97 100* 106* 109* 114*    Recent Results (from the past 240 hour(s))  Urine culture     Status: None   Collection Time: 03/17/15  7:43 PM  Result Value Ref Range Status   Specimen Description URINE, CLEAN CATCH  Final  Special Requests NONE  Final   Colony Count   Final    >=100,000 COLONIES/ML Performed at [facility]    Culture   Final    Multiple bacterial morphotypes present, none predominant. Suggest appropriate recollection if clinically indicated. Performed at [facility]    Report Status 03/19/2015 FINAL  Final     Studies: No results found.  Scheduled Meds: . bisoprolol  5 mg Oral Daily  . buPROPion  75 mg Oral Q breakfast  . enoxaparin (LOVENOX) injection  40 mg Subcutaneous Daily  . feeding supplement (ENSURE ENLIVE)  237 mL Oral BID BM  . guaiFENesin  600 mg Oral BID  . insulin aspart  0-9 Units  Subcutaneous TID WC  . mirtazapine  30 mg Oral QHS  . OLANZapine  5 mg Oral QHS  . Travoprost (BAK Free)  1 drop Both Eyes QHS   Continuous Infusions:   Antibiotics Given (last 72 hours)    Date/Time Action Medication Dose   03/18/15 1813 Given   levofloxacin (LEVAQUIN) tablet 250 mg 250 mg   03/19/15 1756 Given   levofloxacin (LEVAQUIN) tablet 250 mg 250 mg      Active Problems:   E. coli UTI (urinary tract infection)   DM2 (diabetes mellitus, type 2)   COPD (chronic obstructive pulmonary disease)   Altered mental state   Malnutrition of moderate degree    Time spent: 25 min    Atlas Kuc  [facility] Pager [phone]. If 7PM-7AM, please contact night-coverage at [email or web address], password [facility] 03/20/2015, 3:05 PM  LOS: 3 days
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
## 2015-03-21 DIAGNOSIS — E11649 Type 2 diabetes mellitus with hypoglycemia without coma: Secondary | ICD-10-CM | POA: Diagnosis not present

## 2015-03-21 DIAGNOSIS — E440 Moderate protein-calorie malnutrition: Secondary | ICD-10-CM | POA: Diagnosis not present

## 2015-03-21 DIAGNOSIS — R410 Disorientation, unspecified: Secondary | ICD-10-CM | POA: Diagnosis not present

## 2015-03-21 DIAGNOSIS — R4182 Altered mental status, unspecified: Secondary | ICD-10-CM

## 2015-03-21 DIAGNOSIS — F332 Major depressive disorder, recurrent severe without psychotic features: Secondary | ICD-10-CM

## 2015-03-21 LAB — URINALYSIS, ROUTINE W REFLEX MICROSCOPIC
Bilirubin Urine: NEGATIVE
GLUCOSE, UA: NEGATIVE mg/dL
HGB URINE DIPSTICK: NEGATIVE
Ketones, ur: NEGATIVE mg/dL
LEUKOCYTES UA: NEGATIVE
Nitrite: NEGATIVE
PH: 6 (ref 5.0–8.0)
Protein, ur: NEGATIVE mg/dL
Specific Gravity, Urine: 1.008 (ref 1.005–1.030)
Urobilinogen, UA: 0.2 mg/dL (ref 0.0–1.0)

## 2015-03-21 LAB — GLUCOSE, CAPILLARY
Glucose-Capillary: 97 mg/dL (ref 65–99)
Glucose-Capillary: 112 mg/dL — ABNORMAL HIGH (ref 65–99)
Glucose-Capillary: 127 mg/dL — ABNORMAL HIGH (ref 65–99)
Glucose-Capillary: 138 mg/dL — ABNORMAL HIGH (ref 65–99)

## 2015-03-21 LAB — HIV ANTIBODY (ROUTINE TESTING W REFLEX): HIV Screen 4th Generation wRfx: NONREACTIVE

## 2015-03-21 LAB — HEMOGLOBIN A1C
Hgb A1c MFr Bld: 6.2 % — ABNORMAL HIGH (ref 4.8–5.6)
MEAN PLASMA GLUCOSE: 131 mg/dL

## 2015-03-21 MED ORDER — BUPROPION HCL 100 MG PO TABS
100.0000 mg | ORAL_TABLET | Freq: Every day | ORAL | Status: DC
  Administered 2015-03-22: 100 mg via ORAL
  Filled 2015-03-21 (×2): qty 1

## 2015-03-21 MED ORDER — MIRTAZAPINE 15 MG PO TABS
15.0000 mg | ORAL_TABLET | Freq: Every day | ORAL | Status: DC
  Administered 2015-03-21: 15 mg via ORAL
  Filled 2015-03-21 (×2): qty 1

## 2015-03-21 MED ORDER — ALPRAZOLAM 0.5 MG PO TABS: 0.5000 mg | ORAL_TABLET | Freq: Three times a day (TID) | ORAL | Status: DC | PRN

## 2015-03-21 MED ORDER — CYANOCOBALAMIN 1000 MCG/ML IJ SOLN
1000.0000 ug | Freq: Every day | INTRAMUSCULAR | Status: DC
  Administered 2015-03-21 – 2015-03-22 (×2): 1000 ug via INTRAMUSCULAR
  Filled 2015-03-21 (×2): qty 1
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
## 2015-03-21 NOTE — Progress Notes (Signed)
Physical Therapy Treatment Patient Details Name: [name] MRN: [id number] DOB: Nov 30, 1934 Today's Date: 03/21/2015    History of Present Illness Pt adm with acute encephalopathy and aphasia. MRI neg. PMH - RA, memory deficit, CAD    PT Comments    Patient is progressing well and more appropriate this session even thought she has trouble staying focused on task and staying on conversation. Continue to recommend HHPT for following once discharged.   Follow Up Recommendations  Home health PT;Supervision/Assistance - 24 hour     Equipment Recommendations  None recommended by PT    Recommendations for Other Services       Precautions / Restrictions Precautions Precautions: Fall    Mobility  Bed Mobility Overal bed mobility: Needs Assistance Bed Mobility: Supine to Sit     Supine to sit: Min guard     General bed mobility comments: [place] for safety. use of rails and HOB elevated  Transfers Overall transfer level: Needs assistance Equipment used: Rolling walker (2 wheeled)   Sit to Stand: Min guard         General transfer comment: Cues for safety and not to pull up on RW  Ambulation/Gait Ambulation/Gait assistance: Supervision Ambulation Distance (Feet): 200 Feet Assistive device: Rolling walker (2 wheeled) Gait Pattern/deviations: Step-through pattern;Decreased stride length Gait velocity: decr   General Gait Details: Cues for safety with RW managment   Stairs            Wheelchair Mobility    Modified Rankin (Stroke Patients Only)       Balance     Sitting balance-Leahy Scale: Good       Standing balance-Leahy Scale: Poor Standing balance comment: support of RW required                    Cognition Arousal/Alertness: Awake/alert Behavior During Therapy: WFL for tasks assessed/performed Overall Cognitive Status: Impaired/Different from baseline Area of Impairment:  Orientation;Attention;Memory;Safety/judgement Orientation Level: Disoriented to;Situation Current Attention Level: Sustained     Safety/Judgement: Decreased awareness of deficits          Exercises      General Comments        Pertinent Vitals/Pain Pain Assessment: No/denies pain Pain Score: 3  Pain Location: L shoulder  Pain Descriptors / Indicators: Aching Pain Intervention(s): Monitored during session    Home Living                      Prior Function            PT Goals (current goals can now be found in the care plan section) Progress towards PT goals: Progressing toward goals    Frequency  Min 3X/week    PT Plan Current plan remains appropriate    Co-evaluation             End of Session Equipment Utilized During Treatment: Gait belt Activity Tolerance: Patient tolerated treatment well Patient left: in bed;with call bell/phone within reach;with bed alarm set     Time: 1412-1435 PT Time Calculation (min) (ACUTE ONLY): 23 min  Charges:  $Gait Training: 8-22 mins $Therapeutic Activity: 8-22 mins                    G Codes:      Fredrich Birks 03/21/2015, 2:43 PM 03/21/2015 Fredrich Birks PTA [phone] pager [phone] office

## 2015-03-21 NOTE — Consult Note (Signed)
[facility] Face-to-Face Psychiatry Consult   Reason for Consult:  Altered mental status  Referring Physician:  Dr. Benjamine Mola Patient Identification: [name] MRN:  [id number] Principal Diagnosis: Altered mental state, Maj. depressive disorder, recurrent Diagnosis:   Patient Active Problem List   Diagnosis Date Noted  . Malnutrition of moderate degree [E44.0] 03/18/2015  . Altered mental state [R41.82] 03/17/2015  . Metabolic encephalopathy [G93.41] 09/12/2014    Class: Acute  . Sepsis secondary to UTI [A41.9, N39.0] 09/09/2014  . S/P TAVR (transcatheter aortic valve replacement) [Z95.4] 03/16/2014  . Left rib fracture [S22.32XA] 11/17/2013  . Closed pelvic fracture [S32.9XXA] 11/17/2013  . Sepsis [A41.9] 11/10/2013  . UTI (lower urinary tract infection) [N39.0] 11/09/2013  . Sepsis syndrome [IMO0002] 09/17/2013  . COPD (chronic obstructive pulmonary disease) [J44.9] 07/20/2013  . Chest pain [R07.9] 06/29/2013  . Hearing loss [H91.90] 03/25/2013  . Memory deficit [R41.3] 03/25/2013  . Depression [F32.9] 03/25/2013  . Syncope [R55] 03/25/2013  . Pulmonary fibrosis [J84.10]   . Aortic stenosis, severe [I35.0] 02/26/2013  . Influenza A [J10.1] 11/12/2012    Class: Acute  . Pancreatic insufficiency [K86.8] 11/09/2012    Class: Chronic  . Low back pain [M54.5] 01/16/2012    Class: Acute  . Gait instability [R26.81] 01/16/2012    Class: Acute  . Sinusitis, acute, sphenoidal [J01.30] 11/17/2011    Class: Acute  . Pneumonia [J18.9] 11/12/2011    Class: Acute  . Adrenal insufficiency, primary, iatrogenic [E27.49] 11/12/2011    Class: Chronic  . E. coli UTI (urinary tract infection) [N39.0, B96.20] 10/01/2011  . Fever [R50.9] 10/01/2011  . Cough [R05] 10/01/2011  . Failure to thrive in adult [R62.7] 10/01/2011  . Weakness generalized [R53.1] 10/01/2011  . Rheumatoid arthritis involving multiple joints [M06.9] 10/01/2011  . DM2 (diabetes mellitus, type 2) [E11.9] 10/01/2011    Total  Time spent with patient: 1 hour  Subjective:   [name] is a 80 y.o. female patient admitted with delirium with delusions.  HPI: [name] is a 80 years old female seen for the face to face psychiatric consultation and evaluation for altered mental status. Reportedly patient was found with the sudden onset of confusion by family members prior to the admission. Patient has no complaints today but stated she has been confused and then she was told she has been suffering with recurrent urinary tract infection which might be cause for her confusion and altered mental status. Patient stated she has been living with her daughter [name] and her boyfriend. Patient has denied any symptoms of depression, anxiety, psychosis. She has no suicidal or homicidal ideation and there is no evidence of hallucinations, delusions or paranoia. Patient has history of depression and has been taking medication management from primary care physician. Patient denied previous history of acute psychiatric hospitalizations.  Spoke with the patient son [name] who stated that patient has been confused and talking about some older memories and also something was not happening in the recent past. Reportedly she has been confused daily single times he had urinary tract infection and she becomes normal once infection has been under control. I will unable to reach patient daughter [name] during this this time.  Medical history: Patient is 80 year old female with multiple and complex medical history including mild cognitive impairment, chronic systolic and diastolic CHF, recurrent UTIs, CAD, aortic stenosis with history of falls, presented to [facility] emergency department with main concern off sudden onset of confusion noted by family members. Please note that patient unable to  provide detailed history during the admission secondary to some confusion but is able to answer some basic questions. Family reports patient was in her  usual state of health and earlier in the afternoon started to talk with no sense. In addition family reported several episodes of nonbloody vomiting. Patient explains she has had intermittent episodes of right upper quadrant pain that has been sharp and throbbing at times, 3 out of 10 in severity when present, occasionally but not consistently radiating to the back, no specific aggravating or alleviating factors. Patient also explains several days duration of nonproductive cough, worse when laying down flat. Patient denies fevers or chills, reports history of recurrent UTIs but denies any specific urinary concerns such as urinary urgency, dysuria, frequency, hematuria.  In emergency department, patient noted to be hemodynamically stable, vital signs stable, blood work unremarkable, urinalysis suggestive of UTI. Patient alert and oriented to name, place, year (not oriented to month), demonstrating intermittent confusion with random speech. CT head unremarkable. TRH asked to admit for further evaluation.  HPI Elements:   Location:  Altered mental status. Quality:  Fair to poor. Severity:  Acute to chronic. Timing:  Frequent urinary tract infection. Duration:  Few days. Context:  Multiple medical problems and complicated urinary tract infection.  Past Medical History:  Past Medical History  Diagnosis Date  . Osteoporosis   . Rheumatoid arthritis(714.0)   . Murmur, cardiac   . Pneumonia   . Anxiety   . Adrenal insufficiency, primary, iatrogenic   . Gait instability   . Physical deconditioning   . SOBOE (shortness of breath on exertion)     echo 02/23/13-EF 55-60%, possible bicuspid aortic valve with mod to severed stenosis   . Palpitations   . Chest pain     myoview 04/13/04-inferoapical thinning with a suggestion of mild apical ischemia, EF 66%  . Syncope   . Aortic stenosis, severe 02/26/2013  . Pancreatic insufficiency 11/09/2012  . UTI (lower urinary tract infection) 10/01/2011  . Hearing  loss 03/25/2013  . Memory deficit 03/25/2013  . Depression 03/25/2013  . Pulmonary fibrosis 03/25/2013  . Coronary artery disease   . CHF (congestive heart failure)   . Diabetes mellitus without complication     Past Surgical History  Procedure Laterality Date  . Knee arthroscopy      2 on left  . Cholecystectomy    . Thyroidectomy    . Abdominal hysterectomy    . Cardiac catheterization  11/08/96    no evidence of CAD, nl LV function  . Shoulder surgery Left   . Right heart catheterization  03/22/2013    Procedure: RIGHT HEART CATH;  Surgeon: [name], MD;  Location: [facility] CATH LAB;  Service: Cardiovascular;;  . Coronary angiogram  03/22/2013    Procedure: CORONARY ANGIOGRAM;  Surgeon: [name], MD;  Location: [facility] CATH LAB;  Service: Cardiovascular;;   Family History:  Family History  Problem Relation Age of Onset  . Stroke Brother   . Cancer - Colon Father   . Cancer Sister     spine?   Social History:  History  Alcohol Use No     History  Drug Use No    History   Social History  . Marital Status: Widowed    Spouse Name: N/A  . Number of Children: 3  . Years of Education: N/A   Occupational History  . retired     Technical sales engineer of [place]   Social History Main Topics  . Smoking status:  Never Smoker   . Smokeless tobacco: Never Used  . Alcohol Use: No  . Drug Use: No  . Sexual Activity: No   Other Topics Concern  . None   Social History Narrative   Additional Social History:                          Allergies:   Allergies  Allergen Reactions  . Penicillins Hives    Labs:  Results for orders placed or performed during the hospital encounter of 03/17/15 (from the past 48 hour(s))  Glucose, capillary     Status: None   Collection Time: 03/19/15 11:34 AM  Result Value Ref Range   Glucose-Capillary 97 65 - 99 mg/dL  Glucose, capillary     Status: Abnormal   Collection Time: 03/19/15  5:13 PM  Result Value Ref Range   Glucose-Capillary 100  (H) 65 - 99 mg/dL  Glucose, capillary     Status: Abnormal   Collection Time: 03/20/15 12:17 AM  Result Value Ref Range   Glucose-Capillary 106 (H) 65 - 99 mg/dL   Comment 1 Notify RN    Comment 2 Document in Chart   Glucose, capillary     Status: Abnormal   Collection Time: 03/20/15  7:45 AM  Result Value Ref Range   Glucose-Capillary 109 (H) 65 - 99 mg/dL  Glucose, capillary     Status: Abnormal   Collection Time: 03/20/15 11:47 AM  Result Value Ref Range   Glucose-Capillary 114 (H) 65 - 99 mg/dL  T4, free     Status: None   Collection Time: 03/20/15 11:47 AM  Result Value Ref Range   Free T4 0.90 0.61 - 1.12 ng/dL  Vitamin G25     Status: Abnormal   Collection Time: 03/20/15  1:37 PM  Result Value Ref Range   Vitamin B-12 126 (L) 180 - 914 pg/mL    Comment: (NOTE) This assay is not validated for testing neonatal or myeloproliferative syndrome specimens for Vitamin B12 levels.   Ammonia     Status: None   Collection Time: 03/20/15  3:45 PM  Result Value Ref Range   Ammonia 23 9 - 35 umol/L  Glucose, capillary     Status: Abnormal   Collection Time: 03/20/15  4:39 PM  Result Value Ref Range   Glucose-Capillary 116 (H) 65 - 99 mg/dL  Glucose, capillary     Status: None   Collection Time: 03/20/15  9:01 PM  Result Value Ref Range   Glucose-Capillary 93 65 - 99 mg/dL   Comment 1 Notify RN   Urinalysis, Routine w reflex microscopic (not at [facility])     Status: None   Collection Time: 03/21/15  2:36 AM  Result Value Ref Range   Color, Urine YELLOW YELLOW   APPearance CLEAR CLEAR   Specific Gravity, Urine 1.008 1.005 - 1.030   pH 6.0 5.0 - 8.0   Glucose, UA NEGATIVE NEGATIVE mg/dL   Hgb urine dipstick NEGATIVE NEGATIVE   Bilirubin Urine NEGATIVE NEGATIVE   Ketones, ur NEGATIVE NEGATIVE mg/dL   Protein, ur NEGATIVE NEGATIVE mg/dL   Urobilinogen, UA 0.2 0.0 - 1.0 mg/dL   Nitrite NEGATIVE NEGATIVE   Leukocytes, UA NEGATIVE NEGATIVE    Comment: MICROSCOPIC NOT DONE ON  URINES WITH NEGATIVE PROTEIN, BLOOD, LEUKOCYTES, NITRITE, OR GLUCOSE <1000 mg/dL.  Glucose, capillary     Status: None   Collection Time: 03/21/15  7:46 AM  Result Value Ref Range  Glucose-Capillary 97 65 - 99 mg/dL    Vitals: Blood pressure 138/62, pulse 88, temperature 98.7 F (37.1 C), temperature source Oral, resp. rate 18, height 5' (1.524 m), weight 56.654 kg (124 lb 14.4 oz), SpO2 96 %.  Risk to Self: Is patient at risk for suicide?: No Risk to Others:   Prior Inpatient Therapy:   Prior Outpatient Therapy:    Current Facility-Administered Medications  Medication Dose Route Frequency Provider Last Rate Last Dose  . acetaminophen (TYLENOL) tablet 650 mg  650 mg Oral Q6H PRN Joseph Art, DO   650 mg at 03/20/15 1210  . albuterol (PROVENTIL) (2.5 MG/3ML) 0.083% nebulizer solution 2.5 mg  2.5 mg Nebulization Q2H PRN Dorothea Ogle, MD      . ALPRAZolam Prudy Feeler) tablet 1 mg  1 mg Oral TID PRN Dorothea Ogle, MD   1 mg at 03/20/15 2100  . bisoprolol (ZEBETA) tablet 5 mg  5 mg Oral Daily Dorothea Ogle, MD   5 mg at 03/20/15 1037  . buPROPion [facility]) tablet 75 mg  75 mg Oral Q breakfast Dorothea Ogle, MD   75 mg at 03/21/15 0835  . enoxaparin (LOVENOX) injection 40 mg  40 mg Subcutaneous Daily Dorothea Ogle, MD   40 mg at 03/20/15 1037  . feeding supplement (ENSURE ENLIVE) (ENSURE ENLIVE) liquid 237 mL  237 mL Oral BID BM Dorothea Ogle, MD   237 mL at 03/18/15 1813  . guaiFENesin (MUCINEX) 12 hr tablet 600 mg  600 mg Oral BID Dorothea Ogle, MD   600 mg at 03/20/15 2100  . guaiFENesin-dextromethorphan (ROBITUSSIN DM) 100-10 MG/5ML syrup 5 mL  5 mL Oral Q4H PRN Dorothea Ogle, MD      . insulin aspart (novoLOG) injection 0-9 Units  0-9 Units Subcutaneous TID WC Joseph Art, DO   1 Units at 03/19/15 [phone]  . mirtazapine (REMERON) tablet 30 mg  30 mg Oral QHS Dorothea Ogle, MD   30 mg at 03/20/15 2100  . OLANZapine (ZYPREXA) tablet 5 mg  5 mg Oral QHS Joseph Art, DO   5 mg at  03/20/15 2358  . ondansetron (ZOFRAN) tablet 4 mg  4 mg Oral Q6H PRN Dorothea Ogle, MD       Or  . ondansetron [facility]) injection 4 mg  4 mg Intravenous Q6H PRN Dorothea Ogle, MD   4 mg at 03/20/15 0250  . Travoprost (BAK Free) (TRAVATAN) 0.004 % ophthalmic solution SOLN 1 drop  1 drop Both Eyes QHS Dorothea Ogle, MD   1 drop at 03/20/15 2100    Musculoskeletal: Strength & Muscle Tone: within normal limits Gait & Station: unable to stand Patient leans: N/A  Psychiatric Specialty Exam: Physical Exam  ROS patient is pleasantly confused at this time. Patient denied nausea vomiting diarrhea, chest pain and shortness of breath.  Change No Fever-chills, No Headache, No changes with Vision or hearing, reports vertigo No problems swallowing food or Liquids, No Chest pain, Cough or Shortness of Breath, No Abdominal pain, No Nausea or Vommitting, Bowel movements are regular, No Blood in stool or Urine, No dysuria, No new skin rashes or bruises, No new joints pains-aches,  No new weakness, tingling, numbness in any extremity, No recent weight gain or loss, No polyuria, polydypsia or polyphagia,  A full 10 point Review of Systems was done, except as stated above, all other Review of Systems were negative.  Blood pressure 138/62, pulse 88,  temperature 98.7 F (37.1 C), temperature source Oral, resp. rate 18, height 5' (1.524 m), weight 56.654 kg (124 lb 14.4 oz), SpO2 96 %.Body mass index is 24.39 kg/(m^2).  General Appearance: Casual  Eye Contact::  Good  Speech:  Clear and Coherent  Volume:  Normal  Mood:  Anxious  Affect:  Appropriate and Congruent  Thought Process:  Coherent, Goal Directed and Loose  Orientation:  Full (Time, Place, and Person)  Thought Content:  Rumination  Suicidal Thoughts:  No  Homicidal Thoughts:  No  Memory:  Immediate;   Fair Recent;   Fair  Judgement:  Intact  Insight:  Fair  Psychomotor Activity:  Decreased  Concentration:  Fair  Recall:  Good  Fund  of Knowledge:Good  Language: Good  Akathisia:  Negative  Handed:  Right  AIMS (if indicated):     Assets:  Communication Skills Desire for Improvement Financial Resources/Insurance Housing Intimacy Leisure Time Resilience Social Support Transportation  ADL's:  Intact  Cognition: Impaired,  Mild  Sleep:      Medical Decision Making: Review of Psycho-Social Stressors (1), Review or order clinical lab tests (1), New Problem, with no additional work-up planned (3), Review of Medication Regimen & Side Effects (2) and Review of New Medication or Change in Dosage (2)  Treatment Plan Summary: Patient presented with altered mental status secondary to recurrent urinary tract infection. Patient has been receiving medications for the depression and insomnia from primary care physician but she never had a mental health admission. Patient has been confused about her past memories which are mixing with the new memories. Patient has no suicidal or homicidal ideation and no evidence of psychosis. Daily contact with patient to assess and evaluate symptoms and progress in treatment and Medication management  Plan:  Delirium seems to be slowly clearing up with the treatment of urinary tract infections Avoid larger dose of benzodiazepine and opiates. Depression: increase Wellbutrin 100 mg daily Anxiety: Decreased to Alprazolam 0.5 mg 3 times daily as needed to prevent confusion Insomnia Remeron 15 mg at bedtime which is better for insomnia and mood  Discontinue Zyprexa as patient has no psychosis, agitation or aggressive behavior Patient does not meet criteria for psychiatric inpatient admission. Supportive therapy provided about ongoing stressors. Appreciate psychiatric consultation and follow up as clinically required Please contact [phone] or [phone] if needs further assistance  Disposition: Patient will be referred to the outpatient medication management and medically cleared. Patient has a  supportive family  Lahna Nath,JANARDHAHA R. 03/21/2015 10:40 AM

## 2015-03-22 DIAGNOSIS — E538 Deficiency of other specified B group vitamins: Secondary | ICD-10-CM | POA: Diagnosis not present

## 2015-03-22 LAB — RPR: RPR: NONREACTIVE

## 2015-03-22 LAB — GLUCOSE, CAPILLARY
Glucose-Capillary: 114 mg/dL — ABNORMAL HIGH (ref 65–99)
Glucose-Capillary: 141 mg/dL — ABNORMAL HIGH (ref 65–99)
Glucose-Capillary: 145 mg/dL — ABNORMAL HIGH (ref 65–99)

## 2015-03-22 MED ORDER — ALPRAZOLAM 0.5 MG PO TABS: 1.0000 mg | ORAL_TABLET | Freq: Three times a day (TID) | ORAL | Status: DC | PRN

## 2015-03-22 MED ORDER — BUPROPION HCL 100 MG PO TABS: 100.0000 mg | ORAL_TABLET | ORAL | Status: DC

## 2015-03-22 MED ORDER — VITAMIN B-12 1000 MCG PO TABS: 1000.0000 ug | ORAL_TABLET | Freq: Every day | ORAL | Status: AC

## 2015-03-22 NOTE — Progress Notes (Signed)
[name] with [facility] notified of HH orders and discharge today.

## 2015-03-22 NOTE — Progress Notes (Signed)
CC44 paper given to and explained to patient.

## 2015-03-22 NOTE — Progress Notes (Signed)
Occupational Therapy Treatment Patient Details Name: [name] MRN: [id number] DOB: 1934/11/25 Today's Date: 03/22/2015    History of present illness Pt adm with acute encephalopathy and aphasia. MRI neg. PMH - RA, memory deficit, CAD   OT comments  Pt is progressing and more alert and oriented today. She continues to present with difficulties with expressive communication, but is able to circumlocute in order to express herself. Addressed safety situations once home as well as fall prevention. Acute OT to continue with POC.     Follow Up Recommendations  Home health OT;Supervision/Assistance - 24 hour    Equipment Recommendations  None recommended by OT    Recommendations for Other Services      Precautions / Restrictions Precautions Precautions: Fall Restrictions Weight Bearing Restrictions: No       Mobility Bed Mobility Overal bed mobility: Modified Independent                Transfers Overall transfer level: Needs assistance Equipment used: Rolling walker (2 wheeled) Transfers: Sit to/from Stand Sit to Stand: Min guard         General transfer comment: Min guard for safety and VC's        ADL Overall ADL's : Needs assistance/impaired     Grooming: Wash/dry hands;Set up;Sitting       Lower Body Bathing: Minimal assistance;Sit to/from stand       Lower Body Dressing: Minimal assistance;Sit to/from stand   Toilet Transfer: Min guard;Ambulation;RW             General ADL Comments: Pt very talkative but relaying current information. Able to state that she is at [facility] in [place].                 Cognition   Behavior During Therapy: WFL for tasks assessed/performed Overall Cognitive Status: Impaired/Different from baseline Area of Impairment: Attention;Safety/judgement   Current Attention Level: Sustained      Safety/Judgement: Decreased awareness of deficits                       Pertinent Vitals/ Pain        Pain Assessment: No/denies pain         Frequency Min 2X/week     Progress Toward Goals  OT Goals(current goals can now be found in the care plan section)  Progress towards OT goals: Progressing toward goals     Plan Discharge plan remains appropriate       End of Session Equipment Utilized During Treatment: Rolling walker   Activity Tolerance Patient tolerated treatment well   Patient Left in bed;with call bell/phone within reach;with bed alarm set   Nurse Communication      Functional Assessment Tool Used: clinical judgement Functional Limitation: Self care Self Care Current Status (K5397): At least 1 percent but less than 20 percent impaired, limited or restricted Self Care Goal Status (Q7341): 0 percent impaired, limited or restricted   Time: 9379-0240 OT Time Calculation (min): 31 min  Charges: OT G-codes **NOT FOR INPATIENT CLASS** Functional Assessment Tool Used: clinical judgement Functional Limitation: Self care Self Care Current Status (X7353): At least 1 percent but less than 20 percent impaired, limited or restricted Self Care Goal Status (G9924): 0 percent impaired, limited or restricted OT General Charges $OT Visit: 1 Procedure OT Treatments $Self Care/Home Management : 23-37 mins  Rae Lips 03/22/2015, 5:53 PM  Yehuda Mao Guido Sander, OTR/L Occupational Therapist [phone] (pager)

## 2015-03-22 NOTE — Discharge Summary (Signed)
[facility]  Physician Discharge Summary   Patient ID: [name] MRN: [id number] DOB/AGE: May 06, 1935 80 y.o.  Admit date: 03/17/2015 Discharge date: 03/22/2015  PCP: [name], MD  DISCHARGE DIAGNOSES:  Principal Problem:   Altered mental state Active Problems:   E. coli UTI (urinary tract infection)   DM2 (diabetes mellitus, type 2)   COPD (chronic obstructive pulmonary disease)   Malnutrition of moderate degree   B12 deficiency   RECOMMENDATIONS FOR OUTPATIENT FOLLOW UP: 1. Home health has been arranged 2. On vitamin B-12 supplementation for B12 deficiency. Please repeat B-12 levels in the next 2-3 weeks. Unfortunately, folic acid level was not checked. Please check this at follow-up.   DISCHARGE CONDITION: fair  Diet recommendation: Modified carbohydrate  Filed Weights   03/20/15 0605 03/21/15 0500 03/22/15 0555  Weight: 60.9 kg (134 lb 4.2 oz) 56.654 kg (124 lb 14.4 oz) 57.7 kg (127 lb 3.3 oz)    INITIAL HISTORY: Patient is 80 year old female with multiple and complex medical history including mild cognitive impairment, chronic systolic and diastolic CHF, recurrent UTIs, CAD, aortic stenosis with history of falls, presented to [facility] emergency department with main concern off sudden onset of confusion noted by family members. Patient continued to wax and wane in mentation. Only possible cause was low B12.  Consultations:  Psychiatry  HOSPITAL COURSE:   Acute encephalopathy  Etiology remains clear. Patient underwent extensive evaluation in the hospital including MRI brain which was negative for acute stroke. She was also seen by psychiatry who adjusted her home medications. HIV ammonia were all negative. She was found to be deficient in vitamin B-12. This could be the reason that it is also possible that patient might be experiencing worsening cognitive impairment.  Vitamin B-12 deficiency She was given parenteral B-12 in the hospital.  She'll be discharged on high-dose oral B-12.  Nausea and vomiting No further episodes. Tolerating diet  Questionable UTI Urinalysis suggestive of UTI but Urine culture with multiple colonies. She was initially started on antibiotics, which have been discontinued.  Nonproductive cough Chest x-ray revealed small pleural effusion but improved him before. No infiltrates.  Combined systolic and diastolic CHF, chronic Last 2-D echo November 2015 with EF 45% and grade 1 diastolic CHF. Well compensated.  Essential Hypertension Continue home regimen with bisoprolol  Hypokalemia Replaced  Patient's mental status has improved though she still remains distracted at times. This was discussed in detail with the patient's daughter. Have explained to her that if this confusion is indeed due to deficiency of vitamin B-12, it'll take a few weeks for her to show any kind of improvement. Patient was seen by physical therapy and occupational therapy. Would recommend home health with supervision. This was also discussed with the daughter. She states that she will be able to stay with her mother for the next few days. This will give home health an opportunity to evaluate her in the home environment. Patient also not keen going to SNF at this time. She can be discharged home with home health.  PERTINENT LABS:  The results of significant diagnostics from this hospitalization (including imaging, microbiology, ancillary and laboratory) are listed below for reference.    Microbiology: Recent Results (from the past 240 hour(s))  Urine culture     Status: None   Collection Time: 03/17/15  7:43 PM  Result Value Ref Range Status   Specimen Description URINE, CLEAN CATCH  Final   Special Requests NONE  Final   Colony Count   Final    >=  100,000 COLONIES/ML Performed at [facility]   Final    Multiple bacterial morphotypes present, none predominant. Suggest appropriate recollection if  clinically indicated. Performed at [facility]    Report Status 03/19/2015 FINAL  Final     Labs: Basic Metabolic Panel:  Recent Labs Lab 03/17/15 1919 03/18/15 0425 03/19/15 0532  NA 141 140 139  K 3.6 3.2* 3.9  CL 101 104 107  CO2 25 27 24   GLUCOSE 108* 64* 111*  BUN 12 11 13   CREATININE 0.76 0.70 0.63  CALCIUM 9.0 9.0 9.0   Liver Function Tests:  Recent Labs Lab 03/17/15 1919 03/18/15 0425  AST 15 11*  ALT 11* 8*  ALKPHOS 73 63  BILITOT 1.0 0.9  PROT 6.1* 5.5*  ALBUMIN 3.3* 2.8*    Recent Labs Lab 03/20/15 1545  AMMONIA 23   CBC:  Recent Labs Lab 03/17/15 1919 03/18/15 0425 03/19/15 0532  WBC 9.1 8.1 9.5  NEUTROABS 7.1  --   --   HGB 13.8 12.9 13.0  HCT 41.6 39.4 40.1  MCV 90.0 91.0 92.0  PLT 212 193 191   CBG:  Recent Labs Lab 03/21/15 1649 03/21/15 2242 03/22/15 0755 03/22/15 1234 03/22/15 1716  GLUCAP 127* 138* 114* 141* 145*     IMAGING STUDIES X-ray Chest Pa And Lateral  03/18/2015   CLINICAL DATA:  Abdominal pain, history of aortic stenosis and CHF  EXAM: CHEST  2 VIEW  COMPARISON:  09/09/2014  FINDINGS: Aortic graft in place. Heart size mildly enlarged with aortic unfolding/ectasia. Stable prominence of the superior vascular pedicle. Diffusely prominent interstitial reticular opacities are noted. No new focal pulmonary opacity. Small left pleural effusion or thickening again noted. Remote right-sided rib deformities.  IMPRESSION: Small left pleural effusion, decreased from previously, possibly thickening/scarring.   Electronically Signed   By: 03/20/2015 M.D.   On: 03/18/2015 08:44   Ct Head Wo Contrast  03/17/2015   CLINICAL DATA:  Patient said she has frontal headaches that come and go  EXAM: CT HEAD WITHOUT CONTRAST  TECHNIQUE: Contiguous axial images were obtained from the base of the skull through the vertex without intravenous contrast.  COMPARISON:  11/17/2013  FINDINGS: Ventricles normal configuration. There is  ventricular and sulcal enlargement reflecting mild age related atrophy. There are no parenchymal masses or mass effect. There is no evidence of a recent cortical infarct. There are areas of white matter hypoattenuation consistent with mild to moderate chronic microvascular ischemic change. Several small more focal areas of white matter hypoattenuation are likely old lacune infarcts.  There are no extra-axial masses or abnormal fluid collections.  There is no intracranial hemorrhage.  Mild mucosal thickening in some dependent fluid lies in the left sphenoid sinus. Remaining visualized sinuses are clear. Clear mastoid air cells.  IMPRESSION: 1. No acute intracranial abnormalities. 2. Mild atrophy and mild to moderate chronic microvascular ischemic change.   Electronically Signed   By: 03/19/2015 M.D.   On: 03/17/2015 20:06   Mr Brain Wo Contrast  03/18/2015   CLINICAL DATA:  Mild cognitive impairment, congestive heart failure, aortic stenosis with history of falls, developed sudden onset of confusion. Intermittent frontal headaches.  EXAM: MRI HEAD WITHOUT CONTRAST  TECHNIQUE: Multiplanar, multiecho pulse sequences of the brain and surrounding structures were obtained without intravenous contrast.  COMPARISON:  CT head 03/17/2015.  FINDINGS: No evidence for acute infarction, hemorrhage, mass lesion, hydrocephalus, or extra-axial fluid. Mild cerebral and cerebellar atrophy. Moderately advanced  subcortical and periventricular T2 and FLAIR hyperintensities, likely chronic microvascular ischemic change.  Pituitary, pineal, and cerebellar tonsils unremarkable. No upper cervical lesions. Flow voids are maintained throughout the carotid, basilar, and vertebral arteries. There are no areas of chronic hemorrhage.  Mild chronic sinus disease LEFT sphenoid division. No sinus air-fluid levels. Negative orbits. Extracranial soft tissues otherwise unremarkable.  IMPRESSION: Age-related changes as described.  No acute  intracranial findings.  Mild chronic paranasal sinus disease without significant air-fluid level.  No acute abnormality is seen which might contribute to headaches or confusion.   Electronically Signed   By: Davonna Belling M.D.   On: 03/18/2015 08:34   Ct Abdomen Pelvis W Contrast  03/18/2015   CLINICAL DATA:  Right upper quadrant abdominal pain  EXAM: CT ABDOMEN AND PELVIS WITH CONTRAST  TECHNIQUE: Multidetector CT imaging of the abdomen and pelvis was performed using the standard protocol following bolus administration of intravenous contrast.  CONTRAST:  OMNIPAQUE IOHEXOL 300 MG/ML  SOLN  COMPARISON:  09/16/2011  FINDINGS: Lower chest: Evidence of aortic and mitral valvuloplasty. Heart size upper limits of normal. Small hiatal hernia. Dependent bibasilar atelectasis.  Hepatobiliary: 5 mm too small to characterize posterior segment right hepatic lobe hypodense lesion is stable, most likely a cyst or hamartoma. Cholecystectomy clips are noted. Stable dilatation of the common duct with tapering to the ampulla, maximal diameter 1.3 cm image 29.  Pancreas: Large to adrenal diverticulum. Pancreas partly fatty replaced with duct diameter at upper limits of normal measuring 3 mm at the level the pancreatic head. No peripancreatic fluid or mass identified.  Spleen: 4 mm subjectively fluid density splenic lesion could represent a cyst although lymphangioma or hemangioma could appear similar.  Adrenals/Urinary Tract: Bilateral renal cortical cysts are reidentified, subjectively slightly increased from previously, largest right mid kidney 0.7 cm image 31. No hydroureteronephrosis. No radiopaque renal or ureteral calculus.  Stomach/Bowel: Appendix not visualized, no secondary evidence for acute appendicitis. Colonic diverticuli noted without evidence for diverticulitis.  Vascular/Lymphatic: Moderate atheromatous aortic calcification without aneurysm. No lymphadenopathy.  Reproductive: Uterus and ovaries presumed  surgically absent, not visualized.  Other: No free air or fluid.  Musculoskeletal: Remote right inferior and superior pubic rami fractures and remote left inferior pubic ramus fracture. Streak artifact from left total hip arthroplasty. Lumbar spine disc degenerative change noted at multiple levels. Compression deformities at L2 and L4 are new since the prior exam but otherwise age indeterminate.  IMPRESSION: No acute intra-abdominal or pelvic pathology.  Age indeterminate L2 and L4 compression deformities.   Electronically Signed   By: Christiana Pellant M.D.   On: 03/18/2015 08:36    DISCHARGE EXAMINATION: Filed Vitals:   03/21/15 1346 03/21/15 2241 03/22/15 0555 03/22/15 1431  BP: 108/54 118/90 167/76 121/58  Pulse: 108 108 106 87  Temp: 98.2 F (36.8 C) 98.4 F (36.9 C) 98.3 F (36.8 C) 98.5 F (36.9 C)  TempSrc: Oral Oral Oral Oral  Resp: 18 17 16 16   Height:      Weight:   57.7 kg (127 lb 3.3 oz)   SpO2: 97% 94% 97% 95%   General appearance: alert, cooperative, appears stated age and no distress Resp: clear to auscultation bilaterally Cardio: regular rate and rhythm, S1, S2 normal, no murmur, click, rub or gallop GI: soft, non-tender; bowel sounds normal; no masses,  no organomegaly Neurologic: No focal deficits. Confused at times.  DISPOSITION: Home with home health  Discharge Instructions    Call MD for:  extreme fatigue  Complete by:  As directed      Call MD for:  persistant dizziness or light-headedness    Complete by:  As directed      Call MD for:  persistant nausea and vomiting    Complete by:  As directed      Call MD for:  severe uncontrolled pain    Complete by:  As directed      Call MD for:  temperature >100.4    Complete by:  As directed      Diet - low sodium heart healthy    Complete by:  As directed      Discharge instructions    Complete by:  As directed   Please follow up with your PCP next week.  You were cared for by a hospitalist during your  hospital stay. If you have any questions about your discharge medications or the care you received while you were in the hospital after you are discharged, you can call the unit and asked to speak with the hospitalist on call if the hospitalist that took care of you is not available. Once you are discharged, your primary care physician will handle any further medical issues. Please note that NO REFILLS for any discharge medications will be authorized once you are discharged, as it is imperative that you return to your primary care physician (or establish a relationship with a primary care physician if you do not have one) for your aftercare needs so that they can reassess your need for medications and monitor your lab values. If you do not have a primary care physician, you can call [phone] for a physician referral.     Increase activity slowly    Complete by:  As directed            ALLERGIES:  Allergies  Allergen Reactions  . Penicillins Hives     Current Discharge Medication List    START taking these medications   Details  vitamin B-12 (CYANOCOBALAMIN) 1000 MCG tablet Take 1 tablet (1,000 mcg total) by mouth daily. Qty: 30 tablet, Refills: 0      CONTINUE these medications which have CHANGED   Details  ALPRAZolam (XANAX) 0.5 MG tablet Take 2 tablets (1 mg total) by mouth 3 (three) times daily as needed for anxiety. Qty: 30 tablet, Refills: 0    buPROPion (WELLBUTRIN) 100 MG tablet Take 1 tablet (100 mg total) by mouth every morning. Qty: 30 tablet, Refills: 1      CONTINUE these medications which have NOT CHANGED   Details  bisoprolol (ZEBETA) 5 MG tablet Take 1 tablet (5 mg total) by mouth daily. Qty: 30 tablet, Refills: 6    Cholecalciferol (VITAMIN D3) 5000 UNITS CAPS Take 50,000 Units by mouth once a week. Wednesday    Menaquinone-7 (VITAMIN K2 PO) Take 1 tablet by mouth daily.    mirtazapine (REMERON) 30 MG tablet Take 30 mg by mouth at bedtime.     travoprost,  benzalkonium, (TRAVATAN) 0.004 % ophthalmic solution Place 1 drop into both eyes at bedtime.      STOP taking these medications     nortriptyline (PAMELOR) 75 MG capsule        Follow-up Information    Follow up with [name], MD. Schedule an appointment as soon as possible for a visit in 1 week.   Specialty:  Internal Medicine   Why:  post hospitalization follow up   Contact information:   [address] [place] [address] [phone]  Follow up with [facility].   Why:  Will call in next 24-48 hours to schedule first home visit   Contact information:   [address] [place] [address] [phone]       TOTAL DISCHARGE TIME: 35 mins  [facility]  [facility] Pager [phone]  03/22/2015, 6:15 PM

## 2015-03-22 NOTE — Progress Notes (Signed)
Utilization Review completed. Avaneesh Pepitone RN BSN CM 

## 2015-03-23 LAB — ANTI-PARIETAL ANTIBODY: Parietal Cell Antibody-IgG: 11 Units (ref 0.0–20.0)

## 2015-03-23 LAB — INTRINSIC FACTOR ANTIBODIES: Intrinsic Factor: 15.9 AU/mL — ABNORMAL HIGH (ref 0.0–1.1)

## 2015-05-27 ENCOUNTER — Encounter (HOSPITAL_COMMUNITY): Payer: Self-pay | Admitting: Emergency Medicine

## 2015-05-27 ENCOUNTER — Emergency Department (HOSPITAL_COMMUNITY): Payer: Medicare Other

## 2015-05-27 ENCOUNTER — Emergency Department (HOSPITAL_COMMUNITY)
Admission: EM | Admit: 2015-05-27 | Discharge: 2015-05-27 | Disposition: A | Discharge status: Home / Self Care | Payer: Medicare Other | Attending: Emergency Medicine | Admitting: Emergency Medicine

## 2015-05-27 DIAGNOSIS — Z7952 Long term (current) use of systemic steroids: Secondary | ICD-10-CM | POA: Insufficient documentation

## 2015-05-27 DIAGNOSIS — M069 Rheumatoid arthritis, unspecified: Secondary | ICD-10-CM | POA: Insufficient documentation

## 2015-05-27 DIAGNOSIS — M810 Age-related osteoporosis without current pathological fracture: Secondary | ICD-10-CM | POA: Insufficient documentation

## 2015-05-27 DIAGNOSIS — I509 Heart failure, unspecified: Secondary | ICD-10-CM | POA: Insufficient documentation

## 2015-05-27 DIAGNOSIS — F329 Major depressive disorder, single episode, unspecified: Secondary | ICD-10-CM | POA: Diagnosis not present

## 2015-05-27 DIAGNOSIS — R011 Cardiac murmur, unspecified: Secondary | ICD-10-CM | POA: Insufficient documentation

## 2015-05-27 DIAGNOSIS — Y998 Other external cause status: Secondary | ICD-10-CM | POA: Insufficient documentation

## 2015-05-27 DIAGNOSIS — F419 Anxiety disorder, unspecified: Secondary | ICD-10-CM | POA: Insufficient documentation

## 2015-05-27 DIAGNOSIS — Z87440 Personal history of urinary (tract) infections: Secondary | ICD-10-CM | POA: Insufficient documentation

## 2015-05-27 DIAGNOSIS — Y9289 Other specified places as the place of occurrence of the external cause: Secondary | ICD-10-CM | POA: Diagnosis not present

## 2015-05-27 DIAGNOSIS — T1490XA Injury, unspecified, initial encounter: Secondary | ICD-10-CM

## 2015-05-27 DIAGNOSIS — Z880 Allergy status to penicillin: Secondary | ICD-10-CM | POA: Diagnosis not present

## 2015-05-27 DIAGNOSIS — Z9889 Other specified postprocedural states: Secondary | ICD-10-CM | POA: Diagnosis not present

## 2015-05-27 DIAGNOSIS — Z8719 Personal history of other diseases of the digestive system: Secondary | ICD-10-CM | POA: Insufficient documentation

## 2015-05-27 DIAGNOSIS — I2510 Atherosclerotic heart disease of native coronary artery without angina pectoris: Secondary | ICD-10-CM | POA: Diagnosis not present

## 2015-05-27 DIAGNOSIS — Z8701 Personal history of pneumonia (recurrent): Secondary | ICD-10-CM | POA: Diagnosis not present

## 2015-05-27 DIAGNOSIS — Y9389 Activity, other specified: Secondary | ICD-10-CM | POA: Diagnosis not present

## 2015-05-27 DIAGNOSIS — Z79899 Other long term (current) drug therapy: Secondary | ICD-10-CM | POA: Diagnosis not present

## 2015-05-27 DIAGNOSIS — S20212A Contusion of left front wall of thorax, initial encounter: Secondary | ICD-10-CM | POA: Diagnosis not present

## 2015-05-27 DIAGNOSIS — S3991XA Unspecified injury of abdomen, initial encounter: Secondary | ICD-10-CM | POA: Diagnosis not present

## 2015-05-27 DIAGNOSIS — E119 Type 2 diabetes mellitus without complications: Secondary | ICD-10-CM | POA: Insufficient documentation

## 2015-05-27 DIAGNOSIS — S299XXA Unspecified injury of thorax, initial encounter: Secondary | ICD-10-CM | POA: Diagnosis present

## 2015-05-27 DIAGNOSIS — W01198A Fall on same level from slipping, tripping and stumbling with subsequent striking against other object, initial encounter: Secondary | ICD-10-CM | POA: Diagnosis not present

## 2015-05-27 LAB — CBC WITH DIFFERENTIAL/PLATELET
Basophils Absolute: 0 10*3/uL (ref 0.0–0.1)
Basophils Relative: 0 % (ref 0–1)
EOS ABS: 0.1 10*3/uL (ref 0.0–0.7)
Eosinophils Relative: 1 % (ref 0–5)
HCT: 43.5 % (ref 36.0–46.0)
Hemoglobin: 14 g/dL (ref 12.0–15.0)
LYMPHS ABS: 2.3 10*3/uL (ref 0.7–4.0)
Lymphocytes Relative: 18 % (ref 12–46)
MCH: 30 pg (ref 26.0–34.0)
MCHC: 32.2 g/dL (ref 30.0–36.0)
MCV: 93.1 fL (ref 78.0–100.0)
MONOS PCT: 12 % (ref 3–12)
Monocytes Absolute: 1.6 10*3/uL — ABNORMAL HIGH (ref 0.1–1.0)
Neutro Abs: 8.7 10*3/uL — ABNORMAL HIGH (ref 1.7–7.7)
Neutrophils Relative %: 69 % (ref 43–77)
PLATELETS: 246 10*3/uL (ref 150–400)
RBC: 4.67 MIL/uL (ref 3.87–5.11)
RDW: 13.9 % (ref 11.5–15.5)
WBC: 12.7 10*3/uL — ABNORMAL HIGH (ref 4.0–10.5)

## 2015-05-27 LAB — COMPREHENSIVE METABOLIC PANEL
ALBUMIN: 4.1 g/dL (ref 3.5–5.0)
ALK PHOS: 58 U/L (ref 38–126)
ALT: 10 U/L — AB (ref 14–54)
AST: 19 U/L (ref 15–41)
Anion gap: 12 (ref 5–15)
BUN: 27 mg/dL — ABNORMAL HIGH (ref 6–20)
CO2: 30 mmol/L (ref 22–32)
Calcium: 10.2 mg/dL (ref 8.9–10.3)
Chloride: 98 mmol/L — ABNORMAL LOW (ref 101–111)
Creatinine, Ser: 1.06 mg/dL — ABNORMAL HIGH (ref 0.44–1.00)
GFR calc Af Amer: 57 mL/min — ABNORMAL LOW (ref >60)
GFR, EST NON AFRICAN AMERICAN: 49 mL/min — AB (ref >60)
Glucose, Bld: 94 mg/dL (ref 65–99)
POTASSIUM: 3.8 mmol/L (ref 3.5–5.1)
Sodium: 140 mmol/L (ref 135–145)
TOTAL PROTEIN: 7.4 g/dL (ref 6.5–8.1)
Total Bilirubin: 0.6 mg/dL (ref 0.3–1.2)

## 2015-05-27 MED ORDER — IOHEXOL 300 MG/ML  SOLN
100.0000 mL | Freq: Once | INTRAMUSCULAR | Status: AC | PRN
  Administered 2015-05-27: 80 mL via INTRAVENOUS

## 2015-05-27 MED ORDER — MORPHINE SULFATE 4 MG/ML IJ SOLN
4.0000 mg | Freq: Once | INTRAMUSCULAR | Status: AC
  Administered 2015-05-27: 4 mg via INTRAVENOUS
  Filled 2015-05-27: qty 1

## 2015-05-27 MED ORDER — OXYCODONE-ACETAMINOPHEN 5-325 MG PO TABS: 1.0000 | ORAL_TABLET | Freq: Four times a day (QID) | ORAL | Status: DC | PRN

## 2015-05-27 NOTE — ED Notes (Addendum)
Pt c/o lt lower rib/flank area pain s/p to tripping and falling and hitting back on nightstand. Denies hitting head, LOC, dizziness/lightheadedness, nausea or visual disturbances. Pt denies chest pain. Pt ambulated to triage with rolator and steady gait.

## 2015-05-27 NOTE — ED Provider Notes (Signed)
CSN: [id number]     Arrival date & time 05/27/15  1725 History   First MD Initiated Contact with Patient 05/27/15 1931     Chief Complaint  Patient presents with  . Fall     (Consider location/radiation/quality/duration/timing/severity/associated sxs/prior Treatment) The history is provided by the patient.  [name] is a 80 y.o. female hx of rheumatoid arthritis, aortic stenosis, UTI here with fall with flank pain. Patient fell 3 days ago and hit the left side of her flank on night stand. Denies any syncope or loss of consciousness or head injury. Has progressive pain and bruising on that area. Denies any vomiting. Went to urgent care was sent here for CT abdomen pelvis.    Past Medical History  Diagnosis Date  . Osteoporosis   . Rheumatoid arthritis(714.0)   . Murmur, cardiac   . Pneumonia   . Anxiety   . Adrenal insufficiency, primary, iatrogenic   . Gait instability   . Physical deconditioning   . SOBOE (shortness of breath on exertion)     echo 02/23/13-EF 55-60%, possible bicuspid aortic valve with mod to severed stenosis   . Palpitations   . Chest pain     myoview 04/13/04-inferoapical thinning with a suggestion of mild apical ischemia, EF 66%  . Syncope   . Aortic stenosis, severe 02/26/2013  . Pancreatic insufficiency 11/09/2012  . UTI (lower urinary tract infection) 10/01/2011  . Hearing loss 03/25/2013  . Memory deficit 03/25/2013  . Depression 03/25/2013  . Pulmonary fibrosis 03/25/2013  . Coronary artery disease   . CHF (congestive heart failure)   . Diabetes mellitus without complication    Past Surgical History  Procedure Laterality Date  . Knee arthroscopy      2 on left  . Cholecystectomy    . Thyroidectomy    . Abdominal hysterectomy    . Cardiac catheterization  11/08/96    no evidence of CAD, nl LV function  . Shoulder surgery Left   . Right heart catheterization  03/22/2013    Procedure: RIGHT HEART CATH;  Surgeon: [name], MD;  Location: [facility] CATH  LAB;  Service: Cardiovascular;;  . Coronary angiogram  03/22/2013    Procedure: CORONARY ANGIOGRAM;  Surgeon: [name], MD;  Location: [facility] CATH LAB;  Service: Cardiovascular;;   Family History  Problem Relation Age of Onset  . Stroke Brother   . Cancer - Colon Father   . Cancer Sister     spine?   History  Substance Use Topics  . Smoking status: Never Smoker   . Smokeless tobacco: Never Used  . Alcohol Use: No   OB History    No data available     Review of Systems  Genitourinary: Positive for flank pain.  Musculoskeletal:       Rib pain   All other systems reviewed and are negative.     Allergies  Penicillins  Home Medications   Prior to Admission medications   Medication Sig Start Date End Date Taking? Authorizing Provider  ALPRAZolam Prudy Feeler) 0.5 MG tablet Take 2 tablets (1 mg total) by mouth 3 (three) times daily as needed for anxiety. 03/22/15  Yes Osvaldo Shipper, MD  bisoprolol (ZEBETA) 5 MG tablet Take 1 tablet (5 mg total) by mouth daily. 11/08/14  Yes [name], MD  buPROPion (WELLBUTRIN) 75 MG tablet Take 75 mg by mouth daily. 04/24/15  Yes Historical Provider, MD  Cholecalciferol (VITAMIN D3) 5000 UNITS CAPS Take 50,000 Units by mouth  once a week. Wednesday   Yes Historical Provider, MD  mirtazapine (REMERON) 30 MG tablet Take 30 mg by mouth at bedtime.    Yes Historical Provider, MD  nortriptyline (PAMELOR) 75 MG capsule Take 75 mg by mouth at bedtime.   Yes Historical Provider, MD  predniSONE (DELTASONE) 5 MG tablet Take 7.5 mg by mouth daily with breakfast.   Yes Historical Provider, MD  travoprost, benzalkonium, (TRAVATAN) 0.004 % ophthalmic solution Place 1 drop into both eyes at bedtime.   Yes Historical Provider, MD  vitamin B-12 (CYANOCOBALAMIN) 1000 MCG tablet Take 1 tablet (1,000 mcg total) by mouth daily. 03/22/15  Yes Osvaldo Shipper, MD  buPROPion (WELLBUTRIN) 100 MG tablet Take 1 tablet (100 mg total) by mouth every morning. Patient not  taking: Reported on 05/27/2015 03/22/15   Osvaldo Shipper, MD   BP 118/47 mmHg  Pulse 85  Temp(Src) 98.3 F (36.8 C) (Oral)  Resp 18  Ht  (1.6 m)  Wt 120 lb (54.432 kg)  BMI 21.26 kg/m2  SpO2 92% Physical Exam  Constitutional: She is oriented to person, place, and time.  Uncomfortable   HENT:  Head: Normocephalic and atraumatic.  Mouth/Throat: Oropharynx is clear and moist.  Eyes: Conjunctivae are normal. Pupils are equal, round, and reactive to light.  Neck: Normal range of motion.  Cardiovascular: Normal rate, regular rhythm and normal heart sounds.   Pulmonary/Chest: Effort normal and breath sounds normal.  + bruising L lower ribs. + tenderness,   Abdominal: Soft. Bowel sounds are normal.  Mild L CVAT, bruising L flank   Musculoskeletal: Normal range of motion. She exhibits no edema or tenderness.  Neurological: She is alert and oriented to person, place, and time. No cranial nerve deficit. Coordination normal.  Skin: Skin is warm and dry.  Psychiatric: She has a normal mood and affect. Her behavior is normal. Judgment and thought content normal.  Nursing note and vitals reviewed.   ED Course  Procedures (including critical care time) Labs Review Labs Reviewed  CBC WITH DIFFERENTIAL/PLATELET - Abnormal; Notable for the following:    WBC 12.7 (*)    Neutro Abs 8.7 (*)    Monocytes Absolute 1.6 (*)    All other components within normal limits  COMPREHENSIVE METABOLIC PANEL - Abnormal; Notable for the following:    Chloride 98 (*)    BUN 27 (*)    Creatinine, Ser 1.06 (*)    ALT 10 (*)    GFR calc non Af Amer 49 (*)    GFR calc Af Amer 57 (*)    All other components within normal limits  URINALYSIS, ROUTINE W REFLEX MICROSCOPIC (NOT AT [facility])    Imaging Review Dg Ribs Unilateral W/chest Left  05/27/2015   CLINICAL DATA:  Larey Seat Thursday when she stood up from bed, fell onto night stand on LEFT side, posterior LEFT rib pain  EXAM: LEFT RIBS AND CHEST - 3+ VIEW   COMPARISON:  Chest radiograph 03/18/2015  FINDINGS: Mild enlargement of cardiac silhouette.  LEFT ventricular outflow/aortic stent graft again identified.  Atherosclerotic calcification aorta.  Minimal LEFT basilar atelectasis.  Lungs appear emphysematous but otherwise clear.  No pleural effusion or pneumothorax.  Bones diffusely demineralized.  BB placed at site of symptoms lower LEFT chest.  No definite rib fracture or bone destruction identified, though the degree of osseous demineralization limits visualization and assessment of the posterior lower ribs.  IMPRESSION: Enlargement of cardiac silhouette.  Emphysematous changes with minimal LEFT basilar atelectasis.  No definite  acute osseous abnormalities identified as above.   Electronically Signed   By: Ulyses Southward M.D.   On: 05/27/2015 20:30   Ct Chest W Contrast  05/27/2015   CLINICAL DATA:  Patient status post fall with lower rib and flank pain. No loss of consciousness.  EXAM: CT CHEST, ABDOMEN, AND PELVIS WITH CONTRAST  TECHNIQUE: Multidetector CT imaging of the chest, abdomen and pelvis was performed following the standard protocol during bolus administration of intravenous contrast.  CONTRAST:  74mL OMNIPAQUE IOHEXOL 300 MG/ML  SOLN  COMPARISON:  CT abdomen pelvis 03/18/2015  FINDINGS: CT CHEST FINDINGS  Mediastinum/Nodes: 1.5 cm nodule within the right lobe of the thyroid. Stent graft material within the ascending thoracic aorta. Main pulmonary artery is enlarged. Normal heart size. Mitral annular calcifications.  Lungs/Pleura: Central airways are patent. Bandlike consolidative opacities within the left lower lobe. No pleural effusion or pneumothorax.  CT ABDOMEN AND PELVIS FINDINGS  Hepatobiliary: Liver is normal in size and contour. Patient status post cholecystectomy. Stable too small to characterize low-attenuation lesion posterior right hepatic lobe. Unchanged mild central intrahepatic and extrahepatic biliary ductal dilatation with the common bile  duct measuring up to 14 mm, unchanged from prior.  Pancreas: Pancreatic parenchymal atrophy.  Spleen: Grossly stable low-attenuation splenic lesions.  Adrenals/Urinary Tract: Normal adrenal glands. Kidneys enhance symmetrically with contrast. Unchanged 6 mm stone interpolar region left kidney. Stable bilateral renal hypodensities. Urinary bladder is poorly evaluated due to streak artifact.  Stomach/Bowel: No abnormal bowel wall thickening or evidence for bowel obstruction. No free fluid or free intraperitoneal air.  Vascular/Lymphatic: Retro aortic left renal vein. Normal caliber abdominal aorta. No retroperitoneal adenopathy.  Other: None  Musculoskeletal: Left hip arthroplasty. Lumbar spine degenerative changes. Age-indeterminate left fourth rib fracture (image 11; series 2). Re- demonstrated L4 compression deformity and degenerative changes L2-L3. Re- demonstrated T6 compression deformity. Chronic appearing right pubic ramus fracture.  IMPRESSION: No acute process within the chest, abdomen or pelvis.  Age-indeterminate but likely chronic left fourth rib fracture. Recommend correlation for point tenderness.  Stable common bile duct dilatation status post cholecystectomy.  Probable atelectasis left lung base.  Right thyroid lobe nodule. Consider further evaluation with thyroid ultrasound.   Electronically Signed   By: Annia Belt M.D.   On: 05/27/2015 22:25   Ct Abdomen Pelvis W Contrast  05/27/2015   CLINICAL DATA:  Patient status post fall with lower rib and flank pain. No loss of consciousness.  EXAM: CT CHEST, ABDOMEN, AND PELVIS WITH CONTRAST  TECHNIQUE: Multidetector CT imaging of the chest, abdomen and pelvis was performed following the standard protocol during bolus administration of intravenous contrast.  CONTRAST:  79mL OMNIPAQUE IOHEXOL 300 MG/ML  SOLN  COMPARISON:  CT abdomen pelvis 03/18/2015  FINDINGS: CT CHEST FINDINGS  Mediastinum/Nodes: 1.5 cm nodule within the right lobe of the thyroid. Stent  graft material within the ascending thoracic aorta. Main pulmonary artery is enlarged. Normal heart size. Mitral annular calcifications.  Lungs/Pleura: Central airways are patent. Bandlike consolidative opacities within the left lower lobe. No pleural effusion or pneumothorax.  CT ABDOMEN AND PELVIS FINDINGS  Hepatobiliary: Liver is normal in size and contour. Patient status post cholecystectomy. Stable too small to characterize low-attenuation lesion posterior right hepatic lobe. Unchanged mild central intrahepatic and extrahepatic biliary ductal dilatation with the common bile duct measuring up to 14 mm, unchanged from prior.  Pancreas: Pancreatic parenchymal atrophy.  Spleen: Grossly stable low-attenuation splenic lesions.  Adrenals/Urinary Tract: Normal adrenal glands. Kidneys enhance symmetrically with contrast. Unchanged  6 mm stone interpolar region left kidney. Stable bilateral renal hypodensities. Urinary bladder is poorly evaluated due to streak artifact.  Stomach/Bowel: No abnormal bowel wall thickening or evidence for bowel obstruction. No free fluid or free intraperitoneal air.  Vascular/Lymphatic: Retro aortic left renal vein. Normal caliber abdominal aorta. No retroperitoneal adenopathy.  Other: None  Musculoskeletal: Left hip arthroplasty. Lumbar spine degenerative changes. Age-indeterminate left fourth rib fracture (image 11; series 2). Re- demonstrated L4 compression deformity and degenerative changes L2-L3. Re- demonstrated T6 compression deformity. Chronic appearing right pubic ramus fracture.  IMPRESSION: No acute process within the chest, abdomen or pelvis.  Age-indeterminate but likely chronic left fourth rib fracture. Recommend correlation for point tenderness.  Stable common bile duct dilatation status post cholecystectomy.  Probable atelectasis left lung base.  Right thyroid lobe nodule. Consider further evaluation with thyroid ultrasound.   Electronically Signed   By: Annia Belt M.D.   On:  05/27/2015 22:25     EKG Interpretation None      MDM   Final diagnoses:  Trauma    [name] is a 80 y.o. female here with fall. Has L flank ecchymosis. Will get rib xrays and CT ab/pel to r/o splenic injury or renal injury.   10:30 PM Xray unremarkable. CT showed no acute fracture. Has old fractures. Likely bruise. Will dc home with percocet prn.     Richardean Canal, MD 05/27/15 2231

## 2015-05-27 NOTE — ED Notes (Signed)
Pt ambulated with walker and steady gait to and from restroom with no difficulties to give urine sample.

## 2015-05-27 NOTE — Discharge Instructions (Signed)
Take motrin for pain.   Take percocet for severe pain.  Follow up with your doctor.   Return to ER if you have severe pain, vomiting, unable to walk.

## 2015-05-27 NOTE — ED Notes (Signed)
Patient transported to X-ray 

## 2015-06-01 ENCOUNTER — Other Ambulatory Visit: Payer: Self-pay | Admitting: Dermatology

## 2015-06-14 ENCOUNTER — Other Ambulatory Visit: Payer: Self-pay | Admitting: Internal Medicine

## 2015-06-14 NOTE — Telephone Encounter (Signed)
Rx request sent to pharmacy.  

## 2015-06-18 ENCOUNTER — Other Ambulatory Visit: Payer: Self-pay | Admitting: Internal Medicine

## 2015-06-19 NOTE — Telephone Encounter (Signed)
REFILL 

## 2015-07-10 ENCOUNTER — Other Ambulatory Visit: Payer: Self-pay | Admitting: Dermatology

## 2015-08-01 ENCOUNTER — Emergency Department (HOSPITAL_COMMUNITY): Payer: Medicare Other

## 2015-08-01 ENCOUNTER — Emergency Department (HOSPITAL_COMMUNITY)
Admission: EM | Admit: 2015-08-01 | Discharge: 2015-08-01 | Disposition: A | Discharge status: Home / Self Care | Payer: Medicare Other | Attending: Emergency Medicine | Admitting: Emergency Medicine

## 2015-08-01 ENCOUNTER — Encounter (HOSPITAL_COMMUNITY): Payer: Self-pay | Admitting: Emergency Medicine

## 2015-08-01 DIAGNOSIS — Z87440 Personal history of urinary (tract) infections: Secondary | ICD-10-CM | POA: Diagnosis not present

## 2015-08-01 DIAGNOSIS — Z880 Allergy status to penicillin: Secondary | ICD-10-CM | POA: Diagnosis not present

## 2015-08-01 DIAGNOSIS — Y9301 Activity, walking, marching and hiking: Secondary | ICD-10-CM | POA: Diagnosis not present

## 2015-08-01 DIAGNOSIS — Z8679 Personal history of other diseases of the circulatory system: Secondary | ICD-10-CM | POA: Insufficient documentation

## 2015-08-01 DIAGNOSIS — Y92480 Sidewalk as the place of occurrence of the external cause: Secondary | ICD-10-CM | POA: Diagnosis not present

## 2015-08-01 DIAGNOSIS — W19XXXA Unspecified fall, initial encounter: Secondary | ICD-10-CM

## 2015-08-01 DIAGNOSIS — H9109 Ototoxic hearing loss, unspecified ear: Secondary | ICD-10-CM | POA: Diagnosis not present

## 2015-08-01 DIAGNOSIS — S3992XA Unspecified injury of lower back, initial encounter: Secondary | ICD-10-CM | POA: Insufficient documentation

## 2015-08-01 DIAGNOSIS — Z79899 Other long term (current) drug therapy: Secondary | ICD-10-CM | POA: Diagnosis not present

## 2015-08-01 DIAGNOSIS — Z8701 Personal history of pneumonia (recurrent): Secondary | ICD-10-CM | POA: Insufficient documentation

## 2015-08-01 DIAGNOSIS — S51802A Unspecified open wound of left forearm, initial encounter: Secondary | ICD-10-CM | POA: Insufficient documentation

## 2015-08-01 DIAGNOSIS — Z8639 Personal history of other endocrine, nutritional and metabolic disease: Secondary | ICD-10-CM | POA: Insufficient documentation

## 2015-08-01 DIAGNOSIS — W010XXA Fall on same level from slipping, tripping and stumbling without subsequent striking against object, initial encounter: Secondary | ICD-10-CM | POA: Diagnosis not present

## 2015-08-01 DIAGNOSIS — R011 Cardiac murmur, unspecified: Secondary | ICD-10-CM | POA: Insufficient documentation

## 2015-08-01 DIAGNOSIS — Z9889 Other specified postprocedural states: Secondary | ICD-10-CM | POA: Diagnosis not present

## 2015-08-01 DIAGNOSIS — Y998 Other external cause status: Secondary | ICD-10-CM | POA: Insufficient documentation

## 2015-08-01 DIAGNOSIS — Z8719 Personal history of other diseases of the digestive system: Secondary | ICD-10-CM | POA: Diagnosis not present

## 2015-08-01 DIAGNOSIS — M545 Low back pain: Secondary | ICD-10-CM

## 2015-08-01 DIAGNOSIS — S51812A Laceration without foreign body of left forearm, initial encounter: Secondary | ICD-10-CM

## 2015-08-01 DIAGNOSIS — Z8739 Personal history of other diseases of the musculoskeletal system and connective tissue: Secondary | ICD-10-CM | POA: Insufficient documentation

## 2015-08-01 DIAGNOSIS — F329 Major depressive disorder, single episode, unspecified: Secondary | ICD-10-CM | POA: Insufficient documentation

## 2015-08-01 DIAGNOSIS — Z7952 Long term (current) use of systemic steroids: Secondary | ICD-10-CM | POA: Diagnosis not present

## 2015-08-01 NOTE — ED Provider Notes (Signed)
CSN: [id number]     Arrival date & time 08/01/15  1736 History   First MD Initiated Contact with Patient 08/01/15 1813     Chief Complaint  Patient presents with  . Fall    left arm skin tear   HPI  Ms. Rochel is a 80 year old female presenting after a fall. Pt states she was walking on the sidewalk when she attempted to step off the curb and slipped. She fell back onto her buttocks. She denies hitting her head. Her daughter is present in the room and describes the fall as "falling into a sitting position like on a chair" and confirms that Ms. Farrelly did not hit her head. She was ambulatory after the fall with a normal gait. Pt's daughter attempted to grab her by the left arm to prevent the fall which resulted in a large skin tear of the forearm. Ms. Virella reports that she has tailbone pain currently. She states that it is mild but sharp when she moves. She denies pain anywhere else. She denies use of blood thinners. She states that she was not experiencing chest pain, SOB or dizziness before her fall.   Past Medical History  Diagnosis Date  . Osteoporosis   . Rheumatoid arthritis(714.0)   . Murmur, cardiac   . Pneumonia   . Anxiety   . Adrenal insufficiency, primary, iatrogenic (HCC)   . Gait instability   . Physical deconditioning   . SOBOE (shortness of breath on exertion)     echo 02/23/13-EF 55-60%, possible bicuspid aortic valve with mod to severed stenosis   . Palpitations   . Chest pain     myoview 04/13/04-inferoapical thinning with a suggestion of mild apical ischemia, EF 66%  . Syncope   . Aortic stenosis, severe 02/26/2013  . Pancreatic insufficiency (HCC) 11/09/2012  . UTI (lower urinary tract infection) 10/01/2011  . Hearing loss 03/25/2013  . Memory deficit 03/25/2013  . Depression 03/25/2013  . Pulmonary fibrosis (HCC) 03/25/2013   Past Surgical History  Procedure Laterality Date  . Knee arthroscopy      2 on left  . Cholecystectomy    . Thyroidectomy    . Abdominal  hysterectomy    . Cardiac catheterization  11/08/96    no evidence of CAD, nl LV function  . Shoulder surgery Left   . Right heart catheterization  03/22/2013    Procedure: RIGHT HEART CATH;  Surgeon: [name], MD;  Location: [facility] CATH LAB;  Service: Cardiovascular;;  . Coronary angiogram  03/22/2013    Procedure: CORONARY ANGIOGRAM;  Surgeon: [name], MD;  Location: [facility] CATH LAB;  Service: Cardiovascular;;   Family History  Problem Relation Age of Onset  . Stroke Brother   . Cancer - Colon Father   . Cancer Sister     spine?   Social History  Substance Use Topics  . Smoking status: Never Smoker   . Smokeless tobacco: Never Used  . Alcohol Use: No   OB History    No data available     Review of Systems  Respiratory: Negative for shortness of breath.   Cardiovascular: Negative for chest pain.  Musculoskeletal: Positive for back pain. Negative for joint swelling, arthralgias, gait problem and neck pain.  Skin: Positive for wound.  Neurological: Negative for dizziness, syncope and headaches.  All other systems reviewed and are negative.     Allergies  Adhesive and Penicillins  Home Medications   Prior to Admission medications  Medication Sig Start Date End Date Taking? Authorizing Provider  ALPRAZolam Prudy Feeler) 0.5 MG tablet Take 2 tablets (1 mg total) by mouth 3 (three) times daily as needed for anxiety. Patient taking differently: Take 0.25 mg by mouth at bedtime as needed for anxiety or sleep.  03/22/15  Yes Osvaldo Shipper, MD  bisoprolol (ZEBETA) 5 MG tablet Take 1 tablet (5 mg total) by mouth daily. NEED OV. 06/19/15  Yes [name], MD  buPROPion (WELLBUTRIN) 75 MG tablet Take 75 mg by mouth daily. 04/24/15  Yes Historical Provider, MD  clindamycin (CLEOCIN) 150 MG capsule Take 600 mg by mouth as directed. 06/29/15  Yes Historical Provider, MD  D3-50 50000 UNITS capsule Take 50,000 Units by mouth once a week. 06/18/15  Yes Historical Provider, MD   leflunomide (ARAVA) 10 MG tablet Take 10 mg by mouth daily. 07/20/15  Yes Historical Provider, MD  mirtazapine (REMERON) 30 MG tablet Take 30 mg by mouth at bedtime.    Yes Historical Provider, MD  nortriptyline (PAMELOR) 75 MG capsule Take 75 mg by mouth at bedtime.   Yes Historical Provider, MD  oxyCODONE-acetaminophen (PERCOCET) 5-325 MG per tablet Take 1 tablet by mouth every 6 (six) hours as needed. Patient taking differently: Take 1 tablet by mouth 2 (two) times daily as needed for moderate pain.  05/27/15  Yes Richardean Canal, MD  predniSONE (DELTASONE) 5 MG tablet Take 7.5 mg by mouth daily with breakfast.   Yes Historical Provider, MD  tetrahydrozoline (VISINE) 0.05 % ophthalmic solution Place 1 drop into both eyes 2 (two) times daily as needed (dry eyes).   Yes Historical Provider, MD  travoprost, benzalkonium, (TRAVATAN) 0.004 % ophthalmic solution Place 1 drop into both eyes at bedtime.   Yes Historical Provider, MD  vitamin B-12 (CYANOCOBALAMIN) 1000 MCG tablet Take 1 tablet (1,000 mcg total) by mouth daily. 03/22/15  Yes Osvaldo Shipper, MD   BP 142/54 mmHg  Pulse 72  Temp(Src) 97.7 F (36.5 C) (Oral)  Resp 16  SpO2 95% Physical Exam  Constitutional: She is oriented to person, place, and time. She appears well-developed and well-nourished. No distress.  HENT:  Head: Normocephalic and atraumatic.  Mouth/Throat: Oropharynx is clear and moist. No oropharyngeal exudate.  Eyes: Conjunctivae and EOM are normal. Pupils are equal, round, and reactive to light. Right eye exhibits no discharge. Left eye exhibits no discharge. No scleral icterus.  Neck: Normal range of motion.  Cardiovascular: Normal rate, regular rhythm and normal heart sounds.   Radial pulses palpable.   Pulmonary/Chest: Effort normal. No respiratory distress. She has no wheezes. She has no rales.  Abdominal: Soft. There is no tenderness. There is no rebound and no guarding.  Musculoskeletal: Normal range of motion.  Slight  TTP over midline lumbar spine. FROM of lumbar spine. No paraspinous muscle spasm present. Pt moves extremities spontaneously and without pain. No obvious deformities.   Neurological: She is alert and oriented to person, place, and time. No cranial nerve deficit. Coordination normal.  Skin: Skin is warm and dry.  Large 5 cm skin tear over the left posterior forearm with slight oozing of blood. Small 1 cm skin tear noted over right olecranon process with bleeding under control.   Psychiatric: She has a normal mood and affect. Her behavior is normal.  Nursing note and vitals reviewed.   ED Course  Procedures (including critical care time) Labs Review Labs Reviewed - No data to display  Imaging Review Dg Lumbar Spine Complete  08/01/2015  CLINICAL DATA:  80 year old female status post fall stepping off curb today. Lumbar back pain. Initial encounter. EXAM: LUMBAR SPINE - COMPLETE 4+ VIEW COMPARISON:  CT Abdomen and Pelvis 05/27/2015. FINDINGS: Dextro convex lumbar scoliosis appears mildly progressed since 2015. Normal lumbar segmentation. Osteopenia. L2 and L4 compression fractures appear stable since August. The L5 and L3 levels appear to remain intact. The L1 level and visualized lower thoracic levels appear to remain intact. Grossly intact sacrum. Partially visible left hip arthroplasty. Stable cholecystectomy clips. Partially visible aortic valve device. IMPRESSION: L2 and L4 compression fractures appear stable since August. No acute fracture or listhesis identified in the lumbar spine. Electronically Signed   By: Odessa Fleming M.D.   On: 08/01/2015 20:07   I have personally reviewed and evaluated these images and lab results as part of my medical decision-making.   EKG Interpretation None      MDM   Final diagnoses:  Fall, initial encounter  Low back pain without sciatica, unspecified back pain laterality  Skin tear of forearm without complication, left, initial encounter   Pt presenting  after a mechanical fall. Complaining of mild lower back/tailbone pain that is increased with movement. Skin tear over left forearm and right elbow. Pt denies hitting head. VSS. Lumbar spine xray shows stable compression fractures with no new abnormalities. Left forearm skin tear repaired with bacitracin, steri strips and tegaderm. Extra supplies given to daughter for bandage changes at home. Pt will follow up with PCP as needed. Return precautions given in discharge paperwork and discussed with pt at bedside. Pt stable for discharge    Alveta Heimlich, PA-C 08/02/15 1146  Azalia Bilis, MD 08/04/15 1455

## 2015-08-01 NOTE — Discharge Instructions (Signed)
Back Pain, Adult Back pain is very common. The pain often gets better over time. The cause of back pain is usually not dangerous. Most people can learn to manage their back pain on their own.  HOME CARE  Watch your back pain for any changes. The following actions may help to lessen any pain you are feeling:  Stay active. Start with short walks on flat ground if you can. Try to walk farther each day.  Exercise regularly as told by your doctor. Exercise helps your back heal faster. It also helps avoid future injury by keeping your muscles strong and flexible.  Do not sit, drive, or stand in one place for more than 30 minutes.  Do not stay in bed. Resting more than 1-2 days can slow down your recovery.  Be careful when you bend or lift an object. Use good form when lifting:  Bend at your knees.  Keep the object close to your body.  Do not twist.  Sleep on a firm mattress. Lie on your side, and bend your knees. If you lie on your back, put a pillow under your knees.  Take medicines only as told by your doctor.  Put ice on the injured area.  Put ice in a plastic bag.  Place a towel between your skin and the bag.  Leave the ice on for 20 minutes, 2-3 times a day for the first 2-3 days. After that, you can switch between ice and heat packs.  Avoid feeling anxious or stressed. Find good ways to deal with stress, such as exercise.  Maintain a healthy weight. Extra weight puts stress on your back. GET HELP IF:   You have pain that does not go away with rest or medicine.  You have worsening pain that goes down into your legs or buttocks.  You have pain that does not get better in one week.  You have pain at night.  You lose weight.  You have a fever or chills. GET HELP RIGHT AWAY IF:   You cannot control when you poop (bowel movement) or pee (urinate).  Your arms or legs feel weak.  Your arms or legs lose feeling (numbness).  You feel sick to your stomach (nauseous) or  throw up (vomit).  You have belly (abdominal) pain.  You feel like you may pass out (faint).   This information is not intended to replace advice given to you by your health care provider. Make sure you discuss any questions you have with your health care provider.   Document Released: 03/25/2008 Document Revised: 10/28/2014 Document Reviewed: 02/08/2014 Elsevier Interactive Patient Education 2016 [employer]   Skin Tear Care A skin tear is a wound in which the top layer of skin has peeled off. This is a common problem with aging because the skin becomes thinner and more fragile as a person gets older. In addition, some medicines, such as oral corticosteroids, can lead to skin thinning if taken for long periods of time.  A skin tear is often repaired with tape or skin adhesive strips. This keeps the skin that has been peeled off in contact with the healthier skin beneath. Depending on the location of the wound, a bandage (dressing) may be applied over the tape or skin adhesive strips. Sometimes, during the healing process, the skin turns black and dies. Even when this happens, the torn skin acts as a good dressing until the skin underneath gets healthier and repairs itself. HOME CARE INSTRUCTIONS   Change dressings  once per day or as directed by your caregiver.  Gently clean the skin tear and the area around the tear using saline solution or mild soap and water.  Do not rub the injured skin dry. Let the area air dry.  Apply petroleum jelly or an antibiotic cream or ointment to keep the tear moist. This will help the wound heal. Do not allow a scab to form.  If the dressing sticks before the next dressing change, moisten it with warm soapy water and gently remove it.  Protect the injured skin until it has healed.  Only take over-the-counter or prescription medicines as directed by your caregiver.  Take showers or baths using warm soapy water. Apply a new dressing after the shower or  bath.  Keep all follow-up appointments as directed by your caregiver.  SEEK IMMEDIATE MEDICAL CARE IF:   You have redness, swelling, or increasing pain in the skin tear.  You havepus coming from the skin tear.  You have chills.  You have a red streak that goes away from the skin tear.  You have a bad smell coming from the tear or dressing.  You have a fever or persistent symptoms for more than 2-3 days.  You have a fever and your symptoms suddenly get worse. MAKE SURE YOU:  Understand these instructions.  Will watch this condition.  Will get help right away if your child is not doing well or gets worse.   This information is not intended to replace advice given to you by your health care provider. Make sure you discuss any questions you have with your health care provider.   Document Released: 07/02/2001 Document Revised: 07/01/2012 Document Reviewed: 04/20/2012 Elsevier Interactive Patient Education [employer]

## 2015-08-01 NOTE — ED Notes (Signed)
Pt reports fall outside her house. sts missed step and fell backward laying on buttocks. Pt denies head injury nor loc.pt presents with buttocks pain and left forearm skin tear.  Pt denies anticoagulants meds. Pt is alert and oriented x 4.

## 2015-08-15 ENCOUNTER — Other Ambulatory Visit: Payer: Self-pay | Admitting: Dermatology

## 2015-08-29 ENCOUNTER — Encounter: Payer: Self-pay | Admitting: Internal Medicine

## 2015-10-02 ENCOUNTER — Ambulatory Visit: Payer: Self-pay | Admitting: Internal Medicine

## 2015-10-14 ENCOUNTER — Emergency Department (HOSPITAL_COMMUNITY)
Admission: EM | Admit: 2015-10-14 | Discharge: 2015-10-14 | Disposition: A | Discharge status: Home / Self Care | Payer: Medicare Other | Attending: Physician Assistant | Admitting: Physician Assistant

## 2015-10-14 ENCOUNTER — Encounter (HOSPITAL_COMMUNITY): Payer: Self-pay | Admitting: Oncology

## 2015-10-14 ENCOUNTER — Emergency Department (HOSPITAL_COMMUNITY): Payer: Medicare Other

## 2015-10-14 DIAGNOSIS — Z880 Allergy status to penicillin: Secondary | ICD-10-CM | POA: Diagnosis not present

## 2015-10-14 DIAGNOSIS — Z8709 Personal history of other diseases of the respiratory system: Secondary | ICD-10-CM | POA: Insufficient documentation

## 2015-10-14 DIAGNOSIS — Z87440 Personal history of urinary (tract) infections: Secondary | ICD-10-CM | POA: Diagnosis not present

## 2015-10-14 DIAGNOSIS — R011 Cardiac murmur, unspecified: Secondary | ICD-10-CM | POA: Diagnosis not present

## 2015-10-14 DIAGNOSIS — Z8701 Personal history of pneumonia (recurrent): Secondary | ICD-10-CM | POA: Diagnosis not present

## 2015-10-14 DIAGNOSIS — Z8719 Personal history of other diseases of the digestive system: Secondary | ICD-10-CM | POA: Insufficient documentation

## 2015-10-14 DIAGNOSIS — Z8639 Personal history of other endocrine, nutritional and metabolic disease: Secondary | ICD-10-CM | POA: Diagnosis not present

## 2015-10-14 DIAGNOSIS — F419 Anxiety disorder, unspecified: Secondary | ICD-10-CM | POA: Diagnosis not present

## 2015-10-14 DIAGNOSIS — Z7952 Long term (current) use of systemic steroids: Secondary | ICD-10-CM | POA: Diagnosis not present

## 2015-10-14 DIAGNOSIS — S0990XA Unspecified injury of head, initial encounter: Secondary | ICD-10-CM | POA: Insufficient documentation

## 2015-10-14 DIAGNOSIS — Y998 Other external cause status: Secondary | ICD-10-CM | POA: Diagnosis not present

## 2015-10-14 DIAGNOSIS — H9190 Unspecified hearing loss, unspecified ear: Secondary | ICD-10-CM | POA: Insufficient documentation

## 2015-10-14 DIAGNOSIS — M1990 Unspecified osteoarthritis, unspecified site: Secondary | ICD-10-CM | POA: Insufficient documentation

## 2015-10-14 DIAGNOSIS — W19XXXA Unspecified fall, initial encounter: Secondary | ICD-10-CM

## 2015-10-14 DIAGNOSIS — F329 Major depressive disorder, single episode, unspecified: Secondary | ICD-10-CM | POA: Diagnosis not present

## 2015-10-14 DIAGNOSIS — Y9389 Activity, other specified: Secondary | ICD-10-CM | POA: Diagnosis not present

## 2015-10-14 DIAGNOSIS — Y9289 Other specified places as the place of occurrence of the external cause: Secondary | ICD-10-CM | POA: Diagnosis not present

## 2015-10-14 DIAGNOSIS — Z79899 Other long term (current) drug therapy: Secondary | ICD-10-CM | POA: Insufficient documentation

## 2015-10-14 DIAGNOSIS — W01198A Fall on same level from slipping, tripping and stumbling with subsequent striking against other object, initial encounter: Secondary | ICD-10-CM | POA: Insufficient documentation

## 2015-10-14 DIAGNOSIS — S3992XA Unspecified injury of lower back, initial encounter: Secondary | ICD-10-CM | POA: Diagnosis present

## 2015-10-14 DIAGNOSIS — Z9889 Other specified postprocedural states: Secondary | ICD-10-CM | POA: Insufficient documentation

## 2015-10-14 DIAGNOSIS — S3210XA Unspecified fracture of sacrum, initial encounter for closed fracture: Secondary | ICD-10-CM | POA: Insufficient documentation

## 2015-10-14 LAB — CBC WITH DIFFERENTIAL/PLATELET
BASOS PCT: 1 %
Basophils Absolute: 0.1 10*3/uL (ref 0.0–0.1)
EOS ABS: 0.1 10*3/uL (ref 0.0–0.7)
EOS PCT: 1 %
HCT: 38.3 % (ref 36.0–46.0)
HEMOGLOBIN: 12.3 g/dL (ref 12.0–15.0)
LYMPHS ABS: 0.8 10*3/uL (ref 0.7–4.0)
Lymphocytes Relative: 8 %
MCH: 31 pg (ref 26.0–34.0)
MCHC: 32.1 g/dL (ref 30.0–36.0)
MCV: 96.5 fL (ref 78.0–100.0)
MONO ABS: 0.6 10*3/uL (ref 0.1–1.0)
MONOS PCT: 6 %
Neutro Abs: 8.8 10*3/uL — ABNORMAL HIGH (ref 1.7–7.7)
Neutrophils Relative %: 84 %
Platelets: 166 10*3/uL (ref 150–400)
RBC: 3.97 MIL/uL (ref 3.87–5.11)
RDW: 13.3 % (ref 11.5–15.5)
WBC: 10.3 10*3/uL (ref 4.0–10.5)

## 2015-10-14 LAB — BASIC METABOLIC PANEL
Anion gap: 7 (ref 5–15)
BUN: 14 mg/dL (ref 6–20)
CALCIUM: 9.2 mg/dL (ref 8.9–10.3)
CHLORIDE: 107 mmol/L (ref 101–111)
CO2: 31 mmol/L (ref 22–32)
CREATININE: 0.66 mg/dL (ref 0.44–1.00)
GFR calc non Af Amer: >60 mL/min (ref >60)
GLUCOSE: 129 mg/dL — AB (ref 65–99)
Potassium: 4 mmol/L (ref 3.5–5.1)
Sodium: 145 mmol/L (ref 135–145)

## 2015-10-14 MED ORDER — OXYCODONE-ACETAMINOPHEN 5-325 MG PO TABS
1.0000 | ORAL_TABLET | Freq: Once | ORAL | Status: AC
  Administered 2015-10-14: 1 via ORAL
  Filled 2015-10-14: qty 1

## 2015-10-14 MED ORDER — OXYCODONE-ACETAMINOPHEN 5-325 MG PO TABS: 1.0000 | ORAL_TABLET | ORAL | Status: DC | PRN

## 2015-10-14 NOTE — Discharge Instructions (Signed)
Apply ice as much as possible. Get a donut cushion to take pressure off the fractured bone. Activity as tolerated, but always use your walker.   Simple Pelvic Fracture, Adult A pelvic fracture is a break in one of the pelvic bones. The pelvic bones include the bones that you sit on and the bones that make up the lower part of your spine. A pelvic fracture is called simple if the broken bones are stable and are not moving out of place. CAUSES  Common causes of this type of fracture include:  A fall.  A car accident.  Force or pressure applied to the pelvis. RISK FACTORS You may be at higher risk for this type of fracture if:  You play high-impact sports.  You are an older person with a condition that causes weak bones (osteoporosis).  You have a bone-weakening disease. SIGNS AND SYMPTOMS Signs and symptoms may include:  Tenderness, swelling, or bruising in the affected area.  Pain when moving the hip.  Pain when walking or standing. DIAGNOSIS A diagnosis is made with a physical exam and X-rays. Sometimes, a CT scan is also done. TREATMENT The goal of treatment is to get the bones to heal in a good position. Treatment of a simple pelvic fracture usually involves staying in bed (bed rest) and using crutches or a walker until the bones heal. Medicines may be prescribed for pain. Medicines may also be prescribed that help to prevent blood clots from forming in the legs. HOME CARE INSTRUCTIONS Managing Pain, Stiffness, and Swelling  If directed, apply ice to the injured area:  Put ice in a plastic bag.  Place a towel between your skin and the bag.  Leave the ice on for 20 minutes, 2-3 times a day.  Raise the injured area above the level of your heart while you are sitting or lying down. Driving  Do not  drive or operate heavy machinery until your health care provider tells you it is safe to do. Activity  Stay on bed rest for as long as directed by your health care  provider.  While on bed rest:  Change the position of your legs every 1-2 hours. This keeps blood moving well through both of your legs.  You may sit for as long as you feel comfortable.  After bed rest:  Avoid strenuous activities for as long as directed by your health care provider.  Return to your normal activities as directed by your health care provider. Ask your health care provider what activities are safe for you. Safety  Do not use the injured limb to support your body weight until your health care provider says that you can. Use crutches or a walker as directed by your health care provider. General Instructions  Do not use any tobacco products, including cigarettes, chewing tobacco, or electronic cigarettes. Tobacco can delay bone healing. If you need help quitting, ask your health care provider.  Take medicines only as directed by your health care provider.  Keep all follow-up visits as directed by your health care provider. This is important. SEEK MEDICAL CARE IF:  Your pain gets worse.  Your pain is not relieved with medicines. SEEK IMMEDIATE MEDICAL CARE IF:  You feel light-headed or faint.  You develop chest pain.  You develop shortness of breath.  You have a fever.  You have blood in your urine or your stools.  You have vaginal bleeding.  You have difficulty or pain with urination or with a bowel movement.  You have difficulty or increased pain with walking.  You have new or increased swelling in one of your legs.  You have numbness in your legs or groin area.   This information is not intended to replace advice given to you by your health care provider. Make sure you discuss any questions you have with your health care provider.   Document Released: 12/16/2001 Document Revised: 10/28/2014 Document Reviewed: 05/31/2014 Elsevier Interactive Patient Education 2016 [employer]  Acetaminophen; Oxycodone tablets What is this  medicine? ACETAMINOPHEN; OXYCODONE (a set a MEE noe fen; ox i KOE done) is a pain reliever. It is used to treat moderate to severe pain. This medicine may be used for other purposes; ask your health care provider or pharmacist if you have questions. What should I tell my health care provider before I take this medicine? They need to know if you have any of these conditions: -brain tumor -Crohn's disease, inflammatory bowel disease, or ulcerative colitis -drug abuse or addiction -head injury -heart or circulation problems -if you often drink alcohol -kidney disease or problems going to the bathroom -liver disease -lung disease, asthma, or breathing problems -an unusual or allergic reaction to acetaminophen, oxycodone, other opioid analgesics, other medicines, foods, dyes, or preservatives -pregnant or trying to get pregnant -breast-feeding How should I use this medicine? Take this medicine by mouth with a full glass of water. Follow the directions on the prescription label. You can take it with or without food. If it upsets your stomach, take it with food. Take your medicine at regular intervals. Do not take it more often than directed. Talk to your pediatrician regarding the use of this medicine in children. Special care may be needed. Patients over 85 years old may have a stronger reaction and need a smaller dose. Overdosage: If you think you have taken too much of this medicine contact a poison control center or emergency room at once. NOTE: This medicine is only for you. Do not share this medicine with others. What if I miss a dose? If you miss a dose, take it as soon as you can. If it is almost time for your next dose, take only that dose. Do not take double or extra doses. What may interact with this medicine? -alcohol -antihistamines -barbiturates like amobarbital, butalbital, butabarbital, methohexital, pentobarbital, phenobarbital, thiopental, and  secobarbital -benztropine -drugs for bladder problems like solifenacin, trospium, oxybutynin, tolterodine, hyoscyamine, and methscopolamine -drugs for breathing problems like ipratropium and tiotropium -drugs for certain stomach or intestine problems like propantheline, homatropine methylbromide, glycopyrrolate, atropine, belladonna, and dicyclomine -general anesthetics like etomidate, ketamine, nitrous oxide, propofol, desflurane, enflurane, halothane, isoflurane, and sevoflurane -medicines for depression, anxiety, or psychotic disturbances -medicines for sleep -muscle relaxants -naltrexone -narcotic medicines (opiates) for pain -phenothiazines like perphenazine, thioridazine, chlorpromazine, mesoridazine, fluphenazine, prochlorperazine, promazine, and trifluoperazine -scopolamine -tramadol -trihexyphenidyl This list may not describe all possible interactions. Give your health care provider a list of all the medicines, herbs, non-prescription drugs, or dietary supplements you use. Also tell them if you smoke, drink alcohol, or use illegal drugs. Some items may interact with your medicine. What should I watch for while using this medicine? Tell your doctor or health care professional if your pain does not go away, if it gets worse, or if you have new or a different type of pain. You may develop tolerance to the medicine. Tolerance means that you will need a higher dose of the medication for pain relief. Tolerance is normal and is expected if you take  this medicine for a long time. Do not suddenly stop taking your medicine because you may develop a severe reaction. Your body becomes used to the medicine. This does NOT mean you are addicted. Addiction is a behavior related to getting and using a drug for a non-medical reason. If you have pain, you have a medical reason to take pain medicine. Your doctor will tell you how much medicine to take. If your doctor wants you to stop the medicine, the dose  will be slowly lowered over time to avoid any side effects. You may get drowsy or dizzy. Do not drive, use machinery, or do anything that needs mental alertness until you know how this medicine affects you. Do not stand or sit up quickly, especially if you are an older patient. This reduces the risk of dizzy or fainting spells. Alcohol may interfere with the effect of this medicine. Avoid alcoholic drinks. There are different types of narcotic medicines (opiates) for pain. If you take more than one type at the same time, you may have more side effects. Give your health care provider a list of all medicines you use. Your doctor will tell you how much medicine to take. Do not take more medicine than directed. Call emergency for help if you have problems breathing. The medicine will cause constipation. Try to have a bowel movement at least every 2 to 3 days. If you do not have a bowel movement for 3 days, call your doctor or health care professional. Do not take Tylenol (acetaminophen) or medicines that have acetaminophen with this medicine. Too much acetaminophen can be very dangerous. Many nonprescription medicines contain acetaminophen. Always read the labels carefully to avoid taking more acetaminophen. What side effects may I notice from receiving this medicine? Side effects that you should report to your doctor or health care professional as soon as possible: -allergic reactions like skin rash, itching or hives, swelling of the face, lips, or tongue -breathing difficulties, wheezing -confusion -light headedness or fainting spells -severe stomach pain -unusually weak or tired -yellowing of the skin or the whites of the eyes Side effects that usually do not require medical attention (report to your doctor or health care professional if they continue or are bothersome): -dizziness -drowsiness -nausea -vomiting This list may not describe all possible side effects. Call your doctor for medical  advice about side effects. You may report side effects to FDA at 1-800-FDA-1088. Where should I keep my medicine? Keep out of the reach of children. This medicine can be abused. Keep your medicine in a safe place to protect it from theft. Do not share this medicine with anyone. Selling or giving away this medicine is dangerous and against the law. This medicine may cause accidental overdose and death if it taken by other adults, children, or pets. Mix any unused medicine with a substance like cat litter or coffee grounds. Then throw the medicine away in a sealed container like a sealed bag or a coffee can with a lid. Do not use the medicine after the expiration date. Store at room temperature between 20 and 25 degrees C (68 and 77 degrees F). NOTE: This sheet is a summary. It may not cover all possible information. If you have questions about this medicine, talk to your doctor, pharmacist, or health care provider.    2016, Elsevier/[employer]. (2014-09-07 15:18:46)

## 2015-10-14 NOTE — ED Notes (Signed)
For discharge , Pt. Unable to get up out of bed to wheelchair, stated that her left leg hurts bad every time she moves it , felt weak on said leg. MD notified and was in pt.'s room, with order , consult to SW.

## 2015-10-14 NOTE — ED Notes (Signed)
Patient transported to CT 

## 2015-10-14 NOTE — ED Provider Notes (Signed)
Patient is a 80 year old female who fell sustaining sacral fractures. Patient signed out to me to follow up with case management and social work. Patient was not able to walk secondary to pain overnight. Patient saw social work and has declared that she will not go into an assisted living. She does not want rehabilitation. She states that she can walk. We attempted ambulation again. She was able to ambulate to the bathroom with a walker with no issues. Patient has her daughter at bedside and they both feel that she is safe to return home. We have offered rehabilitation but patient is refusing at this time.  Bralynn Donado Randall An, MD 10/14/15 [phone]

## 2015-10-14 NOTE — ED Provider Notes (Addendum)
CSN: [id number]     Arrival date & time 10/14/15  0415 History   First MD Initiated Contact with Patient 10/14/15 0421     Chief Complaint  Patient presents with  . Fall     (Consider location/radiation/quality/duration/timing/severity/associated sxs/prior Treatment) Patient is a 80 y.o. female presenting with fall. The history is provided by the patient.  Fall  She fell getting out of a car yesterday. She got out and turned and realized that she was falling. She did hit the back of her head and also her tailbone. She denies loss of consciousness. She initially did think that she was seriously injured, but tonight she is unable to get out of bed because of pain in her tailbone. She rates pain at 10/10. It is worse with movement. Nothing makes it better. She denies loss of consciousness during the fall and denies nausea or vomiting. She has frequent problems with balance and has had multiple falls. She is not on any anticoagulants or antiplatelet agents.  Past Medical History  Diagnosis Date  . Osteoporosis   . Rheumatoid arthritis(714.0)   . Murmur, cardiac   . Pneumonia   . Anxiety   . Adrenal insufficiency, primary, iatrogenic (HCC)   . Gait instability   . Physical deconditioning   . SOBOE (shortness of breath on exertion)     echo 02/23/13-EF 55-60%, possible bicuspid aortic valve with mod to severed stenosis   . Palpitations   . Chest pain     myoview 04/13/04-inferoapical thinning with a suggestion of mild apical ischemia, EF 66%  . Syncope   . Aortic stenosis, severe 02/26/2013  . Pancreatic insufficiency (HCC) 11/09/2012  . UTI (lower urinary tract infection) 10/01/2011  . Hearing loss 03/25/2013  . Memory deficit 03/25/2013  . Depression 03/25/2013  . Pulmonary fibrosis (HCC) 03/25/2013   Past Surgical History  Procedure Laterality Date  . Knee arthroscopy      2 on left  . Cholecystectomy    . Thyroidectomy    . Abdominal hysterectomy    . Cardiac catheterization  11/08/96     no evidence of CAD, nl LV function  . Shoulder surgery Left   . Right heart catheterization  03/22/2013    Procedure: RIGHT HEART CATH;  Surgeon: [name], MD;  Location: [facility] CATH LAB;  Service: Cardiovascular;;  . Coronary angiogram  03/22/2013    Procedure: CORONARY ANGIOGRAM;  Surgeon: [name], MD;  Location: [facility] CATH LAB;  Service: Cardiovascular;;   Family History  Problem Relation Age of Onset  . Stroke Brother   . Cancer - Colon Father   . Cancer Sister     spine?   Social History  Substance Use Topics  . Smoking status: Never Smoker   . Smokeless tobacco: Never Used  . Alcohol Use: No   OB History    No data available     Review of Systems  All other systems reviewed and are negative.     Allergies  Adhesive and Penicillins  Home Medications   Prior to Admission medications   Medication Sig Start Date End Date Taking? Authorizing Provider  ALPRAZolam Prudy Feeler) 0.5 MG tablet Take 2 tablets (1 mg total) by mouth 3 (three) times daily as needed for anxiety. Patient taking differently: Take 0.25 mg by mouth at bedtime as needed for anxiety or sleep.  03/22/15   Osvaldo Shipper, MD  bisoprolol (ZEBETA) 5 MG tablet Take 1 tablet (5 mg total) by mouth daily. NEED OV. 06/19/15  [name], MD  buPROPion (WELLBUTRIN) 75 MG tablet Take 75 mg by mouth daily. 04/24/15   Historical Provider, MD  clindamycin (CLEOCIN) 150 MG capsule Take 600 mg by mouth as directed. 06/29/15   Historical Provider, MD  D3-50 50000 UNITS capsule Take 50,000 Units by mouth once a week. 06/18/15   Historical Provider, MD  leflunomide (ARAVA) 10 MG tablet Take 10 mg by mouth daily. 07/20/15   Historical Provider, MD  mirtazapine (REMERON) 30 MG tablet Take 30 mg by mouth at bedtime.     Historical Provider, MD  nortriptyline (PAMELOR) 75 MG capsule Take 75 mg by mouth at bedtime.    Historical Provider, MD  oxyCODONE-acetaminophen (PERCOCET) 5-325 MG per tablet Take 1 tablet by mouth every  6 (six) hours as needed. Patient taking differently: Take 1 tablet by mouth 2 (two) times daily as needed for moderate pain.  05/27/15   Richardean Canal, MD  predniSONE (DELTASONE) 5 MG tablet Take 7.5 mg by mouth daily with breakfast.    Historical Provider, MD  tetrahydrozoline (VISINE) 0.05 % ophthalmic solution Place 1 drop into both eyes 2 (two) times daily as needed (dry eyes).    Historical Provider, MD  travoprost, benzalkonium, (TRAVATAN) 0.004 % ophthalmic solution Place 1 drop into both eyes at bedtime.    Historical Provider, MD  vitamin B-12 (CYANOCOBALAMIN) 1000 MCG tablet Take 1 tablet (1,000 mcg total) by mouth daily. 03/22/15   Osvaldo Shipper, MD   BP 149/70 mmHg  Pulse 87  Temp(Src) 98 F (36.7 C) (Oral)  Resp 16  Ht 5\' 3"  (1.6 m)  Wt 120 lb (54.432 kg)  BMI 21.26 kg/m2  SpO2 95% Physical Exam  Nursing note and vitals reviewed.  80 year old female, resting comfortably and in no acute distress. Vital signs are significant for hypertension. Oxygen saturation is 95%, which is normal. Head is normocephalic. Small hematomas present on the occiput. PERRLA, EOMI. Oropharynx is clear. Neck is nontender without adenopathy or JVD. Back is nontender in the thoracic and lumbar areas. There is tenderness over the lower sacrum and coccyx. There is no CVA tenderness. Lungs are clear without rales, wheezes, or rhonchi. Chest is nontender. Heart has regular rate and rhythm without murmur. Abdomen is soft, flat, nontender without masses or hepatosplenomegaly and peristalsis is normoactive. Extremities have 2+ edema, full range of motion is present. Moderate venous stasis changes are present. Skin is warm and dry without rash. Neurologic: Mental status is normal, cranial nerves are intact, there are no motor or sensory deficits.  ED Course  Procedures (including critical care time)  Imaging Review Ct Head Wo Contrast  10/14/2015  CLINICAL DATA:  Status post fall out of car, hitting head  on cement. Concern for head or cervical spine injury. Initial encounter. EXAM: CT HEAD WITHOUT CONTRAST CT CERVICAL SPINE WITHOUT CONTRAST TECHNIQUE: Multidetector CT imaging of the head and cervical spine was performed following the standard protocol without intravenous contrast. Multiplanar CT image reconstructions of the cervical spine were also generated. COMPARISON:  CT of the head performed 03/17/2015, CT of the cervical spine performed 11/11/2011, and MRI of the brain performed 03/18/2015 FINDINGS: CT HEAD FINDINGS There is no evidence of acute infarction, mass lesion, or intra- or extra-axial hemorrhage on CT. Scattered periventricular and subcortical white matter change likely reflects small vessel ischemic microangiopathy. A chronic lacunar infarct is noted at the right thalamus. The posterior fossa, including the cerebellum, brainstem and fourth ventricle, is within normal limits. The third  and lateral ventricles are unremarkable in appearance. The cerebral hemispheres are symmetric in appearance, with normal gray-white differentiation. No mass effect or midline shift is seen. There is no evidence of fracture; visualized osseous structures are unremarkable in appearance. The orbits are within normal limits. The paranasal sinuses and mastoid air cells are well-aerated. No significant soft tissue abnormalities are seen. CT CERVICAL SPINE FINDINGS There is no evidence of fracture or subluxation. Vertebral bodies demonstrate normal height and alignment. Mild multilevel disc space narrowing is noted along the cervical spine, with scattered anterior and posterior disc osteophyte complexes. Prevertebral soft tissues are within normal limits. A 1.4 cm hypodensity is noted at the right thyroid lobe, likely benign given its size. The visualized lung apices are clear. Calcification is noted at the carotid bifurcations bilaterally. IMPRESSION: 1. No evidence of traumatic intracranial is injury or fracture. 2. No  evidence of fracture or subluxation along the cervical spine. 3. Scattered small vessel ischemic microangiopathy. Chronic lacunar infarct at the right thalamus. 4. Calcification at the carotid bifurcations bilaterally. Carotid ultrasound could be considered for further evaluation, when and as deemed clinically appropriate. Electronically Signed   By: Roanna Raider M.D.   On: 10/14/2015 05:27   Ct Cervical Spine Wo Contrast  10/14/2015  CLINICAL DATA:  Status post fall out of car, hitting head on cement. Concern for head or cervical spine injury. Initial encounter. EXAM: CT HEAD WITHOUT CONTRAST CT CERVICAL SPINE WITHOUT CONTRAST TECHNIQUE: Multidetector CT imaging of the head and cervical spine was performed following the standard protocol without intravenous contrast. Multiplanar CT image reconstructions of the cervical spine were also generated. COMPARISON:  CT of the head performed 03/17/2015, CT of the cervical spine performed 11/11/2011, and MRI of the brain performed 03/18/2015 FINDINGS: CT HEAD FINDINGS There is no evidence of acute infarction, mass lesion, or intra- or extra-axial hemorrhage on CT. Scattered periventricular and subcortical white matter change likely reflects small vessel ischemic microangiopathy. A chronic lacunar infarct is noted at the right thalamus. The posterior fossa, including the cerebellum, brainstem and fourth ventricle, is within normal limits. The third and lateral ventricles are unremarkable in appearance. The cerebral hemispheres are symmetric in appearance, with normal gray-white differentiation. No mass effect or midline shift is seen. There is no evidence of fracture; visualized osseous structures are unremarkable in appearance. The orbits are within normal limits. The paranasal sinuses and mastoid air cells are well-aerated. No significant soft tissue abnormalities are seen. CT CERVICAL SPINE FINDINGS There is no evidence of fracture or subluxation. Vertebral bodies  demonstrate normal height and alignment. Mild multilevel disc space narrowing is noted along the cervical spine, with scattered anterior and posterior disc osteophyte complexes. Prevertebral soft tissues are within normal limits. A 1.4 cm hypodensity is noted at the right thyroid lobe, likely benign given its size. The visualized lung apices are clear. Calcification is noted at the carotid bifurcations bilaterally. IMPRESSION: 1. No evidence of traumatic intracranial is injury or fracture. 2. No evidence of fracture or subluxation along the cervical spine. 3. Scattered small vessel ischemic microangiopathy. Chronic lacunar infarct at the right thalamus. 4. Calcification at the carotid bifurcations bilaterally. Carotid ultrasound could be considered for further evaluation, when and as deemed clinically appropriate. Electronically Signed   By: Roanna Raider M.D.   On: 10/14/2015 05:27   Ct Pelvis Wo Contrast  10/14/2015  CLINICAL DATA:  Status post fall out of car, hitting tailbone on cement. Pain radiates down the left leg. Patient could not get out  of bed. Initial encounter. EXAM: CT PELVIS WITHOUT CONTRAST TECHNIQUE: Multidetector CT imaging of the pelvis was performed following the standard protocol without intravenous contrast. COMPARISON:  CT of the abdomen and pelvis from 05/27/2015 FINDINGS: There appears to be a minimally displaced fracture extending across S2 and S3, extending into the posterior elements. This is best characterized on sagittal images. Scattered diverticulosis is noted along the distal sigmoid colon. Visualized small and large bowel loops are otherwise unremarkable. The bladder is moderately distended and grossly unremarkable. The patient's left hip hemiarthroplasty is grossly unremarkable in appearance. There is chronic deformity involving the right superior pubic ramus and left inferior pubic ramus. IMPRESSION: 1. Minimally displaced acute fracture extending across S2 and S3, extending  into the posterior elements. 2. Scattered diverticulosis along the distal sigmoid colon. Electronically Signed   By: Roanna Raider M.D.   On: 10/14/2015 05:33   I have personally reviewed and evaluated these images and lab results as part of my medical decision-making.   MDM   Final diagnoses:  Fall, initial encounter  Closed fracture of sacrum, initial encounter    Fall with injury to her occiput and sacrum/coccyx. She will be sent for CT scans of head and cervical spine and pelvis to evaluate for occult injury. Old records are reviewed and she has 2 recent ED visits for falls.  X-rays confirm a minimally displaced sacral fracture. She is advised to get a doughnut cushion and is given a prescription for oxycodone have acetaminophen for pain. Warned that alcohol make her drowsy and possibly prone to more falls. Also warned about potential for constipation. She is to follow-up with her orthopedic physician in the next week.  Dione Booze, MD 10/14/15 [phone]  Patient was unable to tolerate being placed in a wheelchair to be discharged. She will not be able to be managed at home. Social services consulted to see what can be done regarding appropriate short-term placement.  Dione Booze, MD 10/14/15 [phone]

## 2015-10-14 NOTE — ED Notes (Signed)
Pt was shopping yesterday and fell out of the car hitting her head and tail bone on the cement.  Pt denies LOC.  No anticoagulants.  A&O x 4.  Pt called her daughter at approximately 0330 because she could not get out of bed.  Pain is 10/10 on tailbone radiating down left leg.

## 2015-10-14 NOTE — ED Notes (Signed)
Patient ambulated 80 feet with the assistance of a walker with no problems.

## 2015-10-14 NOTE — ED Notes (Signed)
MD at bedside. 

## 2015-10-14 NOTE — ED Notes (Signed)
[name]/daughter [phone]

## 2015-10-14 NOTE — Clinical Social Work Note (Signed)
Clinical Social Work Assessment  Patient Details  Name: [name] MRN: [id number] Date of Birth: November 29, 1934  Date of referral:  10/14/15               Reason for consult:  Facility Placement, Rule-out Psychosocial                Permission sought to share information with:  Case Manager, Customer service manager, Family Supports Permission granted to share information::  Yes, Verbal Permission Granted  Name::        Agency::     Relationship::     Contact Information:     Housing/Transportation Living arrangements for the past 2 months:  Single Family Home (one level condo) Source of Information:  Adult Children, Patient Patient Interpreter Needed:  None Criminal Activity/Legal Involvement Pertinent to Current Situation/Hospitalization:  No - Comment as needed Significant Relationships:  Adult Children ([name]/daughter [phone]) Lives with:  Self Do you feel safe going back to the place where you live?  Yes Need for family participation in patient care:  Yes (Comment)  Care giving concerns:  Pt lives alone and stated she was unable to ambulate this morning due to 10/10 pain in tailbone after a fall this Wednesday.    Social Worker assessment / plan:  CSW consulted by MD for facility placement because as pt was being discharged this AM, she stated she was unable to ambulate and was not able to transfer to her wheelchair. CSW met with pt and dtr, [name], at bedside. [name] quite knowledgable of her mother's care and involved. [name] has helped pt convert her condo to make more accessible. She visits often and helps her bathe. CSW presented option of short-term SNF placement. Pt adamant she does not want to go. [name] encouraged placement for mother. MD and CSW also encouraged. Tech came back in to assess whether pt could ambulate or not. Pt ambulated 80 feet without assistance with her walker. Pt declines SNF placement. Pt to be discharged.  Employment status:   Retired Science writer) PT Recommendations:  Not assessed at this time Information / Referral to community resources:  [name]  Patient/Family's Response to care:  Pt has been in SNF before and is aware of the services. [name] is also aware. Pt does not demonstrate good insight into her injury and mobility. Pt oriented x4. [name] expresses she would like her mother to go to SNF short term, but is willing to support mother's own decision.  Patient/Family's Understanding of and Emotional Response to Diagnosis, Current Treatment, and Prognosis:  Pt annoyed discussing placement and is adamant she wants to leave. [name] is calm, while she expresses this is not the dispo she wants for her mom, she supports her mother's decision. Both are calm and accepting of d/c home.  Emotional Assessment Appearance:  Appears stated age Attitude/Demeanor/Rapport:  Avoidant (appropriate) Affect (typically observed):  Appropriate Orientation:  Oriented to Self, Oriented to Place, Oriented to  Time, Oriented to Situation Alcohol / Substance use:  Never Used Psych involvement (Current and /or in the community):  No (Comment)  Discharge Needs  Concerns to be addressed:  Patient refuses services Readmission within the last 30 days:  No Current discharge risk:  None Barriers to Discharge:  No Barriers Identified   [place], Leah Bartelt LEWIS, LCSW 10/14/2015, 9:58 AM

## 2015-10-20 ENCOUNTER — Emergency Department (HOSPITAL_COMMUNITY): Payer: Medicare Other

## 2015-10-20 ENCOUNTER — Encounter (HOSPITAL_COMMUNITY): Payer: Self-pay | Admitting: Emergency Medicine

## 2015-10-20 ENCOUNTER — Inpatient Hospital Stay (HOSPITAL_COMMUNITY)
Admission: EM | Admit: 2015-10-20 | Discharge: 2015-10-23 | DRG: 689 | Disposition: A | Discharge status: Home Health | Payer: Medicare Other | Attending: Internal Medicine | Admitting: Internal Medicine

## 2015-10-20 DIAGNOSIS — Y95 Nosocomial condition: Secondary | ICD-10-CM | POA: Diagnosis present

## 2015-10-20 DIAGNOSIS — I5042 Chronic combined systolic (congestive) and diastolic (congestive) heart failure: Secondary | ICD-10-CM | POA: Diagnosis present

## 2015-10-20 DIAGNOSIS — J449 Chronic obstructive pulmonary disease, unspecified: Secondary | ICD-10-CM | POA: Diagnosis present

## 2015-10-20 DIAGNOSIS — M069 Rheumatoid arthritis, unspecified: Secondary | ICD-10-CM | POA: Diagnosis present

## 2015-10-20 DIAGNOSIS — Z823 Family history of stroke: Secondary | ICD-10-CM

## 2015-10-20 DIAGNOSIS — N390 Urinary tract infection, site not specified: Secondary | ICD-10-CM | POA: Diagnosis not present

## 2015-10-20 DIAGNOSIS — Z9109 Other allergy status, other than to drugs and biological substances: Secondary | ICD-10-CM | POA: Diagnosis not present

## 2015-10-20 DIAGNOSIS — F329 Major depressive disorder, single episode, unspecified: Secondary | ICD-10-CM | POA: Diagnosis present

## 2015-10-20 DIAGNOSIS — R269 Unspecified abnormalities of gait and mobility: Secondary | ICD-10-CM | POA: Diagnosis present

## 2015-10-20 DIAGNOSIS — I350 Nonrheumatic aortic (valve) stenosis: Secondary | ICD-10-CM | POA: Diagnosis present

## 2015-10-20 DIAGNOSIS — Z880 Allergy status to penicillin: Secondary | ICD-10-CM | POA: Diagnosis not present

## 2015-10-20 DIAGNOSIS — Z952 Presence of prosthetic heart valve: Secondary | ICD-10-CM | POA: Diagnosis not present

## 2015-10-20 DIAGNOSIS — M810 Age-related osteoporosis without current pathological fracture: Secondary | ICD-10-CM | POA: Diagnosis present

## 2015-10-20 DIAGNOSIS — J440 Chronic obstructive pulmonary disease with acute lower respiratory infection: Secondary | ICD-10-CM | POA: Diagnosis present

## 2015-10-20 DIAGNOSIS — Z79899 Other long term (current) drug therapy: Secondary | ICD-10-CM

## 2015-10-20 DIAGNOSIS — Z800 Family history of malignant neoplasm of digestive organs: Secondary | ICD-10-CM

## 2015-10-20 DIAGNOSIS — E119 Type 2 diabetes mellitus without complications: Secondary | ICD-10-CM | POA: Diagnosis present

## 2015-10-20 DIAGNOSIS — J8410 Pulmonary fibrosis, unspecified: Secondary | ICD-10-CM | POA: Diagnosis present

## 2015-10-20 DIAGNOSIS — Z809 Family history of malignant neoplasm, unspecified: Secondary | ICD-10-CM

## 2015-10-20 DIAGNOSIS — J154 Pneumonia due to other streptococci: Secondary | ICD-10-CM | POA: Diagnosis present

## 2015-10-20 DIAGNOSIS — Z7952 Long term (current) use of systemic steroids: Secondary | ICD-10-CM | POA: Diagnosis not present

## 2015-10-20 DIAGNOSIS — R000 Tachycardia, unspecified: Secondary | ICD-10-CM | POA: Diagnosis present

## 2015-10-20 DIAGNOSIS — R0602 Shortness of breath: Secondary | ICD-10-CM | POA: Diagnosis not present

## 2015-10-20 DIAGNOSIS — K8689 Other specified diseases of pancreas: Secondary | ICD-10-CM | POA: Diagnosis not present

## 2015-10-20 DIAGNOSIS — B955 Unspecified streptococcus as the cause of diseases classified elsewhere: Secondary | ICD-10-CM | POA: Diagnosis present

## 2015-10-20 DIAGNOSIS — J189 Pneumonia, unspecified organism: Secondary | ICD-10-CM | POA: Diagnosis not present

## 2015-10-20 DIAGNOSIS — M25559 Pain in unspecified hip: Secondary | ICD-10-CM | POA: Diagnosis present

## 2015-10-20 DIAGNOSIS — F32A Depression, unspecified: Secondary | ICD-10-CM | POA: Diagnosis present

## 2015-10-20 DIAGNOSIS — H9190 Unspecified hearing loss, unspecified ear: Secondary | ICD-10-CM | POA: Diagnosis present

## 2015-10-20 DIAGNOSIS — E2740 Unspecified adrenocortical insufficiency: Secondary | ICD-10-CM | POA: Diagnosis present

## 2015-10-20 DIAGNOSIS — F419 Anxiety disorder, unspecified: Secondary | ICD-10-CM | POA: Diagnosis present

## 2015-10-20 LAB — CBC WITH DIFFERENTIAL/PLATELET
BASOS ABS: 0 10*3/uL (ref 0.0–0.1)
BASOS PCT: 0 %
Eosinophils Absolute: 0.1 10*3/uL (ref 0.0–0.7)
Eosinophils Relative: 1 %
HEMATOCRIT: 40.6 % (ref 36.0–46.0)
HEMOGLOBIN: 12.9 g/dL (ref 12.0–15.0)
Lymphocytes Relative: 8 %
Lymphs Abs: 0.9 10*3/uL (ref 0.7–4.0)
MCH: 30.1 pg (ref 26.0–34.0)
MCHC: 31.8 g/dL (ref 30.0–36.0)
MCV: 94.6 fL (ref 78.0–100.0)
Monocytes Absolute: 1.4 10*3/uL — ABNORMAL HIGH (ref 0.1–1.0)
Monocytes Relative: 13 %
NEUTROS ABS: 8.9 10*3/uL — AB (ref 1.7–7.7)
NEUTROS PCT: 78 %
Platelets: 170 10*3/uL (ref 150–400)
RBC: 4.29 MIL/uL (ref 3.87–5.11)
RDW: 13.3 % (ref 11.5–15.5)
WBC: 11.3 10*3/uL — ABNORMAL HIGH (ref 4.0–10.5)

## 2015-10-20 LAB — URINE MICROSCOPIC-ADD ON

## 2015-10-20 LAB — URINALYSIS, ROUTINE W REFLEX MICROSCOPIC
BILIRUBIN URINE: NEGATIVE
Glucose, UA: NEGATIVE mg/dL
Ketones, ur: >80 mg/dL — AB
NITRITE: POSITIVE — AB
PH: 6.5 (ref 5.0–8.0)
PROTEIN: NEGATIVE mg/dL
SPECIFIC GRAVITY, URINE: 1.018 (ref 1.005–1.030)

## 2015-10-20 LAB — I-STAT CG4 LACTIC ACID, ED: LACTIC ACID, VENOUS: 0.96 mmol/L (ref 0.5–2.0)

## 2015-10-20 LAB — COMPREHENSIVE METABOLIC PANEL
ALK PHOS: 75 U/L (ref 38–126)
ALT: 12 U/L — ABNORMAL LOW (ref 14–54)
ANION GAP: 10 (ref 5–15)
AST: 16 U/L (ref 15–41)
Albumin: 3.5 g/dL (ref 3.5–5.0)
BUN: 13 mg/dL (ref 6–20)
CALCIUM: 8.5 mg/dL — AB (ref 8.9–10.3)
CO2: 28 mmol/L (ref 22–32)
Chloride: 100 mmol/L — ABNORMAL LOW (ref 101–111)
Creatinine, Ser: 0.57 mg/dL (ref 0.44–1.00)
GFR calc non Af Amer: >60 mL/min (ref >60)
Glucose, Bld: 99 mg/dL (ref 65–99)
Potassium: 3.8 mmol/L (ref 3.5–5.1)
SODIUM: 138 mmol/L (ref 135–145)
TOTAL PROTEIN: 6.3 g/dL — AB (ref 6.5–8.1)
Total Bilirubin: 1.2 mg/dL (ref 0.3–1.2)

## 2015-10-20 LAB — PHOSPHORUS: Phosphorus: 2 mg/dL — ABNORMAL LOW (ref 2.5–4.6)

## 2015-10-20 LAB — MAGNESIUM: MAGNESIUM: 1.4 mg/dL — AB (ref 1.7–2.4)

## 2015-10-20 MED ORDER — LEVOFLOXACIN IN D5W 750 MG/150ML IV SOLN: 750.0000 mg | INTRAVENOUS | Status: DC

## 2015-10-20 MED ORDER — ENOXAPARIN SODIUM 40 MG/0.4ML ~~LOC~~ SOLN
40.0000 mg | Freq: Every day | SUBCUTANEOUS | Status: DC
  Administered 2015-10-20 – 2015-10-22 (×3): 40 mg via SUBCUTANEOUS
  Filled 2015-10-20 (×3): qty 0.4

## 2015-10-20 MED ORDER — GUAIFENESIN-DM 100-10 MG/5ML PO SYRP: 5.0000 mL | ORAL_SOLUTION | ORAL | Status: DC | PRN

## 2015-10-20 MED ORDER — SODIUM CHLORIDE 0.9 % IV BOLUS (SEPSIS)
1000.0000 mL | Freq: Once | INTRAVENOUS | Status: AC
  Administered 2015-10-20: 1000 mL via INTRAVENOUS

## 2015-10-20 MED ORDER — OXYCODONE-ACETAMINOPHEN 5-325 MG PO TABS
1.0000 | ORAL_TABLET | ORAL | Status: DC | PRN
  Administered 2015-10-21 – 2015-10-23 (×4): 1 via ORAL
  Filled 2015-10-20 (×4): qty 1

## 2015-10-20 MED ORDER — PREDNISONE 5 MG PO TABS
7.5000 mg | ORAL_TABLET | Freq: Every day | ORAL | Status: DC
  Administered 2015-10-21: 7.5 mg via ORAL
  Filled 2015-10-20: qty 2

## 2015-10-20 MED ORDER — VITAMIN B-12 1000 MCG PO TABS
1000.0000 ug | ORAL_TABLET | Freq: Every day | ORAL | Status: DC
  Administered 2015-10-21 – 2015-10-23 (×3): 1000 ug via ORAL
  Filled 2015-10-20 (×3): qty 1

## 2015-10-20 MED ORDER — IPRATROPIUM BROMIDE 0.02 % IN SOLN
0.5000 mg | Freq: Four times a day (QID) | RESPIRATORY_TRACT | Status: DC
  Administered 2015-10-20: 0.5 mg via RESPIRATORY_TRACT
  Filled 2015-10-20: qty 2.5

## 2015-10-20 MED ORDER — PANTOPRAZOLE SODIUM 40 MG PO TBEC
40.0000 mg | DELAYED_RELEASE_TABLET | Freq: Every day | ORAL | Status: DC
  Administered 2015-10-21 – 2015-10-23 (×3): 40 mg via ORAL
  Filled 2015-10-20 (×3): qty 1

## 2015-10-20 MED ORDER — ONDANSETRON HCL 4 MG PO TABS: 4.0000 mg | ORAL_TABLET | Freq: Four times a day (QID) | ORAL | Status: DC | PRN

## 2015-10-20 MED ORDER — SODIUM CHLORIDE 0.9 % IJ SOLN
3.0000 mL | Freq: Two times a day (BID) | INTRAMUSCULAR | Status: DC
  Administered 2015-10-21 – 2015-10-22 (×4): 3 mL via INTRAVENOUS

## 2015-10-20 MED ORDER — GUAIFENESIN ER 600 MG PO TB12
600.0000 mg | ORAL_TABLET | Freq: Two times a day (BID) | ORAL | Status: DC
  Administered 2015-10-20 – 2015-10-23 (×6): 600 mg via ORAL
  Filled 2015-10-20 (×6): qty 1

## 2015-10-20 MED ORDER — ONDANSETRON HCL 4 MG/2ML IJ SOLN: 4.0000 mg | Freq: Four times a day (QID) | INTRAMUSCULAR | Status: DC | PRN

## 2015-10-20 MED ORDER — BISOPROLOL FUMARATE 5 MG PO TABS
5.0000 mg | ORAL_TABLET | Freq: Every day | ORAL | Status: DC
  Administered 2015-10-21 – 2015-10-23 (×3): 5 mg via ORAL
  Filled 2015-10-20 (×3): qty 1

## 2015-10-20 MED ORDER — LEVALBUTEROL HCL 0.63 MG/3ML IN NEBU: 0.6300 mg | INHALATION_SOLUTION | RESPIRATORY_TRACT | Status: DC | PRN

## 2015-10-20 MED ORDER — ALPRAZOLAM 0.5 MG PO TABS
0.5000 mg | ORAL_TABLET | Freq: Three times a day (TID) | ORAL | Status: DC | PRN
  Administered 2015-10-22: 0.5 mg via ORAL
  Filled 2015-10-20: qty 1

## 2015-10-20 MED ORDER — NORTRIPTYLINE HCL 25 MG PO CAPS
75.0000 mg | ORAL_CAPSULE | Freq: Every day | ORAL | Status: DC
  Administered 2015-10-20 – 2015-10-22 (×3): 75 mg via ORAL
  Filled 2015-10-20 (×4): qty 3

## 2015-10-20 MED ORDER — BUPROPION HCL 100 MG PO TABS
100.0000 mg | ORAL_TABLET | Freq: Every morning | ORAL | Status: DC
  Administered 2015-10-21 – 2015-10-23 (×3): 100 mg via ORAL
  Filled 2015-10-20 (×3): qty 1

## 2015-10-20 MED ORDER — NAPHAZOLINE HCL 0.1 % OP SOLN: 1.0000 [drp] | Freq: Two times a day (BID) | OPHTHALMIC | Status: DC | PRN

## 2015-10-20 MED ORDER — BUDESONIDE 0.25 MG/2ML IN SUSP
0.2500 mg | Freq: Two times a day (BID) | RESPIRATORY_TRACT | Status: DC
  Administered 2015-10-20 – 2015-10-22 (×5): 0.25 mg via RESPIRATORY_TRACT
  Filled 2015-10-20 (×6): qty 2

## 2015-10-20 MED ORDER — LEFLUNOMIDE 20 MG PO TABS
10.0000 mg | ORAL_TABLET | Freq: Every day | ORAL | Status: DC
  Administered 2015-10-21 – 2015-10-23 (×3): 10 mg via ORAL
  Filled 2015-10-20 (×3): qty 1

## 2015-10-20 MED ORDER — POTASSIUM CHLORIDE IN NACL 20-0.9 MEQ/L-% IV SOLN
INTRAVENOUS | Status: AC
  Administered 2015-10-20: 23:00:00 via INTRAVENOUS
  Filled 2015-10-20: qty 1000

## 2015-10-20 MED ORDER — LEVOFLOXACIN IN D5W 750 MG/150ML IV SOLN
750.0000 mg | Freq: Once | INTRAVENOUS | Status: AC
  Administered 2015-10-20: 750 mg via INTRAVENOUS
  Filled 2015-10-20: qty 150

## 2015-10-20 MED ORDER — LEVALBUTEROL HCL 0.63 MG/3ML IN NEBU
1.2500 mg | INHALATION_SOLUTION | Freq: Four times a day (QID) | RESPIRATORY_TRACT | Status: DC
  Administered 2015-10-20: 1.25 mg via RESPIRATORY_TRACT
  Filled 2015-10-20: qty 6

## 2015-10-20 MED ORDER — DEXTROSE 5 % IV SOLN
1.0000 g | INTRAVENOUS | Status: DC
  Administered 2015-10-20: 1 g via INTRAVENOUS
  Filled 2015-10-20 (×2): qty 10

## 2015-10-20 MED ORDER — VITAMIN D (ERGOCALCIFEROL) 1.25 MG (50000 UNIT) PO CAPS: 50000.0000 [IU] | ORAL_CAPSULE | ORAL | Status: DC

## 2015-10-20 MED ORDER — MIRTAZAPINE 15 MG PO TABS
30.0000 mg | ORAL_TABLET | Freq: Every day | ORAL | Status: DC
  Administered 2015-10-20 – 2015-10-22 (×3): 30 mg via ORAL
  Filled 2015-10-20 (×3): qty 2

## 2015-10-20 MED ORDER — LEVALBUTEROL HCL 0.63 MG/3ML IN NEBU
0.6300 mg | INHALATION_SOLUTION | Freq: Three times a day (TID) | RESPIRATORY_TRACT | Status: DC
  Administered 2015-10-21 – 2015-10-23 (×6): 0.63 mg via RESPIRATORY_TRACT
  Filled 2015-10-20 (×7): qty 3

## 2015-10-20 MED ORDER — LATANOPROST 0.005 % OP SOLN
1.0000 [drp] | Freq: Every day | OPHTHALMIC | Status: DC
  Administered 2015-10-20 – 2015-10-22 (×3): 1 [drp] via OPHTHALMIC
  Filled 2015-10-20: qty 2.5

## 2015-10-20 MED ORDER — METHYLPREDNISOLONE SODIUM SUCC 40 MG IJ SOLR
40.0000 mg | Freq: Once | INTRAMUSCULAR | Status: AC
  Administered 2015-10-20: 40 mg via INTRAVENOUS
  Filled 2015-10-20: qty 1

## 2015-10-20 MED ORDER — IPRATROPIUM BROMIDE 0.02 % IN SOLN
0.5000 mg | Freq: Three times a day (TID) | RESPIRATORY_TRACT | Status: DC
  Administered 2015-10-21 – 2015-10-23 (×6): 0.5 mg via RESPIRATORY_TRACT
  Filled 2015-10-20 (×7): qty 2.5

## 2015-10-20 NOTE — ED Notes (Signed)
HOSPITALIST AT THE BEDSIDE 

## 2015-10-20 NOTE — ED Provider Notes (Signed)
CSN: [id number]     Arrival date & time 10/20/15  1607 History   First MD Initiated Contact with Patient 10/20/15 1712     No chief complaint on file.    (Consider location/radiation/quality/duration/timing/severity/associated sxs/prior Treatment) The history is provided by the patient and a relative.  [name] is a 80 y.o. female hx of UTI, pulmonary fibrosis, who presenting with cough, malodorous urine. Patient has been having productive cough with greenish sputum for last 3-4 days. Patient also has some foul-smelling urine as well. She has been more disoriented than usual. She just has subjective chills as well. Denies any abdominal pain. Of note, patient came here for a fall a week ago and case management and social work saw patient. Patient refused rehab at that time.    Past Medical History  Diagnosis Date  . Osteoporosis   . Rheumatoid arthritis(714.0)   . Murmur, cardiac   . Pneumonia   . Anxiety   . Adrenal insufficiency, primary, iatrogenic (HCC)   . Gait instability   . Physical deconditioning   . SOBOE (shortness of breath on exertion)     echo 02/23/13-EF 55-60%, possible bicuspid aortic valve with mod to severed stenosis   . Palpitations   . Chest pain     myoview 04/13/04-inferoapical thinning with a suggestion of mild apical ischemia, EF 66%  . Syncope   . Aortic stenosis, severe 02/26/2013  . Pancreatic insufficiency (HCC) 11/09/2012  . UTI (lower urinary tract infection) 10/01/2011  . Hearing loss 03/25/2013  . Memory deficit 03/25/2013  . Depression 03/25/2013  . Pulmonary fibrosis (HCC) 03/25/2013   Past Surgical History  Procedure Laterality Date  . Knee arthroscopy      2 on left  . Cholecystectomy    . Thyroidectomy    . Abdominal hysterectomy    . Cardiac catheterization  11/08/96    no evidence of CAD, nl LV function  . Shoulder surgery Left   . Right heart catheterization  03/22/2013    Procedure: RIGHT HEART CATH;  Surgeon: [name], MD;   Location: [facility] CATH LAB;  Service: Cardiovascular;;  . Coronary angiogram  03/22/2013    Procedure: CORONARY ANGIOGRAM;  Surgeon: [name], MD;  Location: [facility] CATH LAB;  Service: Cardiovascular;;   Family History  Problem Relation Age of Onset  . Stroke Brother   . Cancer - Colon Father   . Cancer Sister     spine?   Social History  Substance Use Topics  . Smoking status: Never Smoker   . Smokeless tobacco: Never Used  . Alcohol Use: No   OB History    No data available     Review of Systems  Constitutional: Positive for chills.  Respiratory: Positive for cough.   All other systems reviewed and are negative.     Allergies  Adhesive and Penicillins  Home Medications   Prior to Admission medications   Medication Sig Start Date End Date Taking? Authorizing Provider  ALPRAZolam Prudy Feeler) 1 MG tablet Take 0.5 mg by mouth 3 (three) times daily as needed. Anxiety 10/08/15  Yes Historical Provider, MD  bisoprolol (ZEBETA) 5 MG tablet Take 1 tablet (5 mg total) by mouth daily. NEED OV. 06/19/15  Yes [name], MD  buPROPion (WELLBUTRIN) 100 MG tablet Take 100 mg by mouth every morning. 10/06/15  Yes Historical Provider, MD  D3-50 50000 UNITS capsule Take 50,000 Units by mouth once a week. 06/18/15  Yes Historical Provider, MD  leflunomide (  ARAVA) 10 MG tablet Take 10 mg by mouth daily. 07/20/15  Yes Historical Provider, MD  mirtazapine (REMERON) 30 MG tablet Take 30 mg by mouth at bedtime.    Yes Historical Provider, MD  nortriptyline (PAMELOR) 75 MG capsule Take 75 mg by mouth at bedtime.   Yes Historical Provider, MD  oxyCODONE-acetaminophen (PERCOCET) 5-325 MG tablet Take 1 tablet by mouth every 4 (four) hours as needed for moderate pain. 10/14/15  Yes Dione Booze, MD  predniSONE (DELTASONE) 5 MG tablet Take 7.5 mg by mouth daily with breakfast.   Yes Historical Provider, MD  tetrahydrozoline (VISINE) 0.05 % ophthalmic solution Place 1 drop into both eyes 2 (two) times  daily as needed (dry eyes).   Yes Historical Provider, MD  TRAVATAN Z 0.004 % SOLN ophthalmic solution Place 1 drop into both eyes at bedtime. 09/16/15  Yes Historical Provider, MD  vitamin B-12 (CYANOCOBALAMIN) 1000 MCG tablet Take 1 tablet (1,000 mcg total) by mouth daily. 03/22/15  Yes Osvaldo Shipper, MD   BP 161/65 mmHg  Pulse 107  Temp(Src) 98.8 F (37.1 C) (Oral)  Resp 18  SpO2 96% Physical Exam  Constitutional:  Chronically ill, confused   HENT:  Head: Normocephalic.  Mouth/Throat: Oropharynx is clear and moist.  Eyes: Conjunctivae are normal. Pupils are equal, round, and reactive to light.  Neck: Normal range of motion. Neck supple.  Cardiovascular: Normal rate, regular rhythm and normal heart sounds.   Pulmonary/Chest: Effort normal.  Diminished R base   Abdominal: Soft. Bowel sounds are normal. She exhibits no distension. There is no tenderness. There is no rebound.  Musculoskeletal: Normal range of motion. She exhibits no edema or tenderness.  Neurological: She is alert.  Confused, moving all extremities   Skin: Skin is warm and dry.  Psychiatric:  Unable   Nursing note and vitals reviewed.   ED Course  Procedures (including critical care time) Labs Review Labs Reviewed  COMPREHENSIVE METABOLIC PANEL - Abnormal; Notable for the following:    Chloride 100 (*)    Calcium 8.5 (*)    Total Protein 6.3 (*)    ALT 12 (*)    All other components within normal limits  CBC WITH DIFFERENTIAL/PLATELET - Abnormal; Notable for the following:    WBC 11.3 (*)    Neutro Abs 8.9 (*)    Monocytes Absolute 1.4 (*)    All other components within normal limits  URINALYSIS, ROUTINE W REFLEX MICROSCOPIC (NOT AT [facility]) - Abnormal; Notable for the following:    Color, Urine AMBER (*)    APPearance CLOUDY (*)    Hgb urine dipstick MODERATE (*)    Ketones, ur >80 (*)    Nitrite POSITIVE (*)    Leukocytes, UA SMALL (*)    All other components within normal limits  URINE  MICROSCOPIC-ADD ON - Abnormal; Notable for the following:    Squamous Epithelial / LPF 0-5 (*)    Bacteria, UA MANY (*)    All other components within normal limits  URINE CULTURE  CULTURE, BLOOD (ROUTINE X 2)  CULTURE, BLOOD (ROUTINE X 2)  I-STAT CG4 LACTIC ACID, ED  I-STAT CG4 LACTIC ACID, ED    Imaging Review Dg Chest 2 View  10/20/2015  CLINICAL DATA:  Initial encounter for two-day history of cough with yellow phlegm. EXAM: CHEST  2 VIEW COMPARISON:  05/27/2015.  CT chest from 05/27/2015. FINDINGS: Lungs are hyperexpanded without edema or focal airspace consolidation. No evidence pleural effusion. Nodular density is identified in the infrahilar  region on the right, new in the interval. The cardio pericardial silhouette is enlarged. Stent device identified in the ascending aorta. Bones are diffusely demineralized. IMPRESSION: New right infrahilar density may be related to focal pneumonia. Followup recommended to ensure resolution. Cardiomegaly with emphysema. Electronically Signed   By: Kennith Center M.D.   On: 10/20/2015 17:18   I have personally reviewed and evaluated these images and lab results as part of my medical decision-making.   EKG Interpretation None      MDM   Final diagnoses:  None    [name] is a 80 y.o. female here with cough, dysuria. She is tachycardic and has low grade temp. Meets sepsis criteria. Will get labs, CXR, UA. Will hydrate and reassess.   8:01 PM CXR showed pneumonia. UA + UTI. WBC 11. Borderline ox 92% on RA. Too weak to walk. Also confused. Will admit for CAP, UTI, mild delirium.    Richardean Canal, MD 10/20/15 2002

## 2015-10-20 NOTE — ED Notes (Signed)
REPORT WILL BE GIVEN AT THE BEDSIDE 1428-1. AAOX3. PT IN NO APPARENT DISTRESS OR PAIN. THE OPPORTUNITY TO ASK QUESTIONS WAS PROVIDED.

## 2015-10-20 NOTE — ED Notes (Signed)
Pt complaining of bad productive cough with green mucus. Also complaining of disorientation, with brown-ish malodorous urine. No pain, is complaining of some nausea. Alert and oriented x 4

## 2015-10-20 NOTE — H&P (Addendum)
[facility] History and Physical  [name] PJK:[id number] DOB: 1935/04/07 DOA: 10/20/2015  Referring physician: Chaney Malling, M.D. PCP: [name], MD   Chief Complaint: Shortness of breath.  HPI: [name] is a 80 y.o. female with a past medical history of pulmonary fibrosis,/COPD, osteoporosis, rheumatoid arthritis, anxiety, depression, aortic stenosis (S/P TAVR), type 2 diabetes, pancreatic insufficiency, adrenal insufficiency on chronic prednisone 7.5 mg by mouth daily comes to the emergency department with history of progressively worse productive cough of greenish sputum for several days and also with complaints of mild disorientation per family member and brownish malodorous urine.  Per patient, during the Christmas weekend, she thinks that she may have gotten exposed to her great granddaughter who had a URI and about 2 days later she developed mild rhinorrhea, mild sore throat, followed by cough and dyspnea which has gotten progressively worse. She states that she has been having mild dysuria and urinary discomfort for the past 2-3 days as well. She denies fever, but complains of nausea, chills and fatigue. She denies chest pain, palpitations, dizziness, diaphoresis, PND, orthopnea, but has frequent lower extremity pitting edema. She denies abdominal pain, emesis, diarrhea, constipation, melena or hematochezia.   When seen in the emergency department, she was in no acute distress, denied any other complaints.   Review of Systems:  Constitutional:  Positive for chills, fatigue.  No weight loss, night sweats, Fevers. HEENT:  Positive for rhinorrhea, sore throat as earlier mentioned. Frequent dry mouth and occasional dried eyes. Denies earaches, sneezing, dysphagia Cardio-vascular:  Positive swelling in lower extremities, No chest pain, Orthopnea, PND,  anasarca, dizziness, palpitations  GI:  Positive nausea,  loss of appetite  No heartburn, indigestion,  abdominal pain, vomiting, diarrhea, change in bowel habits. Resp:  Positive for dyspnea, productive cough with greenish sputum, wheezing. No hemoptysis. Skin:  no rash or lesions.  GU:  Positive dysuria, positive change in color of the urine and malodorous urine. No flank pain.  Musculoskeletal:  Frequent arthralgias and occasional back pain. Psych:  No change in mood or affect. History of  depression or anxiety. No memory loss.   Past Medical History  Diagnosis Date  . Osteoporosis   . Rheumatoid arthritis(714.0)   . Murmur, cardiac   . Pneumonia   . Anxiety   . Adrenal insufficiency, primary, iatrogenic (HCC)   . Gait instability   . Physical deconditioning   . SOBOE (shortness of breath on exertion)     echo 02/23/13-EF 55-60%, possible bicuspid aortic valve with mod to severed stenosis   . Palpitations   . Chest pain     myoview 04/13/04-inferoapical thinning with a suggestion of mild apical ischemia, EF 66%  . Syncope   . Aortic stenosis, severe 02/26/2013  . Pancreatic insufficiency (HCC) 11/09/2012  . UTI (lower urinary tract infection) 10/01/2011  . Hearing loss 03/25/2013  . Memory deficit 03/25/2013  . Depression 03/25/2013  . Pulmonary fibrosis (HCC) 03/25/2013   Past Surgical History  Procedure Laterality Date  . Knee arthroscopy      2 on left  . Cholecystectomy    . Thyroidectomy    . Abdominal hysterectomy    . Cardiac catheterization  11/08/96    no evidence of CAD, nl LV function  . Shoulder surgery Left   . Right heart catheterization  03/22/2013    Procedure: RIGHT HEART CATH;  Surgeon: [name], MD;  Location: [facility] CATH LAB;  Service: Cardiovascular;;  . Coronary angiogram  03/22/2013  Procedure: CORONARY ANGIOGRAM;  Surgeon: [name], MD;  Location: [facility] CATH LAB;  Service: Cardiovascular;;   Social History:  reports that she has never smoked. She has never used smokeless tobacco. She reports that she does not drink alcohol or use illicit  drugs.  Allergies  Allergen Reactions  . Adhesive [Tape] Other (See Comments)    Skin peels up.   Marland Kitchen Penicillins Hives    Has patient had a PCN reaction causing immediate rash, facial/tongue/throat swelling, SOB or lightheadedness with hypotension: No Has patient had a PCN reaction causing severe rash involving mucus membranes or skin necrosis: No Has patient had a PCN reaction that required hospitalization No Has patient had a PCN reaction occurring within the last 10 years: No If all of the above answers are "NO", then may proceed with Cephalosporin use.     Family History  Problem Relation Age of Onset  . Stroke Brother   . Cancer - Colon Father   . Cancer Sister     spine?    Prior to Admission medications   Medication Sig Start Date End Date Taking? Authorizing Provider  ALPRAZolam Prudy Feeler) 1 MG tablet Take 0.5 mg by mouth 3 (three) times daily as needed. Anxiety 10/08/15  Yes Historical Provider, MD  bisoprolol (ZEBETA) 5 MG tablet Take 1 tablet (5 mg total) by mouth daily. NEED OV. 06/19/15  Yes [name], MD  buPROPion (WELLBUTRIN) 100 MG tablet Take 100 mg by mouth every morning. 10/06/15  Yes Historical Provider, MD  D3-50 50000 UNITS capsule Take 50,000 Units by mouth once a week. 06/18/15  Yes Historical Provider, MD  leflunomide (ARAVA) 10 MG tablet Take 10 mg by mouth daily. 07/20/15  Yes Historical Provider, MD  mirtazapine (REMERON) 30 MG tablet Take 30 mg by mouth at bedtime.    Yes Historical Provider, MD  nortriptyline (PAMELOR) 75 MG capsule Take 75 mg by mouth at bedtime.   Yes Historical Provider, MD  oxyCODONE-acetaminophen (PERCOCET) 5-325 MG tablet Take 1 tablet by mouth every 4 (four) hours as needed for moderate pain. 10/14/15  Yes Dione Booze, MD  predniSONE (DELTASONE) 5 MG tablet Take 7.5 mg by mouth daily with breakfast.   Yes Historical Provider, MD  tetrahydrozoline (VISINE) 0.05 % ophthalmic solution Place 1 drop into both eyes 2 (two) times daily  as needed (dry eyes).   Yes Historical Provider, MD  TRAVATAN Z 0.004 % SOLN ophthalmic solution Place 1 drop into both eyes at bedtime. 09/16/15  Yes Historical Provider, MD  vitamin B-12 (CYANOCOBALAMIN) 1000 MCG tablet Take 1 tablet (1,000 mcg total) by mouth daily. 03/22/15  Yes Osvaldo Shipper, MD   Physical Exam: Filed Vitals:   10/20/15 1918 10/20/15 1955 10/20/15 2051 10/20/15 2054  BP: 141/93 161/65 147/64 147/64  Pulse: 108 107 108 109  Temp:    98.9 F (37.2 C)  TempSrc:    Oral  Resp: 18 18 18 16   SpO2: 92% 96% 94% 94%    Wt Readings from Last 3 Encounters:  10/14/15 54.432 kg (120 lb)  05/27/15 54.432 kg (120 lb)  03/22/15 57.7 kg (127 lb 3.3 oz)    General:  Appears calm and comfortable Eyes: PERRL, normal lids, irises & conjunctiva ENT: grossly normal hearing, lips and oral mucosa are dry Neck: no LAD, masses or thyromegaly Cardiovascular: Sinus tachycardia, positive 3/6 systolic murmur. 1+ LE edema. Telemetry: Sinus tachycardia Respiratory: Mild wheezing and scattered rhonchi bilaterally right > left, RML and RLL crackles.  Abdomen: Bowel sounds positive, soft, mild suprapubic tenderness, no guarding, no rebound tenderness. Skin: no rash or induration seen on limited exam Musculoskeletal: grossly normal tone BUE/BLE Psychiatric: grossly normal mood and affect, speech fluent and appropriate Neurologic: grossly non-focal.          Labs on Admission:  Basic Metabolic Panel:  Recent Labs Lab 10/14/15 0741 10/20/15 1718  NA 145 138  K 4.0 3.8  CL 107 100*  CO2 31 28  GLUCOSE 129* 99  BUN 14 13  CREATININE 0.66 0.57  CALCIUM 9.2 8.5*   Liver Function Tests:  Recent Labs Lab 10/20/15 1718  AST 16  ALT 12*  ALKPHOS 75  BILITOT 1.2  PROT 6.3*  ALBUMIN 3.5   CBC:  Recent Labs Lab 10/14/15 0741 10/20/15 1718  WBC 10.3 11.3*  NEUTROABS 8.8* 8.9*  HGB 12.3 12.9  HCT 38.3 40.6  MCV 96.5 94.6  PLT 166 170    Radiological Exams on  Admission: Dg Chest 2 View  10/20/2015  CLINICAL DATA:  Initial encounter for two-day history of cough with yellow phlegm. EXAM: CHEST  2 VIEW COMPARISON:  05/27/2015.  CT chest from 05/27/2015. FINDINGS: Lungs are hyperexpanded without edema or focal airspace consolidation. No evidence pleural effusion. Nodular density is identified in the infrahilar region on the right, new in the interval. The cardio pericardial silhouette is enlarged. Stent device identified in the ascending aorta. Bones are diffusely demineralized. IMPRESSION: New right infrahilar density may be related to focal pneumonia. Followup recommended to ensure resolution. Cardiomegaly with emphysema. Electronically Signed   By: Kennith Center M.D.   On: 10/20/2015 17:18     Assessment/Plan Principal Problem:   CAP (community acquired pneumonia) Admit to telemetry. Gentle IV hydration. Continue supplemental oxygen Bronchodilators as needed. Single dose of methylprednisolone 40 mg IVP. Pulmicort via nebulizer twice a day. Continue Levaquin 750 mg every 24 hours. Start Rocephin 1 g every 24 hours. Check sputum Gram stain, culture and sensitivity. Follow-up blood cultures and sensitivity.  Active Problems:   History of Pulmonary fibrosis (HCC) Bronchodilators as needed. As above.    UTI (lower urinary tract infection) Continue IV antibiotics. Follow-up urine culture and sensitivity.    Rheumatoid arthritis involving multiple joints (HCC) Continue about 10 mg by mouth daily. Continue physiological dose prednisone 7.5 mg by mouth daily Continue analgesics as needed.    DM2 (diabetes mellitus, type 2) (HCC) CBG monitoring. Start regular insulin sliding scale if needed.    Depression Stable Continue current antidepressant medications.        S/P TAVR (transcatheter aortic valve replacement) Check echocardiogram.    Hypomagnesemia Replacing with magnesium sulfate 4 g IVP    Hypophosphatemia Replacing with oral  Neutra-phos.     Code Status: Full code. DVT Prophylaxis: Lovenox SQ.  Family Communication:  Disposition Plan: Admit to telemetry for IV antibiotic therapy for several days.  Time spent: 70 minutes were used during the process of this admission.  Bobette Mo [facility] Pager [phone].

## 2015-10-21 ENCOUNTER — Inpatient Hospital Stay (HOSPITAL_COMMUNITY): Payer: Medicare Other

## 2015-10-21 DIAGNOSIS — I350 Nonrheumatic aortic (valve) stenosis: Secondary | ICD-10-CM

## 2015-10-21 DIAGNOSIS — E876 Hypokalemia: Secondary | ICD-10-CM | POA: Insufficient documentation

## 2015-10-21 LAB — GLUCOSE, CAPILLARY
GLUCOSE-CAPILLARY: 98 mg/dL (ref 65–99)
Glucose-Capillary: 148 mg/dL — ABNORMAL HIGH (ref 65–99)
Glucose-Capillary: 101 mg/dL — ABNORMAL HIGH (ref 65–99)
Glucose-Capillary: 88 mg/dL (ref 65–99)

## 2015-10-21 LAB — CBC WITH DIFFERENTIAL/PLATELET
Basophils Absolute: 0 10*3/uL (ref 0.0–0.1)
Basophils Relative: 0 %
EOS PCT: 0 %
Eosinophils Absolute: 0 10*3/uL (ref 0.0–0.7)
HEMATOCRIT: 38 % (ref 36.0–46.0)
Hemoglobin: 12.3 g/dL (ref 12.0–15.0)
Lymphocytes Relative: 5 %
Lymphs Abs: 0.5 10*3/uL — ABNORMAL LOW (ref 0.7–4.0)
MCH: 30.5 pg (ref 26.0–34.0)
MCHC: 32.4 g/dL (ref 30.0–36.0)
MCV: 94.3 fL (ref 78.0–100.0)
MONOS PCT: 2 %
Monocytes Absolute: 0.2 10*3/uL (ref 0.1–1.0)
NEUTROS PCT: 93 %
Neutro Abs: 10.2 10*3/uL — ABNORMAL HIGH (ref 1.7–7.7)
Platelets: 159 10*3/uL (ref 150–400)
RBC: 4.03 MIL/uL (ref 3.87–5.11)
RDW: 13.1 % (ref 11.5–15.5)
WBC: 10.9 10*3/uL — AB (ref 4.0–10.5)

## 2015-10-21 LAB — COMPREHENSIVE METABOLIC PANEL
ALK PHOS: 69 U/L (ref 38–126)
ALT: 10 U/L — AB (ref 14–54)
AST: 13 U/L — AB (ref 15–41)
Albumin: 3 g/dL — ABNORMAL LOW (ref 3.5–5.0)
Anion gap: 12 (ref 5–15)
BILIRUBIN TOTAL: 1 mg/dL (ref 0.3–1.2)
BUN: 9 mg/dL (ref 6–20)
CO2: 25 mmol/L (ref 22–32)
CREATININE: 0.47 mg/dL (ref 0.44–1.00)
Calcium: 8.2 mg/dL — ABNORMAL LOW (ref 8.9–10.3)
Chloride: 103 mmol/L (ref 101–111)
GFR calc Af Amer: >60 mL/min (ref >60)
Glucose, Bld: 121 mg/dL — ABNORMAL HIGH (ref 65–99)
Potassium: 3.8 mmol/L (ref 3.5–5.1)
Sodium: 140 mmol/L (ref 135–145)
TOTAL PROTEIN: 5.8 g/dL — AB (ref 6.5–8.1)

## 2015-10-21 LAB — STREP PNEUMONIAE URINARY ANTIGEN: Strep Pneumo Urinary Antigen: POSITIVE — AB

## 2015-10-21 MED ORDER — K PHOS MONO-SOD PHOS DI & MONO 155-852-130 MG PO TABS
500.0000 mg | ORAL_TABLET | Freq: Three times a day (TID) | ORAL | Status: AC
  Administered 2015-10-21 (×3): 500 mg via ORAL
  Filled 2015-10-21 (×3): qty 2

## 2015-10-21 MED ORDER — INSULIN ASPART 100 UNIT/ML ~~LOC~~ SOLN
0.0000 [IU] | Freq: Three times a day (TID) | SUBCUTANEOUS | Status: DC
  Administered 2015-10-22 (×2): 2 [IU] via SUBCUTANEOUS
  Administered 2015-10-23: 3 [IU] via SUBCUTANEOUS

## 2015-10-21 MED ORDER — PREDNISONE 50 MG PO TABS
50.0000 mg | ORAL_TABLET | Freq: Every day | ORAL | Status: DC
  Administered 2015-10-22 – 2015-10-23 (×2): 50 mg via ORAL
  Filled 2015-10-21 (×2): qty 1

## 2015-10-21 MED ORDER — LEVOFLOXACIN 500 MG PO TABS
500.0000 mg | ORAL_TABLET | Freq: Every day | ORAL | Status: DC
  Administered 2015-10-21 – 2015-10-23 (×3): 500 mg via ORAL
  Filled 2015-10-21 (×3): qty 1

## 2015-10-21 MED ORDER — MAGNESIUM SULFATE 4 GM/100ML IV SOLN
4.0000 g | Freq: Once | INTRAVENOUS | Status: AC
  Administered 2015-10-21: 4 g via INTRAVENOUS
  Filled 2015-10-21: qty 100

## 2015-10-21 NOTE — Progress Notes (Signed)
  Echocardiogram 2D Echocardiogram has been performed.  [name] 10/21/2015, 11:40 AM

## 2015-10-21 NOTE — Progress Notes (Signed)
[address] [name] YFV:[id number] DOB: 1935/07/13 DOA: 10/20/2015 PCP: [name], MD  Brief narrative: 80 y/o ? Admission 03/22/15 for acute metabolic encephalopathy RA on chr prednisone Severe AoS s/p TAVR 07/2013 [facility] H/o Chr Systolic + Diastolic HF DM ty II H/o recurrent Pyelo L4-L5 compression MOd-severe Pulm obstruction noted 2014-noted h/o Pulmonary fibrosis  admitted to [facility] with SOB Found to have HCAP on CXR   Past medical history-As per Problem list Chart reviewed as below-   Consultants:    Procedures: ECHO 12/31 - Mild LVH with LVEF 60-65% and grade 1 diastolic dysfunction. Upper normal left atrial size. TAVR Corevalve in aortic position with mild perivalvular regurgitation as outlined above and normal gradients. Mild tricuspid regurgitation with PASP 32 mmHg.  Antibiotics:  Ceftriaxone 12/30  LEvaquin 12/30   Subjective  Fair Breathing little better-doesn't use home oxygen Small diarrea stool overnight Grandchild who visited recently had a cold   Objective    Interim History:   Telemetry:    Objective: Filed Vitals:   10/20/15 2217 10/20/15 2310 10/20/15 2314 10/21/15 0622  BP: 150/66   125/53  Pulse:    89  Temp: 98.6 F (37 C)   97.3 F (36.3 C)  TempSrc: Oral   Oral  Resp: 19   16  Height:      Weight:      SpO2: 99% 97% 97% 97%    Intake/Output Summary (Last 24 hours) at 10/21/15 0754 Last data filed at 10/21/15 0503  Gross per 24 hour  Intake  542.5 ml  Output   1100 ml  Net -557.5 ml    Exam:  General: frail pelasant anxious Cardiovascular: s1 s 2no m/r/g Respiratory: slight crackels no wheez eno ronchi no tvr Abdomen:  Soft nt nd no rebound Skin intact no le edema Neuro intact  Data Reviewed: Basic Metabolic Panel:  Recent Labs Lab 10/20/15 1718 10/20/15 2126 10/21/15 0558  NA 138  --  140  K 3.8  --  3.8  CL 100*  --  103  CO2 28  --  25  GLUCOSE 99  --  121*  BUN 13  --  9  CREATININE  0.57  --  0.47  CALCIUM 8.5*  --  8.2*  MG  --  1.4*  --   PHOS  --  2.0*  --    Liver Function Tests:  Recent Labs Lab 10/20/15 1718 10/21/15 0558  AST 16 13*  ALT 12* 10*  ALKPHOS 75 69  BILITOT 1.2 1.0  PROT 6.3* 5.8*  ALBUMIN 3.5 3.0*   No results for input(s): LIPASE, AMYLASE in the last 168 hours. No results for input(s): AMMONIA in the last 168 hours. CBC:  Recent Labs Lab 10/20/15 1718 10/21/15 0558  WBC 11.3* 10.9*  NEUTROABS 8.9* 10.2*  HGB 12.9 12.3  HCT 40.6 38.0  MCV 94.6 94.3  PLT 170 159   Cardiac Enzymes: No results for input(s): CKTOTAL, CKMB, CKMBINDEX, TROPONINI in the last 168 hours. BNP: Invalid input(s): POCBNP CBG: No results for input(s): GLUCAP in the last 168 hours.  No results found for this or any previous visit (from the past 240 hour(s)).   Studies:              All Imaging reviewed and is as per above notation   Scheduled Meds: . bisoprolol  5 mg Oral Daily  . budesonide (PULMICORT) nebulizer solution  0.25 mg Nebulization BID  . buPROPion  100 mg Oral q morning -  10a  . cefTRIAXone (ROCEPHIN)  IV  1 g Intravenous Q24H  . enoxaparin (LOVENOX) injection  40 mg Subcutaneous QHS  . guaiFENesin  600 mg Oral BID  . ipratropium  0.5 mg Nebulization TID  . latanoprost  1 drop Both Eyes QHS  . leflunomide  10 mg Oral Daily  . levalbuterol  0.63 mg Nebulization TID  . levofloxacin (LEVAQUIN) IV  750 mg Intravenous Q24H  . mirtazapine  30 mg Oral QHS  . nortriptyline  75 mg Oral QHS  . pantoprazole  40 mg Oral Daily  . phosphorus  500 mg Oral TID  . predniSONE  7.5 mg Oral Q breakfast  . sodium chloride  3 mL Intravenous Q12H  . vitamin B-12  1,000 mcg Oral Daily  . [START ON 10/25/2015] Vitamin D (Ergocalciferol)  50,000 Units Oral Weekly   Continuous Infusions: . 0.9 % NaCl with KCl 20 mEq / L 50 mL/hr at 10/20/15 2300     Assessment/Plan:  1. strep PNA HCAP-continue IV saline 50 cc/hr + K.  Strrep PnA is +.  Continue  levaquin monotherapy.  CBC + DIff am 2. RA on PRednisone-chronicially immunosuppressed.  On ARava 10 daily as well as prednisone 7.5 daily. In ED Solumedrol IV given.  give stress dose 50 mg x 3 days and then back to home dose. 3. Severe AoS s/p TAVR 10/29014-echo shows diastolic dysfunction-clinically not in decompensated hf 4. DM ty ii-monitor on SSI.  Sugars 91-148.  Expect rise of sugars with steroid use.  Not on home DM meds? 5. REcurrent pyelo-monitor for symmtpoms, Levaquin should cover most organism 6. MOd COPD + Pulm fibrosis-see above-OP pulm follow up 7. Bipolar-continue Pamelor 75 qhs, Xanax 0.5 tid, Wellbutrin 100 q am, remeron 30 qhs    Appt with PCP: no Code Status:  Presumed Full code Family Communication: nofamily + Disposition Plan: likely d/c to home 48 hours DVT prophylaxis: SCD Consultants: none  Pleas Koch, MD  [facility] Pager [phone] 10/21/2015, 7:54 AM    LOS: 1 day

## 2015-10-22 LAB — CBC WITH DIFFERENTIAL/PLATELET
BASOS ABS: 0 10*3/uL (ref 0.0–0.1)
Basophils Relative: 0 %
EOS PCT: 1 %
Eosinophils Absolute: 0.1 10*3/uL (ref 0.0–0.7)
HEMATOCRIT: 32.9 % — AB (ref 36.0–46.0)
HEMOGLOBIN: 10.4 g/dL — AB (ref 12.0–15.0)
LYMPHS ABS: 1.8 10*3/uL (ref 0.7–4.0)
LYMPHS PCT: 20 %
MCH: 29.8 pg (ref 26.0–34.0)
MCHC: 31.6 g/dL (ref 30.0–36.0)
MCV: 94.3 fL (ref 78.0–100.0)
Monocytes Absolute: 1 10*3/uL (ref 0.1–1.0)
Monocytes Relative: 11 %
NEUTROS ABS: 6.2 10*3/uL (ref 1.7–7.7)
Neutrophils Relative %: 68 %
PLATELETS: 162 10*3/uL (ref 150–400)
RBC: 3.49 MIL/uL — AB (ref 3.87–5.11)
RDW: 13.4 % (ref 11.5–15.5)
WBC: 9 10*3/uL (ref 4.0–10.5)

## 2015-10-22 LAB — COMPREHENSIVE METABOLIC PANEL
ALK PHOS: 64 U/L (ref 38–126)
ALT: 9 U/L — AB (ref 14–54)
AST: 11 U/L — AB (ref 15–41)
Albumin: 2.6 g/dL — ABNORMAL LOW (ref 3.5–5.0)
Anion gap: 9 (ref 5–15)
BILIRUBIN TOTAL: 0.5 mg/dL (ref 0.3–1.2)
BUN: 15 mg/dL (ref 6–20)
CALCIUM: 8 mg/dL — AB (ref 8.9–10.3)
CHLORIDE: 107 mmol/L (ref 101–111)
CO2: 29 mmol/L (ref 22–32)
CREATININE: 0.51 mg/dL (ref 0.44–1.00)
Glucose, Bld: 106 mg/dL — ABNORMAL HIGH (ref 65–99)
Potassium: 3.5 mmol/L (ref 3.5–5.1)
Sodium: 145 mmol/L (ref 135–145)
Total Protein: 5 g/dL — ABNORMAL LOW (ref 6.5–8.1)

## 2015-10-22 LAB — GLUCOSE, CAPILLARY
GLUCOSE-CAPILLARY: 78 mg/dL (ref 65–99)
GLUCOSE-CAPILLARY: 166 mg/dL — AB (ref 65–99)
Glucose-Capillary: 155 mg/dL — ABNORMAL HIGH (ref 65–99)
Glucose-Capillary: 124 mg/dL — ABNORMAL HIGH (ref 65–99)

## 2015-10-22 NOTE — Progress Notes (Signed)
Pt states pain level decreased to 5/10 after lavender aromatherapy.  Delford Field, RN

## 2015-10-22 NOTE — Progress Notes (Signed)
Pt's pain continues at 9/10 despite Oxy (see MAR). At pt's agreement, 2 drops lavender eo placed on 2x2 gauze in paper med cup and placed @ bedside for aromatherapy.  Delford Field, RN

## 2015-10-22 NOTE — Progress Notes (Signed)
[address] [name] BHA:[id number] DOB: 1935-03-30 DOA: 10/20/2015 PCP: [name], MD  Brief narrative: 81 y/o ? Admission 03/22/15 for acute metabolic encephalopathy RA on chr prednisone Severe AoS s/p TAVR 07/2013 [facility] H/o Chr Systolic + Diastolic HF DM ty II H/o recurrent Pyelo L4-L5 compression MOd-severe Pulm obstruction noted 2014-noted h/o Pulmonary fibrosis  admitted to [facility] with SOB Found to have HCAP on CXR   Past medical history-As per Problem list Chart reviewed as below-   Consultants:    Procedures: ECHO 12/31 - Mild LVH with LVEF 60-65% and grade 1 diastolic dysfunction. Upper normal left atrial size. TAVR Corevalve in aortic position with mild perivalvular regurgitation as outlined above and normal gradients. Mild tricuspid regurgitation with PASP 32 mmHg.  Antibiotics:  Ceftriaxone 12/30  LEvaquin 12/30   Subjective   Hip pain today Daughter relates falls No cp No n/v No sputum   Objective    Interim History:   Telemetry:    Objective: Filed Vitals:   10/21/15 2232 10/22/15 0650 10/22/15 0834 10/22/15 1300  BP: 119/46 126/49  122/50  Pulse: 84 83  82  Temp: 97.4 F (36.3 C) 98.2 F (36.8 C)  97.6 F (36.4 C)  TempSrc: Oral Oral  Oral  Resp: 20 20  20   Height:      Weight:      SpO2: 95% 98% 97% 98%    Intake/Output Summary (Last 24 hours) at 10/22/15 1509 Last data filed at 10/22/15 0900  Gross per 24 hour  Intake   1170 ml  Output      0 ml  Net   1170 ml    Exam:  General: frail  Cardiovascular: s1 s 2no m/r/g Respiratory: ? crackles no wheez eno ronchi no tvr Abdomen:  Soft nt nd no rebound Skin intact no le edema Neuro intact  Data Reviewed: Basic Metabolic Panel:  Recent Labs Lab 10/20/15 1718 10/20/15 2126 10/21/15 0558 10/22/15 0525  NA 138  --  140 145  K 3.8  --  3.8 3.5  CL 100*  --  103 107  CO2 28  --  25 29  GLUCOSE 99  --  121* 106*  BUN 13  --  9 15  CREATININE 0.57  --   0.47 0.51  CALCIUM 8.5*  --  8.2* 8.0*  MG  --  1.4*  --   --   PHOS  --  2.0*  --   --    Liver Function Tests:  Recent Labs Lab 10/20/15 1718 10/21/15 0558 10/22/15 0525  AST 16 13* 11*  ALT 12* 10* 9*  ALKPHOS 75 69 64  BILITOT 1.2 1.0 0.5  PROT 6.3* 5.8* 5.0*  ALBUMIN 3.5 3.0* 2.6*   No results for input(s): LIPASE, AMYLASE in the last 168 hours. No results for input(s): AMMONIA in the last 168 hours. CBC:  Recent Labs Lab 10/20/15 1718 10/21/15 0558 10/22/15 0525  WBC 11.3* 10.9* 9.0  NEUTROABS 8.9* 10.2* 6.2  HGB 12.9 12.3 10.4*  HCT 40.6 38.0 32.9*  MCV 94.6 94.3 94.3  PLT 170 159 162   Cardiac Enzymes: No results for input(s): CKTOTAL, CKMB, CKMBINDEX, TROPONINI in the last 168 hours. BNP: Invalid input(s): POCBNP CBG:  Recent Labs Lab 10/21/15 1223 10/21/15 1637 10/21/15 2232 10/22/15 0737 10/22/15 1209  GLUCAP 148* 101* 88 78 166*    Recent Results (from the past 240 hour(s))  Blood culture (routine x 2)     Status: None (Preliminary result)  Collection Time: 10/20/15  6:13 PM  Result Value Ref Range Status   Specimen Description BLOOD LEFT ANTECUBITAL  Final   Special Requests BOTTLES DRAWN AEROBIC AND ANAEROBIC 5CC  Final   Culture   Final    NO GROWTH 2 DAYS Performed at [facility]    Report Status PENDING  Incomplete  Blood culture (routine x 2)     Status: None (Preliminary result)   Collection Time: 10/20/15  6:16 PM  Result Value Ref Range Status   Specimen Description BLOOD BLOOD RIGHT HAND  Final   Special Requests BOTTLES DRAWN AEROBIC AND ANAEROBIC 5CC  Final   Culture   Final    NO GROWTH 2 DAYS Performed at [facility]    Report Status PENDING  Incomplete  Urine culture     Status: None (Preliminary result)   Collection Time: 10/20/15  7:06 PM  Result Value Ref Range Status   Specimen Description URINE, CATHETERIZED  Final   Special Requests NONE  Final   Culture   Final    >=100,000 COLONIES/mL  GRAM POSITIVE COCCI CULTURE REINCUBATED FOR BETTER GROWTH Performed at [facility]    Report Status PENDING  Incomplete     Studies:              All Imaging reviewed and is as per above notation   Scheduled Meds: . bisoprolol  5 mg Oral Daily  . budesonide (PULMICORT) nebulizer solution  0.25 mg Nebulization BID  . buPROPion  100 mg Oral q morning - 10a  . enoxaparin (LOVENOX) injection  40 mg Subcutaneous QHS  . guaiFENesin  600 mg Oral BID  . insulin aspart  0-9 Units Subcutaneous TID WC  . ipratropium  0.5 mg Nebulization TID  . latanoprost  1 drop Both Eyes QHS  . leflunomide  10 mg Oral Daily  . levalbuterol  0.63 mg Nebulization TID  . levofloxacin  500 mg Oral Daily  . mirtazapine  30 mg Oral QHS  . nortriptyline  75 mg Oral QHS  . pantoprazole  40 mg Oral Daily  . predniSONE  50 mg Oral Q breakfast  . sodium chloride  3 mL Intravenous Q12H  . vitamin B-12  1,000 mcg Oral Daily  . [START ON 10/25/2015] Vitamin D (Ergocalciferol)  50,000 Units Oral Weekly   Continuous Infusions:     Assessment/Plan:  1. strep PNA HCAP-continue IV saline 50 cc/hr + K.  Strrep PnA is +.  Continue levaquin monotherapy.  CBC + DIff am shows wbc11-->9 2. RA on PRednisone-chronicially immunosuppressed.  On ARava 10 daily as well as prednisone 7.5 daily. In ED Solumedrol IV given.  give stress dose 50 mg x 3 days and then back to home dose. 3. Recent falls with pain in sacrum-get eval from PT.   4. Severe AoS s/p TAVR 10/29014-echo shows diastolic dysfunction-clinically not in decompensated hf, echo non-suggestive CHF 5. DM ty ii-monitor on SSI.  Sugars 78-166  Expect rise of sugars with steroid use.   6. Recurrent pyelo-monitor for symmtpoms, Levaquin should cover most organism 7. MOd COPD + Pulm fibrosis-see above-OP pulm follow up 8. Bipolar-continue Pamelor 75 qhs, Xanax 0.5 tid, Wellbutrin 100 q am, remeron 30 qhs    Appt with PCP: no Code Status:  Presumed Full code Family  Communication: nofamily +-spoke to daughter on phone 10/22/15 Disposition Plan: likely d/c to home 48 hours DVT prophylaxis: SCD Consultants: none  Pleas Koch, MD  [facility] Pager [phone]  10/22/2015, 3:09 PM    LOS: 2 days

## 2015-10-23 DIAGNOSIS — J8410 Pulmonary fibrosis, unspecified: Secondary | ICD-10-CM

## 2015-10-23 DIAGNOSIS — J189 Pneumonia, unspecified organism: Secondary | ICD-10-CM

## 2015-10-23 DIAGNOSIS — K8689 Other specified diseases of pancreas: Secondary | ICD-10-CM

## 2015-10-23 LAB — CBC WITH DIFFERENTIAL/PLATELET
BASOS ABS: 0 10*3/uL (ref 0.0–0.1)
Basophils Relative: 0 %
EOS ABS: 0 10*3/uL (ref 0.0–0.7)
EOS PCT: 0 %
HCT: 37 % (ref 36.0–46.0)
Hemoglobin: 11.8 g/dL — ABNORMAL LOW (ref 12.0–15.0)
Lymphocytes Relative: 13 %
Lymphs Abs: 1.2 10*3/uL (ref 0.7–4.0)
MCH: 29.9 pg (ref 26.0–34.0)
MCHC: 31.9 g/dL (ref 30.0–36.0)
MCV: 93.9 fL (ref 78.0–100.0)
Monocytes Absolute: 1.1 10*3/uL — ABNORMAL HIGH (ref 0.1–1.0)
Monocytes Relative: 12 %
Neutro Abs: 6.9 10*3/uL (ref 1.7–7.7)
Neutrophils Relative %: 75 %
PLATELETS: 187 10*3/uL (ref 150–400)
RBC: 3.94 MIL/uL (ref 3.87–5.11)
RDW: 13.2 % (ref 11.5–15.5)
WBC: 9.2 10*3/uL (ref 4.0–10.5)

## 2015-10-23 LAB — COMPREHENSIVE METABOLIC PANEL
ALT: 9 U/L — AB (ref 14–54)
AST: 11 U/L — AB (ref 15–41)
Albumin: 2.9 g/dL — ABNORMAL LOW (ref 3.5–5.0)
Alkaline Phosphatase: 72 U/L (ref 38–126)
Anion gap: 9 (ref 5–15)
BUN: 17 mg/dL (ref 6–20)
CHLORIDE: 104 mmol/L (ref 101–111)
CO2: 31 mmol/L (ref 22–32)
CREATININE: 0.6 mg/dL (ref 0.44–1.00)
Calcium: 9.3 mg/dL (ref 8.9–10.3)
GFR calc non Af Amer: >60 mL/min (ref >60)
Glucose, Bld: 105 mg/dL — ABNORMAL HIGH (ref 65–99)
POTASSIUM: 4.1 mmol/L (ref 3.5–5.1)
SODIUM: 144 mmol/L (ref 135–145)
Total Bilirubin: 0.5 mg/dL (ref 0.3–1.2)
Total Protein: 5.7 g/dL — ABNORMAL LOW (ref 6.5–8.1)

## 2015-10-23 LAB — GLUCOSE, CAPILLARY
GLUCOSE-CAPILLARY: 210 mg/dL — AB (ref 65–99)
Glucose-Capillary: 83 mg/dL (ref 65–99)

## 2015-10-23 LAB — LEGIONELLA ANTIGEN, URINE

## 2015-10-23 MED ORDER — GUAIFENESIN ER 600 MG PO TB12: 600.0000 mg | ORAL_TABLET | Freq: Two times a day (BID) | ORAL | Status: DC

## 2015-10-23 MED ORDER — LEVOFLOXACIN 500 MG PO TABS: 500.0000 mg | ORAL_TABLET | Freq: Every day | ORAL | Status: DC

## 2015-10-23 MED ORDER — PREDNISONE 20 MG PO TABS: ORAL_TABLET | ORAL | Status: DC

## 2015-10-23 MED ORDER — PREDNISONE 5 MG PO TABS: 7.5000 mg | ORAL_TABLET | Freq: Every day | ORAL | Status: DC

## 2015-10-23 NOTE — Care Management Note (Signed)
Case Management Note  Patient Details  Name: SIEDAH SEDOR MRN: [id number] Date of Birth: Jan 30, 1935  Subjective/Objective: [facility] chosen rep Clydie Braun aware of referral & d/c. Await HHPT order.                   Action/Plan:d/c home w/HHPT.   Expected Discharge Date:                  Expected Discharge Plan:  Home w Home Health Services  In-House Referral:     Discharge planning Services  CM Consult  Post Acute Care Choice:    Choice offered to:  Patient  DME Arranged:    DME Agency:     HH Arranged:  PT HH Agency:  [facility]  Status of Service:  Completed, signed off  Medicare Important Message Given:    Date Medicare IM Given:    Medicare IM give by:    Date Additional Medicare IM Given:    Additional Medicare Important Message give by:     If discussed at Long Length of Stay Meetings, dates discussed:    Additional Comments:  Lanier Clam, RN 10/23/2015, 11:29 AM

## 2015-10-23 NOTE — Care Management Note (Signed)
Case Management Note  Patient Details  Name: STEPANIE GRAVER MRN: [id number] Date of Birth: 11/21/1934  Subjective/Objective:     HHPT ordered. D/c home.               Action/Plan:d/c home w/HHPT.   Expected Discharge Date:                  Expected Discharge Plan:  Home w Home Health Services  In-House Referral:     Discharge planning Services  CM Consult  Post Acute Care Choice:    Choice offered to:  Patient  DME Arranged:    DME Agency:     HH Arranged:  PT HH Agency:  [facility]  Status of Service:  Completed, signed off  Medicare Important Message Given:    Date Medicare IM Given:    Medicare IM give by:    Date Additional Medicare IM Given:    Additional Medicare Important Message give by:     If discussed at Long Length of Stay Meetings, dates discussed:    Additional Comments:  Lanier Clam, RN 10/23/2015, 11:30 AM

## 2015-10-23 NOTE — Progress Notes (Signed)
Patient is at baseline orientation, ambulatory with walker. Discharge instruction reviewed with daughter, questions concerns denied

## 2015-10-23 NOTE — Progress Notes (Signed)
Utilization review completed.  

## 2015-10-23 NOTE — Evaluation (Signed)
Physical Therapy Evaluation Patient Details Name: [name] MRN: [id number] DOB: 09-18-1935 Today's Date: 10/23/2015   History of Present Illness  81 yo female admitted with Pna. Hx of RA, pulmonary fibrosis, osteoporosis, gait instability, syncope, aortic stenosis. Recent fall in community just prior to Christmas, per pt-posteior pelvis/sacral area pain.   Clinical Impression  On eval, pt was Min guard assist to stand and ambulate. Walked ~160 feet with RW. Pt c/o posterior pelvis/sacral area pain with mobility ~4/10. Pt tolerated distance well. Recommend HHPT follow up for general strength and balance training.     Follow Up Recommendations Home health PT;Supervision/Assistance - 24 hour    Equipment Recommendations  None recommended by PT    Recommendations for Other Services       Precautions / Restrictions Precautions Precautions: Fall Restrictions Weight Bearing Restrictions: No      Mobility  Bed Mobility               General bed mobility comments: sitting EOB at start of session  Transfers Overall transfer level: Needs assistance Equipment used: Rolling walker (2 wheeled) Transfers: Sit to/from Stand Sit to Stand: Min guard         General transfer comment: Increased time. VCs hand placement  Ambulation/Gait Ambulation/Gait assistance: Min guard Ambulation Distance (Feet): 160 Feet Assistive device: Rolling walker (2 wheeled) Gait Pattern/deviations: Step-through pattern;Decreased stride length;Decreased step length - left;Decreased step length - right     General Gait Details: slow gait speed. Decreased step length bilaterally noted. O2 sats 94% on RA. Pt tolerated distance well.   Stairs            Wheelchair Mobility    Modified Rankin (Stroke Patients Only)       Balance Overall balance assessment: Needs assistance;History of Falls         Standing balance support: During functional activity Standing balance-Leahy Scale:  Fair Standing balance comment: requires use of walker due to balance deficits                             Pertinent Vitals/Pain Pain Assessment: Faces Faces Pain Scale: Hurts little more Pain Location: posterior pelvis, sacral area Pain Descriptors / Indicators: Sore Pain Intervention(s): Monitored during session;Repositioned    Home Living Family/patient expects to be discharged to:: Private residence Living Arrangements: Alone Available Help at Discharge: Family Olegario Messier (daughter)) Type of Home: Apartment Home Access: Stairs to enter Entrance Stairs-Rails: Right;Left Entrance Stairs-Number of Steps: 2 onto porch Home Layout: One level Home Equipment: Environmental consultant - 2 wheels;Walker - 4 wheels      Prior Function Level of Independence: Independent with assistive device(s)         Comments: uses rollator in community. uses RW in home     Hand Dominance        Extremity/Trunk Assessment   Upper Extremity Assessment: Generalized weakness           Lower Extremity Assessment: Generalized weakness      Cervical / Trunk Assessment: Kyphotic  Communication   Communication: No difficulties  Cognition Arousal/Alertness: Awake/alert Behavior During Therapy: WFL for tasks assessed/performed         Memory: Decreased short-term memory              General Comments      Exercises        Assessment/Plan    PT Assessment Patient needs continued PT services  PT Diagnosis Difficulty walking;Generalized  weakness;Acute pain   PT Problem List Decreased strength;Decreased activity tolerance;Decreased balance;Pain;Decreased mobility;Decreased knowledge of use of DME  PT Treatment Interventions Gait training;Functional mobility training;Therapeutic activities;Patient/family education;Therapeutic exercise;DME instruction;Balance training   PT Goals (Current goals can be found in the Care Plan section) Acute Rehab PT Goals Patient Stated Goal: home PT  Goal Formulation: With patient Time For Goal Achievement: 11/06/15 Potential to Achieve Goals: Good    Frequency Min 3X/week   Barriers to discharge        Co-evaluation               End of Session Equipment Utilized During Treatment: Gait belt Activity Tolerance: Patient tolerated treatment well Patient left: in chair;with call bell/phone within reach;with chair alarm set           Time: 4010-2725 PT Time Calculation (min) (ACUTE ONLY): 22 min   Charges:   PT Evaluation $Initial PT Evaluation Tier I: 1 Procedure     PT G Codes:        Rebeca Alert, MPT Pager: [phone]

## 2015-10-24 LAB — URINE CULTURE
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
## 2015-10-25 LAB — CULTURE, BLOOD (ROUTINE X 2)
Culture: NO GROWTH
Culture: NO GROWTH

## 2015-10-31 ENCOUNTER — Ambulatory Visit: Payer: Self-pay | Admitting: Internal Medicine

## 2015-11-24 ENCOUNTER — Ambulatory Visit: Payer: Self-pay | Admitting: Internal Medicine

## 2015-12-06 ENCOUNTER — Ambulatory Visit (INDEPENDENT_AMBULATORY_CARE_PROVIDER_SITE_OTHER): Payer: Medicare Other | Admitting: Internal Medicine

## 2015-12-06 ENCOUNTER — Encounter: Payer: Self-pay | Admitting: Internal Medicine

## 2015-12-06 VITALS — BP 112/62 | HR 80 | Ht 63.0 in | Wt 123.3 lb

## 2015-12-06 DIAGNOSIS — R0602 Shortness of breath: Secondary | ICD-10-CM

## 2015-12-06 DIAGNOSIS — Z952 Presence of prosthetic heart valve: Secondary | ICD-10-CM

## 2015-12-06 DIAGNOSIS — J8410 Pulmonary fibrosis, unspecified: Secondary | ICD-10-CM

## 2015-12-06 DIAGNOSIS — R0902 Hypoxemia: Secondary | ICD-10-CM | POA: Diagnosis not present

## 2015-12-06 DIAGNOSIS — R0600 Dyspnea, unspecified: Secondary | ICD-10-CM | POA: Insufficient documentation

## 2015-12-06 DIAGNOSIS — Z954 Presence of other heart-valve replacement: Secondary | ICD-10-CM | POA: Diagnosis not present

## 2015-12-06 NOTE — Patient Instructions (Signed)
You have been referred to Dr. Kendrick Fries - Pulmonologist   Your physician wants you to follow-up in: 6 months with [name]. You will receive a reminder letter in the mail two months in advance. If you don't receive a letter, please call our office to schedule the follow-up appointment.
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
## 2015-12-19 ENCOUNTER — Ambulatory Visit (INDEPENDENT_AMBULATORY_CARE_PROVIDER_SITE_OTHER): Payer: Medicare Other | Admitting: Emergency Medicine

## 2015-12-19 ENCOUNTER — Encounter: Payer: Self-pay | Admitting: Emergency Medicine

## 2015-12-19 VITALS — BP 114/76 | HR 80 | Ht 63.0 in | Wt 126.0 lb

## 2015-12-19 DIAGNOSIS — J449 Chronic obstructive pulmonary disease, unspecified: Secondary | ICD-10-CM

## 2015-12-19 DIAGNOSIS — J8410 Pulmonary fibrosis, unspecified: Secondary | ICD-10-CM

## 2015-12-19 MED ORDER — AEROCHAMBER MV MISC: Status: DC

## 2015-12-19 NOTE — Patient Instructions (Addendum)
Start using Breo one puff daily. Take this medication for the next 4 weeks. Call her office to let us know if you believe your breathing is benefiting from the inhaler. If so we will continue this.  There is no evidence of rheumatoid associated lung disease on your CT scan of the chest from June 2016 Follow with Dr Delton Coombes in 3 months or sooner if you have any problems.

## 2015-12-19 NOTE — Assessment & Plan Note (Signed)
In absence of significant tobacco exposure expected this is chronic fixed asthma. We have discussed starting a bronchodilator in 2014 but she did not do so at that time. We will now do a trial of Symbicort twice a day for the next month to see if she benefits. If so we will continue this medication. We will ensure that she can actually trigger the device given her significant rheumatoid arthritis.

## 2015-12-19 NOTE — Assessment & Plan Note (Signed)
Unclear to me where this diagnosis came from. There is no evidence of interstitial disease or rheumatoid nodules on her CT scan of the chest, most recently June 2016.

## 2015-12-19 NOTE — Progress Notes (Signed)
Subjective:    Patient ID: [name], female    DOB: 1935-02-14, 81 y.o.   MRN: [id number]  HPI 81 yo never smoker w hx RA, DM, HTN with diastolic dysfunction, adrenal insufficiency on chronic prednisolone. She has severe AS, LVEF ~55% by TTE performed by Dr Rennis Golden 02/23/13, normal RV size and fxn. She is under consideration for TAVR. She had recent L and R cath without significant CAD. She is referred for pre-operative eval and to assess what is likely multi-factorial DOE. She has undergone PFT 03/17/13 that show moderately severe AFL, normal volumes, decreased DLCO that corrects for her Va. CT scan chest 03/30/13 >> no ILD, few scattered small nodules.   She reports DOE, pre-syncope that occurred with exertion. She has heard wheezing w exertion also, was tried on albuterol without any effect.   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   ROV 12/19/15 -- follow-up visit for multifactorial dyspnea in a patient with rheumatoid arthritis on chronic Pred, diabetes, hypertension and aortic stenosis. She is status post a core valve TAVR and has been followed at [facility] and also by Dr Rennis Golden. I saw her last in July 2014 when she underwent pulmonary function testing that was consistent with severe obstructive disease. She has never been on bronchodilators to my knowledge although we did discuss it at that visit. I note that in the chart there is some mention of possible interstitial lung disease associated with her rheumatoid arthritis but this is never been documented by CT scan of the chest. I reviewed her CT scans personally today and there is actually no interstitial disease on her scan from August 2016. She tells me today that she is experiencing intermittent SOB especially w exertion, chores, house cleaning.     Review of Systems  Constitutional: Negative for fever and unexpected weight change.  HENT: Positive for sore throat. Negative for congestion, dental problem, ear pain, nosebleeds,  postnasal drip, rhinorrhea, sinus pressure and sneezing.   Eyes: Negative for redness and itching.  Respiratory: Positive for cough, shortness of breath and wheezing. Negative for chest tightness.   Cardiovascular: Negative for palpitations and leg swelling.  Gastrointestinal: Negative for nausea and vomiting.  Genitourinary: Negative for dysuria.  Musculoskeletal: Negative for joint swelling.  Skin: Negative for rash.  Neurological: Positive for light-headedness. Negative for headaches.  Hematological: Does not bruise/bleed easily.  Psychiatric/Behavioral: Negative for dysphoric mood. The patient is not nervous/anxious.     Past Medical History  Diagnosis Date  . Osteoporosis   . Rheumatoid arthritis(714.0)   . Murmur, cardiac   . Pneumonia   . Anxiety   . Adrenal insufficiency, primary, iatrogenic (HCC)   . Gait instability   . Physical deconditioning   . SOBOE (shortness of breath on exertion)     echo 02/23/13-EF 55-60%, possible bicuspid aortic valve with mod to severed stenosis   . Palpitations   . Chest pain     myoview 04/13/04-inferoapical thinning with a suggestion of mild apical ischemia, EF 66%  . Syncope   . Aortic stenosis, severe 02/26/2013  . Pancreatic insufficiency (HCC) 11/09/2012  . UTI (lower urinary tract infection) 10/01/2011  . Hearing loss 03/25/2013  . Memory deficit 03/25/2013  . Depression 03/25/2013  . Pulmonary fibrosis (HCC) 03/25/2013     Family History  Problem Relation Age of Onset  . Stroke Brother   . Cancer - Colon Father   . Cancer Sister     spine?     Social  History   Social History  . Marital Status: Widowed    Spouse Name: N/A  . Number of Children: 3  . Years of Education: N/A   Occupational History  . retired     Technical sales engineer of [place]   Social History Main Topics  . Smoking status: Never Smoker   . Smokeless tobacco: Never Used  . Alcohol Use: No  . Drug Use: No  . Sexual Activity: No   Other Topics Concern  . Not on file    Social History Narrative     Allergies  Allergen Reactions  . Adhesive [Tape] Other (See Comments)    Skin peels up.   Marland Kitchen Penicillins Hives    Has patient had a PCN reaction causing immediate rash, facial/tongue/throat swelling, SOB or lightheadedness with hypotension: No Has patient had a PCN reaction causing severe rash involving mucus membranes or skin necrosis: No Has patient had a PCN reaction that required hospitalization No Has patient had a PCN reaction occurring within the last 10 years: No If all of the above answers are "NO", then may proceed with Cephalosporin use.      Outpatient Prescriptions Prior to Visit  Medication Sig Dispense Refill  . ALPRAZolam (XANAX) 1 MG tablet Take 0.5 mg by mouth 3 (three) times daily as needed. Anxiety  5  . bisoprolol (ZEBETA) 5 MG tablet Take 1 tablet (5 mg total) by mouth daily. NEED OV. 30 tablet 2  . buPROPion (WELLBUTRIN) 100 MG tablet Take 100 mg by mouth every morning.  5  . D3-50 50000 units capsule Take 50,000 Units by mouth once a week.  3  . leflunomide (ARAVA) 10 MG tablet Take 10 mg by mouth daily.  2  . mirtazapine (REMERON) 30 MG tablet Take 30 mg by mouth at bedtime.     . nortriptyline (PAMELOR) 75 MG capsule Take 75 mg by mouth at bedtime.    Marland Kitchen oxyCODONE-acetaminophen (PERCOCET) 5-325 MG tablet Take 1 tablet by mouth every 4 (four) hours as needed for moderate pain. 20 tablet 0  . predniSONE (DELTASONE) 5 MG tablet Take 1.5 tablets (7.5 mg total) by mouth daily with breakfast. Resume in 4days    . tetrahydrozoline (VISINE) 0.05 % ophthalmic solution Place 1 drop into both eyes 2 (two) times daily as needed (dry eyes).    . TRAVATAN Z 0.004 % SOLN ophthalmic solution Place 1 drop into both eyes at bedtime.  1  . vitamin B-12 (CYANOCOBALAMIN) 1000 MCG tablet Take 1 tablet (1,000 mcg total) by mouth daily. 30 tablet 0   No facility-administered medications prior to visit.          Objective:   Physical Exam Filed  Vitals:   12/19/15 1049  BP: 114/76  Pulse: 80  Height: 5\' 3"  (1.6 m)  Weight: 57.153 kg (126 lb)  SpO2: 95%   Gen: Pleasant, elderly woman, in no distress,  normal affect  ENT: No lesions,  mouth clear,  oropharynx clear, no postnasal drip  Neck: No JVD, no TMG, referred systolic M to neck  Lungs: No use of accessory muscles, clear without rales or rhonchi, no wheezes  Cardiovascular: RRR, harsh systolic M,   Musculoskeletal: Some mild changes in DIP joints, no cyanosis or clubbing  Neuro: alert, oriented x 3, non-focal exam  Skin: Warm, B LE bruises  03/2015 --  COMPARISON: CT abdomen pelvis 03/18/2015  FINDINGS: CT CHEST FINDINGS  Mediastinum/Nodes: 1.5 cm nodule within the right lobe of the thyroid. Stent graft material  within the ascending thoracic aorta. Main pulmonary artery is enlarged. Normal heart size. Mitral annular calcifications.  Lungs/Pleura: Central airways are patent. Bandlike consolidative opacities within the left lower lobe. No pleural effusion or pneumothorax.  CT ABDOMEN AND PELVIS FINDINGS  Hepatobiliary: Liver is normal in size and contour. Patient status post cholecystectomy. Stable too small to characterize low-attenuation lesion posterior right hepatic lobe. Unchanged mild central intrahepatic and extrahepatic biliary ductal dilatation with the common bile duct measuring up to 14 mm, unchanged from prior.  Pancreas: Pancreatic parenchymal atrophy.  Spleen: Grossly stable low-attenuation splenic lesions.  Adrenals/Urinary Tract: Normal adrenal glands. Kidneys enhance symmetrically with contrast. Unchanged 6 mm stone interpolar region left kidney. Stable bilateral renal hypodensities. Urinary bladder is poorly evaluated due to streak artifact.  Stomach/Bowel: No abnormal bowel wall thickening or evidence for bowel obstruction. No free fluid or free intraperitoneal air.  Vascular/Lymphatic: Retro aortic left renal vein.  Normal caliber abdominal aorta. No retroperitoneal adenopathy.  Other: None  Musculoskeletal: Left hip arthroplasty. Lumbar spine degenerative changes. Age-indeterminate left fourth rib fracture (image 11; series 2). Re- demonstrated L4 compression deformity and degenerative changes L2-L3. Re- demonstrated T6 compression deformity. Chronic appearing right pubic ramus fracture.  IMPRESSION: No acute process within the chest, abdomen or pelvis.  Age-indeterminate but likely chronic left fourth rib fracture. Recommend correlation for point tenderness.  Stable common bile duct dilatation status post cholecystectomy.  Probable atelectasis left lung base.  Right thyroid lobe nodule. Consider further evaluation with thyroid ultrasound.      Assessment & Plan:  Pulmonary fibrosis (HCC) Unclear to me where this diagnosis came from. There is no evidence of interstitial disease or rheumatoid nodules on her CT scan of the chest, most recently June 2016.  COPD (chronic obstructive pulmonary disease) In absence of significant tobacco exposure expected this is chronic fixed asthma. We have discussed starting a bronchodilator in 2014 but she did not do so at that time. We will now do a trial of Symbicort twice a day for the next month to see if she benefits. If so we will continue this medication. We will ensure that she can actually trigger the device given her significant rheumatoid arthritis.    Levy Pupa, MD, PhD 12/19/2015, 11:23 AM [facility] [phone] or if no answer [phone]

## 2016-01-04 ENCOUNTER — Other Ambulatory Visit: Payer: Self-pay | Admitting: Internal Medicine

## 2016-01-04 NOTE — Telephone Encounter (Signed)
REFILL 

## 2016-03-19 ENCOUNTER — Ambulatory Visit (INDEPENDENT_AMBULATORY_CARE_PROVIDER_SITE_OTHER): Payer: Medicare Other | Admitting: Emergency Medicine

## 2016-03-19 ENCOUNTER — Encounter: Payer: Self-pay | Admitting: Emergency Medicine

## 2016-03-19 VITALS — BP 118/66 | HR 80 | Ht 62.0 in | Wt 121.0 lb

## 2016-03-19 DIAGNOSIS — J449 Chronic obstructive pulmonary disease, unspecified: Secondary | ICD-10-CM | POA: Diagnosis not present

## 2016-03-19 DIAGNOSIS — M069 Rheumatoid arthritis, unspecified: Secondary | ICD-10-CM

## 2016-03-19 NOTE — Patient Instructions (Signed)
We will not restart inhaled medication at this time Please follow up in one year or sooner if you have any breathing difficulty. When you follow-up we will determine the timing of a repeat chest x-ray and repeat pulmonary function testing

## 2016-03-19 NOTE — Progress Notes (Signed)
Subjective:    Patient ID: [name], female    DOB: 01/10/1935, 81 y.o.   MRN: [id number]  HPI 81 yo never smoker w hx RA, DM, HTN with diastolic dysfunction, adrenal insufficiency on chronic prednisolone. She has severe AS, LVEF ~55% by TTE performed by Dr Rennis Golden 02/23/13, normal RV size and fxn. She is under consideration for TAVR. She had recent L and R cath without significant CAD. She is referred for pre-operative eval and to assess what is likely multi-factorial DOE. She has undergone PFT 03/17/13 that show moderately severe AFL, normal volumes, decreased DLCO that corrects for her Va. CT scan chest 03/30/13 >> no ILD, few scattered small nodules.   She reports DOE, pre-syncope that occurred with exertion. She has heard wheezing w exertion also, was tried on albuterol without any effect.   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   ROV 12/19/15 -- follow-up visit for multifactorial dyspnea in a patient with rheumatoid arthritis on chronic Pred, diabetes, hypertension and aortic stenosis. She is status post a core valve TAVR and has been followed at [facility] and also by Dr Rennis Golden. I saw her last in July 2014 when she underwent pulmonary function testing that was consistent with severe obstructive disease. She has never been on bronchodilators to my knowledge although we did discuss it at that visit. I note that in the chart there is some mention of possible interstitial lung disease associated with her rheumatoid arthritis but this is never been documented by CT scan of the chest. I reviewed her CT scans personally today and there is actually no interstitial disease on her scan from August 2016. She tells me today that she is experiencing intermittent SOB especially w exertion, chores, house cleaning.    ROV 03/19/16 -- patient with a history of suspected COPD and absence of definite tobacco exposure most likely due to chronic fixed asthma. Trial of symbicort. She didn't feel that the  symbicort helped her very much; she did have cough associated with using.     Review of Systems  Constitutional: Negative for fever and unexpected weight change.  HENT: Positive for sore throat. Negative for congestion, dental problem, ear pain, nosebleeds, postnasal drip, rhinorrhea, sinus pressure and sneezing.   Eyes: Negative for redness and itching.  Respiratory: Positive for cough, shortness of breath and wheezing. Negative for chest tightness.   Cardiovascular: Negative for palpitations and leg swelling.  Gastrointestinal: Negative for nausea and vomiting.  Genitourinary: Negative for dysuria.  Musculoskeletal: Negative for joint swelling.  Skin: Negative for rash.  Neurological: Positive for light-headedness. Negative for headaches.  Hematological: Does not bruise/bleed easily.  Psychiatric/Behavioral: Negative for dysphoric mood. The patient is not nervous/anxious.     Past Medical History  Diagnosis Date  . Osteoporosis   . Rheumatoid arthritis(714.0)   . Murmur, cardiac   . Pneumonia   . Anxiety   . Adrenal insufficiency, primary, iatrogenic (HCC)   . Gait instability   . Physical deconditioning   . SOBOE (shortness of breath on exertion)     echo 02/23/13-EF 55-60%, possible bicuspid aortic valve with mod to severed stenosis   . Palpitations   . Chest pain     myoview 04/13/04-inferoapical thinning with a suggestion of mild apical ischemia, EF 66%  . Syncope   . Aortic stenosis, severe 02/26/2013  . Pancreatic insufficiency (HCC) 11/09/2012  . UTI (lower urinary tract infection) 10/01/2011  . Hearing loss 03/25/2013  . Memory deficit 03/25/2013  .  Depression 03/25/2013  . Pulmonary fibrosis (HCC) 03/25/2013     Family History  Problem Relation Age of Onset  . Stroke Brother   . Cancer - Colon Father   . Cancer Sister     spine?     Social History   Social History  . Marital Status: Widowed    Spouse Name: N/A  . Number of Children: 3  . Years of Education: N/A    Occupational History  . retired     Technical sales engineer of [place]   Social History Main Topics  . Smoking status: Never Smoker   . Smokeless tobacco: Never Used  . Alcohol Use: No  . Drug Use: No  . Sexual Activity: No   Other Topics Concern  . Not on file   Social History Narrative     Allergies  Allergen Reactions  . Adhesive [Tape] Other (See Comments)    Skin peels up.   Marland Kitchen Penicillins Hives    Has patient had a PCN reaction causing immediate rash, facial/tongue/throat swelling, SOB or lightheadedness with hypotension: No Has patient had a PCN reaction causing severe rash involving mucus membranes or skin necrosis: No Has patient had a PCN reaction that required hospitalization No Has patient had a PCN reaction occurring within the last 10 years: No If all of the above answers are "NO", then may proceed with Cephalosporin use.      Outpatient Prescriptions Prior to Visit  Medication Sig Dispense Refill  . ALPRAZolam (XANAX) 1 MG tablet Take 0.5 mg by mouth 3 (three) times daily as needed. Anxiety  5  . bisoprolol (ZEBETA) 5 MG tablet TAKE 1 TABLET (5 MG TOTAL) BY MOUTH DAILY. NEED OV. 30 tablet 6  . buPROPion (WELLBUTRIN) 100 MG tablet Take 100 mg by mouth every morning.  5  . D3-50 50000 units capsule Take 50,000 Units by mouth once a week.  3  . leflunomide (ARAVA) 10 MG tablet Take 10 mg by mouth daily.  2  . mirtazapine (REMERON) 30 MG tablet Take 30 mg by mouth at bedtime.     . nortriptyline (PAMELOR) 75 MG capsule Take 75 mg by mouth at bedtime.    Marland Kitchen oxyCODONE-acetaminophen (PERCOCET) 5-325 MG tablet Take 1 tablet by mouth every 4 (four) hours as needed for moderate pain. 20 tablet 0  . predniSONE (DELTASONE) 5 MG tablet Take 1.5 tablets (7.5 mg total) by mouth daily with breakfast. Resume in 4days    . Spacer/Aero-Holding Chambers (AEROCHAMBER MV) inhaler Use as instructed 1 each 0  . tetrahydrozoline (VISINE) 0.05 % ophthalmic solution Place 1 drop into both eyes 2 (two)  times daily as needed (dry eyes).    . TRAVATAN Z 0.004 % SOLN ophthalmic solution Place 1 drop into both eyes at bedtime.  1  . vitamin B-12 (CYANOCOBALAMIN) 1000 MCG tablet Take 1 tablet (1,000 mcg total) by mouth daily. 30 tablet 0   No facility-administered medications prior to visit.          Objective:   Physical Exam Filed Vitals:   03/19/16 1535  BP: 118/66  Pulse: 80  Height: 5\' 2"  (1.575 m)  Weight: 121 lb (54.885 kg)  SpO2: 96%   Gen: Pleasant, elderly woman, in no distress,  normal affect  ENT: No lesions,  mouth clear,  oropharynx clear, no postnasal drip  Neck: No JVD, no TMG, referred systolic M to neck  Lungs: No use of accessory muscles, clear without rales or rhonchi, no wheezes  Cardiovascular: RRR,  harsh systolic M,   Musculoskeletal: Some mild changes in DIP joints, no cyanosis or clubbing  Neuro: alert, oriented x 3, non-focal exam  Skin: Warm, B LE bruises  03/2015 --  COMPARISON: CT abdomen pelvis 03/18/2015  FINDINGS: CT CHEST FINDINGS  Mediastinum/Nodes: 1.5 cm nodule within the right lobe of the thyroid. Stent graft material within the ascending thoracic aorta. Main pulmonary artery is enlarged. Normal heart size. Mitral annular calcifications.  Lungs/Pleura: Central airways are patent. Bandlike consolidative opacities within the left lower lobe. No pleural effusion or pneumothorax.  CT ABDOMEN AND PELVIS FINDINGS  Hepatobiliary: Liver is normal in size and contour. Patient status post cholecystectomy. Stable too small to characterize low-attenuation lesion posterior right hepatic lobe. Unchanged mild central intrahepatic and extrahepatic biliary ductal dilatation with the common bile duct measuring up to 14 mm, unchanged from prior.  Pancreas: Pancreatic parenchymal atrophy.  Spleen: Grossly stable low-attenuation splenic lesions.  Adrenals/Urinary Tract: Normal adrenal glands. Kidneys enhance symmetrically with  contrast. Unchanged 6 mm stone interpolar region left kidney. Stable bilateral renal hypodensities. Urinary bladder is poorly evaluated due to streak artifact.  Stomach/Bowel: No abnormal bowel wall thickening or evidence for bowel obstruction. No free fluid or free intraperitoneal air.  Vascular/Lymphatic: Retro aortic left renal vein. Normal caliber abdominal aorta. No retroperitoneal adenopathy.  Other: None  Musculoskeletal: Left hip arthroplasty. Lumbar spine degenerative changes. Age-indeterminate left fourth rib fracture (image 11; series 2). Re- demonstrated L4 compression deformity and degenerative changes L2-L3. Re- demonstrated T6 compression deformity. Chronic appearing right pubic ramus fracture.  IMPRESSION: No acute process within the chest, abdomen or pelvis.  Age-indeterminate but likely chronic left fourth rib fracture. Recommend correlation for point tenderness.  Stable common bile duct dilatation status post cholecystectomy.  Probable atelectasis left lung base.  Right thyroid lobe nodule. Consider further evaluation with thyroid ultrasound.      Assessment & Plan:  COPD (chronic obstructive pulmonary disease) She does have documented obstruction on palmar function testing from 2014 but she did not seem to benefit from the addition of bronchodilator. At this time we will defer any further trials since her breathing appears to be at baseline as is her functional capacity. We will follow her and her progress in one year. It may be relevant to add bronchodilators at that time.  Rheumatoid arthritis involving multiple joints (HCC) Without any current evidence or radiographic evidence of interstitial lung disease. If her dyspnea worsens then we can evaluate her also for evolving pulmonary hypertension. She will need repeat imaging and pulmonary function testing in 1 year for surveillance   Levy Pupa, MD, PhD 03/19/2016, 4:41 PM [facility] [phone] or if no answer [phone]

## 2016-03-19 NOTE — Assessment & Plan Note (Signed)
She does have documented obstruction on palmar function testing from 2014 but she did not seem to benefit from the addition of bronchodilator. At this time we will defer any further trials since her breathing appears to be at baseline as is her functional capacity. We will follow her and her progress in one year. It may be relevant to add bronchodilators at that time.

## 2016-03-19 NOTE — Assessment & Plan Note (Signed)
Without any current evidence or radiographic evidence of interstitial lung disease. If her dyspnea worsens then we can evaluate her also for evolving pulmonary hypertension. She will need repeat imaging and pulmonary function testing in 1 year for surveillance

## 2016-06-06 ENCOUNTER — Ambulatory Visit: Payer: Self-pay | Admitting: Internal Medicine

## 2016-07-24 ENCOUNTER — Encounter: Payer: Self-pay | Admitting: Internal Medicine

## 2016-07-24 ENCOUNTER — Ambulatory Visit (INDEPENDENT_AMBULATORY_CARE_PROVIDER_SITE_OTHER): Payer: Medicare Other | Admitting: Internal Medicine

## 2016-07-24 VITALS — BP 106/54 | HR 75 | Ht 63.0 in | Wt 129.0 lb

## 2016-07-24 DIAGNOSIS — Z952 Presence of prosthetic heart valve: Secondary | ICD-10-CM

## 2016-07-24 DIAGNOSIS — J8410 Pulmonary fibrosis, unspecified: Secondary | ICD-10-CM | POA: Diagnosis not present

## 2016-07-24 DIAGNOSIS — R0602 Shortness of breath: Secondary | ICD-10-CM

## 2016-07-24 DIAGNOSIS — R42 Dizziness and giddiness: Secondary | ICD-10-CM | POA: Insufficient documentation

## 2016-07-24 DIAGNOSIS — I350 Nonrheumatic aortic (valve) stenosis: Secondary | ICD-10-CM

## 2016-07-24 MED ORDER — BISOPROLOL FUMARATE 5 MG PO TABS: 2.5000 mg | ORAL_TABLET | Freq: Every day | ORAL | 6 refills | Status: AC
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
## 2016-07-24 NOTE — Patient Instructions (Signed)
Medication Instructions:  DECREASE Bisoprolol from 5mg  to 2.5mg  Take half tab of 5mg  by mouth daily  Labwork: None  Testing/Procedures: None   Follow-Up: Your physician recommends that you schedule a follow-up appointment in: 3 months with pharmacist  Your physician wants you to follow-up in: 6 months with Dr . You will receive a reminder letter in the mail two months in advance. If you don't receive a letter, please call our office to schedule the follow-up appointment.  Any Other Special Instructions Will Be Listed Below (If Applicable).     If you need a refill on your cardiac medications before your next appointment, please call your pharmacy.

## 2017-02-19 ENCOUNTER — Other Ambulatory Visit: Payer: Self-pay | Admitting: Dermatology

## 2017-10-19 ENCOUNTER — Encounter (HOSPITAL_COMMUNITY): Payer: Self-pay | Admitting: Emergency Medicine

## 2017-10-19 ENCOUNTER — Inpatient Hospital Stay (HOSPITAL_COMMUNITY)
Admission: EM | Admit: 2017-10-19 | Discharge: 2017-10-22 | DRG: 872 | Disposition: A | Discharge status: Home Health | Payer: Medicare Other | Attending: Internal Medicine | Admitting: Internal Medicine

## 2017-10-19 ENCOUNTER — Other Ambulatory Visit: Payer: Self-pay

## 2017-10-19 ENCOUNTER — Emergency Department (HOSPITAL_COMMUNITY): Payer: Medicare Other

## 2017-10-19 DIAGNOSIS — Z7952 Long term (current) use of systemic steroids: Secondary | ICD-10-CM

## 2017-10-19 DIAGNOSIS — M069 Rheumatoid arthritis, unspecified: Secondary | ICD-10-CM | POA: Diagnosis present

## 2017-10-19 DIAGNOSIS — M810 Age-related osteoporosis without current pathological fracture: Secondary | ICD-10-CM | POA: Diagnosis present

## 2017-10-19 DIAGNOSIS — I9589 Other hypotension: Secondary | ICD-10-CM

## 2017-10-19 DIAGNOSIS — Z91048 Other nonmedicinal substance allergy status: Secondary | ICD-10-CM

## 2017-10-19 DIAGNOSIS — D696 Thrombocytopenia, unspecified: Secondary | ICD-10-CM | POA: Diagnosis present

## 2017-10-19 DIAGNOSIS — I361 Nonrheumatic tricuspid (valve) insufficiency: Secondary | ICD-10-CM | POA: Diagnosis not present

## 2017-10-19 DIAGNOSIS — E890 Postprocedural hypothyroidism: Secondary | ICD-10-CM | POA: Diagnosis present

## 2017-10-19 DIAGNOSIS — N3001 Acute cystitis with hematuria: Secondary | ICD-10-CM

## 2017-10-19 DIAGNOSIS — I248 Other forms of acute ischemic heart disease: Secondary | ICD-10-CM | POA: Diagnosis present

## 2017-10-19 DIAGNOSIS — R7989 Other specified abnormal findings of blood chemistry: Secondary | ICD-10-CM

## 2017-10-19 DIAGNOSIS — R413 Other amnesia: Secondary | ICD-10-CM | POA: Diagnosis present

## 2017-10-19 DIAGNOSIS — E119 Type 2 diabetes mellitus without complications: Secondary | ICD-10-CM | POA: Diagnosis present

## 2017-10-19 DIAGNOSIS — R778 Other specified abnormalities of plasma proteins: Secondary | ICD-10-CM | POA: Diagnosis present

## 2017-10-19 DIAGNOSIS — R748 Abnormal levels of other serum enzymes: Secondary | ICD-10-CM

## 2017-10-19 DIAGNOSIS — E2740 Unspecified adrenocortical insufficiency: Secondary | ICD-10-CM | POA: Diagnosis present

## 2017-10-19 DIAGNOSIS — F0390 Unspecified dementia without behavioral disturbance: Secondary | ICD-10-CM | POA: Diagnosis present

## 2017-10-19 DIAGNOSIS — A419 Sepsis, unspecified organism: Principal | ICD-10-CM

## 2017-10-19 DIAGNOSIS — J8410 Pulmonary fibrosis, unspecified: Secondary | ICD-10-CM | POA: Diagnosis present

## 2017-10-19 DIAGNOSIS — Z952 Presence of prosthetic heart valve: Secondary | ICD-10-CM

## 2017-10-19 DIAGNOSIS — I959 Hypotension, unspecified: Secondary | ICD-10-CM

## 2017-10-19 DIAGNOSIS — Z880 Allergy status to penicillin: Secondary | ICD-10-CM | POA: Diagnosis not present

## 2017-10-19 DIAGNOSIS — E273 Drug-induced adrenocortical insufficiency: Secondary | ICD-10-CM | POA: Diagnosis present

## 2017-10-19 DIAGNOSIS — R410 Disorientation, unspecified: Secondary | ICD-10-CM | POA: Diagnosis not present

## 2017-10-19 DIAGNOSIS — F329 Major depressive disorder, single episode, unspecified: Secondary | ICD-10-CM | POA: Diagnosis present

## 2017-10-19 DIAGNOSIS — N390 Urinary tract infection, site not specified: Secondary | ICD-10-CM

## 2017-10-19 DIAGNOSIS — H9190 Unspecified hearing loss, unspecified ear: Secondary | ICD-10-CM | POA: Diagnosis present

## 2017-10-19 LAB — CBC WITH DIFFERENTIAL/PLATELET
BASOS PCT: 0 %
Basophils Absolute: 0 10*3/uL (ref 0.0–0.1)
Eosinophils Absolute: 0 10*3/uL (ref 0.0–0.7)
Eosinophils Relative: 0 %
HCT: 36.2 % (ref 36.0–46.0)
Hemoglobin: 12.1 g/dL (ref 12.0–15.0)
LYMPHS ABS: 1.4 10*3/uL (ref 0.7–4.0)
Lymphocytes Relative: 8 %
MCH: 32.5 pg (ref 26.0–34.0)
MCHC: 33.4 g/dL (ref 30.0–36.0)
MCV: 97.3 fL (ref 78.0–100.0)
MONO ABS: 1.3 10*3/uL — AB (ref 0.1–1.0)
MONOS PCT: 7 %
NEUTROS ABS: 15 10*3/uL — AB (ref 1.7–7.7)
Neutrophils Relative %: 85 %
Platelets: 134 10*3/uL — ABNORMAL LOW (ref 150–400)
RBC: 3.72 MIL/uL — ABNORMAL LOW (ref 3.87–5.11)
RDW: 14.2 % (ref 11.5–15.5)
WBC: 17.7 10*3/uL — ABNORMAL HIGH (ref 4.0–10.5)

## 2017-10-19 LAB — URINALYSIS, ROUTINE W REFLEX MICROSCOPIC
BILIRUBIN URINE: NEGATIVE
GLUCOSE, UA: NEGATIVE mg/dL
KETONES UR: 20 mg/dL — AB
Nitrite: POSITIVE — AB
PROTEIN: 100 mg/dL — AB
Specific Gravity, Urine: 1.018 (ref 1.005–1.030)
pH: 6 (ref 5.0–8.0)

## 2017-10-19 LAB — COMPREHENSIVE METABOLIC PANEL
ALT: 18 U/L (ref 14–54)
AST: 45 U/L — AB (ref 15–41)
Albumin: 3.4 g/dL — ABNORMAL LOW (ref 3.5–5.0)
Alkaline Phosphatase: 76 U/L (ref 38–126)
Anion gap: 13 (ref 5–15)
BILIRUBIN TOTAL: 1.1 mg/dL (ref 0.3–1.2)
BUN: 21 mg/dL — AB (ref 6–20)
CO2: 23 mmol/L (ref 22–32)
CREATININE: 0.87 mg/dL (ref 0.44–1.00)
Calcium: 8.5 mg/dL — ABNORMAL LOW (ref 8.9–10.3)
Chloride: 100 mmol/L — ABNORMAL LOW (ref 101–111)
GFR calc Af Amer: >60 mL/min (ref >60)
GLUCOSE: 140 mg/dL — AB (ref 65–99)
Potassium: 3.4 mmol/L — ABNORMAL LOW (ref 3.5–5.1)
Sodium: 136 mmol/L (ref 135–145)
TOTAL PROTEIN: 6.7 g/dL (ref 6.5–8.1)

## 2017-10-19 LAB — I-STAT CG4 LACTIC ACID, ED: LACTIC ACID, VENOUS: 2.93 mmol/L — AB (ref 0.5–1.9)

## 2017-10-19 LAB — GLUCOSE, CAPILLARY
GLUCOSE-CAPILLARY: 113 mg/dL — AB (ref 65–99)
GLUCOSE-CAPILLARY: 103 mg/dL — AB (ref 65–99)
Glucose-Capillary: 84 mg/dL (ref 65–99)
Glucose-Capillary: 91 mg/dL (ref 65–99)
Glucose-Capillary: 117 mg/dL — ABNORMAL HIGH (ref 65–99)

## 2017-10-19 LAB — MRSA PCR SCREENING: MRSA by PCR: NEGATIVE

## 2017-10-19 LAB — TROPONIN I
Troponin I: 1.54 ng/mL (ref <0.03)
Troponin I: 1.16 ng/mL (ref <0.03)

## 2017-10-19 LAB — PROTIME-INR
INR: 1.15
Prothrombin Time: 14.6 seconds (ref 11.4–15.2)

## 2017-10-19 LAB — CG4 I-STAT (LACTIC ACID): LACTIC ACID, VENOUS: 1.61 mmol/L (ref 0.5–1.9)

## 2017-10-19 MED ORDER — SODIUM CHLORIDE 0.9 % IV SOLN
1.0000 g | Freq: Two times a day (BID) | INTRAVENOUS | Status: DC
  Administered 2017-10-19 – 2017-10-20 (×4): 1 g via INTRAVENOUS
  Filled 2017-10-19 (×5): qty 1

## 2017-10-19 MED ORDER — SODIUM CHLORIDE 0.9 % IV BOLUS (SEPSIS)
30.0000 mL/kg | Freq: Once | INTRAVENOUS | Status: AC
  Administered 2017-10-19: 1000 mL via INTRAVENOUS

## 2017-10-19 MED ORDER — ACETAMINOPHEN 650 MG RE SUPP: 650.0000 mg | Freq: Four times a day (QID) | RECTAL | Status: DC | PRN

## 2017-10-19 MED ORDER — PREDNISONE 5 MG PO TABS
25.0000 mg | ORAL_TABLET | Freq: Every day | ORAL | Status: DC
  Administered 2017-10-19: 06:00:00 25 mg via ORAL
  Filled 2017-10-19: qty 1

## 2017-10-19 MED ORDER — ENSURE ENLIVE PO LIQD
237.0000 mL | Freq: Two times a day (BID) | ORAL | Status: DC
  Administered 2017-10-19 – 2017-10-20 (×3): 237 mL via ORAL

## 2017-10-19 MED ORDER — VANCOMYCIN HCL IN DEXTROSE 1-5 GM/200ML-% IV SOLN
1000.0000 mg | Freq: Once | INTRAVENOUS | Status: AC
  Administered 2017-10-19: 1000 mg via INTRAVENOUS
  Filled 2017-10-19: qty 200

## 2017-10-19 MED ORDER — ENOXAPARIN SODIUM 40 MG/0.4ML ~~LOC~~ SOLN
40.0000 mg | SUBCUTANEOUS | Status: DC
  Administered 2017-10-19 – 2017-10-22 (×4): 40 mg via SUBCUTANEOUS
  Filled 2017-10-19 (×5): qty 0.4

## 2017-10-19 MED ORDER — INSULIN ASPART 100 UNIT/ML ~~LOC~~ SOLN
0.0000 [IU] | Freq: Three times a day (TID) | SUBCUTANEOUS | Status: DC
  Administered 2017-10-20 (×2): 1 [IU] via SUBCUTANEOUS
  Administered 2017-10-21: 2 [IU] via SUBCUTANEOUS
  Administered 2017-10-21 – 2017-10-22 (×2): 1 [IU] via SUBCUTANEOUS

## 2017-10-19 MED ORDER — ONDANSETRON HCL 4 MG/2ML IJ SOLN: 4.0000 mg | Freq: Four times a day (QID) | INTRAMUSCULAR | Status: DC | PRN

## 2017-10-19 MED ORDER — SODIUM CHLORIDE 0.9 % IV BOLUS (SEPSIS)
500.0000 mL | Freq: Once | INTRAVENOUS | Status: AC
  Administered 2017-10-19: 500 mL via INTRAVENOUS

## 2017-10-19 MED ORDER — CEFEPIME HCL 1 G IJ SOLR
1.0000 g | Freq: Once | INTRAMUSCULAR | Status: AC
  Administered 2017-10-19: 1 g via INTRAVENOUS
  Filled 2017-10-19: qty 1

## 2017-10-19 MED ORDER — ONDANSETRON HCL 4 MG PO TABS: 4.0000 mg | ORAL_TABLET | Freq: Four times a day (QID) | ORAL | Status: DC | PRN

## 2017-10-19 MED ORDER — SODIUM CHLORIDE 0.9 % IV SOLN
INTRAVENOUS | Status: AC
  Administered 2017-10-19: 1000 mL via INTRAVENOUS

## 2017-10-19 MED ORDER — ACETAMINOPHEN 325 MG PO TABS
650.0000 mg | ORAL_TABLET | Freq: Four times a day (QID) | ORAL | Status: DC | PRN
  Administered 2017-10-19 – 2017-10-20 (×2): 650 mg via ORAL
  Filled 2017-10-19 (×2): qty 2

## 2017-10-19 NOTE — ED Notes (Addendum)
Pt has a lactic acid of 2.93 EDP campos made aware

## 2017-10-19 NOTE — Progress Notes (Addendum)
Pharmacy Antibiotic Note  [name] is a 82 y.o. female with altered mental status admitted on 10/19/2017 with UTI.  Pharmacy has been consulted for meropenem dosing.  Plan: Meropenem 1 Gm IV q12h F/u scr/cultures  Weight: 129 lb (58.5 kg)  Temp (24hrs), Avg:98.4 F (36.9 C), Min:97.6 F (36.4 C), Max:99.2 F (37.3 C)  Recent Labs  Lab 10/19/17 0130 10/19/17 0151  WBC 17.7*  --   CREATININE 0.87  --   LATICACIDVEN  --  2.93*    CrCl cannot be calculated (Unknown ideal weight.).    Allergies  Allergen Reactions  . Adhesive [Tape] Other (See Comments)    Skin peels up.   Marland Kitchen Penicillins Hives    Has patient had a PCN reaction causing immediate rash, facial/tongue/throat swelling, SOB or lightheadedness with hypotension: No Has patient had a PCN reaction causing severe rash involving mucus membranes or skin necrosis: No Has patient had a PCN reaction that required hospitalization No Has patient had a PCN reaction occurring within the last 10 years: No If all of the above answers are "NO", then may proceed with Cephalosporin use.     Antimicrobials this admission: 12/30 cefepime >> x1 ED 12/30 vancomycin >> ED 12/30 meropenem >>  Dose adjustments this admission:   Microbiology results:  BCx:   UCx:    Sputum:    MRSA PCR:   Thank you for allowing pharmacy to be a part of this patient's care.  Lorenza Evangelist 10/19/2017 3:28 AM

## 2017-10-19 NOTE — ED Notes (Signed)
ED TO INPATIENT HANDOFF REPORT  Name/Age/Gender [name] 82 y.o. female  Code Status    Code Status Orders  (From admission, onward)        Start     Ordered   10/19/17 0337  Full code  Continuous     10/19/17 0336    Code Status History    Date Active Date Inactive Code Status Order ID Comments User Context   10/20/2015 22:19 10/23/2015 17:13 Full Code [id number]  [name], MD Inpatient   03/17/2015 23:58 03/22/2015 21:25 Full Code [id number]  [name], MD Inpatient   09/09/2014 21:44 09/16/2014 14:04 Full Code [id number]  [name], MD ED   11/09/2013 20:37 11/17/2013 19:40 Full Code [id number]  [name], MD ED   09/16/2013 01:37 09/17/2013 18:49 Full Code [id number]  [name], MD Inpatient   11/12/2011 08:39 11/18/2011 17:41 Full Code [id number]  [name], MD ED   10/01/2011 18:57 10/14/2011 13:15 Full Code [id number]  [name], MD ED      Home/SNF/Other Home  Chief Complaint Fever / Slurred Speech [name] UTI/ Diarrhea /Vomitting   Level of Care/Admitting Diagnosis ED Disposition    ED Disposition Condition [name] [[id number]]  Level of Care: Stepdown [14]  Admit to SDU based on following criteria: Hemodynamic compromise or significant risk of instability:  Patient requiring short term acute titration and management of vasoactive drips, and invasive monitoring (i.e., CVP and Arterial line).  Diagnosis: Sepsis associated hypotension [facility]) [[id number]]  Admitting Physician: Doreatha Massed  Attending Physician: [name] [phone]  Estimated length of stay: past midnight tomorrow  Certification:: I certify this patient will need inpatient services for at least 2 midnights  PT Class (Do Not Modify): Inpatient [101]  PT Acc Code (Do Not Modify): Private [1]       Medical History Past Medical History:  Diagnosis Date  . Adrenal insufficiency, primary, iatrogenic   .  Anxiety   . Aortic stenosis, severe 02/26/2013  . Chest pain    myoview 04/13/04-inferoapical thinning with a suggestion of mild apical ischemia, EF 66%  . Depression 03/25/2013  . Gait instability   . Hearing loss 03/25/2013  . Memory deficit 03/25/2013  . Murmur, cardiac   . Osteoporosis   . Palpitations   . Pancreatic insufficiency 11/09/2012  . Physical deconditioning   . Pneumonia   . Pulmonary fibrosis ([place]) 03/25/2013  . Rheumatoid arthritis(714.0)   . SOBOE (shortness of breath on exertion)    echo 02/23/13-EF 55-60%, possible bicuspid aortic valve with mod to severed stenosis   . Syncope   . UTI (lower urinary tract infection) 10/01/2011    Allergies Allergies  Allergen Reactions  . Adhesive [Tape] Other (See Comments)    Skin peels up.   Marland Kitchen Penicillins Hives    Has patient had a PCN reaction causing immediate rash, facial/tongue/throat swelling, SOB or lightheadedness with hypotension: No Has patient had a PCN reaction causing severe rash involving mucus membranes or skin necrosis: No Has patient had a PCN reaction that required hospitalization No Has patient had a PCN reaction occurring within the last 10 years: No If all of the above answers are "NO", then may proceed with Cephalosporin use.     IV Location/Drains/Wounds Patient Lines/Drains/Airways Status   Active Line/Drains/Airways    None          Labs/Imaging Results for orders placed or performed during  the hospital encounter of 10/19/17 (from the past 48 hour(s))  Urinalysis, Routine w reflex microscopic     Status: Abnormal   Collection Time: 10/19/17 12:54 AM  Result Value Ref Range   Color, Urine AMBER (A) YELLOW    Comment: BIOCHEMICALS MAY BE AFFECTED BY COLOR   APPearance CLOUDY (A) CLEAR   Specific Gravity, Urine 1.018 1.005 - 1.030   pH 6.0 5.0 - 8.0   Glucose, UA NEGATIVE NEGATIVE mg/dL   Hgb urine dipstick MODERATE (A) NEGATIVE   Bilirubin Urine NEGATIVE NEGATIVE   Ketones, ur 20 (A) NEGATIVE  mg/dL   Protein, ur 100 (A) NEGATIVE mg/dL   Nitrite POSITIVE (A) NEGATIVE   Leukocytes, UA LARGE (A) NEGATIVE   RBC / HPF TOO NUMEROUS TO COUNT 0 - 5 RBC/hpf   WBC, UA TOO NUMEROUS TO COUNT 0 - 5 WBC/hpf   Bacteria, UA MANY (A) NONE SEEN   Squamous Epithelial / LPF 0-5 (A) NONE SEEN   WBC Clumps PRESENT    Mucus PRESENT    Hyaline Casts, UA PRESENT    Non Squamous Epithelial 0-5 (A) NONE SEEN  Comprehensive metabolic panel     Status: Abnormal   Collection Time: 10/19/17  1:30 AM  Result Value Ref Range   Sodium 136 135 - 145 mmol/L   Potassium 3.4 (L) 3.5 - 5.1 mmol/L   Chloride 100 (L) 101 - 111 mmol/L   CO2 23 22 - 32 mmol/L   Glucose, Bld 140 (H) 65 - 99 mg/dL   BUN 21 (H) 6 - 20 mg/dL   Creatinine, Ser 0.87 0.44 - 1.00 mg/dL   Calcium 8.5 (L) 8.9 - 10.3 mg/dL   Total Protein 6.7 6.5 - 8.1 g/dL   Albumin 3.4 (L) 3.5 - 5.0 g/dL   AST 45 (H) 15 - 41 U/L   ALT 18 14 - 54 U/L   Alkaline Phosphatase 76 38 - 126 U/L   Total Bilirubin 1.1 0.3 - 1.2 mg/dL   GFR calc non Af Amer >60 >60 mL/min   GFR calc Af Amer >60 >60 mL/min    Comment: (NOTE) The eGFR has been calculated using the CKD EPI equation. This calculation has not been validated in all clinical situations. eGFR's persistently <60 mL/min signify possible Chronic Kidney Disease.    Anion gap 13 5 - 15  CBC with Differential     Status: Abnormal   Collection Time: 10/19/17  1:30 AM  Result Value Ref Range   WBC 17.7 (H) 4.0 - 10.5 K/uL   RBC 3.72 (L) 3.87 - 5.11 MIL/uL   Hemoglobin 12.1 12.0 - 15.0 g/dL   HCT 36.2 36.0 - 46.0 %   MCV 97.3 78.0 - 100.0 fL   MCH 32.5 26.0 - 34.0 pg   MCHC 33.4 30.0 - 36.0 g/dL   RDW 14.2 11.5 - 15.5 %   Platelets 134 (L) 150 - 400 K/uL   Neutrophils Relative % 85 %   Neutro Abs 15.0 (H) 1.7 - 7.7 K/uL   Lymphocytes Relative 8 %   Lymphs Abs 1.4 0.7 - 4.0 K/uL   Monocytes Relative 7 %   Monocytes Absolute 1.3 (H) 0.1 - 1.0 K/uL   Eosinophils Relative 0 %   Eosinophils  Absolute 0.0 0.0 - 0.7 K/uL   Basophils Relative 0 %   Basophils Absolute 0.0 0.0 - 0.1 K/uL  Protime-INR     Status: None   Collection Time: 10/19/17  1:30 AM  Result Value Ref  Range   Prothrombin Time 14.6 11.4 - 15.2 seconds   INR 1.15   I-Stat CG4 Lactic Acid, ED     Status: Abnormal   Collection Time: 10/19/17  1:51 AM  Result Value Ref Range   Lactic Acid, Venous 2.93 (HH) 0.5 - 1.9 mmol/L   Comment NOTIFIED PHYSICIAN   Troponin I     Status: Abnormal   Collection Time: 10/19/17  1:58 AM  Result Value Ref Range   Troponin I 1.54 (HH) <0.03 ng/mL    Comment: CRITICAL RESULT CALLED TO, READ BACK BY AND VERIFIED WITH: T DOSTER,RN _0  10/19/17 MKELLY    Dg Chest 2 View  Result Date: 10/19/2017 CLINICAL DATA:  Acute onset of altered mental status. Vomiting and diarrhea. Urinary incontinence. Fever. EXAM: CHEST  2 VIEW COMPARISON:  Chest radiograph performed 10/03/2015 FINDINGS: The lungs are well-aerated and clear. There is no evidence of focal opacification, pleural effusion or pneumothorax. The cardiomediastinal silhouette is borderline normal in size. A vascular stent is noted overlying the aortic valve. No acute osseous abnormalities are seen. There is chronic superior subluxation of the left humeral head. IMPRESSION: No acute cardiopulmonary process seen. Electronically Signed   By: Garald Balding M.D.   On: 10/19/2017 01:33    Pending Labs Unresulted Labs (From admission, onward)   Start     Ordered   10/19/17 0319  Urine culture  STAT,   STAT     10/19/17 0318   10/19/17 0054  Culture, blood (Routine x 2)  BLOOD CULTURE X 2,   STAT     10/19/17 0054      Vitals/Pain Today's Vitals   10/19/17 0210 10/19/17 0215 10/19/17 0330 10/19/17 0350  BP:  126/60  129/65  Pulse:  92  87  Resp:  19  (!) 21  Temp: 99.2 F (37.3 C)     TempSrc: Rectal     SpO2:  99%  100%  Weight:      Height:   _1  (1.6 m)     Isolation Precautions No active  isolations  Medications Medications  meropenem (MERREM) 1 g in sodium chloride 0.9 % 100 mL IVPB (not administered)  acetaminophen (TYLENOL) tablet 650 mg (not administered)    Or  acetaminophen (TYLENOL) suppository 650 mg (not administered)  ondansetron (ZOFRAN) tablet 4 mg (not administered)    Or  ondansetron (ZOFRAN) injection 4 mg (not administered)  enoxaparin (LOVENOX) injection 40 mg (not administered)  sodium chloride 0.9 % bolus 1,755 mL (0 mL/kg  58.5 kg Intravenous Stopped 10/19/17 0325)  vancomycin (VANCOCIN) IVPB 1000 mg/200 mL premix (0 mg Intravenous Stopped 10/19/17 0325)  ceFEPIme (MAXIPIME) 1 g in dextrose 5 % 50 mL IVPB (0 g Intravenous Stopped 10/19/17 0315)    Mobility manual wheelchair

## 2017-10-19 NOTE — Progress Notes (Signed)
This is a no charge note.  For further details, please see H&P by my partner Dr. Julian Reil from earlier today.  Patient seen and examined.  82 yo F with dementia, home dwelling, RA on daily prednisone, AS s/p TAVR and recurrent ESBL UTI who presents with sepsis from UTI and troponin leak.    Sepsis Source UTI.  Organism unknown.  Lactate cleared.  BP great. Mentation good. -Follow blood and urine cultures -Continue meropenem for now   Troponinemia Peaked, low level, no symptoms.  Type 2 NSTEMI, no further work up.  Adrenal insufficiency -Prednisone dose tripled

## 2017-10-19 NOTE — Progress Notes (Signed)
CRITICAL VALUE ALERT  Critical Value:  Troponin 1.16  Date & Time Notied:  10/19/17 0725  Provider Notified: Troponin  Trending down. Provider already aware of troponin bump  Orders Received/Actions taken:

## 2017-10-19 NOTE — H&P (Addendum)
History and Physical    [name] DOB: 01/31/1935 DOA: 10/19/2017  PCP: Jarome Matin, MD  Patient coming from: Home  I have personally briefly reviewed patient's old medical records in [facility]  Chief Complaint: AMS  HPI: [name] is a 82 y.o. female with medical history significant of severe AS s/p TAVR, RA on chronic steroids, iatrogenic adrenal insufficiency, memory deficit (likely early dementia), pancreatic insufficiency, prior ESBL e.coli UTIs (I count 7 episodes between 2012 and 2015).  Patient brought into ED by family for increased confusion onset yesterday.  Concerned about possible UTI.  Patient with reported vomiting, diarrhea.  Reported fever at home.  Tylenol given at that time.   ED Course: Initial BP 84/45.  Lactate 2.93.  WBC 17.7.  Initially given cefepime and vanc.  UA confirms UTI.  Given 30 cc/kg IVF bolus with good results.  BP now up to 127/59 and patient seems very asymptomatic at the moment  Tm 99.2.  Patient denies CP, SOB, pain anywhere at all.  Admits she cant remember the doses of her home meds.   Review of Systems: Negative, however, patient clearly has some difficulty with memory.  Past Medical History:  Diagnosis Date  . Adrenal insufficiency, primary, iatrogenic   . Anxiety   . Aortic stenosis, severe 02/26/2013  . Chest pain    myoview 04/13/04-inferoapical thinning with a suggestion of mild apical ischemia, EF 66%  . Depression 03/25/2013  . Gait instability   . Hearing loss 03/25/2013  . Memory deficit 03/25/2013  . Murmur, cardiac   . Osteoporosis   . Palpitations   . Pancreatic insufficiency 11/09/2012  . Physical deconditioning   . Pneumonia   . Pulmonary fibrosis ([facility]) 03/25/2013  . Rheumatoid arthritis(714.0)   . SOBOE (shortness of breath on exertion)    echo 02/23/13-EF 55-60%, possible bicuspid aortic valve with mod to severed stenosis   . Syncope   . UTI (lower urinary tract infection) 10/01/2011     Past Surgical History:  Procedure Laterality Date  . ABDOMINAL HYSTERECTOMY    . CARDIAC CATHETERIZATION  11/08/96   no evidence of CAD, nl LV function  . CHOLECYSTECTOMY    . CORONARY ANGIOGRAM  03/22/2013   Procedure: CORONARY ANGIOGRAM;  Surgeon: [name], MD;  Location: [facility] CATH LAB;  Service: Cardiovascular;;  . KNEE ARTHROSCOPY     2 on left  . RIGHT HEART CATHETERIZATION  03/22/2013   Procedure: RIGHT HEART CATH;  Surgeon: [name], MD;  Location: [facility] CATH LAB;  Service: Cardiovascular;;  . SHOULDER SURGERY Left   . THYROIDECTOMY       reports that  has never smoked. she has never used smokeless tobacco. She reports that she does not drink alcohol or use drugs.  Allergies  Allergen Reactions  . Adhesive [Tape] Other (See Comments)    Skin peels up.   Marland Kitchen Penicillins Hives    Has patient had a PCN reaction causing immediate rash, facial/tongue/throat swelling, SOB or lightheadedness with hypotension: No Has patient had a PCN reaction causing severe rash involving mucus membranes or skin necrosis: No Has patient had a PCN reaction that required hospitalization No Has patient had a PCN reaction occurring within the last 10 years: No If all of the above answers are "NO", then may proceed with Cephalosporin use.     Family History  Problem Relation Age of Onset  . Cancer - Colon Father   . Stroke Brother   .  Cancer Sister        spine?     Prior to Admission medications   Medication Sig Start Date End Date Taking? Authorizing Provider  ALPRAZolam Prudy Feeler) 1 MG tablet Take 0.5 mg by mouth 3 (three) times daily as needed. Anxiety 10/08/15   [provider]  bisoprolol (ZEBETA) 5 MG tablet Take 0.5 tablets (2.5 mg total) by mouth daily. 07/24/16   Hilty, Lisette Abu, MD  buPROPion (WELLBUTRIN) 100 MG tablet Take 100 mg by mouth every morning. 10/06/15   [provider]  leflunomide (ARAVA) 10 MG tablet Take 10 mg by mouth daily. 07/20/15    [provider]  mirtazapine (REMERON) 30 MG tablet Take 30 mg by mouth at bedtime.     [provider]  nortriptyline (PAMELOR) 75 MG capsule Take 75 mg by mouth at bedtime.    [provider]  predniSONE (DELTASONE) 5 MG tablet Take 1.5 tablets (7.5 mg total) by mouth daily with breakfast. Resume in 4days 10/27/15   Zannie Cove, MD  TRAVATAN Z 0.004 % SOLN ophthalmic solution Place 1 drop into both eyes at bedtime. 09/16/15   [provider]  vitamin B-12 (CYANOCOBALAMIN) 1000 MCG tablet Take 1 tablet (1,000 mcg total) by mouth daily. 03/22/15   Osvaldo Shipper, MD    Physical Exam: Vitals:   10/19/17 0215 10/19/17 0330 10/19/17 0350 10/19/17 0400  BP: 126/60  129/65 (!) 127/59  Pulse: 92  87 85  Resp: 19  (!) 21 19  Temp:      TempSrc:      SpO2: 99%  100% 100%  Weight:      Height:  5\' 3"  (1.6 m)      Constitutional: NAD, calm, comfortable Eyes: PERRL, lids and conjunctivae normal ENMT: Mucous membranes are moist. Posterior pharynx clear of any exudate or lesions.Normal dentition.  Neck: normal, supple, no masses, no thyromegaly Respiratory: clear to auscultation bilaterally, no wheezing, no crackles. Normal respiratory effort. No accessory muscle use.  Cardiovascular: Regular rate and rhythm, no murmurs / rubs / gallops. No extremity edema. 2+ pedal pulses. No carotid bruits.  Abdomen: no tenderness, no masses palpated. No hepatosplenomegaly. Bowel sounds positive.  Musculoskeletal: no clubbing / cyanosis. No joint deformity upper and lower extremities. Good ROM, no contractures. Normal muscle tone.  Skin: no rashes, lesions, ulcers. No induration Neurologic: CN 2-12 grossly intact. Sensation intact, DTR normal. Strength 5/5 in all 4.  Psychiatric: Mild confusion, cant remember home meds.   Labs on Admission: I have personally reviewed following labs and imaging studies  CBC: Recent Labs  Lab 10/19/17 0130  WBC 17.7*  NEUTROABS 15.0*    HGB 12.1  HCT 36.2  MCV 97.3  PLT 134*   Basic Metabolic Panel: Recent Labs  Lab 10/19/17 0130  NA 136  K 3.4*  CL 100*  CO2 23  GLUCOSE 140*  BUN 21*  CREATININE 0.87  CALCIUM 8.5*   GFR: Estimated Creatinine Clearance: 42.7 mL/min (by C-G formula based on SCr of 0.87 mg/dL). Liver Function Tests: Recent Labs  Lab 10/19/17 0130  AST 45*  ALT 18  ALKPHOS 76  BILITOT 1.1  PROT 6.7  ALBUMIN 3.4*   No results for input(s): LIPASE, AMYLASE in the last 168 hours. No results for input(s): AMMONIA in the last 168 hours. Coagulation Profile: Recent Labs  Lab 10/19/17 0130  INR 1.15   Cardiac Enzymes: Recent Labs  Lab 10/19/17 0158  TROPONINI 1.54*   BNP (last 3 results) No  results for input(s): PROBNP in the last 8760 hours. HbA1C: No results for input(s): HGBA1C in the last 72 hours. CBG: No results for input(s): GLUCAP in the last 168 hours. Lipid Profile: No results for input(s): CHOL, HDL, LDLCALC, TRIG, CHOLHDL, LDLDIRECT in the last 72 hours. Thyroid Function Tests: No results for input(s): TSH, T4TOTAL, FREET4, T3FREE, THYROIDAB in the last 72 hours. Anemia Panel: No results for input(s): VITAMINB12, FOLATE, FERRITIN, TIBC, IRON, RETICCTPCT in the last 72 hours. Urine analysis:    Component Value Date/Time   COLORURINE AMBER (A) 10/19/2017 0054   APPEARANCEUR CLOUDY (A) 10/19/2017 0054   LABSPEC 1.018 10/19/2017 0054   PHURINE 6.0 10/19/2017 0054   GLUCOSEU NEGATIVE 10/19/2017 0054   HGBUR MODERATE (A) 10/19/2017 0054   BILIRUBINUR NEGATIVE 10/19/2017 0054   KETONESUR 20 (A) 10/19/2017 0054   PROTEINUR 100 (A) 10/19/2017 0054   UROBILINOGEN 0.2 03/21/2015 0236   NITRITE POSITIVE (A) 10/19/2017 0054   LEUKOCYTESUR LARGE (A) 10/19/2017 0054    Radiological Exams on Admission: Dg Chest 2 View  Result Date: 10/19/2017 CLINICAL DATA:  Acute onset of altered mental status. Vomiting and diarrhea. Urinary incontinence. Fever. EXAM: CHEST  2 VIEW  COMPARISON:  Chest radiograph performed 10/03/2015 FINDINGS: The lungs are well-aerated and clear. There is no evidence of focal opacification, pleural effusion or pneumothorax. The cardiomediastinal silhouette is borderline normal in size. A vascular stent is noted overlying the aortic valve. No acute osseous abnormalities are seen. There is chronic superior subluxation of the left humeral head. IMPRESSION: No acute cardiopulmonary process seen. Electronically Signed   By: Roanna Raider M.D.   On: 10/19/2017 01:33    EKG: Independently reviewed.  Assessment/Plan Principal Problem:   Sepsis secondary to UTI [facility]) Active Problems:   Rheumatoid arthritis involving multiple joints (HCC)   DM2 (diabetes mellitus, type 2) (HCC)   Chronic adrenal insufficiency (HCC)   Memory deficit   S/P TAVR (transcatheter aortic valve replacement)   Sepsis associated hypotension (HCC)   Elevated troponin    1. Sepsis secondary to UTI - 1. IVF: 30 cc/kg bolus done 2. Will switch ABx to merrem given h/o ESBL numerous times in past 3. Cultures pending 2. Chronic adrenal insufficiency - 1. Would rather try and triple up on home prednisone dose at this time given resolution of hypotension with just IVF, no tachycardia, etc.  Rather than going full stress dose steroids just yet, especially given cardiac history. 2. In past it looks like she has been on prednisone 7.5mg  daily 3. Will put her on prednisone 25 mg PO daily first dose now, at this time. 4. If patient status declines, then go to full stress dose solucortef 3. Elevated troponin - 1. Will trend 2. However, patient has no CP, no SOB 3. The lateral EKG ST depressions are very old, going back at least to 2014 or older and seem grossly unchanged. 4. Additionally LHC by [name] in 2014 demonstrated "no significant obstructive coronary disease" 5. Thus I suspect this is most likely just demand ischemia. 1. Will give cards fellow a quick page to confirm  though. 4. AVR s/p TAVR - 1. Chronic and stable 5. DM2 - 1. Looks to be diet controlled 2. Due to increased steroids will put patient on sensitive SSI AC 6. RA - 1. Prednisone tripled from known dose as above (unable to get a-hold of daughter to actually find out med doses thus far). 2. May wish to hold Arava for next couple of days due  to infection.   DVT prophylaxis: Lovenox Code Status: Full code - Per son whom I spoke with on phone Family Communication: Spoke with Son on Phone, left voice mail with daughter to call us back. Disposition Plan: Home after admit Consults called: None Admission status: Admit to inpatient   Hillary Bow DO [facility] Pager [phone]  If 7AM-7PM, please contact day team taking care of patient [email or web address] Password [email or web address]  10/19/2017, 4:09 AM

## 2017-10-19 NOTE — ED Provider Notes (Signed)
[facility]-EMERGENCY DEPT Provider Note   CSN: [id number] Arrival date & time: 10/19/17  0029     History   Chief Complaint Chief Complaint  Patient presents with  . Altered Mental Status    HPI [name] is a 82 y.o. female.  HPI Patient is an 82 year old female who began having some confusion yesterday.  Family brought her to the emergency department today as they are concerned about the possibility of a UTI.  She has had vomiting and diarrhea as well as some incontinence.  Reported fever at home.  Tylenol was given at that time.  Presents today feeling that she overall does not feel well.  She denies chest pain or shortness of breath.  Denies cough.  Denies abdominal pain.  States she feels generally weak.  Found to be hypotensive on arrival to the ER with a blood pressure of 84/45.  Denies headache.  Denies unilateral leg or arm symptoms are moderate in severity.   Past Medical History:  Diagnosis Date  . Adrenal insufficiency, primary, iatrogenic   . Anxiety   . Aortic stenosis, severe 02/26/2013  . Chest pain    myoview 04/13/04-inferoapical thinning with a suggestion of mild apical ischemia, EF 66%  . Depression 03/25/2013  . Gait instability   . Hearing loss 03/25/2013  . Memory deficit 03/25/2013  . Murmur, cardiac   . Osteoporosis   . Palpitations   . Pancreatic insufficiency 11/09/2012  . Physical deconditioning   . Pneumonia   . Pulmonary fibrosis (HCC) 03/25/2013  . Rheumatoid arthritis(714.0)   . SOBOE (shortness of breath on exertion)    echo 02/23/13-EF 55-60%, possible bicuspid aortic valve with mod to severed stenosis   . Syncope   . UTI (lower urinary tract infection) 10/01/2011    Patient Active Problem List   Diagnosis Date Noted  . Dizziness 07/24/2016  . Dyspnea 12/06/2015  . Hypoxia 12/06/2015  . Hypokalemia 10/21/2015  . Hypophosphatemia 10/21/2015  . Hypomagnesemia 10/21/2015  . CAP (community acquired pneumonia)  10/20/2015  . B12 deficiency   . Malnutrition of moderate degree (HCC) 03/18/2015  . Altered mental state 03/17/2015  . Metabolic encephalopathy 09/12/2014    Class: Acute  . Sepsis secondary to UTI (HCC) 09/09/2014  . S/P TAVR (transcatheter aortic valve replacement) 03/16/2014  . Left rib fracture 11/17/2013  . Closed pelvic fracture (HCC) 11/17/2013  . Sepsis (HCC) 11/10/2013  . UTI (lower urinary tract infection) 11/09/2013  . Sepsis syndrome 09/17/2013  . COPD (chronic obstructive pulmonary disease) (HCC) 07/20/2013  . Chest pain 06/29/2013  . Hearing loss 03/25/2013  . Memory deficit 03/25/2013  . Depression 03/25/2013  . Syncope 03/25/2013  . Pulmonary fibrosis (HCC)   . Aortic stenosis, severe 02/26/2013  . Pancreatic insufficiency (HCC) 11/09/2012    Class: Chronic  . Low back pain 01/16/2012    Class: Acute  . Gait instability 01/16/2012    Class: Acute  . Sinusitis, acute, sphenoidal 11/17/2011    Class: Acute  . Pneumonia 11/12/2011    Class: Acute  . Adrenal insufficiency, primary, iatrogenic 11/12/2011    Class: Chronic  . E. coli UTI (urinary tract infection) 10/01/2011  . Fever 10/01/2011  . Cough 10/01/2011  . Failure to thrive in adult 10/01/2011  . Weakness generalized 10/01/2011  . Rheumatoid arthritis involving multiple joints (HCC) 10/01/2011  . DM2 (diabetes mellitus, type 2) (HCC) 10/01/2011    Past Surgical History:  Procedure Laterality Date  . ABDOMINAL HYSTERECTOMY    .  CARDIAC CATHETERIZATION  11/08/96   no evidence of CAD, nl LV function  . CHOLECYSTECTOMY    . CORONARY ANGIOGRAM  03/22/2013   Procedure: CORONARY ANGIOGRAM;  Surgeon: [name], MD;  Location: [facility] CATH LAB;  Service: Cardiovascular;;  . KNEE ARTHROSCOPY     2 on left  . RIGHT HEART CATHETERIZATION  03/22/2013   Procedure: RIGHT HEART CATH;  Surgeon: [name], MD;  Location: [facility] CATH LAB;  Service: Cardiovascular;;  . SHOULDER SURGERY Left   . THYROIDECTOMY       OB History    No data available       Home Medications    Prior to Admission medications   Medication Sig Start Date End Date Taking? Authorizing Provider  ALPRAZolam Prudy Feeler) 1 MG tablet Take 0.5 mg by mouth 3 (three) times daily as needed. Anxiety 10/08/15   [provider]  bisoprolol (ZEBETA) 5 MG tablet Take 0.5 tablets (2.5 mg total) by mouth daily. 07/24/16   Hilty, Lisette Abu, MD  buPROPion (WELLBUTRIN) 100 MG tablet Take 100 mg by mouth every morning. 10/06/15   [provider]  leflunomide (ARAVA) 10 MG tablet Take 10 mg by mouth daily. 07/20/15   [provider]  mirtazapine (REMERON) 30 MG tablet Take 30 mg by mouth at bedtime.     [provider]  nortriptyline (PAMELOR) 75 MG capsule Take 75 mg by mouth at bedtime.    [provider]  predniSONE (DELTASONE) 5 MG tablet Take 1.5 tablets (7.5 mg total) by mouth daily with breakfast. Resume in 4days 10/27/15   Zannie Cove, MD  TRAVATAN Z 0.004 % SOLN ophthalmic solution Place 1 drop into both eyes at bedtime. 09/16/15   [provider]  vitamin B-12 (CYANOCOBALAMIN) 1000 MCG tablet Take 1 tablet (1,000 mcg total) by mouth daily. 03/22/15   Osvaldo Shipper, MD    Family History Family History  Problem Relation Age of Onset  . Cancer - Colon Father   . Stroke Brother   . Cancer Sister        spine?    Social History Social History   Tobacco Use  . Smoking status: Never Smoker  . Smokeless tobacco: Never Used  Substance Use Topics  . Alcohol use: No  . Drug use: No     Allergies   Adhesive [tape] and Penicillins   Review of Systems Review of Systems  All other systems reviewed and are negative.    Physical Exam Updated Vital Signs BP 126/60   Pulse 92   Temp 99.2 F (37.3 C) (Rectal)   Resp 19   Wt 58.5 kg (129 lb)   SpO2 99%   BMI 22.85 kg/m   Physical Exam  Constitutional: She is oriented to person, place, and time. She appears  well-developed and well-nourished. No distress.  HENT:  Head: Normocephalic and atraumatic.  Eyes: EOM are normal.  Neck: Normal range of motion.  Cardiovascular: Normal rate, regular rhythm and normal heart sounds.  Pulmonary/Chest: Effort normal and breath sounds normal.  Abdominal: Soft. She exhibits no distension. There is no tenderness.  Musculoskeletal: Normal range of motion.  Neurological: She is alert and oriented to person, place, and time.  Skin: Skin is warm and dry.  Psychiatric: She has a normal mood and affect. Judgment normal.  Nursing note and vitals reviewed.    ED Treatments / Results  Labs (all labs ordered are listed, but only abnormal results are displayed) Labs Reviewed  COMPREHENSIVE  METABOLIC PANEL - Abnormal; Notable for the following components:      Result Value   Potassium 3.4 (*)    Chloride 100 (*)    Glucose, Bld 140 (*)    BUN 21 (*)    Calcium 8.5 (*)    Albumin 3.4 (*)    AST 45 (*)    All other components within normal limits  CBC WITH DIFFERENTIAL/PLATELET - Abnormal; Notable for the following components:   WBC 17.7 (*)    RBC 3.72 (*)    Platelets 134 (*)    Neutro Abs 15.0 (*)    Monocytes Absolute 1.3 (*)    All other components within normal limits  URINALYSIS, ROUTINE W REFLEX MICROSCOPIC - Abnormal; Notable for the following components:   Color, Urine AMBER (*)    APPearance CLOUDY (*)    Hgb urine dipstick MODERATE (*)    Ketones, ur 20 (*)    Protein, ur 100 (*)    Nitrite POSITIVE (*)    Leukocytes, UA LARGE (*)    Bacteria, UA MANY (*)    Squamous Epithelial / LPF 0-5 (*)    Non Squamous Epithelial 0-5 (*)    All other components within normal limits  I-STAT CG4 LACTIC ACID, ED - Abnormal; Notable for the following components:   Lactic Acid, Venous 2.93 (*)    All other components within normal limits  CULTURE, BLOOD (ROUTINE X 2)  CULTURE, BLOOD (ROUTINE X 2)  PROTIME-INR  TROPONIN I  I-STAT CG4 LACTIC ACID, ED     EKG  EKG Interpretation  Date/Time:  Sunday October 19 2017 01:18:46 EST Ventricular Rate:  91 PR Interval:    QRS Duration: 113 QT Interval:  372 QTC Calculation: 458 R Axis:   27 Text Interpretation:  Sinus rhythm Right atrial enlargement LVH with IVCD and secondary repol abnrm nonspecific ST abnormality unchanged from prior ecg  Confirmed by Azalia Bilis ([id number]) on 10/19/2017 1:57:46 AM       Radiology Dg Chest 2 View  Result Date: 10/19/2017 CLINICAL DATA:  Acute onset of altered mental status. Vomiting and diarrhea. Urinary incontinence. Fever. EXAM: CHEST  2 VIEW COMPARISON:  Chest radiograph performed 10/03/2015 FINDINGS: The lungs are well-aerated and clear. There is no evidence of focal opacification, pleural effusion or pneumothorax. The cardiomediastinal silhouette is borderline normal in size. A vascular stent is noted overlying the aortic valve. No acute osseous abnormalities are seen. There is chronic superior subluxation of the left humeral head. IMPRESSION: No acute cardiopulmonary process seen. Electronically Signed   By: Roanna Raider M.D.   On: 10/19/2017 01:33    Procedures .Critical Care Performed by: Azalia Bilis, MD Authorized by: Azalia Bilis, MD    Total critical care time: 31 minutes Critical care time was exclusive of separately billable procedures and treating other patients. Critical care was necessary to treat or prevent imminent or life-threatening deterioration. Critical care was time spent personally by me on the following activities: development of treatment plan with patient and/or surrogate as well as nursing, discussions with consultants, evaluation of patient's response to treatment, examination of patient, obtaining history from patient or surrogate, ordering and performing treatments and interventions, ordering and review of laboratory studies, ordering and review of radiographic studies, pulse oximetry and re-evaluation of patient's  condition.   Medications Ordered in ED Medications  sodium chloride 0.9 % bolus 1,755 mL (1,000 mLs Intravenous New Bag/Given 10/19/17 0138)  vancomycin (VANCOCIN) IVPB 1000 mg/200 mL premix (1,000 mg Intravenous New  Bag/Given 10/19/17 0208)  ceFEPIme (MAXIPIME) 1 g in dextrose 5 % 50 mL IVPB (0 g Intravenous Stopped 10/19/17 0315)     Initial Impression / Assessment and Plan / ED Course  I have reviewed the triage vital signs and the nursing notes.  Pertinent labs & imaging results that were available during my care of the patient were reviewed by me and considered in my medical decision making (see chart for details).     Likely urosepsis in the emergency department.  30 cc/kg bolus given.  Broad-spectrum antibiotics.  Urine culture sent.  Blood cultures pending.  Chest x-ray shows no evidence of pneumonia.  Abdominal exam is benign.  Nonfocal neurologic exam.  Patient will be admitted the hospital for ongoing management of her sepsis and UTI.  Lactate 2.93.  Final Clinical Impressions(s) / ED Diagnoses   Final diagnoses:  Sepsis, due to unspecified organism [facility])  Acute cystitis with hematuria  Acute confusion  Other specified hypotension    ED Discharge Orders    None       Azalia Bilis, MD 10/19/17 [phone]

## 2017-10-19 NOTE — ED Notes (Signed)
Date and time results received: 10/19/17 0335 Test:Troponin Critical Value:1.54 Name of Provider Notified: Dr. Patria Mane  Orders Received? Or Actions Taken?:

## 2017-10-19 NOTE — ED Triage Notes (Signed)
Family states the pt has altered mental status that started yesterday  States when she gets a UTI she tends to get this way  States pt has had vomiting and diarrhea and some incontinence of urine that is not normal for her  Pt has had fever at home  Was given tylenol at home this earlier tonight  Pt is pale  Skin warm and dry  B/P in triage 84/45

## 2017-10-20 ENCOUNTER — Inpatient Hospital Stay (HOSPITAL_COMMUNITY): Payer: Medicare Other

## 2017-10-20 LAB — GLUCOSE, CAPILLARY
GLUCOSE-CAPILLARY: 72 mg/dL (ref 65–99)
GLUCOSE-CAPILLARY: 122 mg/dL — AB (ref 65–99)
GLUCOSE-CAPILLARY: 129 mg/dL — AB (ref 65–99)
Glucose-Capillary: 118 mg/dL — ABNORMAL HIGH (ref 65–99)

## 2017-10-20 LAB — BASIC METABOLIC PANEL
ANION GAP: 8 (ref 5–15)
BUN: 14 mg/dL (ref 6–20)
CALCIUM: 7.4 mg/dL — AB (ref 8.9–10.3)
CO2: 21 mmol/L — ABNORMAL LOW (ref 22–32)
Chloride: 110 mmol/L (ref 101–111)
Creatinine, Ser: 0.57 mg/dL (ref 0.44–1.00)
GLUCOSE: 83 mg/dL (ref 65–99)
POTASSIUM: 3.2 mmol/L — AB (ref 3.5–5.1)
Sodium: 139 mmol/L (ref 135–145)

## 2017-10-20 LAB — CBC
HEMATOCRIT: 29.9 % — AB (ref 36.0–46.0)
HEMOGLOBIN: 9.9 g/dL — AB (ref 12.0–15.0)
MCH: 32 pg (ref 26.0–34.0)
MCHC: 33.1 g/dL (ref 30.0–36.0)
MCV: 96.8 fL (ref 78.0–100.0)
Platelets: 103 10*3/uL — ABNORMAL LOW (ref 150–400)
RBC: 3.09 MIL/uL — AB (ref 3.87–5.11)
RDW: 14.3 % (ref 11.5–15.5)
WBC: 14.1 10*3/uL — AB (ref 4.0–10.5)

## 2017-10-20 MED ORDER — PREDNISONE 5 MG PO TABS
15.0000 mg | ORAL_TABLET | Freq: Every day | ORAL | Status: DC
  Administered 2017-10-20: 15 mg via ORAL
  Filled 2017-10-20: qty 1

## 2017-10-20 MED ORDER — MIRTAZAPINE 15 MG PO TABS
30.0000 mg | ORAL_TABLET | Freq: Every day | ORAL | Status: DC
  Administered 2017-10-20 – 2017-10-21 (×2): 30 mg via ORAL
  Filled 2017-10-20 (×2): qty 2

## 2017-10-20 MED ORDER — BUPROPION HCL 100 MG PO TABS
100.0000 mg | ORAL_TABLET | Freq: Every morning | ORAL | Status: DC
  Administered 2017-10-20 – 2017-10-22 (×3): 100 mg via ORAL
  Filled 2017-10-20 (×3): qty 1

## 2017-10-20 MED ORDER — VITAMIN B-12 1000 MCG PO TABS
1000.0000 ug | ORAL_TABLET | Freq: Every day | ORAL | Status: DC
  Administered 2017-10-20 – 2017-10-22 (×3): 1000 ug via ORAL
  Filled 2017-10-20 (×3): qty 1

## 2017-10-20 MED ORDER — NORTRIPTYLINE HCL 25 MG PO CAPS
75.0000 mg | ORAL_CAPSULE | Freq: Every day | ORAL | Status: DC
  Administered 2017-10-20 – 2017-10-21 (×2): 75 mg via ORAL
  Filled 2017-10-20 (×2): qty 3

## 2017-10-20 MED ORDER — BISOPROLOL FUMARATE 5 MG PO TABS
2.5000 mg | ORAL_TABLET | Freq: Every day | ORAL | Status: DC
  Administered 2017-10-20 – 2017-10-22 (×3): 2.5 mg via ORAL
  Filled 2017-10-20 (×3): qty 1

## 2017-10-20 MED ORDER — LATANOPROST 0.005 % OP SOLN
1.0000 [drp] | Freq: Every day | OPHTHALMIC | Status: DC
  Administered 2017-10-20 – 2017-10-21 (×2): 1 [drp] via OPHTHALMIC
  Filled 2017-10-20: qty 2.5

## 2017-10-20 MED ORDER — NORTRIPTYLINE HCL 25 MG PO CAPS: 75.0000 mg | ORAL_CAPSULE | Freq: Every day | ORAL | Status: DC

## 2017-10-20 MED ORDER — ALPRAZOLAM 0.5 MG PO TABS
0.5000 mg | ORAL_TABLET | Freq: Three times a day (TID) | ORAL | Status: DC | PRN
  Administered 2017-10-21 (×2): 0.5 mg via ORAL
  Filled 2017-10-20 (×2): qty 1

## 2017-10-20 MED ORDER — LEFLUNOMIDE 10 MG PO TABS: 10.0000 mg | ORAL_TABLET | Freq: Every day | ORAL | Status: DC

## 2017-10-20 MED ORDER — LEFLUNOMIDE 20 MG PO TABS
10.0000 mg | ORAL_TABLET | Freq: Every day | ORAL | Status: DC
  Administered 2017-10-20 – 2017-10-22 (×3): 10 mg via ORAL
  Filled 2017-10-20 (×5): qty 1

## 2017-10-20 NOTE — Progress Notes (Signed)
Initial Nutrition Assessment  DOCUMENTATION CODES:   Not applicable  INTERVENTION:   Ensure Enlive po BID, each supplement provides 350 kcal and 20 grams of protein  NUTRITION DIAGNOSIS:   Inadequate oral intake related to poor appetite as evidenced by estimated needs.  GOAL:   Patient will meet greater than or equal to 90% of their needs  MONITOR:   PO intake, Supplement acceptance, Weight trends, Labs  REASON FOR ASSESSMENT:   Malnutrition Screening Tool    ASSESSMENT:   Pt with PMH significant for severe AS s/p TAVR, adrenal insufficiency, pancreatic insufficiency, memory deficit, pulmonary fibrosis, and aortic stenosis. Presents this admission with sepsis secondary to UTI.    Spoke with pt at bedside. Pt seemed confused at times. States she had loss in appetite for the last couple of days. When asked for dietary recall pt began talking about hotdogs. Unable to provide daily intake examples. Pt consumed 20% of her lunch yesterday. No other meal completions charted. Pt reports a UBW of 125 lb. Records are limited in weight history but show pt has weighed 120-125 for the last two years. Will provide pt with supplementation this stay and encourage PO intake.   Medications reviewed and include: SSI, remeron, prednisone, Vit B12, IV abx Labs reviewed: K 3.2 (L)  NUTRITION - FOCUSED PHYSICAL EXAM:    Most Recent Value  Orbital Region  No depletion  Upper Arm Region  Mild depletion  Thoracic and Lumbar Region  Unable to assess  Buccal Region  No depletion  Temple Region  No depletion  Clavicle Bone Region  Mild depletion  Clavicle and Acromion Bone Region  Mild depletion  Scapular Bone Region  Unable to assess  Dorsal Hand  Unable to assess  Patellar Region  Mild depletion  Anterior Thigh Region  Mild depletion  Posterior Calf Region  Mild depletion  Edema (RD Assessment)  None  Hair  Reviewed  Eyes  Reviewed  Mouth  Reviewed  Skin  Reviewed  Nails  Reviewed       Diet Order:  Diet Carb Modified Fluid consistency: Thin; Room service appropriate? Yes  EDUCATION NEEDS:   Not appropriate for education at this time  Skin:  Skin Assessment: Reviewed RN Assessment  Last BM:  10/18/17  Height:   Ht Readings from Last 1 Encounters:  10/19/17 5\' 3"  (1.6 m)    Weight:   Wt Readings from Last 1 Encounters:  10/19/17 120 lb 5.9 oz (54.6 kg)    Ideal Body Weight:  52.3 kg  BMI:  Body mass index is 21.32 kg/m.  Estimated Nutritional Needs:   Kcal:  1400-1600 kcal/day  Protein:  60-70 g/day  Fluid:  >1.4 L/day    10/21/17 RD, LDN Clinical Nutrition Pager # - [phone]

## 2017-10-20 NOTE — Evaluation (Signed)
Occupational Therapy Evaluation Patient Details Name: [name] MRN: [id number] DOB: [birth year] Today's Date: 10/20/2017    History of Present Illness 82 yo female admitted with UTI, sespsis, AMS. Hx of aortic stenosis, RA, osteoporosis, gait instability, syncope, fall, memory deficits.    Clinical Impression   Pt admitted with above. She demonstrates the below listed deficits and will benefit from continued OT to maximize safety and independence with BADLs.  Pt presents to OT with generalized weakness, decreased activity tolerance, decreased balance, and impaired cognition.  She fatigues quite rapidly with ADL activities and requires mod - total A, overall for ADLs, and min A for stand pivot transfers only.  Resting HR 126, working HR 137 with DOE 3-4/4.   She demonstrates poor problem solving and poor safety awareness.  She lives alone, and was independent PTA.  She will require 24 hour physical assist at discharge - anticipate she will require SNF level rehab.       Follow Up Recommendations  SNF;Supervision/Assistance - 24 hour    Equipment Recommendations  None recommended by OT    Recommendations for Other Services       Precautions / Restrictions Precautions Precautions: Fall      Mobility Bed Mobility               General bed mobility comments: sitting in recliner   Transfers Overall transfer level: Needs assistance Equipment used: 1 person hand held assist Transfers: Sit to/from Stand;Stand Pivot Transfers Sit to Stand: Min assist Stand pivot transfers: Min assist       General transfer comment: assist to rise and assist to balance     Balance Overall balance assessment: Needs assistance;History of Falls Sitting-balance support: Feet supported Sitting balance-Leahy Scale: Fair     Standing balance support: Single extremity supported;During functional activity Standing balance-Leahy Scale: Poor                             ADL  either performed or assessed with clinical judgement   ADL Overall ADL's : Needs assistance/impaired Eating/Feeding: Set up;Sitting   Grooming: Wash/dry hands;Wash/dry face;Oral care;Brushing hair;Set up;Sitting;Supervision/safety   Upper Body Bathing: Minimal assistance;Sitting   Lower Body Bathing: Maximal assistance;Sit to/from stand Lower Body Bathing Details (indicate cue type and reason): Pt fatigued quickly.  very distracted  Upper Body Dressing : Moderate assistance;Sitting   Lower Body Dressing: Total assistance;Sit to/from stand   Toilet Transfer: Minimal assistance;Stand-pivot;BSC   Toileting- Clothing Manipulation and Hygiene: Total assistance;Sit to/from stand Toileting - Clothing Manipulation Details (indicate cue type and reason): Pt self distracts.  She was incontinent of urine.  Prior to standing, pt stated, "hold on, I need to pee", and proceeded to urinate in the recliner rather than requesting to use BSC.   Pt had small BM on BSC.  While she was standing for peri care, with hands on BSC, she began to urinate and stated "I'm peeing", making no attempt to sit back down on commode      Functional mobility during ADLs: Minimal assistance       Vision         Perception     Praxis      Pertinent Vitals/Pain Pain Assessment: Faces Faces Pain Scale: Hurts little more Pain Location: generalized  Pain Descriptors / Indicators: Grimacing Pain Intervention(s): Monitored during session;Repositioned     Hand Dominance Right   Extremity/Trunk Assessment Upper Extremity Assessment Upper Extremity Assessment: Generalized weakness  Lower Extremity Assessment Lower Extremity Assessment: Defer to PT evaluation   Cervical / Trunk Assessment Cervical / Trunk Assessment: Kyphotic   Communication Communication Communication: Expressive difficulties(difficult to understand.  Speech sometimes doesn't make sens)   Cognition Arousal/Alertness: Awake/alert Behavior  During Therapy: Restless;WFL for tasks assessed/performed Overall Cognitive Status: No family/caregiver present to determine baseline cognitive functioning                                 General Comments: Pt tangential - difficult to follow conversation.  She demonstrates poor problem solving and decreased safety awareness.     General Comments  RHR 126; WHR 137.   DOE 3-4/4. 02 sats >94% on RA     Exercises     Shoulder Instructions      Home Living Family/patient expects to be discharged to:: Skilled nursing facility Living Arrangements: Alone Available Help at Discharge: Family Type of Home: House Home Access: Stairs to enter [employer] of Steps: 2 Entrance Stairs-Rails: Right;Left Home Layout: One level               Home Equipment: Environmental consultant - 2 wheels;Walker - 4 wheels;Shower seat;Bedside commode          Prior Functioning/Environment Level of Independence: Independent with assistive device(s)        Comments: Uses RW.  Pt reports she doesn't drive.  Daughter assists with groceries         OT Problem List: Decreased strength;Decreased activity tolerance;Impaired balance (sitting and/or standing);Decreased cognition;Decreased safety awareness;Decreased knowledge of use of DME or AE;Cardiopulmonary status limiting activity      OT Treatment/Interventions: Self-care/ADL training;Therapeutic exercise;Therapeutic activities;Cognitive remediation/compensation;Patient/family education;Balance training;DME and/or AE instruction    OT Goals(Current goals can be found in the care plan section) Acute Rehab OT Goals Patient Stated Goal: did not state  OT Goal Formulation: With patient Time For Goal Achievement: 11/03/17 Potential to Achieve Goals: Good ADL Goals Pt Will Perform Grooming: with min assist;standing Pt Will Perform Upper Body Bathing: with set-up;sitting Pt Will Perform Lower Body Bathing: with min assist;sit to/from stand Pt  Will Perform Upper Body Dressing: with set-up;with supervision;sitting Pt Will Perform Lower Body Dressing: with min assist;sit to/from stand Pt Will Perform Toileting - Clothing Manipulation and hygiene: with min assist;sit to/from stand  OT Frequency: Min 2X/week   Barriers to D/C: Decreased caregiver support          Co-evaluation              AM-PAC PT "6 Clicks" Daily Activity     Outcome Measure Help from another person eating meals?: A Little Help from another person taking care of personal grooming?: A Little Help from another person toileting, which includes using toliet, bedpan, or urinal?: A Lot Help from another person bathing (including washing, rinsing, drying)?: A Lot Help from another person to put on and taking off regular upper body clothing?: A Lot Help from another person to put on and taking off regular lower body clothing?: Total 6 Click Score: 13   End of Session Nurse Communication: Mobility status  Activity Tolerance: Patient limited by fatigue Patient left: in chair;with call bell/phone within reach  OT Visit Diagnosis: Unsteadiness on feet (R26.81);Cognitive communication deficit (R41.841)                Time: 9417-4081 OT Time Calculation (min): 36 min Charges:  OT General Charges $OT Visit: 1 Visit OT Evaluation $OT  Eval Moderate Complexity: 1 Mod OT Treatments $Self Care/Home Management : 8-22 mins G-Codes:     [employer], OTR/L [phone]   Jeani Hawking M 10/20/2017, 5:21 PM

## 2017-10-20 NOTE — Care Management Note (Signed)
Case Management Note  Patient Details  Name: SHAMARRA WARDA MRN: [id number] Date of Birth: July 09, 1935  Subjective/Objective:                  ams urosepsis by the labs  Action/Plan: Date: October 20, 2017 Marcelle Smiling, BSN, [place], [place] [phone] Chart and notes review for patient progress and needs. Will follow for case management and discharge needs. Next review date: [id number]  Expected Discharge Date:                  Expected Discharge Plan:  Home/Self Care  In-House Referral:     Discharge planning Services  CM Consult  Post Acute Care Choice:    Choice offered to:     DME Arranged:    DME Agency:     HH Arranged:    HH Agency:     Status of Service:  In process, will continue to follow  If discussed at Long Length of Stay Meetings, dates discussed:    Additional Comments:  Golda Acre, RN 10/20/2017, 7:58 AM

## 2017-10-20 NOTE — Evaluation (Signed)
Physical Therapy Evaluation Patient Details Name: [name] MRN: [id number] DOB: November 18, 1934 Today's Date: 10/20/2017   History of Present Illness  82 yo female admitted with UTI, sespsis, AMS. Hx of aortic stenosis, RA, osteoporosis, gait instability, syncope, fall, memory deficits.     Clinical Impression  On eval, pt required Min assist for mobility. She was able to perform a stand pivot from bed to recliner using a RW. Assist needed for changing gown and cleaning pt since pt and linens were soaked in urine. Will hopefully be able to assess gait on next session. Unsure of d/c plan at this time. Pt lives alone. If pt progresses well and will have help from family, pt may be able to return home. Will follow.     Follow Up Recommendations SNF vs Home health PT;Supervision/Assistance - 24 hour(depending on progress and family assist available)    Equipment Recommendations  None recommended by PT    Recommendations for Other Services       Precautions / Restrictions Precautions Precautions: Fall Restrictions Weight Bearing Restrictions: No      Mobility  Bed Mobility Overal bed mobility: Needs Assistance Bed Mobility: Supine to Sit     Supine to sit: Min assist;HOB elevated     General bed mobility comments: Assist for trunk and to scoot to EOB . Increased time.   Transfers Overall transfer level: Needs assistance Equipment used: Rolling walker (2 wheeled) Transfers: Sit to/from [employer] Sit to Stand: Min assist Stand pivot transfers: Min assist       General transfer comment: Assist to rise, stabilize, control descent. VCs safety, technique, hand placement. Stand pivot, bed to recliner, with RW.   Ambulation/Gait                Stairs            Wheelchair Mobility    Modified Rankin (Stroke Patients Only)       Balance Overall balance assessment: Needs assistance;History of Falls           Standing balance-Leahy  Scale: Poor                               Pertinent Vitals/Pain Pain Assessment: Faces Faces Pain Scale: Hurts little more Pain Location: L side of abdomen Pain Descriptors / Indicators: Sore;Aching Pain Intervention(s): Monitored during session;Repositioned    Home Living Family/patient expects to be discharged to:: Unsure Living Arrangements: Alone Available Help at Discharge: Family Type of Home: House Home Access: Stairs to enter Entrance Stairs-Rails: Doctor, general practice of Steps: 2 Home Layout: One level Home Equipment: Environmental consultant - 2 wheels;Walker - 4 wheels      Prior Function Level of Independence: Independent with assistive device(s)         Comments: uses RW     Hand Dominance        Extremity/Trunk Assessment   Upper Extremity Assessment Upper Extremity Assessment: Generalized weakness    Lower Extremity Assessment Lower Extremity Assessment: Generalized weakness    Cervical / Trunk Assessment Cervical / Trunk Assessment: Kyphotic  Communication   Communication: No difficulties  Cognition Arousal/Alertness: Awake/alert Behavior During Therapy: WFL for tasks assessed/performed Overall Cognitive Status: Within Functional Limits for tasks assessed                                 General Comments: some memory  issues at times      General Comments      Exercises     Assessment/Plan    PT Assessment Patient needs continued PT services  PT Problem List Decreased strength;Decreased mobility;Decreased activity tolerance;Decreased balance;Pain;Decreased knowledge of use of DME;Decreased cognition       PT Treatment Interventions DME instruction;Gait training;Functional mobility training;Therapeutic activities;Balance training;Patient/family education;Therapeutic exercise    PT Goals (Current goals can be found in the Care Plan section)  Acute Rehab PT Goals Patient Stated Goal: none stated PT Goal  Formulation: With patient/family Time For Goal Achievement: 11/03/17 Potential to Achieve Goals: Good    Frequency Min 3X/week   Barriers to discharge        Co-evaluation               AM-PAC PT "6 Clicks" Daily Activity  Outcome Measure Difficulty turning over in bed (including adjusting bedclothes, sheets and blankets)?: A Little Difficulty moving from lying on back to sitting on the side of the bed? : Unable Difficulty sitting down on and standing up from a chair with arms (e.g., wheelchair, bedside commode, etc,.)?: Unable Help needed moving to and from a bed to chair (including a wheelchair)?: A Little Help needed walking in hospital room?: A Little Help needed climbing 3-5 steps with a railing? : A Little 6 Click Score: 14    End of Session Equipment Utilized During Treatment: Gait belt;Oxygen Activity Tolerance: Patient tolerated treatment well Patient left: in chair;with call bell/phone within reach;with family/visitor present   PT Visit Diagnosis: Difficulty in walking, not elsewhere classified (R26.2);Muscle weakness (generalized) (M62.81);Pain Pain - part of body: (abdomen)    Time: 4315-4008 PT Time Calculation (min) (ACUTE ONLY): 26 min   Charges:   PT Evaluation $PT Eval Moderate Complexity: 1 Mod PT Treatments $Therapeutic Activity: 8-22 mins   PT G Codes:          Rebeca Alert, MPT Pager: [phone]

## 2017-10-20 NOTE — Progress Notes (Signed)
[facility] PROGRESS NOTE    Progress Note  RITAMARIE ARKIN  KDX:[id number] DOB: January 29, 1935 DOA: 10/19/2017 PCP: Jarome Matin, MD     Brief Narrative:   Kalifa LITICIA GASIOR is an 82 y.o. female past medical history significant for severe left ear status post Taber's, rheumatoid arthritis on chronic steroid with iatrogenic adrenal insufficiency and probably early onset dementia, with prior history of E. coli UTIs brought into the ED for confusion that started a day prior to admission  Assessment/Plan:   Sepsis secondary to UTI [facility]) He has been fluid resuscitated and his blood pressure has stabilized and his heart rate has improved. Started empirically on IV meropenem due to multiple episodes of ESBL. Culture data is pending. Has defervesced leukocytosis is improving.  Chronic adrenal insufficiency: Will taper down oral steroids, vitals are stable.  Elevated troponins: He denies any chest pain or shortness of breath. His cardiac biomarkers trended down, twelve-lead EKG showed LVH sinus tachycardia with nonspecific T wave ST segment changes probably LVH repolarization changes. Check a 2D echo.  AVR status post TAVR's: Stable.  Diabetes mellitus type 2: Continue sliding scale insulin.  Rheumatoid arthritis: Will taper down steroids which were increased due to infectious etiology in the setting of iatrogenic adrenal insufficiency.  Memory deficit Stable.  DVT prophylaxis: lovenox Family Communication:none Disposition Plan/Barrier to D/C: home in 2-3 days Code Status:     Code Status Orders  (From admission, onward)        Start     Ordered   10/19/17 0337  Full code  Continuous     10/19/17 0336    Code Status History    Date Active Date Inactive Code Status Order ID Comments User Context   10/20/2015 22:19 10/23/2015 17:13 Full Code [id number]  Bobette Mo, MD Inpatient   03/17/2015 23:58 03/22/2015 21:25 Full Code [id number]  Dorothea Ogle, MD  Inpatient   09/09/2014 21:44 09/16/2014 14:04 Full Code [id number]  Gwen Pounds, MD ED   11/09/2013 20:37 11/17/2013 19:40 Full Code [id number]  Doree Albee, MD ED   09/16/2013 01:37 09/17/2013 18:49 Full Code [id number]  Ezequiel Kayser, MD Inpatient   11/12/2011 08:39 11/18/2011 17:41 Full Code [id number]  Jarome Matin, MD ED   10/01/2011 18:57 10/14/2011 13:15 Full Code [id number]  Gwen Pounds, MD ED        IV Access:    Peripheral IV   Procedures and diagnostic studies:   Dg Chest 2 View  Result Date: 10/19/2017 CLINICAL DATA:  Acute onset of altered mental status. Vomiting and diarrhea. Urinary incontinence. Fever. EXAM: CHEST  2 VIEW COMPARISON:  Chest radiograph performed 10/03/2015 FINDINGS: The lungs are well-aerated and clear. There is no evidence of focal opacification, pleural effusion or pneumothorax. The cardiomediastinal silhouette is borderline normal in size. A vascular stent is noted overlying the aortic valve. No acute osseous abnormalities are seen. There is chronic superior subluxation of the left humeral head. IMPRESSION: No acute cardiopulmonary process seen. Electronically Signed   By: Roanna Raider M.D.   On: 10/19/2017 01:33     Medical Consultants:    None.  Anti-Infectives:  Meropenem  Subjective:    [name] she relates she is hurting around her rib cage. Which is worse with movement and tender to palpation. There are no rashes.   Objective:    Vitals:   10/20/17 0300 10/20/17 0400 10/20/17 0409 10/20/17 0600  BP:  (!) 152/62  (!) 151/62  Pulse: 89 90  91  Resp: 17 (!) 23  (!) 21  Temp:   98.6 F (37 C)   TempSrc:   Oral   SpO2: 95% 98%  98%  Weight:      Height:        Intake/Output Summary (Last 24 hours) at 10/20/2017 0745 Last data filed at 10/20/2017 0700 Gross per 24 hour  Intake 2598.33 ml  Output 550 ml  Net 2048.33 ml   Filed Weights   10/19/17 0119 10/19/17 0455  Weight: 58.5 kg (129 lb) 54.6 kg (120 lb  5.9 oz)    Exam: General exam: In no acute distress. Respiratory system: Good air movement and clear to auscultation. Cardiovascular system: S1 & S2 heard, RRR.   Gastrointestinal system: Abdomen is nondistended, soft and nontender.  Central nervous system: Alert and oriented. No focal neurological deficits. Extremities: No pedal edema. Skin: No rashes, lesions or ulcers Psychiatry: Judgement and insight appear normal. Mood & affect appropriate.    Data Reviewed:    Labs: Basic Metabolic Panel: Recent Labs  Lab 10/19/17 0130 10/20/17 0356  NA 136 139  K 3.4* 3.2*  CL 100* 110  CO2 23 21*  GLUCOSE 140* 83  BUN 21* 14  CREATININE 0.87 0.57  CALCIUM 8.5* 7.4*   GFR Estimated Creatinine Clearance: 46.4 mL/min (by C-G formula based on SCr of 0.57 mg/dL). Liver Function Tests: Recent Labs  Lab 10/19/17 0130  AST 45*  ALT 18  ALKPHOS 76  BILITOT 1.1  PROT 6.7  ALBUMIN 3.4*   No results for input(s): LIPASE, AMYLASE in the last 168 hours. No results for input(s): AMMONIA in the last 168 hours. Coagulation profile Recent Labs  Lab 10/19/17 0130  INR 1.15    CBC: Recent Labs  Lab 10/19/17 0130 10/20/17 0356  WBC 17.7* 14.1*  NEUTROABS 15.0*  --   HGB 12.1 9.9*  HCT 36.2 29.9*  MCV 97.3 96.8  PLT 134* 103*   Cardiac Enzymes: Recent Labs  Lab 10/19/17 0158 10/19/17 0633  TROPONINI 1.54* 1.16*   BNP (last 3 results) No results for input(s): PROBNP in the last 8760 hours. CBG: Recent Labs  Lab 10/19/17 1131 10/19/17 1630 10/19/17 1923 10/19/17 2113 10/20/17 0733  GLUCAP 113* 103* 91 117* 72   D-Dimer: No results for input(s): DDIMER in the last 72 hours. Hgb A1c: No results for input(s): HGBA1C in the last 72 hours. Lipid Profile: No results for input(s): CHOL, HDL, LDLCALC, TRIG, CHOLHDL, LDLDIRECT in the last 72 hours. Thyroid function studies: No results for input(s): TSH, T4TOTAL, T3FREE, THYROIDAB in the last 72 hours.  Invalid  input(s): FREET3 Anemia work up: No results for input(s): VITAMINB12, FOLATE, FERRITIN, TIBC, IRON, RETICCTPCT in the last 72 hours. Sepsis Labs: Recent Labs  Lab 10/19/17 0130 10/19/17 0151 10/19/17 0441 10/20/17 0356  WBC 17.7*  --   --  14.1*  LATICACIDVEN  --  2.93* 1.61  --    Microbiology Recent Results (from the past 240 hour(s))  MRSA PCR Screening     Status: None   Collection Time: 10/19/17  5:07 AM  Result Value Ref Range Status   MRSA by PCR NEGATIVE NEGATIVE Final    Comment:        The GeneXpert MRSA Assay (FDA approved for NASAL specimens only), is one component of a comprehensive MRSA colonization surveillance program. It is not intended to diagnose MRSA infection nor to guide or monitor treatment for MRSA infections.      Medications:   .  enoxaparin (LOVENOX) injection  40 mg Subcutaneous Q24H  . feeding supplement (ENSURE ENLIVE)  237 mL Oral BID BM  . insulin aspart  0-9 Units Subcutaneous TID WC  . predniSONE  25 mg Oral Q breakfast   Continuous Infusions: . meropenem (MERREM) IV Stopped (10/19/17 2048)     LOS: 1 day   Marinda Elk  [facility] Pager [phone]  *Please refer to [email or web address], password [email or web address] to get updated schedule on who will round on this patient, as hospitalists switch teams weekly. If 7PM-7AM, please contact night-coverage at [email or web address], password [email or web address] for any overnight needs.  10/20/2017, 7:45 AM

## 2017-10-21 ENCOUNTER — Inpatient Hospital Stay (HOSPITAL_COMMUNITY): Payer: Medicare Other

## 2017-10-21 DIAGNOSIS — I361 Nonrheumatic tricuspid (valve) insufficiency: Secondary | ICD-10-CM

## 2017-10-21 LAB — CBC
HCT: 32.2 % — ABNORMAL LOW (ref 36.0–46.0)
Hemoglobin: 10.8 g/dL — ABNORMAL LOW (ref 12.0–15.0)
MCH: 32.2 pg (ref 26.0–34.0)
MCHC: 33.5 g/dL (ref 30.0–36.0)
MCV: 96.1 fL (ref 78.0–100.0)
PLATELETS: 98 10*3/uL — AB (ref 150–400)
RBC: 3.35 MIL/uL — ABNORMAL LOW (ref 3.87–5.11)
RDW: 14.3 % (ref 11.5–15.5)
WBC: 10.1 10*3/uL (ref 4.0–10.5)

## 2017-10-21 LAB — BASIC METABOLIC PANEL
Anion gap: 6 (ref 5–15)
BUN: 12 mg/dL (ref 6–20)
CHLORIDE: 107 mmol/L (ref 101–111)
CO2: 27 mmol/L (ref 22–32)
CREATININE: 0.49 mg/dL (ref 0.44–1.00)
Calcium: 8.1 mg/dL — ABNORMAL LOW (ref 8.9–10.3)
GFR calc Af Amer: >60 mL/min (ref >60)
GFR calc non Af Amer: >60 mL/min (ref >60)
GLUCOSE: 97 mg/dL (ref 65–99)
Potassium: 3.3 mmol/L — ABNORMAL LOW (ref 3.5–5.1)
Sodium: 140 mmol/L (ref 135–145)

## 2017-10-21 LAB — URINE CULTURE

## 2017-10-21 LAB — GLUCOSE, CAPILLARY
Glucose-Capillary: 77 mg/dL (ref 65–99)
Glucose-Capillary: 125 mg/dL — ABNORMAL HIGH (ref 65–99)
Glucose-Capillary: 164 mg/dL — ABNORMAL HIGH (ref 65–99)
Glucose-Capillary: 140 mg/dL — ABNORMAL HIGH (ref 65–99)

## 2017-10-21 LAB — ECHOCARDIOGRAM COMPLETE
HEIGHTINCHES: 63 in
WEIGHTICAEL: 1925.94 [oz_av]

## 2017-10-21 MED ORDER — SULFAMETHOXAZOLE-TRIMETHOPRIM 800-160 MG PO TABS
1.0000 | ORAL_TABLET | Freq: Two times a day (BID) | ORAL | Status: DC
  Administered 2017-10-21 – 2017-10-22 (×3): 1 via ORAL
  Filled 2017-10-21 (×3): qty 1

## 2017-10-21 MED ORDER — LIP MEDEX EX OINT
TOPICAL_OINTMENT | CUTANEOUS | Status: DC | PRN
  Administered 2017-10-21: 15:00:00 via TOPICAL
  Filled 2017-10-21: qty 7

## 2017-10-21 MED ORDER — POTASSIUM CHLORIDE CRYS ER 20 MEQ PO TBCR
40.0000 meq | EXTENDED_RELEASE_TABLET | Freq: Two times a day (BID) | ORAL | Status: AC
Start: 2017-10-21 — End: 2017-10-21
  Administered 2017-10-21 (×2): 40 meq via ORAL
  Filled 2017-10-21 (×2): qty 2

## 2017-10-21 MED ORDER — PREDNISONE 10 MG PO TABS
10.0000 mg | ORAL_TABLET | Freq: Every day | ORAL | Status: DC
  Administered 2017-10-21 – 2017-10-22 (×2): 10 mg via ORAL
  Filled 2017-10-21 (×2): qty 1

## 2017-10-21 NOTE — Progress Notes (Signed)
Patient arrived on unit from 2W.  No family at bedside.

## 2017-10-21 NOTE — Progress Notes (Signed)
[facility] PROGRESS NOTE    Progress Note  [name]  RXV:[id number] DOB: 06-17-1935 DOA: 10/19/2017 PCP: Jarome Matin, MD     Brief Narrative:   [name] is an 83 y.o. female past medical history significant for severe left ear status post Taber's, rheumatoid arthritis on chronic steroid with iatrogenic adrenal insufficiency and probably early onset dementia, with prior history of E. coli UTIs brought into the ED for confusion that started a day prior to admission  Assessment/Plan:   Sepsis secondary to UTI [facility]) She has been fluid resuscitated and his blood pressure has stabilized and his heart rate has improved. Started empirically on IV meropenem due to multiple episodes of ESBL. Culture data E. coli sensitive to Bactrim. We will change antibiotics to Bactrim  Chronic adrenal insufficiency: Will taper down oral steroids, vitals are stable.  Elevated troponins: He denies any chest pain or shortness of breath. His cardiac biomarkers trended down, twelve-lead EKG showed LVH sinus tachycardia with nonspecific T wave ST segment changes probably LVH repolarization changes. 2D echo results still pending.  AVR status post TAVR's: Stable.  Diabetes mellitus type 2: Continue sliding scale insulin.  Rheumatoid arthritis: Cont to taper down steroids which were increased due to infectious etiology in the setting of iatrogenic adrenal insufficiency.  Memory deficit Stable.  Thrombocytopenia: Likely due to infectious etiology, has remained around 100 k,.  DVT prophylaxis: lovenox Family Communication:none Disposition Plan/Barrier to D/C: home in 1 days, will need to go to skilled nursing facility transfer to MedSurg Code Status:     Code Status Orders  (From admission, onward)        Start     Ordered   10/19/17 0337  Full code  Continuous     10/19/17 0336    Code Status History    Date Active Date Inactive Code Status Order ID Comments User  Context   10/20/2015 22:19 10/23/2015 17:13 Full Code [id number]  Bobette Mo, MD Inpatient   03/17/2015 23:58 03/22/2015 21:25 Full Code [id number]  Dorothea Ogle, MD Inpatient   09/09/2014 21:44 09/16/2014 14:04 Full Code [id number]  Gwen Pounds, MD ED   11/09/2013 20:37 11/17/2013 19:40 Full Code [id number]  Doree Albee, MD ED   09/16/2013 01:37 09/17/2013 18:49 Full Code [id number]  Ezequiel Kayser, MD Inpatient   11/12/2011 08:39 11/18/2011 17:41 Full Code [id number]  Jarome Matin, MD ED   10/01/2011 18:57 10/14/2011 13:15 Full Code [id number]  Gwen Pounds, MD ED        IV Access:    Peripheral IV   Procedures and diagnostic studies:   No results found.   Medical Consultants:    None.  Anti-Infectives:  Meropenem  Subjective:    [name] she relates she feels much better than yesterday.  Objective:    Vitals:   10/20/17 2000 10/21/17 0019 10/21/17 0400 10/21/17 0600  BP: (!) 133/53 (!) 130/50 (!) 119/46 (!) 167/62  Pulse: 79 65 66 85  Resp: (!) 22 20 15 16   Temp:  97.6 F (36.4 C) 97.6 F (36.4 C)   TempSrc:  Oral Oral   SpO2: 94% 99% 99% 92%  Weight:      Height:        Intake/Output Summary (Last 24 hours) at 10/21/2017 0754 Last data filed at 10/21/2017 0600 Gross per 24 hour  Intake 940 ml  Output 1102 ml  Net -162 ml   Filed Weights   10/19/17 0119 10/19/17 0455  Weight: 58.5 kg (129 lb) 54.6 kg (120 lb 5.9 oz)    Exam: General exam: In no acute distress. Respiratory system: Good air movement and clear to auscultation. Cardiovascular system: S1 & S2 heard, RRR.   Gastrointestinal system: Abdomen is nondistended, soft and nontender.  Central nervous system: Alert and oriented. No focal neurological deficits. Extremities: No pedal edema. Skin: No rashes, lesions or ulcers Psychiatry: Judgement and insight appear normal. Mood & affect appropriate.    Data Reviewed:    Labs: Basic Metabolic Panel: Recent Labs  Lab  10/19/17 0130 10/20/17 0356 10/21/17 0310  NA 136 139 140  K 3.4* 3.2* 3.3*  CL 100* 110 107  CO2 23 21* 27  GLUCOSE 140* 83 97  BUN 21* 14 12  CREATININE 0.87 0.57 0.49  CALCIUM 8.5* 7.4* 8.1*   GFR Estimated Creatinine Clearance: 46.4 mL/min (by C-G formula based on SCr of 0.49 mg/dL). Liver Function Tests: Recent Labs  Lab 10/19/17 0130  AST 45*  ALT 18  ALKPHOS 76  BILITOT 1.1  PROT 6.7  ALBUMIN 3.4*   No results for input(s): LIPASE, AMYLASE in the last 168 hours. No results for input(s): AMMONIA in the last 168 hours. Coagulation profile Recent Labs  Lab 10/19/17 0130  INR 1.15    CBC: Recent Labs  Lab 10/19/17 0130 10/20/17 0356 10/21/17 0310  WBC 17.7* 14.1* 10.1  NEUTROABS 15.0*  --   --   HGB 12.1 9.9* 10.8*  HCT 36.2 29.9* 32.2*  MCV 97.3 96.8 96.1  PLT 134* 103* 98*   Cardiac Enzymes: Recent Labs  Lab 10/19/17 0158 10/19/17 0633  TROPONINI 1.54* 1.16*   BNP (last 3 results) No results for input(s): PROBNP in the last 8760 hours. CBG: Recent Labs  Lab 10/20/17 0733 10/20/17 1209 10/20/17 1546 10/20/17 2119 10/21/17 0733  GLUCAP 72 122* 129* 118* 77   D-Dimer: No results for input(s): DDIMER in the last 72 hours. Hgb A1c: No results for input(s): HGBA1C in the last 72 hours. Lipid Profile: No results for input(s): CHOL, HDL, LDLCALC, TRIG, CHOLHDL, LDLDIRECT in the last 72 hours. Thyroid function studies: No results for input(s): TSH, T4TOTAL, T3FREE, THYROIDAB in the last 72 hours.  Invalid input(s): FREET3 Anemia work up: No results for input(s): VITAMINB12, FOLATE, FERRITIN, TIBC, IRON, RETICCTPCT in the last 72 hours. Sepsis Labs: Recent Labs  Lab 10/19/17 0130 10/19/17 0151 10/19/17 0441 10/20/17 0356 10/21/17 0310  WBC 17.7*  --   --  14.1* 10.1  LATICACIDVEN  --  2.93* 1.61  --   --    Microbiology Recent Results (from the past 240 hour(s))  Culture, blood (Routine x 2)     Status: None (Preliminary result)    Collection Time: 10/19/17  1:30 AM  Result Value Ref Range Status   Specimen Description BLOOD LEFT FOREARM  Final   Special Requests   Final    BOTTLES DRAWN AEROBIC AND ANAEROBIC Blood Culture adequate volume   Culture   Final    NO GROWTH 1 DAY Performed at [facility], [address] [place], [place] [address]    Report Status PENDING  Incomplete  Culture, blood (Routine x 2)     Status: None (Preliminary result)   Collection Time: 10/19/17  1:35 AM  Result Value Ref Range Status   Specimen Description BLOOD RIGHT HAND  Final   Special Requests   Final    BOTTLES DRAWN AEROBIC AND ANAEROBIC Blood Culture adequate volume  Culture   Final    NO GROWTH 1 DAY Performed at [facility], [address], [place], [place] [address]    Report Status PENDING  Incomplete  Urine culture     Status: Abnormal   Collection Time: 10/19/17  3:19 AM  Result Value Ref Range Status   Specimen Description URINE, CATHETERIZED  Final   Special Requests NONE  Final   Culture (A)  Final    >=100,000 COLONIES/mL ESCHERICHIA COLI Confirmed Extended Spectrum Beta-Lactamase Producer (ESBL).  In bloodstream infections from ESBL organisms, carbapenems are preferred over piperacillin/tazobactam. They are shown to have a lower risk of mortality. Performed at [facility], [address], [place], [place] [address]    Report Status 10/21/2017 FINAL  Final   Organism ID, Bacteria ESCHERICHIA COLI (A)  Final      Susceptibility   Escherichia coli - MIC*    AMPICILLIN >=32 RESISTANT Resistant     CEFAZOLIN >=64 RESISTANT Resistant     CEFTRIAXONE RESISTANT Resistant     CIPROFLOXACIN >=4 RESISTANT Resistant     GENTAMICIN <=1 SENSITIVE Sensitive     IMIPENEM <=0.25 SENSITIVE Sensitive     NITROFURANTOIN <=16 SENSITIVE Sensitive     TRIMETH/SULFA <=20 SENSITIVE Sensitive     AMPICILLIN/SULBACTAM 8 SENSITIVE Sensitive     PIP/TAZO <=4 SENSITIVE Sensitive     Extended ESBL POSITIVE  Resistant     * >=100,000 COLONIES/mL ESCHERICHIA COLI  MRSA PCR Screening     Status: None   Collection Time: 10/19/17  5:07 AM  Result Value Ref Range Status   MRSA by PCR NEGATIVE NEGATIVE Final    Comment:        The GeneXpert MRSA Assay (FDA approved for NASAL specimens only), is one component of a comprehensive MRSA colonization surveillance program. It is not intended to diagnose MRSA infection nor to guide or monitor treatment for MRSA infections.      Medications:   . bisoprolol  2.5 mg Oral Daily  . buPROPion  100 mg Oral q morning - 10a  . enoxaparin (LOVENOX) injection  40 mg Subcutaneous Q24H  . feeding supplement (ENSURE ENLIVE)  237 mL Oral BID BM  . insulin aspart  0-9 Units Subcutaneous TID WC  . latanoprost  1 drop Both Eyes QHS  . leflunomide  10 mg Oral Daily  . mirtazapine  30 mg Oral QHS  . nortriptyline  75 mg Oral QHS  . potassium chloride  40 mEq Oral BID  . predniSONE  15 mg Oral Q breakfast  . vitamin B-12  1,000 mcg Oral Daily   Continuous Infusions: . meropenem (MERREM) IV Stopped (10/20/17 2122)     LOS: 2 days   Marinda Elk  [facility] Pager [phone]  *Please refer to [email or web address], password [email or web address] to get updated schedule on who will round on this patient, as hospitalists switch teams weekly. If 7PM-7AM, please contact night-coverage at [email or web address], password [email or web address] for any overnight needs.  10/21/2017, 7:54 AM

## 2017-10-21 NOTE — Clinical Social Work Note (Signed)
Clinical Social Work Assessment  Patient Details  Name: [name] MRN: [id number] Date of Birth: [birth year]  Date of referral:  10/21/17               Reason for consult:  Facility Placement                Permission sought to share information with:  Oceanographer granted to share information::  No  Name::        Agency::     Relationship::     Contact Information:     Housing/Transportation Living arrangements for the past 2 months:  Single Family Home Source of Information:  Patient Patient Interpreter Needed:  None Criminal Activity/Legal Involvement Pertinent to Current Situation/Hospitalization:  No - Comment as needed Significant Relationships:  Adult Children Lives with:  Self Do you feel safe going back to the place where you live?  Yes(PT recommending SNF; Home Health PT;Supervision 24 hour (depending on progress)) Need for family participation in patient care:  No (Coment)  Care giving concerns:  Patient from home alone. Patient reports that her daughter comes to her home twice daily and brings her meals. Patient reported that prior to hospitalization that she was independent with ADLs. PT recommending SNF; Home Health PT; Supervision 24 hour.    Social Worker assessment / plan:  CSW spoke with patient at bedside regarding PT recommendation for SNF; Home Health PT; Supervision 24 hour. Patient reported that she is not interested in SNF and that her plan is to return home. Patient reported that she feels safe returning home and that she has support. Patient reported that she is agreeable to home health services, CSW agreed to notify RNCM. CSW notified RNCM about patient's interest in home health services. CSW signing off, no other needs identified at this time.   Employment status:  Retired Database administrator PT Recommendations:  Skilled Holiday representative, Home with Home Health, 24 Hour Supervision Information /  Referral to community resources:   RNCM for Home health Services  Patient/Family's Response to care:  Patient declined SNF, plan to dc home. Patient agreeable to home health services, RNCM notified.   Patient/Family's Understanding of and Emotional Response to Diagnosis, Current Treatment, and Prognosis:  Patient presented calm and verbalized some understanding of diagnosis. Patient verbalized plan to dc home.   Emotional Assessment Appearance:  Appears stated age Attitude/Demeanor/Rapport:  Other(Open) Affect (typically observed):  Calm Orientation:  Oriented to Self, Oriented to Place, Oriented to  Time, Oriented to Situation Alcohol / Substance use:  Not Applicable Psych involvement (Current and /or in the community):  No (Comment)  Discharge Needs  Concerns to be addressed:    Readmission within the last 30 days:  No Current discharge risk:  Physical Impairment Barriers to Discharge:  Continued Medical Work up   [employer], LCSW 10/21/2017, 11:47 AM

## 2017-10-21 NOTE — Progress Notes (Signed)
Physical Therapy Treatment Patient Details Name: [name] MRN: [id number] DOB: 10/21/1935 Today's Date: 10/21/2017    History of Present Illness 83 yo female admitted with UTI, sespsis, AMS. Hx of aortic stenosis, RA, osteoporosis, gait instability, syncope, fall, memory deficits.     PT Comments    Pt is progressing with mobility and cognition is improving. Pt still demonstrates poor safety awareness at times. She is at risk for falls currently. She required assistance for toilet hygiene. Discussed d/c plan-pt stated she does not want to go to a facility during PT session. She stated her daughter Lynden Ang can assist intermittently (she works during the day). Pt still does not appear safe to d/c home alone at this time. Will continue to follow.     Follow Up Recommendations  SNF (If pt refuses placement, then HHPT and 24 hour supervision/assist until she returns to baseline)     Equipment Recommendations  None recommended by PT    Recommendations for Other Services       Precautions / Restrictions Precautions Precautions: Fall Restrictions Weight Bearing Restrictions: No    Mobility  Bed Mobility Overal bed mobility: Needs Assistance Bed Mobility: Supine to Sit;Sit to Supine     Supine to sit: Supervision Sit to supine: Supervision   General bed mobility comments: for safety.   Transfers Overall transfer level: Needs assistance Equipment used: Rolling walker (2 wheeled) Transfers: Sit to/from Stand Sit to Stand: Min assist Stand pivot transfers: Min assist       General transfer comment: assist to rise, balance, and maneuver safely with walker. VCs safety. STand pivot, bed to bsc.    Ambulation/Gait Ambulation/Gait assistance: Min assist Ambulation Distance (Feet): 140 Feet Assistive device: Rolling walker (2 wheeled) Gait Pattern/deviations: Step-through pattern;Decreased stride length     General Gait Details: Assist to stabilize throughout distance.  Cues for safety, distance from RW.    Stairs            Wheelchair Mobility    Modified Rankin (Stroke Patients Only)       Balance Overall balance assessment: Needs assistance           Standing balance-Leahy Scale: Poor                              Cognition Arousal/Alertness: Awake/alert Behavior During Therapy: WFL for tasks assessed/performed Overall Cognitive Status: Within Functional Limits for tasks assessed                                        Exercises      General Comments        Pertinent Vitals/Pain Pain Assessment: No/denies pain    Home Living                      Prior Function            PT Goals (current goals can now be found in the care plan section) Progress towards PT goals: Progressing toward goals    Frequency    Min 3X/week      PT Plan Current plan remains appropriate    Co-evaluation              AM-PAC PT "6 Clicks" Daily Activity  Outcome Measure  Difficulty turning over in bed (including adjusting bedclothes, sheets and blankets)?: A  Little Difficulty moving from lying on back to sitting on the side of the bed? : A Little Difficulty sitting down on and standing up from a chair with arms (e.g., wheelchair, bedside commode, etc,.)?: Unable Help needed moving to and from a bed to chair (including a wheelchair)?: A Little Help needed walking in hospital room?: A Little Help needed climbing 3-5 steps with a railing? : A Little 6 Click Score: 16    End of Session Equipment Utilized During Treatment: Gait belt Activity Tolerance: Patient tolerated treatment well Patient left: in bed;with call bell/phone within reach;with bed alarm set   PT Visit Diagnosis: Muscle weakness (generalized) (M62.81);Difficulty in walking, not elsewhere classified (R26.2)     Time: 9381-8299 PT Time Calculation (min) (ACUTE ONLY): 24 min  Charges:  $Gait Training: 8-22  mins $Therapeutic Activity: 8-22 mins                    G Codes:          Rebeca Alert, MPT Pager: [phone]

## 2017-10-22 LAB — BASIC METABOLIC PANEL
ANION GAP: 5 (ref 5–15)
BUN: 16 mg/dL (ref 6–20)
CALCIUM: 8.5 mg/dL — AB (ref 8.9–10.3)
CO2: 26 mmol/L (ref 22–32)
CREATININE: 0.5 mg/dL (ref 0.44–1.00)
Chloride: 109 mmol/L (ref 101–111)
Glucose, Bld: 95 mg/dL (ref 65–99)
Potassium: 4.6 mmol/L (ref 3.5–5.1)
SODIUM: 140 mmol/L (ref 135–145)

## 2017-10-22 LAB — CBC
HCT: 32.7 % — ABNORMAL LOW (ref 36.0–46.0)
HEMOGLOBIN: 10.8 g/dL — AB (ref 12.0–15.0)
MCH: 31.1 pg (ref 26.0–34.0)
MCHC: 33 g/dL (ref 30.0–36.0)
MCV: 94.2 fL (ref 78.0–100.0)
PLATELETS: 105 10*3/uL — AB (ref 150–400)
RBC: 3.47 MIL/uL — AB (ref 3.87–5.11)
RDW: 14.2 % (ref 11.5–15.5)
WBC: 10.4 10*3/uL (ref 4.0–10.5)

## 2017-10-22 LAB — GLUCOSE, CAPILLARY
GLUCOSE-CAPILLARY: 82 mg/dL (ref 65–99)
Glucose-Capillary: 125 mg/dL — ABNORMAL HIGH (ref 65–99)

## 2017-10-22 MED ORDER — SULFAMETHOXAZOLE-TRIMETHOPRIM 800-160 MG PO TABS: 1.0000 | ORAL_TABLET | Freq: Two times a day (BID) | ORAL | 0 refills | Status: DC

## 2017-10-22 NOTE — NC FL2 (Signed)
[place] MEDICAID FL2 LEVEL OF CARE SCREENING TOOL     IDENTIFICATION  Patient Name: [name] Birthdate: Nov 25, 1934 Sex: female Admission Date (Current Location): 10/19/2017  [facility] and [place] Number:  Producer, television/film/video and Address:  [facility],  [address], [place] [address]      Provider Number: [id number]  Attending Physician Name and Address:  Marinda Elk, MD  Relative Name and Phone Number:       Current Level of Care: Hospital Recommended Level of Care: Skilled Nursing Facility Prior Approval Number:    Date Approved/Denied:   PASRR Number:    Discharge Plan: SNF    Current Diagnoses: Patient Active Problem List   Diagnosis Date Noted  . Sepsis associated hypotension (HCC) 10/19/2017  . Elevated troponin 10/19/2017  . Dizziness 07/24/2016  . Dyspnea 12/06/2015  . Hypoxia 12/06/2015  . Hypokalemia 10/21/2015  . Hypophosphatemia 10/21/2015  . Hypomagnesemia 10/21/2015  . CAP (community acquired pneumonia) 10/20/2015  . B12 deficiency   . Malnutrition of moderate degree (HCC) 03/18/2015  . Altered mental state 03/17/2015  . Metabolic encephalopathy 09/12/2014    Class: Acute  . Sepsis secondary to UTI (HCC) 09/09/2014  . S/P TAVR (transcatheter aortic valve replacement) 03/16/2014  . Left rib fracture 11/17/2013  . Closed pelvic fracture (HCC) 11/17/2013  . Sepsis (HCC) 11/10/2013  . UTI (lower urinary tract infection) 11/09/2013  . Sepsis syndrome 09/17/2013  . COPD (chronic obstructive pulmonary disease) (HCC) 07/20/2013  . Chest pain 06/29/2013  . Hearing loss 03/25/2013  . Memory deficit 03/25/2013  . Depression 03/25/2013  . Syncope 03/25/2013  . Pulmonary fibrosis (HCC)   . Aortic stenosis, severe 02/26/2013  . Pancreatic insufficiency (HCC) 11/09/2012    Class: Chronic  . Low back pain 01/16/2012    Class: Acute  . Gait instability 01/16/2012    Class: Acute  . Sinusitis, acute, sphenoidal  11/17/2011    Class: Acute  . Pneumonia 11/12/2011    Class: Acute  . Chronic adrenal insufficiency (HCC) 11/12/2011    Class: Chronic  . E. coli UTI (urinary tract infection) 10/01/2011  . Fever 10/01/2011  . Cough 10/01/2011  . Failure to thrive in adult 10/01/2011  . Weakness generalized 10/01/2011  . Rheumatoid arthritis involving multiple joints (HCC) 10/01/2011  . DM2 (diabetes mellitus, type 2) (HCC) 10/01/2011    Orientation RESPIRATION BLADDER Height & Weight     Self, Time, Situation, Place  Normal Continent Weight: 120 lb 5.9 oz (54.6 kg) Height:  5\' 3"  (160 cm)  BEHAVIORAL SYMPTOMS/MOOD NEUROLOGICAL BOWEL NUTRITION STATUS      Continent Diet(See DC summary)  AMBULATORY STATUS COMMUNICATION OF NEEDS Skin   Extensive Assist Verbally Normal                       Personal Care Assistance Level of Assistance  Bathing, Feeding, Dressing Bathing Assistance: Limited assistance Feeding assistance: Independent Dressing Assistance: Limited assistance     Functional Limitations Info  Sight, Hearing, Speech Sight Info: Adequate Hearing Info: Adequate Speech Info: Adequate    SPECIAL CARE FACTORS FREQUENCY  PT (By licensed PT), OT (By licensed OT)     PT Frequency: 5x/week OT Frequency: 5x/week            Contractures      Additional Factors Info  Code Status, Allergies, Isolation Precautions Code Status Info: Full Allergies Info: Adhesive Tape, Penicillins     Isolation Precautions Info: ESBL  Current Medications (10/22/2017):  This is the current hospital active medication list Current Facility-Administered Medications  Medication Dose Route Frequency Provider Last Rate Last Dose  . acetaminophen (TYLENOL) tablet 650 mg  650 mg Oral Q6H PRN Hillary Bow, DO   650 mg at 10/20/17 7564   Or  . acetaminophen (TYLENOL) suppository 650 mg  650 mg Rectal Q6H PRN Hillary Bow, DO      . ALPRAZolam Prudy Feeler) tablet 0.5 mg  0.5 mg Oral TID PRN  Marinda Elk, MD   0.5 mg at 10/21/17 2100  . bisoprolol (ZEBETA) tablet 2.5 mg  2.5 mg Oral Daily Marinda Elk, MD   2.5 mg at 10/22/17 1000  . buPROPion [facility]) tablet 100 mg  100 mg Oral q morning - 10a Marinda Elk, MD   100 mg at 10/22/17 1000  . enoxaparin (LOVENOX) injection 40 mg  40 mg Subcutaneous Q24H Lyda Perone M, DO   40 mg at 10/22/17 1003  . feeding supplement (ENSURE ENLIVE) (ENSURE ENLIVE) liquid 237 mL  237 mL Oral BID BM Danford, Earl Lites, MD   237 mL at 10/20/17 1633  . insulin aspart (novoLOG) injection 0-9 Units  0-9 Units Subcutaneous TID WC Hillary Bow, DO   2 Units at 10/21/17 1652  . latanoprost (XALATAN) 0.005 % ophthalmic solution 1 drop  1 drop Both Eyes QHS Marinda Elk, MD   1 drop at 10/21/17 2100  . leflunomide (ARAVA) tablet 10 mg  10 mg Oral Daily Marinda Elk, MD   10 mg at 10/22/17 1131  . lip balm (CARMEX) ointment   Topical PRN Marinda Elk, MD      . mirtazapine (REMERON) tablet 30 mg  30 mg Oral QHS Marinda Elk, MD   30 mg at 10/21/17 2059  . nortriptyline (PAMELOR) capsule 75 mg  75 mg Oral QHS Marinda Elk, MD   75 mg at 10/21/17 2100  . ondansetron (ZOFRAN) tablet 4 mg  4 mg Oral Q6H PRN Hillary Bow, DO       Or  . ondansetron [facility]) injection 4 mg  4 mg Intravenous Q6H PRN Hillary Bow, DO      . predniSONE (DELTASONE) tablet 10 mg  10 mg Oral Q breakfast Marinda Elk, MD   10 mg at 10/22/17 0831  . sulfamethoxazole-trimethoprim (BACTRIM DS,SEPTRA DS) 800-160 MG per tablet 1 tablet  1 tablet Oral Q12H Marinda Elk, MD   1 tablet at 10/22/17 1000  . vitamin B-12 (CYANOCOBALAMIN) tablet 1,000 mcg  1,000 mcg Oral Daily Marinda Elk, MD   1,000 mcg at 10/22/17 1000     Discharge Medications: Please see discharge summary for a list of discharge medications.  Relevant Imaging Results:  Relevant Lab Results:   Additional  Information ssn: [id number]  Coralyn Helling, LCSW

## 2017-10-22 NOTE — Discharge Summary (Signed)
Physician Discharge Summary  LALISA SADOWSKY ZMO:[id number] DOB: 1935/01/27 DOA: 10/19/2017  PCP: Jarome Matin, MD  Admit date: 10/19/2017 Discharge date: 10/22/2017  Admitted From: Home Disposition:  Home  Recommendations for Outpatient Follow-up:  1. Follow up with PCP in 1-2 weeks 2. Will go to SNF  Home Health:No Equipment/Devices:none  Discharge Condition:stable CODE STATUS:Full Diet recommendation: Heart Healthy   Brief/Interim Summary: 83 y.o. female past medical history significant for severe left ear status post Taber's, rheumatoid arthritis on chronic steroid with iatrogenic adrenal insufficiency and probably early onset dementia, with prior history of E. coli UTIs brought into the ED for confusion that started a day prior to admission  Discharge Diagnoses:  Principal Problem:   Sepsis secondary to UTI [facility]) Active Problems:   Rheumatoid arthritis involving multiple joints (HCC)   DM2 (diabetes mellitus, type 2) (HCC)   Chronic adrenal insufficiency (HCC)   Memory deficit   S/P TAVR (transcatheter aortic valve replacement)   Sepsis associated hypotension (HCC)   Elevated troponin  Sepsis secondary to UTI (HCC) She was started empirically on IV fluids and antibiotics. To her history of ESBL she was started on IV meropenem Culture data E. coli sensitive to Bactrim. Was changed to Bactrim which she will continue for 1 week.  Chronic adrenal insufficiency: Stable, no changes were made to her medication.  Elevated troponins: He denies any chest pain or shortness of breath. 2D echo showed no wall motion abnormalities likely due to demand ischemia.  AVR status post TAVR's: Stable.  Diabetes mellitus type 2: Continue sliding scale insulin.  Rheumatoid arthritis: No changes made.  Memory deficit Stable.  Thrombocytopenia: Likely due to infectious etiology, improving.   Discharge Instructions  Discharge Instructions    Diet - low sodium  heart healthy   Complete by:  As directed    Increase activity slowly   Complete by:  As directed      Allergies as of 10/22/2017      Reactions   Adhesive [tape] Other (See Comments)   Skin peels up.    Penicillins Hives   Has patient had a PCN reaction causing immediate rash, facial/tongue/throat swelling, SOB or lightheadedness with hypotension: No Has patient had a PCN reaction causing severe rash involving mucus membranes or skin necrosis: No Has patient had a PCN reaction that required hospitalization No Has patient had a PCN reaction occurring within the last 10 years: No If all of the above answers are "NO", then may proceed with Cephalosporin use.      Medication List    TAKE these medications   ALPRAZolam 1 MG tablet Commonly known as:  XANAX Take 0.5 mg by mouth 3 (three) times daily as needed. Anxiety   bisoprolol 5 MG tablet Commonly known as:  ZEBETA Take 0.5 tablets (2.5 mg total) by mouth daily.   buPROPion 100 MG tablet Commonly known as:  WELLBUTRIN Take 100 mg by mouth every morning.   leflunomide 10 MG tablet Commonly known as:  ARAVA Take 10 mg by mouth daily.   mirtazapine 30 MG tablet Commonly known as:  REMERON Take 30 mg by mouth at bedtime.   nortriptyline 75 MG capsule Commonly known as:  PAMELOR Take 75 mg by mouth at bedtime.   predniSONE 5 MG tablet Commonly known as:  DELTASONE Take 1.5 tablets (7.5 mg total) by mouth daily with breakfast. Resume in 4days   sulfamethoxazole-trimethoprim 800-160 MG tablet Commonly known as:  BACTRIM DS,SEPTRA DS Take 1 tablet by mouth every 12 (  twelve) hours.   TRAVATAN Z 0.004 % Soln ophthalmic solution Generic drug:  Travoprost (BAK Free) Place 1 drop into both eyes at bedtime.   vitamin B-12 1000 MCG tablet Commonly known as:  CYANOCOBALAMIN Take 1 tablet (1,000 mcg total) by mouth daily.      Follow-up Information    Jarome Matin, MD.   Specialty:  Internal Medicine Contact  information: [address] [place] [address] [phone]          Allergies  Allergen Reactions  . Adhesive [Tape] Other (See Comments)    Skin peels up.   Marland Kitchen Penicillins Hives    Has patient had a PCN reaction causing immediate rash, facial/tongue/throat swelling, SOB or lightheadedness with hypotension: No Has patient had a PCN reaction causing severe rash involving mucus membranes or skin necrosis: No Has patient had a PCN reaction that required hospitalization No Has patient had a PCN reaction occurring within the last 10 years: No If all of the above answers are "NO", then may proceed with Cephalosporin use.     Consultations:  None   Procedures/Studies: Dg Chest 2 View  Result Date: 10/19/2017 CLINICAL DATA:  Acute onset of altered mental status. Vomiting and diarrhea. Urinary incontinence. Fever. EXAM: CHEST  2 VIEW COMPARISON:  Chest radiograph performed 10/03/2015 FINDINGS: The lungs are well-aerated and clear. There is no evidence of focal opacification, pleural effusion or pneumothorax. The cardiomediastinal silhouette is borderline normal in size. A vascular stent is noted overlying the aortic valve. No acute osseous abnormalities are seen. There is chronic superior subluxation of the left humeral head. IMPRESSION: No acute cardiopulmonary process seen. Electronically Signed   By: Roanna Raider M.D.   On: 10/19/2017 01:33      Subjective: Patient has no new complaints she agreed to go to skilled nursing facility.  Discharge Exam: Vitals:   10/21/17 2100 10/22/17 0618  BP: 138/64 (!) 157/60  Pulse: 81 87  Resp: 20 20  Temp: 97.9 F (36.6 C) (!) 97.4 F (36.3 C)  SpO2: 93% 97%   Vitals:   10/21/17 1023 10/21/17 1454 10/21/17 2100 10/22/17 0618  BP: (!) 153/66 (!) 127/54 138/64 (!) 157/60  Pulse: 86 83 81 87  Resp: 18 20 20 20   Temp: 98.5 F (36.9 C) 98.2 F (36.8 C) 97.9 F (36.6 C) (!) 97.4 F (36.3 C)  TempSrc: Oral Oral Oral Oral   SpO2: 98% 93% 93% 97%  Weight:      Height:        General: Pt is alert, awake, not in acute distress Cardiovascular: RRR, S1/S2 +, no rubs, no gallops Respiratory: CTA bilaterally, no wheezing, no rhonchi Abdominal: Soft, NT, ND, bowel sounds + Extremities: no edema, no cyanosis    The results of significant diagnostics from this hospitalization (including imaging, microbiology, ancillary and laboratory) are listed below for reference.     Microbiology: Recent Results (from the past 240 hour(s))  Culture, blood (Routine x 2)     Status: None (Preliminary result)   Collection Time: 10/19/17  1:30 AM  Result Value Ref Range Status   Specimen Description BLOOD LEFT FOREARM  Final   Special Requests   Final    BOTTLES DRAWN AEROBIC AND ANAEROBIC Blood Culture adequate volume   Culture   Final    NO GROWTH 2 DAYS Performed at [facility], [address], [place], [place] [address]    Report Status PENDING  Incomplete  Culture, blood (Routine x 2)  Status: None (Preliminary result)   Collection Time: 10/19/17  1:35 AM  Result Value Ref Range Status   Specimen Description BLOOD RIGHT HAND  Final   Special Requests   Final    BOTTLES DRAWN AEROBIC AND ANAEROBIC Blood Culture adequate volume   Culture   Final    NO GROWTH 2 DAYS Performed at [facility], [address] [place], [place] [address]    Report Status PENDING  Incomplete  Urine culture     Status: Abnormal   Collection Time: 10/19/17  3:19 AM  Result Value Ref Range Status   Specimen Description URINE, CATHETERIZED  Final   Special Requests NONE  Final   Culture (A)  Final    >=100,000 COLONIES/mL ESCHERICHIA COLI Confirmed Extended Spectrum Beta-Lactamase Producer (ESBL).  In bloodstream infections from ESBL organisms, carbapenems are preferred over piperacillin/tazobactam. They are shown to have a lower risk of mortality. Performed at [facility], [address], [place], [place]  [address]    Report Status 10/21/2017 FINAL  Final   Organism ID, Bacteria ESCHERICHIA COLI (A)  Final      Susceptibility   Escherichia coli - MIC*    AMPICILLIN >=32 RESISTANT Resistant     CEFAZOLIN >=64 RESISTANT Resistant     CEFTRIAXONE RESISTANT Resistant     CIPROFLOXACIN >=4 RESISTANT Resistant     GENTAMICIN <=1 SENSITIVE Sensitive     IMIPENEM <=0.25 SENSITIVE Sensitive     NITROFURANTOIN <=16 SENSITIVE Sensitive     TRIMETH/SULFA <=20 SENSITIVE Sensitive     AMPICILLIN/SULBACTAM 8 SENSITIVE Sensitive     PIP/TAZO <=4 SENSITIVE Sensitive     Extended ESBL POSITIVE Resistant     * >=100,000 COLONIES/mL ESCHERICHIA COLI  MRSA PCR Screening     Status: None   Collection Time: 10/19/17  5:07 AM  Result Value Ref Range Status   MRSA by PCR NEGATIVE NEGATIVE Final    Comment:        The GeneXpert MRSA Assay (FDA approved for NASAL specimens only), is one component of a comprehensive MRSA colonization surveillance program. It is not intended to diagnose MRSA infection nor to guide or monitor treatment for MRSA infections.   Culture, blood (routine x 2)     Status: None (Preliminary result)   Collection Time: 10/19/17  7:03 PM  Result Value Ref Range Status   Specimen Description BLOOD LEFT HAND  Final   Special Requests   Final    BOTTLES DRAWN AEROBIC ONLY Blood Culture adequate volume   Culture   Final    NO GROWTH 1 DAY Performed at [facility], [address] [place], [place] [address]    Report Status PENDING  Incomplete     Labs: BNP (last 3 results) No results for input(s): BNP in the last 8760 hours. Basic Metabolic Panel: Recent Labs  Lab 10/19/17 0130 10/20/17 0356 10/21/17 0310 10/22/17 0539  NA 136 139 140 140  K 3.4* 3.2* 3.3* 4.6  CL 100* 110 107 109  CO2 23 21* 27 26  GLUCOSE 140* 83 97 95  BUN 21* 14 12 16   CREATININE 0.87 0.57 0.49 0.50  CALCIUM 8.5* 7.4* 8.1* 8.5*   Liver Function Tests: Recent Labs  Lab 10/19/17 0130  AST  45*  ALT 18  ALKPHOS 76  BILITOT 1.1  PROT 6.7  ALBUMIN 3.4*   No results for input(s): LIPASE, AMYLASE in the last 168 hours. No results for input(s): AMMONIA in  the last 168 hours. CBC: Recent Labs  Lab 10/19/17 0130 10/20/17 0356 10/21/17 0310 10/22/17 0539  WBC 17.7* 14.1* 10.1 10.4  NEUTROABS 15.0*  --   --   --   HGB 12.1 9.9* 10.8* 10.8*  HCT 36.2 29.9* 32.2* 32.7*  MCV 97.3 96.8 96.1 94.2  PLT 134* 103* 98* 105*   Cardiac Enzymes: Recent Labs  Lab 10/19/17 0158 10/19/17 0633  TROPONINI 1.54* 1.16*   BNP: Invalid input(s): POCBNP CBG: Recent Labs  Lab 10/21/17 0733 10/21/17 1122 10/21/17 1617 10/21/17 2137 10/22/17 0711  GLUCAP 77 125* 164* 140* 82   D-Dimer No results for input(s): DDIMER in the last 72 hours. Hgb A1c No results for input(s): HGBA1C in the last 72 hours. Lipid Profile No results for input(s): CHOL, HDL, LDLCALC, TRIG, CHOLHDL, LDLDIRECT in the last 72 hours. Thyroid function studies No results for input(s): TSH, T4TOTAL, T3FREE, THYROIDAB in the last 72 hours.  Invalid input(s): FREET3 Anemia work up No results for input(s): VITAMINB12, FOLATE, FERRITIN, TIBC, IRON, RETICCTPCT in the last 72 hours. Urinalysis    Component Value Date/Time   COLORURINE AMBER (A) 10/19/2017 0054   APPEARANCEUR CLOUDY (A) 10/19/2017 0054   LABSPEC 1.018 10/19/2017 0054   PHURINE 6.0 10/19/2017 0054   GLUCOSEU NEGATIVE 10/19/2017 0054   HGBUR MODERATE (A) 10/19/2017 0054   BILIRUBINUR NEGATIVE 10/19/2017 0054   KETONESUR 20 (A) 10/19/2017 0054   PROTEINUR 100 (A) 10/19/2017 0054   UROBILINOGEN 0.2 03/21/2015 0236   NITRITE POSITIVE (A) 10/19/2017 0054   LEUKOCYTESUR LARGE (A) 10/19/2017 0054   Sepsis Labs Invalid input(s): PROCALCITONIN,  WBC,  LACTICIDVEN Microbiology Recent Results (from the past 240 hour(s))  Culture, blood (Routine x 2)     Status: None (Preliminary result)   Collection Time: 10/19/17  1:30 AM  Result Value Ref Range  Status   Specimen Description BLOOD LEFT FOREARM  Final   Special Requests   Final    BOTTLES DRAWN AEROBIC AND ANAEROBIC Blood Culture adequate volume   Culture   Final    NO GROWTH 2 DAYS Performed at [facility], [address] [place], [place] [address]    Report Status PENDING  Incomplete  Culture, blood (Routine x 2)     Status: None (Preliminary result)   Collection Time: 10/19/17  1:35 AM  Result Value Ref Range Status   Specimen Description BLOOD RIGHT HAND  Final   Special Requests   Final    BOTTLES DRAWN AEROBIC AND ANAEROBIC Blood Culture adequate volume   Culture   Final    NO GROWTH 2 DAYS Performed at [facility], [address], [place], [place] [address]    Report Status PENDING  Incomplete  Urine culture     Status: Abnormal   Collection Time: 10/19/17  3:19 AM  Result Value Ref Range Status   Specimen Description URINE, CATHETERIZED  Final   Special Requests NONE  Final   Culture (A)  Final    >=100,000 COLONIES/mL ESCHERICHIA COLI Confirmed Extended Spectrum Beta-Lactamase Producer (ESBL).  In bloodstream infections from ESBL organisms, carbapenems are preferred over piperacillin/tazobactam. They are shown to have a lower risk of mortality. Performed at [facility], [address], [place], [place] [address]    Report Status 10/21/2017 FINAL  Final   Organism ID, Bacteria ESCHERICHIA COLI (A)  Final      Susceptibility   Escherichia coli - MIC*    AMPICILLIN >=32 RESISTANT Resistant  CEFAZOLIN >=64 RESISTANT Resistant     CEFTRIAXONE RESISTANT Resistant     CIPROFLOXACIN >=4 RESISTANT Resistant     GENTAMICIN <=1 SENSITIVE Sensitive     IMIPENEM <=0.25 SENSITIVE Sensitive     NITROFURANTOIN <=16 SENSITIVE Sensitive     TRIMETH/SULFA <=20 SENSITIVE Sensitive     AMPICILLIN/SULBACTAM 8 SENSITIVE Sensitive     PIP/TAZO <=4 SENSITIVE Sensitive     Extended ESBL POSITIVE Resistant     * >=100,000 COLONIES/mL ESCHERICHIA COLI  MRSA  PCR Screening     Status: None   Collection Time: 10/19/17  5:07 AM  Result Value Ref Range Status   MRSA by PCR NEGATIVE NEGATIVE Final    Comment:        The GeneXpert MRSA Assay (FDA approved for NASAL specimens only), is one component of a comprehensive MRSA colonization surveillance program. It is not intended to diagnose MRSA infection nor to guide or monitor treatment for MRSA infections.   Culture, blood (routine x 2)     Status: None (Preliminary result)   Collection Time: 10/19/17  7:03 PM  Result Value Ref Range Status   Specimen Description BLOOD LEFT HAND  Final   Special Requests   Final    BOTTLES DRAWN AEROBIC ONLY Blood Culture adequate volume   Culture   Final    NO GROWTH 1 DAY Performed at [facility], [address] [place], [place] [address]    Report Status PENDING  Incomplete     Time coordinating discharge: Over 30 minutes  SIGNED:   Marinda Elk, MD  [facility] 10/22/2017, 10:34 AM Pager   If 7PM-7AM, please contact night-coverage [email or web address] Password [email or web address]

## 2017-10-22 NOTE — Progress Notes (Signed)
Discharge instructions and medications discussed with patient.  AVS given to patient.  All questions answered.  

## 2017-10-22 NOTE — Care Management Note (Signed)
Case Management Note  Patient Details  Name: [name] MRN: [id number] Date of Birth: 11-22-1934  Subjective/Objective:  Per [facility] rep Clydie Braun they are unable to provide [facility] nursing,occupational therapy-MD, dtr Lynden Ang aware & agree to [facility] providing all the other services-HHPT/aide,sw. No further CM needs.                  Action/Plan:d/c home w/HHC   Expected Discharge Date:  10/22/17               Expected Discharge Plan:  Home w Home Health Services  In-House Referral:     Discharge planning Services  CM Consult  Post Acute Care Choice:  Durable Medical Equipment(rollator,rw) Choice offered to:  Adult Children  DME Arranged:    DME Agency:     HH Arranged:  PT, Nurse's Aide, Social Work [employer] Agency:  [facility]  Status of Service:  Completed, signed off  If discussed at [employer], dates discussed:    Additional Comments:  Lanier Clam, RN 10/22/2017, 2:06 PM

## 2017-10-22 NOTE — Care Management Note (Signed)
Case Management Note  Patient Details  Name: BLAIKE NEWBURN MRN: [id number] Date of Birth: December 19, 1934  Subjective/Objective:  CM/CSW spoke to patient about d/c plans. CSW contacted patient's dtr-patient agrees to home w/HHC-[facility] chosen rep Clydie Braun aware of HHC. Patient already has rw,rollator,& raised toilet seat.  No further CM needs.                Action/Plan:d/c home w/HHC.   Expected Discharge Date:  10/22/17               Expected Discharge Plan:  Home w Home Health Services  In-House Referral:     Discharge planning Services  CM Consult  Post Acute Care Choice:  Durable Medical Equipment(rollator,rw) Choice offered to:  Adult Children  DME Arranged:    DME Agency:     HH Arranged:  RN, PT, OT, Nurse's Aide, Social Work [employer] Agency:  [facility]  Status of Service:  Completed, signed off  If discussed at [employer], dates discussed:    Additional Comments:  Lanier Clam, RN 10/22/2017, 1:41 PM

## 2017-10-22 NOTE — Progress Notes (Signed)
LCSW following for SNF placement.  LCSW met with patient at bedside. Patient was apprehensive about SNF placement. LCSW explained needing prior insurance authorization. Patient gave permission LCSW speak with her daughter, [name].  LCSW spoke with patient's daughter by phone. LCSW discussed PT recommendations and insurance auth for SNF.   Daughter,[name] explained that patient is essentially at her baseline and always needs help to get up. [name] reports that patient has rheumatoid arthritis. She reports that patient has more difficulty when she is weak.   CSW Surveyor, quantity is not willing to do an LOG.  Daughter, [name] reports that family is able to private pay for SNF until [facility] is approved. Daughter asked about likelihood of auth being approved. LCSW explained to daughter that we can not guarantee that [facility] will be approved. Patient ambulating 140 with rolling walker and has chronic rheumatoid arthritis. Daughter stated that she could provided additional support in the home if needed.   Patient and family prefers home health vs SNF. Patients daughter reports that patient has used [facility] in the past.   LCSW notified MD and RNCM.   Carolin Coy Byron Long [place]

## 2017-10-24 LAB — CULTURE, BLOOD (ROUTINE X 2)
CULTURE: NO GROWTH
Culture: NO GROWTH
SPECIAL REQUESTS: ADEQUATE
Special Requests: ADEQUATE

## 2017-10-25 LAB — CULTURE, BLOOD (ROUTINE X 2)
CULTURE: NO GROWTH
Special Requests: ADEQUATE

## 2017-10-30 ENCOUNTER — Other Ambulatory Visit: Payer: Self-pay

## 2017-10-30 ENCOUNTER — Encounter (HOSPITAL_COMMUNITY): Payer: Self-pay | Admitting: Emergency Medicine

## 2017-10-30 ENCOUNTER — Emergency Department (HOSPITAL_COMMUNITY): Payer: Medicare Other

## 2017-10-30 ENCOUNTER — Inpatient Hospital Stay (HOSPITAL_COMMUNITY)
Admission: EM | Admit: 2017-10-30 | Discharge: 2017-11-03 | DRG: 871 | Disposition: A | Discharge status: Skilled Nursing Facility | Payer: Medicare Other | Attending: Family Medicine | Admitting: Family Medicine

## 2017-10-30 DIAGNOSIS — Z79899 Other long term (current) drug therapy: Secondary | ICD-10-CM

## 2017-10-30 DIAGNOSIS — M069 Rheumatoid arthritis, unspecified: Secondary | ICD-10-CM | POA: Diagnosis present

## 2017-10-30 DIAGNOSIS — R17 Unspecified jaundice: Secondary | ICD-10-CM | POA: Diagnosis present

## 2017-10-30 DIAGNOSIS — A419 Sepsis, unspecified organism: Secondary | ICD-10-CM | POA: Diagnosis present

## 2017-10-30 DIAGNOSIS — Z681 Body mass index (BMI) 19 or less, adult: Secondary | ICD-10-CM | POA: Diagnosis not present

## 2017-10-30 DIAGNOSIS — S2242XA Multiple fractures of ribs, left side, initial encounter for closed fracture: Secondary | ICD-10-CM | POA: Diagnosis present

## 2017-10-30 DIAGNOSIS — E2740 Unspecified adrenocortical insufficiency: Secondary | ICD-10-CM | POA: Diagnosis present

## 2017-10-30 DIAGNOSIS — I10 Essential (primary) hypertension: Secondary | ICD-10-CM | POA: Diagnosis present

## 2017-10-30 DIAGNOSIS — E440 Moderate protein-calorie malnutrition: Secondary | ICD-10-CM | POA: Diagnosis present

## 2017-10-30 DIAGNOSIS — M810 Age-related osteoporosis without current pathological fracture: Secondary | ICD-10-CM | POA: Diagnosis present

## 2017-10-30 DIAGNOSIS — W19XXXA Unspecified fall, initial encounter: Secondary | ICD-10-CM | POA: Diagnosis present

## 2017-10-30 DIAGNOSIS — Z7952 Long term (current) use of systemic steroids: Secondary | ICD-10-CM | POA: Diagnosis not present

## 2017-10-30 DIAGNOSIS — Z952 Presence of prosthetic heart valve: Secondary | ICD-10-CM | POA: Diagnosis not present

## 2017-10-30 DIAGNOSIS — J189 Pneumonia, unspecified organism: Secondary | ICD-10-CM | POA: Diagnosis present

## 2017-10-30 DIAGNOSIS — E1151 Type 2 diabetes mellitus with diabetic peripheral angiopathy without gangrene: Secondary | ICD-10-CM | POA: Diagnosis present

## 2017-10-30 DIAGNOSIS — F0390 Unspecified dementia without behavioral disturbance: Secondary | ICD-10-CM | POA: Diagnosis present

## 2017-10-30 DIAGNOSIS — R296 Repeated falls: Secondary | ICD-10-CM | POA: Diagnosis present

## 2017-10-30 DIAGNOSIS — Z90710 Acquired absence of both cervix and uterus: Secondary | ICD-10-CM

## 2017-10-30 DIAGNOSIS — H9190 Unspecified hearing loss, unspecified ear: Secondary | ICD-10-CM | POA: Diagnosis present

## 2017-10-30 DIAGNOSIS — I951 Orthostatic hypotension: Secondary | ICD-10-CM | POA: Diagnosis present

## 2017-10-30 DIAGNOSIS — E273 Drug-induced adrenocortical insufficiency: Secondary | ICD-10-CM | POA: Diagnosis present

## 2017-10-30 DIAGNOSIS — F329 Major depressive disorder, single episode, unspecified: Secondary | ICD-10-CM | POA: Diagnosis present

## 2017-10-30 DIAGNOSIS — J8410 Pulmonary fibrosis, unspecified: Secondary | ICD-10-CM | POA: Diagnosis present

## 2017-10-30 DIAGNOSIS — J9811 Atelectasis: Secondary | ICD-10-CM | POA: Diagnosis present

## 2017-10-30 DIAGNOSIS — Y95 Nosocomial condition: Secondary | ICD-10-CM | POA: Diagnosis present

## 2017-10-30 DIAGNOSIS — F419 Anxiety disorder, unspecified: Secondary | ICD-10-CM | POA: Diagnosis present

## 2017-10-30 DIAGNOSIS — E119 Type 2 diabetes mellitus without complications: Secondary | ICD-10-CM

## 2017-10-30 DIAGNOSIS — R531 Weakness: Secondary | ICD-10-CM | POA: Diagnosis present

## 2017-10-30 LAB — BASIC METABOLIC PANEL
ANION GAP: 15 (ref 5–15)
BUN: 25 mg/dL — ABNORMAL HIGH (ref 6–20)
CO2: 19 mmol/L — AB (ref 22–32)
Calcium: 8.8 mg/dL — ABNORMAL LOW (ref 8.9–10.3)
Chloride: 98 mmol/L — ABNORMAL LOW (ref 101–111)
Creatinine, Ser: 1.31 mg/dL — ABNORMAL HIGH (ref 0.44–1.00)
GFR, EST AFRICAN AMERICAN: 43 mL/min — AB (ref >60)
GFR, EST NON AFRICAN AMERICAN: 37 mL/min — AB (ref >60)
Glucose, Bld: 80 mg/dL (ref 65–99)
Potassium: 4.6 mmol/L (ref 3.5–5.1)
SODIUM: 132 mmol/L — AB (ref 135–145)

## 2017-10-30 LAB — CBC
HCT: 38.5 % (ref 36.0–46.0)
HEMOGLOBIN: 12.7 g/dL (ref 12.0–15.0)
MCH: 31.3 pg (ref 26.0–34.0)
MCHC: 33 g/dL (ref 30.0–36.0)
MCV: 94.8 fL (ref 78.0–100.0)
Platelets: 167 10*3/uL (ref 150–400)
RBC: 4.06 MIL/uL (ref 3.87–5.11)
RDW: 14.4 % (ref 11.5–15.5)
WBC: 19.4 10*3/uL — AB (ref 4.0–10.5)

## 2017-10-30 LAB — I-STAT CHEM 8, ED
BUN: 24 mg/dL — AB (ref 6–20)
CREATININE: 1.2 mg/dL — AB (ref 0.44–1.00)
Calcium, Ion: 0.79 mmol/L — CL (ref 1.15–1.40)
Chloride: 100 mmol/L — ABNORMAL LOW (ref 101–111)
GLUCOSE: 78 mg/dL (ref 65–99)
HCT: 40 % (ref 36.0–46.0)
Hemoglobin: 13.6 g/dL (ref 12.0–15.0)
POTASSIUM: 4.5 mmol/L (ref 3.5–5.1)
Sodium: 133 mmol/L — ABNORMAL LOW (ref 135–145)
TCO2: 20 mmol/L — ABNORMAL LOW (ref 22–32)

## 2017-10-30 LAB — URINALYSIS, ROUTINE W REFLEX MICROSCOPIC
BILIRUBIN URINE: NEGATIVE
Bacteria, UA: NONE SEEN
GLUCOSE, UA: NEGATIVE mg/dL
KETONES UR: 80 mg/dL — AB
NITRITE: NEGATIVE
Protein, ur: 100 mg/dL — AB
SPECIFIC GRAVITY, URINE: 1.016 (ref 1.005–1.030)
pH: 6 (ref 5.0–8.0)

## 2017-10-30 LAB — I-STAT CG4 LACTIC ACID, ED
Lactic Acid, Venous: 1.37 mmol/L (ref 0.5–1.9)
Lactic Acid, Venous: 0.91 mmol/L (ref 0.5–1.9)

## 2017-10-30 LAB — CBG MONITORING, ED
Glucose-Capillary: 82 mg/dL (ref 65–99)
Glucose-Capillary: 57 mg/dL — ABNORMAL LOW (ref 65–99)

## 2017-10-30 LAB — HEPATIC FUNCTION PANEL
ALK PHOS: 75 U/L (ref 38–126)
ALT: 15 U/L (ref 14–54)
AST: 18 U/L (ref 15–41)
Albumin: 3.3 g/dL — ABNORMAL LOW (ref 3.5–5.0)
BILIRUBIN DIRECT: 0.6 mg/dL — AB (ref 0.1–0.5)
Indirect Bilirubin: 1.5 mg/dL — ABNORMAL HIGH (ref 0.3–0.9)
TOTAL PROTEIN: 6.5 g/dL (ref 6.5–8.1)
Total Bilirubin: 2.1 mg/dL — ABNORMAL HIGH (ref 0.3–1.2)

## 2017-10-30 MED ORDER — FENTANYL CITRATE (PF) 100 MCG/2ML IJ SOLN
50.0000 ug | Freq: Once | INTRAMUSCULAR | Status: AC
  Administered 2017-10-30: 50 ug via INTRAVENOUS
  Filled 2017-10-30: qty 2

## 2017-10-30 MED ORDER — BUPROPION HCL 100 MG PO TABS
100.0000 mg | ORAL_TABLET | Freq: Every morning | ORAL | Status: DC
  Administered 2017-10-31 – 2017-11-03 (×4): 100 mg via ORAL
  Filled 2017-10-30 (×4): qty 1

## 2017-10-30 MED ORDER — SODIUM CHLORIDE 0.9 % IV SOLN
1.0000 g | Freq: Two times a day (BID) | INTRAVENOUS | Status: DC
  Administered 2017-10-31 – 2017-11-01 (×3): 1 g via INTRAVENOUS
  Filled 2017-10-30 (×4): qty 1

## 2017-10-30 MED ORDER — ALPRAZOLAM 0.5 MG PO TABS
0.5000 mg | ORAL_TABLET | Freq: Every day | ORAL | Status: DC
  Administered 2017-10-31 – 2017-11-02 (×4): 0.5 mg via ORAL
  Filled 2017-10-30 (×4): qty 1

## 2017-10-30 MED ORDER — SODIUM CHLORIDE 0.9 % IV BOLUS (SEPSIS)
1000.0000 mL | Freq: Once | INTRAVENOUS | Status: AC
  Administered 2017-10-30: 1000 mL via INTRAVENOUS

## 2017-10-30 MED ORDER — VANCOMYCIN HCL 500 MG IV SOLR: 500.0000 mg | INTRAVENOUS | Status: DC

## 2017-10-30 MED ORDER — SODIUM CHLORIDE 0.9 % IV SOLN
1.0000 g | INTRAVENOUS | Status: AC
  Administered 2017-10-30: 1 g via INTRAVENOUS
  Filled 2017-10-30: qty 1

## 2017-10-30 MED ORDER — ENOXAPARIN SODIUM 40 MG/0.4ML ~~LOC~~ SOLN: 40.0000 mg | SUBCUTANEOUS | Status: DC

## 2017-10-30 MED ORDER — NORTRIPTYLINE HCL 25 MG PO CAPS
75.0000 mg | ORAL_CAPSULE | Freq: Every day | ORAL | Status: DC
  Administered 2017-10-31 – 2017-11-02 (×3): 75 mg via ORAL
  Filled 2017-10-30 (×4): qty 3

## 2017-10-30 MED ORDER — BISOPROLOL FUMARATE 5 MG PO TABS
2.5000 mg | ORAL_TABLET | Freq: Every day | ORAL | Status: DC
  Administered 2017-10-31 – 2017-11-03 (×4): 2.5 mg via ORAL
  Filled 2017-10-30 (×4): qty 1

## 2017-10-30 MED ORDER — VITAMIN B-12 1000 MCG PO TABS
1000.0000 ug | ORAL_TABLET | Freq: Every day | ORAL | Status: DC
  Administered 2017-10-31 – 2017-11-03 (×4): 1000 ug via ORAL
  Filled 2017-10-30 (×4): qty 1

## 2017-10-30 MED ORDER — MIRTAZAPINE 15 MG PO TABS
30.0000 mg | ORAL_TABLET | Freq: Every day | ORAL | Status: DC
  Administered 2017-10-31 – 2017-11-02 (×3): 30 mg via ORAL
  Filled 2017-10-30 (×3): qty 2

## 2017-10-30 MED ORDER — PREDNISONE 10 MG PO TABS
15.0000 mg | ORAL_TABLET | Freq: Every day | ORAL | Status: DC
  Administered 2017-10-31 – 2017-11-01 (×2): 15 mg via ORAL
  Filled 2017-10-30 (×3): qty 1

## 2017-10-30 MED ORDER — INSULIN ASPART 100 UNIT/ML ~~LOC~~ SOLN
0.0000 [IU] | Freq: Three times a day (TID) | SUBCUTANEOUS | Status: DC
  Administered 2017-10-31: 2 [IU] via SUBCUTANEOUS
  Administered 2017-10-31: 1 [IU] via SUBCUTANEOUS
  Administered 2017-11-01: 2 [IU] via SUBCUTANEOUS

## 2017-10-30 MED ORDER — LATANOPROST 0.005 % OP SOLN
1.0000 [drp] | Freq: Every day | OPHTHALMIC | Status: DC
  Administered 2017-10-31 – 2017-11-02 (×3): 1 [drp] via OPHTHALMIC
  Filled 2017-10-30: qty 2.5

## 2017-10-30 MED ORDER — VANCOMYCIN HCL IN DEXTROSE 1-5 GM/200ML-% IV SOLN
1000.0000 mg | INTRAVENOUS | Status: AC
Start: 2017-10-30 — End: 2017-10-30
  Administered 2017-10-30: 1000 mg via INTRAVENOUS
  Filled 2017-10-30: qty 200

## 2017-10-30 MED ORDER — HYDROCODONE-ACETAMINOPHEN 5-325 MG PO TABS
1.0000 | ORAL_TABLET | Freq: Four times a day (QID) | ORAL | Status: DC | PRN
  Administered 2017-10-31 – 2017-11-02 (×6): 1 via ORAL
  Filled 2017-10-30 (×7): qty 1

## 2017-10-30 NOTE — Progress Notes (Signed)
A consult was received from an ED physician for vancomycin and meropenem per pharmacy dosing.  The patient's profile has been reviewed for ht/wt/allergies/indication/available labs.   History of hives to PCN but has tolerated carbapenem and cephalosporin therapy in past One time orders have been placed for: - vancomycin 1gm IV x 1 - meropenem 1gm IV x 1.   Further antibiotics/pharmacy consults should be ordered by admitting physician if indicated.                       Thank you, Dannielle Huh 10/30/2017  6:44 PM

## 2017-10-30 NOTE — ED Provider Notes (Signed)
[facility]  [facility] Provider Note   CSN: [id number] Arrival date & time: 10/30/17  1748     History   Chief Complaint Chief Complaint  Patient presents with  . Near Syncope    HPI [name] is a 83 y.o. female.  HPI   83 year old female with extensive past medical history as below including rheumatoid arthritis, pulmonary fibrosis, adrenal insufficiency, status post recent admission for ESBL UTI who presents with generalized weakness.  The patient has been home for the last week.  According to her report as well as her granddaughters report, the patient has been increasingly weak at home.  She has fallen at least 3 times in the last week.  She states that every time she tries to stand up, she becomes very dizzy and weak and has to sit down.  She does admit to falling and hitting her head several times.  She has had associated productive, occasionally painful cough with sputum production.  She endorses associated shortness of breath.  She has had associated decreased appetite and has been eating little to no food for the last several days.  She denies any abdominal pain or urinary symptoms.  Denies any known fevers but has had chills.  Family does note that she has been mildly more confused than usual as well.  She denies any current chest pain.  Past Medical History:  Diagnosis Date  . Adrenal insufficiency, primary, iatrogenic   . Anxiety   . Aortic stenosis, severe 02/26/2013  . Chest pain    myoview 04/13/04-inferoapical thinning with a suggestion of mild apical ischemia, EF 66%  . Depression 03/25/2013  . Gait instability   . Hearing loss 03/25/2013  . Memory deficit 03/25/2013  . Murmur, cardiac   . Osteoporosis   . Palpitations   . Pancreatic insufficiency 11/09/2012  . Physical deconditioning   . Pneumonia   . Pulmonary fibrosis (HCC) 03/25/2013  . Rheumatoid arthritis(714.0)   . SOBOE (shortness of breath on exertion)    echo 02/23/13-EF  55-60%, possible bicuspid aortic valve with mod to severed stenosis   . Syncope   . UTI (lower urinary tract infection) 10/01/2011    Patient Active Problem List   Diagnosis Date Noted  . HCAP (healthcare-associated pneumonia) 10/30/2017  . Fracture of three ribs, left, closed, initial encounter 10/30/2017  . Sepsis associated hypotension (HCC) 10/19/2017  . Elevated troponin 10/19/2017  . Dizziness 07/24/2016  . Dyspnea 12/06/2015  . Hypoxia 12/06/2015  . Hypokalemia 10/21/2015  . Hypophosphatemia 10/21/2015  . Hypomagnesemia 10/21/2015  . CAP (community acquired pneumonia) 10/20/2015  . B12 deficiency   . Malnutrition of moderate degree (HCC) 03/18/2015  . Altered mental state 03/17/2015  . Metabolic encephalopathy 09/12/2014    Class: Acute  . Sepsis secondary to UTI (HCC) 09/09/2014  . S/P TAVR (transcatheter aortic valve replacement) 03/16/2014  . Left rib fracture 11/17/2013  . Closed pelvic fracture (HCC) 11/17/2013  . Sepsis (HCC) 11/10/2013  . UTI (lower urinary tract infection) 11/09/2013  . Sepsis syndrome 09/17/2013  . COPD (chronic obstructive pulmonary disease) (HCC) 07/20/2013  . Chest pain 06/29/2013  . Hearing loss 03/25/2013  . Memory deficit 03/25/2013  . Depression 03/25/2013  . Syncope 03/25/2013  . Pulmonary fibrosis (HCC)   . Aortic stenosis, severe 02/26/2013  . Pancreatic insufficiency (HCC) 11/09/2012    Class: Chronic  . Low back pain 01/16/2012    Class: Acute  . Gait instability 01/16/2012    Class: Acute  .  Sinusitis, acute, sphenoidal 11/17/2011    Class: Acute  . Pneumonia 11/12/2011    Class: Acute  . Chronic adrenal insufficiency (HCC) 11/12/2011    Class: Chronic  . E. coli UTI (urinary tract infection) 10/01/2011  . Fever 10/01/2011  . Cough 10/01/2011  . Failure to thrive in adult 10/01/2011  . Weakness generalized 10/01/2011  . Rheumatoid arthritis involving multiple joints (HCC) 10/01/2011  . DM2 (diabetes mellitus, type  2) (HCC) 10/01/2011    Past Surgical History:  Procedure Laterality Date  . ABDOMINAL HYSTERECTOMY    . CARDIAC CATHETERIZATION  11/08/96   no evidence of CAD, nl LV function  . CHOLECYSTECTOMY    . CORONARY ANGIOGRAM  03/22/2013   Procedure: CORONARY ANGIOGRAM;  Surgeon: [name], MD;  Location: [facility] CATH LAB;  Service: Cardiovascular;;  . KNEE ARTHROSCOPY     2 on left  . RIGHT HEART CATHETERIZATION  03/22/2013   Procedure: RIGHT HEART CATH;  Surgeon: [name], MD;  Location: [facility] CATH LAB;  Service: Cardiovascular;;  . SHOULDER SURGERY Left   . THYROIDECTOMY      OB History    No data available       Home Medications    Prior to Admission medications   Medication Sig Start Date End Date Taking? Authorizing Provider  ALPRAZolam Prudy Feeler) 1 MG tablet Take 0.5 mg by mouth at bedtime. Anxiety  10/08/15  Yes [provider]  bisoprolol (ZEBETA) 5 MG tablet Take 0.5 tablets (2.5 mg total) by mouth daily. 07/24/16  Yes Hilty, Lisette Abu, MD  buPROPion (WELLBUTRIN) 100 MG tablet Take 100 mg by mouth every morning. 10/06/15  Yes [provider]  leflunomide (ARAVA) 20 MG tablet Take 20 mg by mouth daily.   Yes [provider]  mirtazapine (REMERON) 30 MG tablet Take 30 mg by mouth at bedtime.    Yes [provider]  nortriptyline (PAMELOR) 75 MG capsule Take 75 mg by mouth at bedtime.   Yes [provider]  predniSONE (DELTASONE) 5 MG tablet Take 1.5 tablets (7.5 mg total) by mouth daily with breakfast. Resume in 4days 10/27/15  Yes Zannie Cove, MD  TRAVATAN Z 0.004 % SOLN ophthalmic solution Place 1 drop into both eyes at bedtime. 09/16/15  Yes [provider]  vitamin B-12 (CYANOCOBALAMIN) 1000 MCG tablet Take 1 tablet (1,000 mcg total) by mouth daily. 03/22/15  Yes Osvaldo Shipper, MD    Family History Family History  Problem Relation Age of Onset  . Cancer - Colon Father   . Stroke Brother   . Cancer Sister         spine?    Social History Social History   Tobacco Use  . Smoking status: Never Smoker  . Smokeless tobacco: Never Used  Substance Use Topics  . Alcohol use: No  . Drug use: No     Allergies   Adhesive [tape] and Penicillins   Review of Systems Review of Systems  Constitutional: Positive for chills and fatigue.  Respiratory: Positive for cough, shortness of breath and wheezing.   Neurological: Positive for weakness.  All other systems reviewed and are negative.    Physical Exam Updated Vital Signs BP (!) 113/53   Pulse (!) 103   Temp (!) 97.3 F (36.3 C) (Oral)   Resp 14   Ht 5\' 3"  (1.6 m)   Wt 53.1 kg (117 lb)   SpO2 96%   BMI 20.73 kg/m   Physical Exam  Constitutional: She is  oriented to person, place, and time. She appears well-developed and well-nourished. She appears ill. No distress.  HENT:  Head: Normocephalic and atraumatic.  Dry mucous membranes  Eyes: Conjunctivae are normal.  Neck: Neck supple.  Cardiovascular: Regular rhythm and normal heart sounds. Tachycardia present. Exam reveals no friction rub.  No murmur heard. Pulmonary/Chest: Effort normal. No respiratory distress. She has decreased breath sounds in the right middle field, the right lower field, the left middle field and the left lower field. She has no wheezes. She has no rales.  Abdominal: She exhibits no distension.  Musculoskeletal: She exhibits no edema.  Neurological: She is alert and oriented to person, place, and time. She exhibits normal muscle tone.  Skin: Skin is warm. Capillary refill takes less than 2 seconds.  Psychiatric: She has a normal mood and affect.  Nursing note and vitals reviewed.    ED Treatments / Results  Labs (all labs ordered are listed, but only abnormal results are displayed) Labs Reviewed  BASIC METABOLIC PANEL - Abnormal; Notable for the following components:      Result Value   Sodium 132 (*)    Chloride 98 (*)    CO2 19 (*)    BUN 25 (*)     Creatinine, Ser 1.31 (*)    Calcium 8.8 (*)    GFR calc non Af Amer 37 (*)    GFR calc Af Amer 43 (*)    All other components within normal limits  CBC - Abnormal; Notable for the following components:   WBC 19.4 (*)    All other components within normal limits  URINALYSIS, ROUTINE W REFLEX MICROSCOPIC - Abnormal; Notable for the following components:   Hgb urine dipstick MODERATE (*)    Ketones, ur 80 (*)    Protein, ur 100 (*)    Leukocytes, UA SMALL (*)    Squamous Epithelial / LPF 0-5 (*)    All other components within normal limits  HEPATIC FUNCTION PANEL - Abnormal; Notable for the following components:   Albumin 3.3 (*)    Total Bilirubin 2.1 (*)    Bilirubin, Direct 0.6 (*)    Indirect Bilirubin 1.5 (*)    All other components within normal limits  I-STAT CHEM 8, ED - Abnormal; Notable for the following components:   Sodium 133 (*)    Chloride 100 (*)    BUN 24 (*)    Creatinine, Ser 1.20 (*)    Calcium, Ion 0.79 (*)    TCO2 20 (*)    All other components within normal limits  CBG MONITORING, ED - Abnormal; Notable for the following components:   Glucose-Capillary 57 (*)    All other components within normal limits  CULTURE, BLOOD (ROUTINE X 2)  CULTURE, BLOOD (ROUTINE X 2)  URINE CULTURE  CULTURE, EXPECTORATED SPUTUM-ASSESSMENT  GRAM STAIN  CREATININE, SERUM  HIV ANTIBODY (ROUTINE TESTING)  STREP PNEUMONIAE URINARY ANTIGEN  CBG MONITORING, ED  I-STAT CG4 LACTIC ACID, ED  I-STAT CG4 LACTIC ACID, ED    EKG  EKG Interpretation  Date/Time:  Thursday October 30 2017 18:02:40 EST Ventricular Rate:  118 PR Interval:    QRS Duration: 125 QT Interval:  304 QTC Calculation: 426 R Axis:   2 Text Interpretation:  Sinus tachycardia LVH with IVCD and secondary repol abnrm No significant change since last tracing Confirmed by Shaune Pollack [id number]) on 10/30/2017 6:06:45 PM       Radiology Dg Chest 2 View  Result Date: 10/30/2017 CLINICAL DATA:  Status post  possible fall with injury to the ribs. EXAM: CHEST  2 VIEW COMPARISON:  10/19/2017 FINDINGS: Prior sending aortic stent graft repair. Cardiomediastinal silhouette is normal. Mediastinal contours appear intact. Calcific atherosclerotic disease of the aorta. Stable elevation of the left hemidiaphragm. There is no evidence of focal airspace consolidation, pleural effusion or pneumothorax. Osseous structures are without acute abnormality. Thoracic kyphosis with stable anterior compression deformity of T6 vertebral body. Minimally displaced left fourth posterior rib fracture. Soft tissues are grossly normal. IMPRESSION: Minimally displaced left 4th posterior rib fracture. No evidence of pneumothorax. Electronically Signed   By: Ted Mcalpine M.D.   On: 10/30/2017 20:01   Ct Head Wo Contrast  Result Date: 10/30/2017 CLINICAL DATA:  Status post fall. EXAM: CT HEAD WITHOUT CONTRAST TECHNIQUE: Contiguous axial images were obtained from the base of the skull through the vertex without intravenous contrast. COMPARISON:  10/14/2015 FINDINGS: Brain: No evidence of acute infarction, hemorrhage, hydrocephalus, extra-axial collection or mass lesion/mass effect. Moderate brain parenchymal volume loss and deep white matter microangiopathy. Chronic lacunar infarct of the right thalamus. Vascular: Atherosclerotic calcifications at the intra cavernous carotid arteries. Skull: Normal. Negative for fracture or focal lesion. Sinuses/Orbits: No acute finding. Other: None. IMPRESSION: No evidence of acute traumatic injury to head. Moderate brain parenchymal atrophy and chronic microvascular disease. Chronic right thalamic lacunar infarct. Electronically Signed   By: Ted Mcalpine M.D.   On: 10/30/2017 20:41   Ct Chest Wo Contrast  Result Date: 10/30/2017 CLINICAL DATA:  Fall with rib pain. EXAM: CT CHEST WITHOUT CONTRAST TECHNIQUE: Multidetector CT imaging of the chest was performed following the standard protocol without  IV contrast. COMPARISON:  05/27/2015 FINDINGS: Cardiovascular: Heart size upper normal to mildly enlarged. No pericardial effusion. Patient is status post aortic valve replacement. Coronary artery calcification is evident. Atherosclerotic calcification is noted in the wall of the thoracic aorta. Prominence of the main pulmonary artery raises the question of pulmonary arterial hypertension. Mediastinum/Nodes: No mediastinal lymphadenopathy. No evidence for gross hilar lymphadenopathy although assessment is limited by the lack of intravenous contrast on today's study. Lungs/Pleura: Evaluation of lung parenchyma is obscured scratchy at fine detail of lung parenchyma is obscured by patient breathing motion during image acquisition. Probable emphysema with some interlobular septal thickening in the lung apices. Patchy atelectasis or pneumonia noted posterior left lower lobe. No pneumothorax. No pleural effusion. Upper Abdomen: Unremarkable. Musculoskeletal: Multiple left-sided rib fractures of varying age are identified. Anterior old fractures of lower left ribs evident. Fractures of the posterior tenth, eleventh, and twelfth ribs on the left are nonacute as there is evidence of bony callus, but these are not yet healed. Old anterior right rib fractures identified in the lower ribs but incompletely visualized. IMPRESSION: 1. Posterior fractures of the left tenth, eleventh, and twelfth ribs are at least subacute as bony callus is evident but the fracture lines are still visible consistent with incomplete healing. 2. Old healed fractures are identified in the anterior lower ribs bilaterally. 3. No pneumothorax or pleural effusion. 4. Mild patchy airspace opacity posterior left lower lobe compatible with atelectasis or pneumonia. 5.  Aortic Atherosclerois (ICD10-170.0) 6. Question pulmonary arterial hypertension. Electronically Signed   By: Kennith Center M.D.   On: 10/30/2017 20:44    Procedures .Critical  Care Performed by: Shaune Pollack, MD Authorized by: Shaune Pollack, MD   Critical care provider statement:    Critical care time (minutes):  35   Critical care time was exclusive of:  Separately billable procedures and  treating other patients and teaching time   Critical care was necessary to treat or prevent imminent or life-threatening deterioration of the following conditions:  Circulatory failure, cardiac failure and sepsis   Critical care was time spent personally by me on the following activities:  Development of treatment plan with patient or surrogate, discussions with consultants, evaluation of patient's response to treatment, examination of patient, obtaining history from patient or surrogate, ordering and performing treatments and interventions, ordering and review of laboratory studies, ordering and review of radiographic studies, pulse oximetry, re-evaluation of patient's condition and review of old charts   I assumed direction of critical care for this patient from another provider in my specialty: no     (including critical care time)  Medications Ordered in ED Medications  ALPRAZolam (XANAX) tablet 0.5 mg (not administered)  bisoprolol (ZEBETA) tablet 2.5 mg (not administered)  buPROPion (WELLBUTRIN) tablet 100 mg (not administered)  mirtazapine (REMERON) tablet 30 mg (not administered)  nortriptyline (PAMELOR) capsule 75 mg (not administered)  vitamin B-12 (CYANOCOBALAMIN) tablet 1,000 mcg (not administered)  latanoprost (XALATAN) 0.005 % ophthalmic solution 1 drop (not administered)  predniSONE (DELTASONE) tablet 15 mg (not administered)  insulin aspart (novoLOG) injection 0-9 Units (not administered)  vancomycin (VANCOCIN) 500 mg in sodium chloride 0.9 % 100 mL IVPB (not administered)  meropenem (MERREM) 1 g in sodium chloride 0.9 % 100 mL IVPB (not administered)  HYDROcodone-acetaminophen (NORCO/VICODIN) 5-325 MG per tablet 1 tablet (not administered)  enoxaparin  (LOVENOX) injection 40 mg (not administered)  sodium chloride 0.9 % bolus 1,000 mL (0 mLs Intravenous Stopped 10/30/17 2257)  sodium chloride 0.9 % bolus 1,000 mL (0 mLs Intravenous Stopped 10/30/17 2011)  vancomycin (VANCOCIN) IVPB 1000 mg/200 mL premix (0 mg Intravenous Stopped 10/30/17 2012)  meropenem (MERREM) 1 g in sodium chloride 0.9 % 100 mL IVPB (1 g Intravenous Restarted 10/30/17 1933)  fentaNYL (SUBLIMAZE) injection 50 mcg (50 mcg Intravenous Given 10/30/17 2133)     Initial Impression / Assessment and Plan / ED Course  I have reviewed the triage vital signs and the nursing notes.  Pertinent labs & imaging results that were available during my care of the patient were reviewed by me and considered in my medical decision making (see chart for details).     83 year old female with complex past medical history including recent admission for ESBL UTI here with generalized weakness and recurring falls.  On arrival, the patient is tachycardic and hypothermic.  She appears ill but nontoxic.  Concern for recurrent sepsis versus generalized weakening and deconditioning with dehydration and orthostasis.  Given her hypothermia and tachycardia, will activate code sepsis with broad-spectrum antibiotics including meropenem based on previous sensitivities.  Lactic acid normal which is reassuring.  Will obtain chest x-ray as well as CT head given her recent falls.  Patient will likely need to be readmitted.  Labs, imaging is c/w sepsis, dehydration. Likely source is urine but XR c/f PNA, so CT obtained and shows subacute rib fx, PNA. Based on history, I suspect pt had falls at home with fx sometime since discharge, and now possibly superimposed PNA 2/2 pain and splinting. Must also consider ongoing UTI 2/2 ESBL UTI. Will continue empiric abx, fluids, admit.  Final Clinical Impressions(s) / ED Diagnoses   Final diagnoses:  Sepsis, due to unspecified organism (HCC)  HCAP (healthcare-associated pneumonia)     ED Discharge Orders    None       Shaune Pollack, MD 10/31/17 0159

## 2017-10-30 NOTE — ED Notes (Signed)
Bed: OE70 Expected date:  Expected time:  Means of arrival:  Comments: TR 2 per Erma Heritage MD

## 2017-10-30 NOTE — H&P (Signed)
History and Physical    PARTICIA [name] LKG:[id number] DOB: 27-Jul-1935 DOA: 10/30/2017  PCP: Jarome Matin, MD  Patient coming from: Home  I have personally briefly reviewed patient's old medical records in [facility]  Chief Complaint: Near syncope  HPI: [name] is a 83 y.o. female with medical history significant of severe AS s/p TAVR, RA on chronic steroids, iatrogenic adrenal insufficiency, memory deficit (likely early dementia), pancreatic insufficiency, prior ESBL e.coli UTIs.  Patient was actually admitted for ESBL e.coli UTI on 12/30-1/2, got 3 days of merrem, switched to bactrim and discharged.  Since discharge has been weak generally at home.  Had at least 3 falls in the past week.  Becomes very dizzy every time she tries to stand up.  Has also developed an associated productive, painful cough.  Decreased appetite.  No known fever but does have chills.   ED Course: LLL PNA and L 10th-12th rib Fx that appear sub-acute on CT scan.  WBC 19k, UA small leuks, 6-30 WBC.   Review of Systems: As per HPI otherwise 10 point review of systems negative.   Past Medical History:  Diagnosis Date  . Adrenal insufficiency, primary, iatrogenic   . Anxiety   . Aortic stenosis, severe 02/26/2013  . Chest pain    myoview 04/13/04-inferoapical thinning with a suggestion of mild apical ischemia, EF 66%  . Depression 03/25/2013  . Gait instability   . Hearing loss 03/25/2013  . Memory deficit 03/25/2013  . Murmur, cardiac   . Osteoporosis   . Palpitations   . Pancreatic insufficiency 11/09/2012  . Physical deconditioning   . Pneumonia   . Pulmonary fibrosis (HCC) 03/25/2013  . Rheumatoid arthritis(714.0)   . SOBOE (shortness of breath on exertion)    echo 02/23/13-EF 55-60%, possible bicuspid aortic valve with mod to severed stenosis   . Syncope   . UTI (lower urinary tract infection) 10/01/2011    Past Surgical History:  Procedure Laterality Date  . ABDOMINAL HYSTERECTOMY    .  CARDIAC CATHETERIZATION  11/08/96   no evidence of CAD, nl LV function  . CHOLECYSTECTOMY    . CORONARY ANGIOGRAM  03/22/2013   Procedure: CORONARY ANGIOGRAM;  Surgeon: [name], MD;  Location: [facility] CATH LAB;  Service: Cardiovascular;;  . KNEE ARTHROSCOPY     2 on left  . RIGHT HEART CATHETERIZATION  03/22/2013   Procedure: RIGHT HEART CATH;  Surgeon: [name], MD;  Location: [facility] CATH LAB;  Service: Cardiovascular;;  . SHOULDER SURGERY Left   . THYROIDECTOMY       reports that  has never smoked. she has never used smokeless tobacco. She reports that she does not drink alcohol or use drugs.  Allergies  Allergen Reactions  . Adhesive [Tape] Other (See Comments)    Skin peels up.   Marland Kitchen Penicillins Hives    Has patient had a PCN reaction causing immediate rash, facial/tongue/throat swelling, SOB or lightheadedness with hypotension: No Has patient had a PCN reaction causing severe rash involving mucus membranes or skin necrosis: No Has patient had a PCN reaction that required hospitalization No Has patient had a PCN reaction occurring within the last 10 years: No If all of the above answers are "NO", then may proceed with Cephalosporin use.     Family History  Problem Relation Age of Onset  . Cancer - Colon Father   . Stroke Brother   . Cancer Sister        spine?  Prior to Admission medications   Medication Sig Start Date End Date Taking? Authorizing Provider  ALPRAZolam Prudy Feeler) 1 MG tablet Take 0.5 mg by mouth at bedtime. Anxiety  10/08/15  Yes [provider]  bisoprolol (ZEBETA) 5 MG tablet Take 0.5 tablets (2.5 mg total) by mouth daily. 07/24/16  Yes Hilty, Lisette Abu, MD  buPROPion (WELLBUTRIN) 100 MG tablet Take 100 mg by mouth every morning. 10/06/15  Yes [provider]  leflunomide (ARAVA) 20 MG tablet Take 20 mg by mouth daily.   Yes [provider]  mirtazapine (REMERON) 30 MG tablet Take 30 mg by mouth at bedtime.    Yes [provider]  nortriptyline (PAMELOR) 75 MG capsule Take 75 mg by mouth at bedtime.   Yes [provider]  predniSONE (DELTASONE) 5 MG tablet Take 1.5 tablets (7.5 mg total) by mouth daily with breakfast. Resume in 4days 10/27/15  Yes Zannie Cove, MD  TRAVATAN Z 0.004 % SOLN ophthalmic solution Place 1 drop into both eyes at bedtime. 09/16/15  Yes [provider]  vitamin B-12 (CYANOCOBALAMIN) 1000 MCG tablet Take 1 tablet (1,000 mcg total) by mouth daily. 03/22/15  Yes Osvaldo Shipper, MD    Physical Exam: Vitals:   10/30/17 1758 10/30/17 1907 10/30/17 2010 10/30/17 2134  BP: (!) 135/103 136/61 (!) 144/67 (!) 125/59  Pulse: (!) 117 (!) 106 (!) 108 (!) 108  Resp: (!) 24 19 18  (!) 25  Temp: (!) 97.3 F (36.3 C)     TempSrc: Oral     SpO2: 94%  98% 98%  Weight: 53.1 kg (117 lb)     Height: 5\' 3"  (1.6 m)       Constitutional: NAD, calm, comfortable Eyes: PERRL, lids and conjunctivae normal ENMT: Mucous membranes are moist. Posterior pharynx clear of any exudate or lesions.Normal dentition.  Neck: normal, supple, no masses, no thyromegaly Respiratory: clear to auscultation bilaterally, no wheezing, no crackles. Normal respiratory effort. No accessory muscle use.  Cardiovascular: Regular rate and rhythm, no murmurs / rubs / gallops. No extremity edema. 2+ pedal pulses. No carotid bruits.  Abdomen: no tenderness, no masses palpated. No hepatosplenomegaly. Bowel sounds positive.  Musculoskeletal: no clubbing / cyanosis. No joint deformity upper and lower extremities. Good ROM, no contractures. Normal muscle tone.  Skin: no rashes, lesions, ulcers. No induration Neurologic: CN 2-12 grossly intact. Sensation intact, DTR normal. Strength 5/5 in all 4.  Psychiatric: Normal judgment and insight. Alert and oriented x 3. Normal mood.    Labs on Admission: I have personally reviewed following labs and imaging studies  CBC: Recent Labs  Lab 10/30/17 1848 10/30/17 1904    WBC 19.4*  --   HGB 12.7 13.6  HCT 38.5 40.0  MCV 94.8  --   PLT 167  --    Basic Metabolic Panel: Recent Labs  Lab 10/30/17 1848 10/30/17 1904  NA 132* 133*  K 4.6 4.5  CL 98* 100*  CO2 19*  --   GLUCOSE 80 78  BUN 25* 24*  CREATININE 1.31* 1.20*  CALCIUM 8.8*  --    GFR: Estimated Creatinine Clearance: 30.9 mL/min (A) (by C-G formula based on SCr of 1.2 mg/dL (H)). Liver Function Tests: Recent Labs  Lab 10/30/17 1848  AST 18  ALT 15  ALKPHOS 75  BILITOT 2.1*  PROT 6.5  ALBUMIN 3.3*   No results for input(s): LIPASE, AMYLASE in the last 168 hours. No results for input(s): AMMONIA in the last 168 hours. Coagulation Profile:  No results for input(s): INR, PROTIME in the last 168 hours. Cardiac Enzymes: No results for input(s): CKTOTAL, CKMB, CKMBINDEX, TROPONINI in the last 168 hours. BNP (last 3 results) No results for input(s): PROBNP in the last 8760 hours. HbA1C: No results for input(s): HGBA1C in the last 72 hours. CBG: Recent Labs  Lab 10/30/17 2054  GLUCAP 82   Lipid Profile: No results for input(s): CHOL, HDL, LDLCALC, TRIG, CHOLHDL, LDLDIRECT in the last 72 hours. Thyroid Function Tests: No results for input(s): TSH, T4TOTAL, FREET4, T3FREE, THYROIDAB in the last 72 hours. Anemia Panel: No results for input(s): VITAMINB12, FOLATE, FERRITIN, TIBC, IRON, RETICCTPCT in the last 72 hours. Urine analysis:    Component Value Date/Time   COLORURINE YELLOW 10/30/2017 2116   APPEARANCEUR CLEAR 10/30/2017 2116   LABSPEC 1.016 10/30/2017 2116   PHURINE 6.0 10/30/2017 2116   GLUCOSEU NEGATIVE 10/30/2017 2116   HGBUR MODERATE (A) 10/30/2017 2116   BILIRUBINUR NEGATIVE 10/30/2017 2116   KETONESUR 80 (A) 10/30/2017 2116   PROTEINUR 100 (A) 10/30/2017 2116   UROBILINOGEN 0.2 03/21/2015 0236   NITRITE NEGATIVE 10/30/2017 2116   LEUKOCYTESUR SMALL (A) 10/30/2017 2116    Radiological Exams on Admission: Dg Chest 2 View  Result Date: 10/30/2017 CLINICAL  DATA:  Status post possible fall with injury to the ribs. EXAM: CHEST  2 VIEW COMPARISON:  10/19/2017 FINDINGS: Prior sending aortic stent graft repair. Cardiomediastinal silhouette is normal. Mediastinal contours appear intact. Calcific atherosclerotic disease of the aorta. Stable elevation of the left hemidiaphragm. There is no evidence of focal airspace consolidation, pleural effusion or pneumothorax. Osseous structures are without acute abnormality. Thoracic kyphosis with stable anterior compression deformity of T6 vertebral body. Minimally displaced left fourth posterior rib fracture. Soft tissues are grossly normal. IMPRESSION: Minimally displaced left 4th posterior rib fracture. No evidence of pneumothorax. Electronically Signed   By: Ted Mcalpine M.D.   On: 10/30/2017 20:01   Ct Head Wo Contrast  Result Date: 10/30/2017 CLINICAL DATA:  Status post fall. EXAM: CT HEAD WITHOUT CONTRAST TECHNIQUE: Contiguous axial images were obtained from the base of the skull through the vertex without intravenous contrast. COMPARISON:  10/14/2015 FINDINGS: Brain: No evidence of acute infarction, hemorrhage, hydrocephalus, extra-axial collection or mass lesion/mass effect. Moderate brain parenchymal volume loss and deep white matter microangiopathy. Chronic lacunar infarct of the right thalamus. Vascular: Atherosclerotic calcifications at the intra cavernous carotid arteries. Skull: Normal. Negative for fracture or focal lesion. Sinuses/Orbits: No acute finding. Other: None. IMPRESSION: No evidence of acute traumatic injury to head. Moderate brain parenchymal atrophy and chronic microvascular disease. Chronic right thalamic lacunar infarct. Electronically Signed   By: Ted Mcalpine M.D.   On: 10/30/2017 20:41   Ct Chest Wo Contrast  Result Date: 10/30/2017 CLINICAL DATA:  Fall with rib pain. EXAM: CT CHEST WITHOUT CONTRAST TECHNIQUE: Multidetector CT imaging of the chest was performed following the  standard protocol without IV contrast. COMPARISON:  05/27/2015 FINDINGS: Cardiovascular: Heart size upper normal to mildly enlarged. No pericardial effusion. Patient is status post aortic valve replacement. Coronary artery calcification is evident. Atherosclerotic calcification is noted in the wall of the thoracic aorta. Prominence of the main pulmonary artery raises the question of pulmonary arterial hypertension. Mediastinum/Nodes: No mediastinal lymphadenopathy. No evidence for gross hilar lymphadenopathy although assessment is limited by the lack of intravenous contrast on today's study. Lungs/Pleura: Evaluation of lung parenchyma is obscured scratchy at fine detail of lung parenchyma is obscured by patient breathing motion during image acquisition. Probable emphysema with  some interlobular septal thickening in the lung apices. Patchy atelectasis or pneumonia noted posterior left lower lobe. No pneumothorax. No pleural effusion. Upper Abdomen: Unremarkable. Musculoskeletal: Multiple left-sided rib fractures of varying age are identified. Anterior old fractures of lower left ribs evident. Fractures of the posterior tenth, eleventh, and twelfth ribs on the left are nonacute as there is evidence of bony callus, but these are not yet healed. Old anterior right rib fractures identified in the lower ribs but incompletely visualized. IMPRESSION: 1. Posterior fractures of the left tenth, eleventh, and twelfth ribs are at least subacute as bony callus is evident but the fracture lines are still visible consistent with incomplete healing. 2. Old healed fractures are identified in the anterior lower ribs bilaterally. 3. No pneumothorax or pleural effusion. 4. Mild patchy airspace opacity posterior left lower lobe compatible with atelectasis or pneumonia. 5.  Aortic Atherosclerois (ICD10-170.0) 6. Question pulmonary arterial hypertension. Electronically Signed   By: Kennith Center M.D.   On: 10/30/2017 20:44    EKG:  Independently reviewed.  Assessment/Plan Principal Problem:   HCAP (healthcare-associated pneumonia) Active Problems:   Rheumatoid arthritis involving multiple joints (HCC)   DM2 (diabetes mellitus, type 2) (HCC)   Chronic adrenal insufficiency (HCC)   Sepsis (HCC)   S/P TAVR (transcatheter aortic valve replacement)   Fracture of three ribs, left, closed, initial encounter    1. Sepsis due to HCAP - 1. PNA pathway 2. Cultures pending including urine 3. Started on empiric merrem and vanc in ED, will leave patient on these for now. 4. IVF 2L bolus 2. Chronic adrenal insufficiency - 1. Patient states she takes 5mg  prednisone daily chronically, daughter also told pharm tech same. 2. Odd though that pharm says she hasnt filled since 11/2016 3. Will triple up once again in setting of acute illness 4. 15mg  prednisone PO daily first dose now 3. Subacute Rib Fx - 1. IS 2. norco PRN pain 4. RA - 1. Prednisone as above 2. Holding Arava 5. DM2 - 1. Diet controlled at baseline 2. Will put on sensitive SSI AC due to steroid increase 6. S/p TAVR - chronic and stable  DVT prophylaxis: Lovenox Code Status: Full Family Communication: No family in room Disposition Plan: TBD Consults called: None Admission status: Admit to inpatient - inpatient status for HCAP treatment, h/o ESBL, etc.   GARDNER, JARED M. DO [facility] Pager [phone]  If 7AM-7PM, please contact day team taking care of patient [email or web address] Password [facility]  10/30/2017, 9:53 PM

## 2017-10-30 NOTE — ED Notes (Signed)
Patient transported to CT 

## 2017-10-30 NOTE — ED Triage Notes (Signed)
Pt verbalizes trying to walk with walker and had to go sit back down because "head was spinning." Pt then verbalizes sat in the floor because she was dizzy. Family at bedside verbalizes "she said she fell and hurt her ribs."

## 2017-10-30 NOTE — ED Notes (Signed)
Isaacs MD at bedside and verbalizes will place additional orders as needed.

## 2017-10-30 NOTE — Progress Notes (Signed)
Pharmacy Antibiotic Note  [name] is a 83 y.o. female admitted on 10/30/2017 with sepsis.  Pharmacy has been consulted for vancomycin and meropenem dosing.  Patient with recent ESBL E. Coli UTI.  Vancomycin and meropenem 1st doses given in ED  Plan:  Vancomycin 500mg  IV q24h  Meropenem 1gm IV q12h  Daily SCr  Check vancomycin levels if remains on vancomycin > 4 days  Narrow per culture results  Height: 5\' 3"  (160 cm) Weight: 117 lb (53.1 kg) IBW/kg (Calculated) : 52.4  Temp (24hrs), Avg:97.3 F (36.3 C), Min:97.3 F (36.3 C), Max:97.3 F (36.3 C)  Recent Labs  Lab 10/30/17 1848 10/30/17 1903 10/30/17 1904 10/30/17 2141  WBC 19.4*  --   --   --   CREATININE 1.31*  --  1.20*  --   LATICACIDVEN  --  1.37  --  0.91    Estimated Creatinine Clearance: 30.9 mL/min (A) (by C-G formula based on SCr of 1.2 mg/dL (H)).    Allergies  Allergen Reactions  . Adhesive [Tape] Other (See Comments)    Skin peels up.   12/28/17 Penicillins Hives    Has patient had a PCN reaction causing immediate rash, facial/tongue/throat swelling, SOB or lightheadedness with hypotension: No Has patient had a PCN reaction causing severe rash involving mucus membranes or skin necrosis: No Has patient had a PCN reaction that required hospitalization No Has patient had a PCN reaction occurring within the last 10 years: No If all of the above answers are "NO", then may proceed with Cephalosporin use.     Antimicrobials this admission:  1/10 vanco >> 1/10 meropenem >>  Dose adjustments this admission:   Microbiology results:  1/10 BCx:  1/10 UCx: (collected after 1st dose of meropenem) 12/30 Ucx: e. Coli - ESBL (suc to amp/sulb, gent, imi, NTF, P/T, TMP/SMZ) 12/30 Bcx: NG-F  Thank you for allowing pharmacy to be a part of this patient's care.  1/31, PharmD, BCPS.   Pager: 1/31 10/30/2017 9:54 PM

## 2017-10-31 ENCOUNTER — Other Ambulatory Visit: Payer: Self-pay

## 2017-10-31 DIAGNOSIS — S2242XA Multiple fractures of ribs, left side, initial encounter for closed fracture: Secondary | ICD-10-CM

## 2017-10-31 DIAGNOSIS — M069 Rheumatoid arthritis, unspecified: Secondary | ICD-10-CM

## 2017-10-31 DIAGNOSIS — Z952 Presence of prosthetic heart valve: Secondary | ICD-10-CM

## 2017-10-31 DIAGNOSIS — A419 Sepsis, unspecified organism: Principal | ICD-10-CM

## 2017-10-31 DIAGNOSIS — J189 Pneumonia, unspecified organism: Secondary | ICD-10-CM

## 2017-10-31 DIAGNOSIS — E119 Type 2 diabetes mellitus without complications: Secondary | ICD-10-CM

## 2017-10-31 DIAGNOSIS — E2740 Unspecified adrenocortical insufficiency: Secondary | ICD-10-CM

## 2017-10-31 LAB — CREATININE, SERUM
Creatinine, Ser: 0.92 mg/dL (ref 0.44–1.00)
GFR, EST NON AFRICAN AMERICAN: 57 mL/min — AB (ref >60)

## 2017-10-31 LAB — GLUCOSE, CAPILLARY
Glucose-Capillary: 69 mg/dL (ref 65–99)
Glucose-Capillary: 126 mg/dL — ABNORMAL HIGH (ref 65–99)
Glucose-Capillary: 178 mg/dL — ABNORMAL HIGH (ref 65–99)
Glucose-Capillary: 162 mg/dL — ABNORMAL HIGH (ref 65–99)

## 2017-10-31 LAB — EXPECTORATED SPUTUM ASSESSMENT W GRAM STAIN, RFLX TO RESP C

## 2017-10-31 LAB — HIV ANTIBODY (ROUTINE TESTING W REFLEX): HIV Screen 4th Generation wRfx: NONREACTIVE

## 2017-10-31 LAB — MRSA PCR SCREENING: MRSA BY PCR: NEGATIVE

## 2017-10-31 MED ORDER — LIP MEDEX EX OINT
TOPICAL_OINTMENT | CUTANEOUS | Status: DC | PRN
  Filled 2017-10-31: qty 7

## 2017-10-31 MED ORDER — SODIUM CHLORIDE 0.9 % IV SOLN
1.0000 g | Freq: Once | INTRAVENOUS | Status: AC
  Administered 2017-10-31: 1 g via INTRAVENOUS
  Filled 2017-10-31: qty 10

## 2017-10-31 MED ORDER — ENSURE ENLIVE PO LIQD
237.0000 mL | Freq: Three times a day (TID) | ORAL | Status: DC
  Administered 2017-10-31 – 2017-11-03 (×6): 237 mL via ORAL

## 2017-10-31 MED ORDER — VITAMIN D (ERGOCALCIFEROL) 1.25 MG (50000 UNIT) PO CAPS
50000.0000 [IU] | ORAL_CAPSULE | ORAL | Status: DC
  Administered 2017-11-01: 50000 [IU] via ORAL
  Filled 2017-10-31: qty 1

## 2017-10-31 MED ORDER — ENOXAPARIN SODIUM 30 MG/0.3ML ~~LOC~~ SOLN
30.0000 mg | SUBCUTANEOUS | Status: DC
  Administered 2017-10-31 – 2017-11-03 (×4): 30 mg via SUBCUTANEOUS
  Filled 2017-10-31 (×4): qty 0.3

## 2017-10-31 NOTE — Progress Notes (Signed)
PROGRESS NOTE  [name]  IFO:[id number] DOB: 14-Jul-1935 DOA: 10/30/2017 PCP: Jarome Matin, MD   Brief Narrative: [name] is a 83 y.o. female with medical history significant of severe AS s/p TAVR, RA on chronic steroids, iatrogenic adrenal insufficiency, memory deficit (likely early dementia), pancreatic insufficiency, prior ESBL e.coli UTIs.  Patient was actually admitted for ESBL e.coli UTI on 12/30-1/2, got 3 days of merrem, switched to bactrim and discharged.  Since discharge has been weak generally at home.  Had at least 3 falls in the past week.  Becomes very dizzy every time she tries to stand up.  Has also developed an associated productive, painful cough.  Decreased appetite.  No known fever but does have chills.  In the ED, LLL PNA and L 10th-12th rib Fx that appear sub-acute on CT scan.  WBC 19k, UA small leuks, 6-30 WBC.  Assessment & Plan: Principal Problem:   HCAP (healthcare-associated pneumonia) Active Problems:   Rheumatoid arthritis involving multiple joints (HCC)   DM2 (diabetes mellitus, type 2) (HCC)   Chronic adrenal insufficiency (HCC)   Sepsis (HCC)   S/P TAVR (transcatheter aortic valve replacement)   Fracture of three ribs, left, closed, initial encounter  Sepsis due to LLL HCAP: Complication of atelectasis from splinting due to rib fractures.  - Continue meropenem. DC vanc.  - Monitor blood and sputum cultures.  - Reinforced need for diligent use of incentive spirometry.   Pyuria: With history of ESBL UTI.  - On meropenem as above - Monitor urine culture, though patient denies symptoms.   Chronic adrenal insufficiency:  - Will give prednisone 15mg  (from 5mg  daily dose), monitor BP closely.   Subacute rib fractures: Posterior left 10-12th w/callus noted still w/fracture lines indicating incomplete healing. Also bilateral anterior lower fractures have healed. Due to frailty, chronic steroids and frequent falls. No PTX/flail chest.   -  Norco for pain control - Incentive spirometry  HTN: Chronic, stable. - Continue bisoprolol  RA: Chronic, stable.  - Continue steroids - Hold leflunomide  T2DM: Diet-controlled - Sensitive SSI ordered while increasing steroids.  - Update HbA1c  Moderate malnutrition:  - RD consulted, supplement ordered. - Remeron as below  Hypocalcemia: Ionized Ca very low at 0.79.  - Will start vitamin D after checking level. - Give calcium gluconate now, recheck in AM  Hyperbilirubinemia: Mild, LFTs otherwise normal, suspect due to sepsis.  - Monitor in AM.   Depression: Chronic, stable.  - Continue wellbutrin, remeron, nortriptyline  S/p TAVR: No acute issues noted.   DVT prophylaxis: Lovenox Code Status: Full Family Communication: None at bedside Disposition Plan: Uncertain. PT consulted  Consultants:   None  Procedures:   None  Antimicrobials:  Vancomycin 1/10 - 1/11  Meropenem 1/10 >>    Subjective: Pain is controlled, dyspnea improved, cough is improved.   Objective: Vitals:   10/31/17 0100 10/31/17 0150 10/31/17 0200 10/31/17 1417  BP: (!) 113/53 121/89  122/74  Pulse: (!) 103 (!) 106  83  Resp: 14 20  18   Temp:  98.3 F (36.8 C)  98.8 F (37.1 C)  TempSrc:  Oral  Oral  SpO2: 96% 94%  96%  Weight:   49.3 kg (108 lb 11 oz)   Height:   5\' 3"  (1.6 m)     Intake/Output Summary (Last 24 hours) at 10/31/2017 1526 Last data filed at 10/31/2017 1300 Gross per 24 hour  Intake 1660 ml  Output -  Net 1660 ml    10/30/17 1758 10/31/17 0200  Weight: 53.1 kg (117 lb) 49.3 kg (108 lb 11 oz)    Gen: Elderly, pleasant female in no distress  Pulm: Non-labored breathing, shallow tachypnea. Diminished at left base.   CV: Regular rate and rhythm. No murmur, rub, or gallop. No JVD, no pedal edema. GI: Abdomen soft, non-tender, non-distended, with normoactive bowel sounds. No organomegaly or masses felt. MSK: Callus formation on left ribs is palpable,  tender, light ecchymosis on left anterior lower ribs without visible or palpable deformities.  Skin: As above, otherwise no wounds Neuro: Alert and oriented. No focal neurological deficits. Psych: Judgement and insight appear normal. Mood & affect appropriate.   Data Reviewed: I have personally reviewed following labs and imaging studies  CBC: Recent Labs  Lab 10/30/17 1848 10/30/17 1904  WBC 19.4*  --   HGB 12.7 13.6  HCT 38.5 40.0  MCV 94.8  --   PLT 167  --    Basic Metabolic Panel: Recent Labs  Lab 10/30/17 1848 10/30/17 1904 10/31/17 0612  NA 132* 133*  --   K 4.6 4.5  --   CL 98* 100*  --   CO2 19*  --   --   GLUCOSE 80 78  --   BUN 25* 24*  --   CREATININE 1.31* 1.20* 0.92  CALCIUM 8.8*  --   --    GFR: Estimated Creatinine Clearance: 38 mL/min (by C-G formula based on SCr of 0.92 mg/dL). Liver Function Tests: Recent Labs  Lab 10/30/17 1848  AST 18  ALT 15  ALKPHOS 75  BILITOT 2.1*  PROT 6.5  ALBUMIN 3.3*   No results for input(s): LIPASE, AMYLASE in the last 168 hours. No results for input(s): AMMONIA in the last 168 hours. Coagulation Profile: No results for input(s): INR, PROTIME in the last 168 hours. Cardiac Enzymes: No results for input(s): CKTOTAL, CKMB, CKMBINDEX, TROPONINI in the last 168 hours. BNP (last 3 results) No results for input(s): PROBNP in the last 8760 hours. HbA1C: No results for input(s): HGBA1C in the last 72 hours. CBG: Recent Labs  Lab 10/30/17 2054 10/30/17 2205 10/31/17 0804 10/31/17 1143  GLUCAP 82 57* 69 126*   Lipid Profile: No results for input(s): CHOL, HDL, LDLCALC, TRIG, CHOLHDL, LDLDIRECT in the last 72 hours. Thyroid Function Tests: No results for input(s): TSH, T4TOTAL, FREET4, T3FREE, THYROIDAB in the last 72 hours. Anemia Panel: No results for input(s): VITAMINB12, FOLATE, FERRITIN, TIBC, IRON, RETICCTPCT in the last 72 hours. Urine analysis:    Component Value Date/Time   COLORURINE YELLOW  10/30/2017 2116   APPEARANCEUR CLEAR 10/30/2017 2116   LABSPEC 1.016 10/30/2017 2116   PHURINE 6.0 10/30/2017 2116   GLUCOSEU NEGATIVE 10/30/2017 2116   HGBUR MODERATE (A) 10/30/2017 2116   BILIRUBINUR NEGATIVE 10/30/2017 2116   KETONESUR 80 (A) 10/30/2017 2116   PROTEINUR 100 (A) 10/30/2017 2116   UROBILINOGEN 0.2 03/21/2015 0236   NITRITE NEGATIVE 10/30/2017 2116   LEUKOCYTESUR SMALL (A) 10/30/2017 2116   Recent Results (from the past 240 hour(s))  Blood culture (routine x 2)     Status: None (Preliminary result)   Collection Time: 10/30/17  6:48 PM  Result Value Ref Range Status   Specimen Description BLOOD LEFT ANTECUBITAL  Final   Special Requests   Final    BOTTLES DRAWN AEROBIC AND ANAEROBIC Blood Culture adequate volume   Culture   Final    NO GROWTH < 24 HOURS Performed at [facility], [address] [place], [place] [address]    Report Status PENDING  Incomplete  Blood culture (routine x 2)     Status: None (Preliminary result)   Collection Time: 10/30/17  7:00 PM  Result Value Ref Range Status   Specimen Description BLOOD LEFT FOREARM  Final   Special Requests   Final    BOTTLES DRAWN AEROBIC AND ANAEROBIC Blood Culture adequate volume   Culture   Final    NO GROWTH < 24 HOURS Performed at [facility], [address] [place], [place] [address]    Report Status PENDING  Incomplete  Culture, sputum-assessment     Status: None   Collection Time: 10/31/17  1:27 AM  Result Value Ref Range Status   Specimen Description SPUTUM  Final   Special Requests NONE  Final   Sputum evaluation   Final    Sputum specimen not acceptable for testing.  Please recollect.   Gram Stain Report Called to,Read Back By and Verified With: DEBBIE,RN 5 EAST @0241  10/31/17 MKELLY   Report Status 10/31/2017 FINAL  Final  MRSA PCR Screening     Status: None   Collection Time: 10/31/17  8:00 AM  Result Value Ref Range Status   MRSA by PCR NEGATIVE NEGATIVE Final    Comment:         The GeneXpert MRSA Assay (FDA approved for NASAL specimens only), is one component of a comprehensive MRSA colonization surveillance program. It is not intended to diagnose MRSA infection nor to guide or monitor treatment for MRSA infections.       Radiology Studies: Dg Chest 2 View  Result Date: 10/30/2017 CLINICAL DATA:  Status post possible fall with injury to the ribs. EXAM: CHEST  2 VIEW COMPARISON:  10/19/2017 FINDINGS: Prior sending aortic stent graft repair. Cardiomediastinal silhouette is normal. Mediastinal contours appear intact. Calcific atherosclerotic disease of the aorta. Stable elevation of the left hemidiaphragm. There is no evidence of focal airspace consolidation, pleural effusion or pneumothorax. Osseous structures are without acute abnormality. Thoracic kyphosis with stable anterior compression deformity of T6 vertebral body. Minimally displaced left fourth posterior rib fracture. Soft tissues are grossly normal. IMPRESSION: Minimally displaced left 4th posterior rib fracture. No evidence of pneumothorax. Electronically Signed   By: Ted Mcalpine M.D.   On: 10/30/2017 20:01   Ct Head Wo Contrast  Result Date: 10/30/2017 CLINICAL DATA:  Status post fall. EXAM: CT HEAD WITHOUT CONTRAST TECHNIQUE: Contiguous axial images were obtained from the base of the skull through the vertex without intravenous contrast. COMPARISON:  10/14/2015 FINDINGS: Brain: No evidence of acute infarction, hemorrhage, hydrocephalus, extra-axial collection or mass lesion/mass effect. Moderate brain parenchymal volume loss and deep white matter microangiopathy. Chronic lacunar infarct of the right thalamus. Vascular: Atherosclerotic calcifications at the intra cavernous carotid arteries. Skull: Normal. Negative for fracture or focal lesion. Sinuses/Orbits: No acute finding. Other: None. IMPRESSION: No evidence of acute traumatic injury to head. Moderate brain parenchymal atrophy and chronic  microvascular disease. Chronic right thalamic lacunar infarct. Electronically Signed   By: Ted Mcalpine M.D.   On: 10/30/2017 20:41   Ct Chest Wo Contrast  Result Date: 10/30/2017 CLINICAL DATA:  Fall with rib pain. EXAM: CT CHEST WITHOUT CONTRAST TECHNIQUE: Multidetector CT imaging of the chest was performed following the standard protocol without IV contrast. COMPARISON:  05/27/2015 FINDINGS: Cardiovascular: Heart size upper normal to mildly enlarged. No pericardial effusion. Patient is status post aortic valve replacement. Coronary artery calcification is evident. Atherosclerotic calcification is noted  in the wall of the thoracic aorta. Prominence of the main pulmonary artery raises the question of pulmonary arterial hypertension. Mediastinum/Nodes: No mediastinal lymphadenopathy. No evidence for gross hilar lymphadenopathy although assessment is limited by the lack of intravenous contrast on today's study. Lungs/Pleura: Evaluation of lung parenchyma is obscured scratchy at fine detail of lung parenchyma is obscured by patient breathing motion during image acquisition. Probable emphysema with some interlobular septal thickening in the lung apices. Patchy atelectasis or pneumonia noted posterior left lower lobe. No pneumothorax. No pleural effusion. Upper Abdomen: Unremarkable. Musculoskeletal: Multiple left-sided rib fractures of varying age are identified. Anterior old fractures of lower left ribs evident. Fractures of the posterior tenth, eleventh, and twelfth ribs on the left are nonacute as there is evidence of bony callus, but these are not yet healed. Old anterior right rib fractures identified in the lower ribs but incompletely visualized. IMPRESSION: 1. Posterior fractures of the left tenth, eleventh, and twelfth ribs are at least subacute as bony callus is evident but the fracture lines are still visible consistent with incomplete healing. 2. Old healed fractures are identified in the anterior  lower ribs bilaterally. 3. No pneumothorax or pleural effusion. 4. Mild patchy airspace opacity posterior left lower lobe compatible with atelectasis or pneumonia. 5.  Aortic Atherosclerois (ICD10-170.0) 6. Question pulmonary arterial hypertension. Electronically Signed   By: Kennith Center M.D.   On: 10/30/2017 20:44    Scheduled Meds: . ALPRAZolam  0.5 mg Oral QHS  . bisoprolol  2.5 mg Oral Daily  . buPROPion  100 mg Oral q morning - 10a  . enoxaparin (LOVENOX) injection  30 mg Subcutaneous Q24H  . feeding supplement (ENSURE ENLIVE)  237 mL Oral TID BM  . insulin aspart  0-9 Units Subcutaneous TID WC  . latanoprost  1 drop Both Eyes QHS  . mirtazapine  30 mg Oral QHS  . nortriptyline  75 mg Oral QHS  . predniSONE  15 mg Oral Q breakfast  . vitamin B-12  1,000 mcg Oral Daily   Continuous Infusions: . meropenem (MERREM) IV Stopped (10/31/17 0839)     LOS: 1 day   Time spent: 25 minutes.  Hazeline Junker, MD [facility] Pager [phone]  If 7PM-7AM, please contact night-coverage [email or web address] Password [facility] 10/31/2017, 3:26 PM

## 2017-10-31 NOTE — Progress Notes (Signed)
Pt has arrived to 1517 from the ED and orders initiated. Pt has pain & bruising to rt lateral ribs -  denies pain to the left ribs

## 2017-10-31 NOTE — Care Management Note (Signed)
Case Management Note  Patient Details  Name: [name] MRN: [id number] Date of Birth: [birth year]  Subjective/Objective:                  LLL PNA and L 10th-12th rib Fx that appear sub-acute on CT scan.  WBC 19k, UA small leuks, 6-30 WBC.    Action/Plan: Date:  October 31, 2017 Chart reviewed for concurrent status and case management needs.  Will continue to follow patient progress.  Discharge Planning: following for needs.  None present at this time of review. Expected discharge date: November 03 2017 Marcelle Smiling, BSN, [place], [place]   [phone]   Expected Discharge Date:  (unknown)               Expected Discharge Plan:  Home w Home Health Services  In-House Referral:     Discharge planning Services  CM Consult  Post Acute Care Choice:  Resumption of Svcs/PTA Provider, Home Health Choice offered to:  NA  DME Arranged:    DME Agency:     HH Arranged:    HH Agency:  [facility]  Status of Service:  In process, will continue to follow  If discussed at Long Length of Stay Meetings, dates discussed:    Additional Comments:  Golda Acre, RN 10/31/2017, 9:55 AM

## 2017-10-31 NOTE — Progress Notes (Signed)
Initial Nutrition Assessment  DOCUMENTATION CODES:   Underweight, Non-severe (moderate) malnutrition in context of acute illness/injury  INTERVENTION:   Ensure Enlive po TID, each supplement provides 350 kcal and 20 grams of protein  NUTRITION DIAGNOSIS:   Moderate Malnutrition related to acute illness(recurrent sepsis) as evidenced by energy intake < or equal to 50% for > or equal to 1 month, moderate fat depletion, moderate muscle depletion.  GOAL:   Patient will meet greater than or equal to 90% of their needs  MONITOR:   PO intake, Supplement acceptance, Weight trends, Labs  REASON FOR ASSESSMENT:   Malnutrition Screening Tool    ASSESSMENT:   Pt with PMH significant for severe AS s/p TAVR, adrenal insufficiency, pancreatic insufficiency, memory deficit, pulmonary fibrosis, and aortic stenosis.Recently admitted to [facility] with sepsis secondary to UTI 12/30. Presents this admission with increasing weakness with multiple falls and decreased appetite. Admitted for HCAP.    RD familiar with this patient from last admission. Reports appetite remained poor post discharge. Pt would typically consume 2 meals per day that consisted of TV dinners with chicken and potatoes. Pt ate 50% of breakfast this morning. Pt usually drinks Ensure but her daughter forgot to buy them when she went home. Denies taste changes or difficulty swallowing. Willing to drink Ensure while in the hospital.   Weight noted to be 120 lb 12/30 and 108 lb this admission. Unsure if 120 lb was a stated weight. Suspect some weight loss, but unable to quantify as pt cannot determine UBW. Nutrition-Focused physical exam completed. Pt shows further muscle and fat depletion. Pt is at high risk for becoming severely malnourished if poor PO and weight loss intake continues.   Medications reviewed and include: SSI, prednisone, Vit B12, IV abx Labs reviewed: Na 133 (L) BUN 24 (H) Creatinine 1.20 (H) calcium ionized 0.79  (L)  NUTRITION - FOCUSED PHYSICAL EXAM:    Most Recent Value  Orbital Region  Mild depletion  Upper Arm Region  Moderate depletion  Thoracic and Lumbar Region  Unable to assess  Buccal Region  Moderate depletion  Temple Region  Severe depletion  Clavicle Bone Region  Moderate depletion  Clavicle and Acromion Bone Region  Moderate depletion  Scapular Bone Region  Unable to assess  Dorsal Hand  Moderate depletion  Patellar Region  Moderate depletion  Anterior Thigh Region  Moderate depletion  Posterior Calf Region  Moderate depletion  Edema (RD Assessment)  None  Hair  Reviewed  Eyes  Reviewed  Mouth  Reviewed  Skin  Reviewed  Nails  Reviewed      Diet Order:  Diet Carb Modified Fluid consistency: Thin; Room service appropriate? Yes  EDUCATION NEEDS:   Not appropriate for education at this time  Skin:  Skin Assessment: Reviewed RN Assessment  Last BM:  10/28/17  Height:   Ht Readings from Last 1 Encounters:  10/31/17 5\' 3"  (1.6 m)    Weight:   Wt Readings from Last 1 Encounters:  10/31/17 108 lb 11 oz (49.3 kg)    Ideal Body Weight:  52.3 kg  BMI:  Body mass index is 19.25 kg/m.  Estimated Nutritional Needs:   Kcal:  1500-1700 kcal/day  Protein:  70-80 g/day  Fluid:  >1.5 L/day    12/29/17 RD, LDN Clinical Nutrition Pager # - [phone]

## 2017-10-31 NOTE — ED Notes (Signed)
ED TO INPATIENT HANDOFF REPORT  Name/Age/Gender [name] 83 y.o. female  Code Status    Code Status Orders  (From admission, onward)        Start     Ordered   10/30/17 2204  Full code  Continuous     10/30/17 2204    Code Status History    Date Active Date Inactive Code Status Order ID Comments User Context   10/19/2017 03:36 10/22/2017 20:04 Full Code [id number]  [name], DO ED   10/20/2015 22:19 10/23/2015 17:13 Full Code [id number]  [name], MD Inpatient   03/17/2015 23:58 03/22/2015 21:25 Full Code [id number]  [name], MD Inpatient   09/09/2014 21:44 09/16/2014 14:04 Full Code [id number]  [name], MD ED   11/09/2013 20:37 11/17/2013 19:40 Full Code [id number]  [name], MD ED   09/16/2013 01:37 09/17/2013 18:49 Full Code [id number]  [name], MD Inpatient   11/12/2011 08:39 11/18/2011 17:41 Full Code [id number]  [name], MD ED   10/01/2011 18:57 10/14/2011 13:15 Full Code [id number]  [name], MD ED      Home/SNF/Other Home  Chief Complaint low oxygen  Level of Care/Admitting Diagnosis ED Disposition    ED Disposition Condition [facility]: [facility] [100102]  Level of Care: Med-Surg [16]  Diagnosis: HCAP (healthcare-associated pneumonia) [353299]  Admitting Physician: Doreatha Massed  Attending Physician: [name] [phone]  Estimated length of stay: past midnight tomorrow  Certification:: I certify this patient will need inpatient services for at least 2 midnights  PT Class (Do Not Modify): Inpatient [101]  PT Acc Code (Do Not Modify): Private [1]       Medical History Past Medical History:  Diagnosis Date  . Adrenal insufficiency, primary, iatrogenic   . Anxiety   . Aortic stenosis, severe 02/26/2013  . Chest pain    myoview 04/13/04-inferoapical thinning with a suggestion of mild apical ischemia, EF 66%  . Depression 03/25/2013  . Gait instability   .  Hearing loss 03/25/2013  . Memory deficit 03/25/2013  . Murmur, cardiac   . Osteoporosis   . Palpitations   . Pancreatic insufficiency 11/09/2012  . Physical deconditioning   . Pneumonia   . Pulmonary fibrosis ([place]) 03/25/2013  . Rheumatoid arthritis(714.0)   . SOBOE (shortness of breath on exertion)    echo 02/23/13-EF 55-60%, possible bicuspid aortic valve with mod to severed stenosis   . Syncope   . UTI (lower urinary tract infection) 10/01/2011    Allergies Allergies  Allergen Reactions  . Adhesive [Tape] Other (See Comments)    Skin peels up.   Marland Kitchen Penicillins Hives    Has patient had a PCN reaction causing immediate rash, facial/tongue/throat swelling, SOB or lightheadedness with hypotension: No Has patient had a PCN reaction causing severe rash involving mucus membranes or skin necrosis: No Has patient had a PCN reaction that required hospitalization No Has patient had a PCN reaction occurring within the last 10 years: No If all of the above answers are "NO", then may proceed with Cephalosporin use.     IV Location/Drains/Wounds Patient Lines/Drains/Airways Status   Active Line/Drains/Airways    Name:   Placement date:   Placement time:   Site:   Days:   Peripheral IV 10/30/17 Left Forearm   10/30/17    1934    Forearm   1   Peripheral IV 10/30/17 Left Antecubital   10/30/17  1934    Antecubital   1          Labs/Imaging Results for orders placed or performed during the hospital encounter of 10/30/17 (from the past 48 hour(s))  Basic metabolic panel     Status: Abnormal   Collection Time: 10/30/17  6:48 PM  Result Value Ref Range   Sodium 132 (L) 135 - 145 mmol/L   Potassium 4.6 3.5 - 5.1 mmol/L   Chloride 98 (L) 101 - 111 mmol/L   CO2 19 (L) 22 - 32 mmol/L   Glucose, Bld 80 65 - 99 mg/dL   BUN 25 (H) 6 - 20 mg/dL   Creatinine, Ser 1.31 (H) 0.44 - 1.00 mg/dL   Calcium 8.8 (L) 8.9 - 10.3 mg/dL   GFR calc non Af Amer 37 (L) >60 mL/min   GFR calc Af Amer 43 (L) >60  mL/min    Comment: (NOTE) The eGFR has been calculated using the CKD EPI equation. This calculation has not been validated in all clinical situations. eGFR's persistently <60 mL/min signify possible Chronic Kidney Disease.    Anion gap 15 5 - 15  CBC     Status: Abnormal   Collection Time: 10/30/17  6:48 PM  Result Value Ref Range   WBC 19.4 (H) 4.0 - 10.5 K/uL   RBC 4.06 3.87 - 5.11 MIL/uL   Hemoglobin 12.7 12.0 - 15.0 g/dL   HCT 38.5 36.0 - 46.0 %   MCV 94.8 78.0 - 100.0 fL   MCH 31.3 26.0 - 34.0 pg   MCHC 33.0 30.0 - 36.0 g/dL   RDW 14.4 11.5 - 15.5 %   Platelets 167 150 - 400 K/uL  Hepatic function panel     Status: Abnormal   Collection Time: 10/30/17  6:48 PM  Result Value Ref Range   Total Protein 6.5 6.5 - 8.1 g/dL   Albumin 3.3 (L) 3.5 - 5.0 g/dL   AST 18 15 - 41 U/L   ALT 15 14 - 54 U/L   Alkaline Phosphatase 75 38 - 126 U/L   Total Bilirubin 2.1 (H) 0.3 - 1.2 mg/dL   Bilirubin, Direct 0.6 (H) 0.1 - 0.5 mg/dL   Indirect Bilirubin 1.5 (H) 0.3 - 0.9 mg/dL  I-Stat CG4 Lactic Acid, ED     Status: None   Collection Time: 10/30/17  7:03 PM  Result Value Ref Range   Lactic Acid, Venous 1.37 0.5 - 1.9 mmol/L  I-Stat Chem 8, ED     Status: Abnormal   Collection Time: 10/30/17  7:04 PM  Result Value Ref Range   Sodium 133 (L) 135 - 145 mmol/L   Potassium 4.5 3.5 - 5.1 mmol/L   Chloride 100 (L) 101 - 111 mmol/L   BUN 24 (H) 6 - 20 mg/dL   Creatinine, Ser 1.20 (H) 0.44 - 1.00 mg/dL   Glucose, Bld 78 65 - 99 mg/dL   Calcium, Ion 0.79 (LL) 1.15 - 1.40 mmol/L   TCO2 20 (L) 22 - 32 mmol/L   Hemoglobin 13.6 12.0 - 15.0 g/dL   HCT 40.0 36.0 - 46.0 %   Comment NOTIFIED PHYSICIAN   CBG monitoring, ED     Status: None   Collection Time: 10/30/17  8:54 PM  Result Value Ref Range   Glucose-Capillary 82 65 - 99 mg/dL  Urinalysis, Routine w reflex microscopic     Status: Abnormal   Collection Time: 10/30/17  9:16 PM  Result Value Ref Range   Color, Urine YELLOW  YELLOW    APPearance CLEAR CLEAR   Specific Gravity, Urine 1.016 1.005 - 1.030   pH 6.0 5.0 - 8.0   Glucose, UA NEGATIVE NEGATIVE mg/dL   Hgb urine dipstick MODERATE (A) NEGATIVE   Bilirubin Urine NEGATIVE NEGATIVE   Ketones, ur 80 (A) NEGATIVE mg/dL   Protein, ur 100 (A) NEGATIVE mg/dL   Nitrite NEGATIVE NEGATIVE   Leukocytes, UA SMALL (A) NEGATIVE   RBC / HPF TOO NUMEROUS TO COUNT 0 - 5 RBC/hpf   WBC, UA 6-30 0 - 5 WBC/hpf   Bacteria, UA NONE SEEN NONE SEEN   Squamous Epithelial / LPF 0-5 (A) NONE SEEN   Mucus PRESENT    Hyaline Casts, UA PRESENT   I-Stat CG4 Lactic Acid, ED     Status: None   Collection Time: 10/30/17  9:41 PM  Result Value Ref Range   Lactic Acid, Venous 0.91 0.5 - 1.9 mmol/L  CBG monitoring, ED     Status: Abnormal   Collection Time: 10/30/17 10:05 PM  Result Value Ref Range   Glucose-Capillary 57 (L) 65 - 99 mg/dL   Dg Chest 2 View  Result Date: 10/30/2017 CLINICAL DATA:  Status post possible fall with injury to the ribs. EXAM: CHEST  2 VIEW COMPARISON:  10/19/2017 FINDINGS: Prior sending aortic stent graft repair. Cardiomediastinal silhouette is normal. Mediastinal contours appear intact. Calcific atherosclerotic disease of the aorta. Stable elevation of the left hemidiaphragm. There is no evidence of focal airspace consolidation, pleural effusion or pneumothorax. Osseous structures are without acute abnormality. Thoracic kyphosis with stable anterior compression deformity of T6 vertebral body. Minimally displaced left fourth posterior rib fracture. Soft tissues are grossly normal. IMPRESSION: Minimally displaced left 4th posterior rib fracture. No evidence of pneumothorax. Electronically Signed   By: Fidela Salisbury M.D.   On: 10/30/2017 20:01   Ct Head Wo Contrast  Result Date: 10/30/2017 CLINICAL DATA:  Status post fall. EXAM: CT HEAD WITHOUT CONTRAST TECHNIQUE: Contiguous axial images were obtained from the base of the skull through the vertex without intravenous  contrast. COMPARISON:  10/14/2015 FINDINGS: Brain: No evidence of acute infarction, hemorrhage, hydrocephalus, extra-axial collection or mass lesion/mass effect. Moderate brain parenchymal volume loss and deep white matter microangiopathy. Chronic lacunar infarct of the right thalamus. Vascular: Atherosclerotic calcifications at the intra cavernous carotid arteries. Skull: Normal. Negative for fracture or focal lesion. Sinuses/Orbits: No acute finding. Other: None. IMPRESSION: No evidence of acute traumatic injury to head. Moderate brain parenchymal atrophy and chronic microvascular disease. Chronic right thalamic lacunar infarct. Electronically Signed   By: Fidela Salisbury M.D.   On: 10/30/2017 20:41   Ct Chest Wo Contrast  Result Date: 10/30/2017 CLINICAL DATA:  Fall with rib pain. EXAM: CT CHEST WITHOUT CONTRAST TECHNIQUE: Multidetector CT imaging of the chest was performed following the standard protocol without IV contrast. COMPARISON:  05/27/2015 FINDINGS: Cardiovascular: Heart size upper normal to mildly enlarged. No pericardial effusion. Patient is status post aortic valve replacement. Coronary artery calcification is evident. Atherosclerotic calcification is noted in the wall of the thoracic aorta. Prominence of the main pulmonary artery raises the question of pulmonary arterial hypertension. Mediastinum/Nodes: No mediastinal lymphadenopathy. No evidence for gross hilar lymphadenopathy although assessment is limited by the lack of intravenous contrast on today's study. Lungs/Pleura: Evaluation of lung parenchyma is obscured scratchy at fine detail of lung parenchyma is obscured by patient breathing motion during image acquisition. Probable emphysema with some interlobular septal thickening in the lung apices. Patchy atelectasis or pneumonia noted posterior  left lower lobe. No pneumothorax. No pleural effusion. Upper Abdomen: Unremarkable. Musculoskeletal: Multiple left-sided rib fractures of varying  age are identified. Anterior old fractures of lower left ribs evident. Fractures of the posterior tenth, eleventh, and twelfth ribs on the left are nonacute as there is evidence of bony callus, but these are not yet healed. Old anterior right rib fractures identified in the lower ribs but incompletely visualized. IMPRESSION: 1. Posterior fractures of the left tenth, eleventh, and twelfth ribs are at least subacute as bony callus is evident but the fracture lines are still visible consistent with incomplete healing. 2. Old healed fractures are identified in the anterior lower ribs bilaterally. 3. No pneumothorax or pleural effusion. 4. Mild patchy airspace opacity posterior left lower lobe compatible with atelectasis or pneumonia. 5.  Aortic Atherosclerois (ICD10-170.0) 6. Question pulmonary arterial hypertension. Electronically Signed   By: Misty Stanley M.D.   On: 10/30/2017 20:44    Pending Labs Unresulted Labs (From admission, onward)   Start     Ordered   10/31/17 0500  Creatinine, serum  Daily,   R     10/30/17 2156   10/30/17 2203  HIV antibody  Once,   R     10/30/17 2204   10/30/17 2203  Culture, sputum-assessment  Once,   R     10/30/17 2204   10/30/17 2203  Gram stain  Once,   R     10/30/17 2204   10/30/17 2203  Strep pneumoniae urinary antigen  Once,   R     10/30/17 2204   10/30/17 2153  Culture, Urine  Once,   R     10/30/17 2152   10/30/17 1833  Blood culture (routine x 2)  BLOOD CULTURE X 2,   STAT     10/30/17 1833      Vitals/Pain Today's Vitals   10/30/17 2010 10/30/17 2134 10/30/17 2200 10/30/17 2300  BP: (!) 144/67 (!) 125/59 (!) 125/99 118/61  Pulse: (!) 108 (!) 108 (!) 108 (!) 105  Resp: 18 (!) 25 (!) 21 18  Temp:      TempSrc:      SpO2: 98% 98%    Weight:      Height:      PainSc:        Isolation Precautions No active isolations  Medications Medications  ALPRAZolam (XANAX) tablet 0.5 mg (not administered)  bisoprolol (ZEBETA) tablet 2.5 mg (not  administered)  buPROPion (WELLBUTRIN) tablet 100 mg (not administered)  mirtazapine (REMERON) tablet 30 mg (not administered)  nortriptyline (PAMELOR) capsule 75 mg (not administered)  vitamin B-12 (CYANOCOBALAMIN) tablet 1,000 mcg (not administered)  latanoprost (XALATAN) 0.005 % ophthalmic solution 1 drop (not administered)  predniSONE (DELTASONE) tablet 15 mg (not administered)  insulin aspart (novoLOG) injection 0-9 Units (not administered)  vancomycin (VANCOCIN) 500 mg in sodium chloride 0.9 % 100 mL IVPB (not administered)  meropenem (MERREM) 1 g in sodium chloride 0.9 % 100 mL IVPB (not administered)  HYDROcodone-acetaminophen (NORCO/VICODIN) 5-325 MG per tablet 1 tablet (not administered)  enoxaparin (LOVENOX) injection 40 mg (not administered)  sodium chloride 0.9 % bolus 1,000 mL (0 mLs Intravenous Stopped 10/30/17 2257)  sodium chloride 0.9 % bolus 1,000 mL (0 mLs Intravenous Stopped 10/30/17 2011)  vancomycin (VANCOCIN) IVPB 1000 mg/200 mL premix (0 mg Intravenous Stopped 10/30/17 2012)  meropenem (MERREM) 1 g in sodium chloride 0.9 % 100 mL IVPB (1 g Intravenous Restarted 10/30/17 1933)  fentaNYL (SUBLIMAZE) injection 50 mcg (50 mcg Intravenous Given  10/30/17 2133)       

## 2017-11-01 LAB — URINE CULTURE

## 2017-11-01 LAB — COMPREHENSIVE METABOLIC PANEL
ALBUMIN: 2.6 g/dL — AB (ref 3.5–5.0)
ALK PHOS: 65 U/L (ref 38–126)
ALT: 11 U/L — AB (ref 14–54)
ANION GAP: 9 (ref 5–15)
AST: 14 U/L — ABNORMAL LOW (ref 15–41)
BUN: 16 mg/dL (ref 6–20)
CALCIUM: 9.5 mg/dL (ref 8.9–10.3)
CO2: 25 mmol/L (ref 22–32)
CREATININE: 0.65 mg/dL (ref 0.44–1.00)
Chloride: 103 mmol/L (ref 101–111)
GFR calc non Af Amer: >60 mL/min (ref >60)
GLUCOSE: 129 mg/dL — AB (ref 65–99)
Potassium: 4.2 mmol/L (ref 3.5–5.1)
Sodium: 137 mmol/L (ref 135–145)
TOTAL PROTEIN: 5.7 g/dL — AB (ref 6.5–8.1)
Total Bilirubin: 0.8 mg/dL (ref 0.3–1.2)

## 2017-11-01 LAB — CBC
HCT: 32.7 % — ABNORMAL LOW (ref 36.0–46.0)
Hemoglobin: 10.9 g/dL — ABNORMAL LOW (ref 12.0–15.0)
MCH: 31.3 pg (ref 26.0–34.0)
MCHC: 33.3 g/dL (ref 30.0–36.0)
MCV: 94 fL (ref 78.0–100.0)
PLATELETS: 140 10*3/uL — AB (ref 150–400)
RBC: 3.48 MIL/uL — ABNORMAL LOW (ref 3.87–5.11)
RDW: 14.4 % (ref 11.5–15.5)
WBC: 13.4 10*3/uL — ABNORMAL HIGH (ref 4.0–10.5)

## 2017-11-01 LAB — GLUCOSE, CAPILLARY
GLUCOSE-CAPILLARY: 165 mg/dL — AB (ref 65–99)
Glucose-Capillary: 118 mg/dL — ABNORMAL HIGH (ref 65–99)
Glucose-Capillary: 159 mg/dL — ABNORMAL HIGH (ref 65–99)
Glucose-Capillary: 111 mg/dL — ABNORMAL HIGH (ref 65–99)

## 2017-11-01 LAB — HEMOGLOBIN A1C
Hgb A1c MFr Bld: 5 % (ref 4.8–5.6)
Mean Plasma Glucose: 96.8 mg/dL

## 2017-11-01 MED ORDER — LEVOFLOXACIN IN D5W 500 MG/100ML IV SOLN
500.0000 mg | INTRAVENOUS | Status: DC
  Administered 2017-11-01 – 2017-11-03 (×3): 500 mg via INTRAVENOUS
  Filled 2017-11-01 (×3): qty 100

## 2017-11-01 MED ORDER — PREDNISONE 20 MG PO TABS
20.0000 mg | ORAL_TABLET | Freq: Every day | ORAL | Status: DC
  Administered 2017-11-02 – 2017-11-03 (×2): 20 mg via ORAL
  Filled 2017-11-01 (×2): qty 1

## 2017-11-01 NOTE — Progress Notes (Addendum)
Pharmacy Antibiotic Note  [name] is a 83 y.o. female admitted on 10/30/2017 with pneumonia.  Pharmacy has been consulted for Levaquin dosing.  Plan: Levaquin 500 mg IV q24h (adjusted for CrCl ~ 43 mL/min) Follow renal function, clinical course  Height: 5\' 3"  (160 cm) Weight: 108 lb 11 oz (49.3 kg) IBW/kg (Calculated) : 52.4  Temp (24hrs), Avg:97.7 F (36.5 C), Min:97.5 F (36.4 C), Max:97.9 F (36.6 C)  Recent Labs  Lab 10/30/17 1848 10/30/17 1903 10/30/17 1904 10/30/17 2141 10/31/17 0612 11/01/17 0600  WBC 19.4*  --   --   --   --  13.4*  CREATININE 1.31*  --  1.20*  --  0.92 0.65  LATICACIDVEN  --  1.37  --  0.91  --   --     Estimated Creatinine Clearance: 43.7 mL/min (by C-G formula based on SCr of 0.65 mg/dL).    Allergies  Allergen Reactions  . Adhesive [Tape] Other (See Comments)    Skin peels up.   12/30/17 Penicillins Hives    Has patient had a PCN reaction causing immediate rash, facial/tongue/throat swelling, SOB or lightheadedness with hypotension: No Has patient had a PCN reaction causing severe rash involving mucus membranes or skin necrosis: No Has patient had a PCN reaction that required hospitalization No Has patient had a PCN reaction occurring within the last 10 years: No If all of the above answers are "NO", then may proceed with Cephalosporin use.     Antimicrobials this admission: 1/10 >> meropenem >>1/12 1/10 >> vancomycin >>1/11 1/12 >> levofloxacin >>  Dose adjustments this admission:   Microbiology results: 1/10 BCx: ngtd 1/10 UCx: insignificant growth 1/11 Sputum: needs recollection  1/11 MRSA PCR: negative  Thank you for allowing pharmacy to be a part of this patient's care. 3/11, PharmD, BCPS Pager: [phone] 11/01/2017  2:56 PM

## 2017-11-01 NOTE — Progress Notes (Signed)
PROGRESS NOTE  [name]  HWE:[id number] DOB: 1935-05-17 DOA: 10/30/2017 PCP: Jarome Matin, MD   Brief Narrative: [name] is a 83 y.o. female with medical history significant of severe AS s/p TAVR, RA on chronic steroids, iatrogenic adrenal insufficiency, memory deficit (likely early dementia), pancreatic insufficiency, prior ESBL e.coli UTIs.  Patient was actually admitted for ESBL e.coli UTI on 12/30-1/2, got 3 days of merrem, switched to bactrim and discharged.  Since discharge has been weak generally at home.  Had at least 3 falls in the past week.  Becomes very dizzy every time she tries to stand up.  Has also developed an associated productive, painful cough.  Decreased appetite.  No known fever but does have chills.  In the ED, LLL PNA and L 10th-12th rib Fx that appear sub-acute on CT scan.  WBC 19k, UA small leuks, 6-30 WBC.  Assessment & Plan: Principal Problem:   HCAP (healthcare-associated pneumonia) Active Problems:   Rheumatoid arthritis involving multiple joints (HCC)   DM2 (diabetes mellitus, type 2) (HCC)   Chronic adrenal insufficiency (HCC)   Sepsis (HCC)   S/P TAVR (transcatheter aortic valve replacement)   Fracture of three ribs, left, closed, initial encounter  Sepsis due to LLL HCAP: Complication of atelectasis from splinting due to rib fractures.  - Stopped vanc, will switch to levaquin - Monitor blood and sputum cultures.  - Reinforced need for diligent use of incentive spirometry.   Pyuria: With history of ESBL UTI, urine culture with < 10k colonies. No symptoms.   Chronic adrenal insufficiency:  - Will increase to prednisone to 20mg  given ongoing orthostatic hypotension. Formerly on 5mg  daily dose, monitor BP closely.   Subacute rib fractures: Posterior left 10-12th w/callus noted still w/fracture lines indicating incomplete healing. Also bilateral anterior lower fractures have healed. Due to frailty, chronic steroids and frequent falls.  No PTX/flail chest.   - Norco for pain control - Incentive spirometry strongly encouraged   Orthostatic hypotension: Contributing to pt's fall risk. BPs otherwise at target range.  - Stress steroids as above.  - Continue telemetry.  HTN: Chronic, stable. - Continue bisoprolol  RA: Chronic, stable.  - Continue steroids - Hold leflunomide  T2DM: Diet-controlled, HbA1c 5% - Will monitor glucose w/AM labs.   Moderate malnutrition:  - RD consulted, supplement ordered. - Remeron as below  Hypocalcemia: Improved. - Will start vitamin D after checking level (pending).  Hyperbilirubinemia: Mild, LFTs otherwise normal, suspect due to sepsis. Resolved.   Depression: Chronic, stable.  - Continue wellbutrin, remeron, nortriptyline  S/p TAVR: No acute issues noted.   DVT prophylaxis: Lovenox Code Status: Full Family Communication: None at bedside Disposition Plan: PT recommending SNF. CSW consulted. Suspect she would be stable for discharge in the next 24 hrs.    Consultants:   None  Procedures:   None  Antimicrobials:  Vancomycin 1/10 - 1/11  Meropenem 1/10 - 1/12  Levaquin    Subjective: Dizzy when working with PT and VS's confirm orthostasis. Cough is stable, hurts to take deep breaths but knows she needs to do this to improve lung aeration. No fever.   Objective: Vitals:   10/31/17 2217 11/01/17 0551 11/01/17 1119 11/01/17 1120  BP: (!) 122/42 (!) 149/64 (!) 95/59 (!) 89/45  Pulse: 77 91    Resp: 18     Temp: 97.9 F (36.6 C)     TempSrc: Oral     SpO2: 95% 95%    Weight:      Height:  Intake/Output Summary (Last 24 hours) at 11/01/2017 1329 Last data filed at 10/31/2017 1724 Gross per 24 hour  Intake 230 ml  Output -  Net 230 ml   Filed Weights   10/30/17 1758 10/31/17 0200  Weight: 53.1 kg (117 lb) 49.3 kg (108 lb 11 oz)    Gen: Frail, pleasant female in no distress  Pulm: Non-labored breathing, shallow tachypnea. Diminished at left base.    CV: Regular rate and rhythm. No murmur, rub, or gallop. No JVD, no pedal edema. GI: Abdomen soft, non-tender, non-distended, with normoactive bowel sounds. No organomegaly or masses felt. MSK: Callus formation on left ribs is palpable, tender, light ecchymosis on left anterior lower ribs without visible or palpable deformities. Unchanged.  Skin: As above, otherwise no wounds Neuro: Alert and oriented. No focal neurological deficits. Psych: Judgement and insight appear normal. Mood & affect appropriate.  CBC: Recent Labs  Lab 10/30/17 1848 10/30/17 1904 11/01/17 0600  WBC 19.4*  --  13.4*  HGB 12.7 13.6 10.9*  HCT 38.5 40.0 32.7*  MCV 94.8  --  94.0  PLT 167  --  140*   Basic Metabolic Panel: Recent Labs  Lab 10/30/17 1848 10/30/17 1904 10/31/17 0612 11/01/17 0600  NA 132* 133*  --  137  K 4.6 4.5  --  4.2  CL 98* 100*  --  103  CO2 19*  --   --  25  GLUCOSE 80 78  --  129*  BUN 25* 24*  --  16  CREATININE 1.31* 1.20* 0.92 0.65  CALCIUM 8.8*  --   --  9.5   GFR: Estimated Creatinine Clearance: 43.7 mL/min (by C-G formula based on SCr of 0.65 mg/dL).   Liver Function Tests: Recent Labs  Lab 10/30/17 1848 11/01/17 0600  AST 18 14*  ALT 15 11*  ALKPHOS 75 65  BILITOT 2.1* 0.8  PROT 6.5 5.7*  ALBUMIN 3.3* 2.6*   HbA1C: Recent Labs    11/01/17 0600  HGBA1C 5.0   CBG: Recent Labs  Lab 10/31/17 1143 10/31/17 1633 10/31/17 2301 11/01/17 0751 11/01/17 1219  GLUCAP 126* 178* 162* 118* 165*   Urine analysis:    Component Value Date/Time   COLORURINE YELLOW 10/30/2017 2116   APPEARANCEUR CLEAR 10/30/2017 2116   LABSPEC 1.016 10/30/2017 2116   PHURINE 6.0 10/30/2017 2116   GLUCOSEU NEGATIVE 10/30/2017 2116   HGBUR MODERATE (A) 10/30/2017 2116   BILIRUBINUR NEGATIVE 10/30/2017 2116   KETONESUR 80 (A) 10/30/2017 2116   PROTEINUR 100 (A) 10/30/2017 2116   UROBILINOGEN 0.2 03/21/2015 0236   NITRITE NEGATIVE 10/30/2017 2116   LEUKOCYTESUR SMALL (A)  10/30/2017 2116   Recent Results (from the past 240 hour(s))  Blood culture (routine x 2)     Status: None (Preliminary result)   Collection Time: 10/30/17  6:48 PM  Result Value Ref Range Status   Specimen Description BLOOD LEFT ANTECUBITAL  Final   Special Requests   Final    BOTTLES DRAWN AEROBIC AND ANAEROBIC Blood Culture adequate volume   Culture   Final    NO GROWTH 2 DAYS Performed at [facility], [address] [place], [place] [address]    Report Status PENDING  Incomplete  Blood culture (routine x 2)     Status: None (Preliminary result)   Collection Time: 10/30/17  7:00 PM  Result Value Ref Range Status   Specimen Description BLOOD LEFT FOREARM  Final   Special Requests   Final  BOTTLES DRAWN AEROBIC AND ANAEROBIC Blood Culture adequate volume   Culture   Final    NO GROWTH 2 DAYS Performed at [facility], [address], [place], [place] [address]    Report Status PENDING  Incomplete  Culture, Urine     Status: Abnormal   Collection Time: 10/30/17  9:53 PM  Result Value Ref Range Status   Specimen Description URINE, RANDOM  Final   Special Requests NONE  Final   Culture (A)  Final    <10,000 COLONIES/mL INSIGNIFICANT GROWTH Performed at [facility], [address], [place], [place] [address]    Report Status 11/01/2017 FINAL  Final  Culture, sputum-assessment     Status: None   Collection Time: 10/31/17  1:27 AM  Result Value Ref Range Status   Specimen Description SPUTUM  Final   Special Requests NONE  Final   Sputum evaluation   Final    Sputum specimen not acceptable for testing.  Please recollect.   Gram Stain Report Called to,Read Back By and Verified With: DEBBIE,RN 5 EAST @0241  10/31/17 MKELLY   Report Status 10/31/2017 FINAL  Final  MRSA PCR Screening     Status: None   Collection Time: 10/31/17  8:00 AM  Result Value Ref Range Status   MRSA by PCR NEGATIVE NEGATIVE Final    Comment:        The GeneXpert MRSA Assay  (FDA approved for NASAL specimens only), is one component of a comprehensive MRSA colonization surveillance program. It is not intended to diagnose MRSA infection nor to guide or monitor treatment for MRSA infections.       Radiology Studies: Dg Chest 2 View  Result Date: 10/30/2017 CLINICAL DATA:  Status post possible fall with injury to the ribs. EXAM: CHEST  2 VIEW COMPARISON:  10/19/2017 FINDINGS: Prior sending aortic stent graft repair. Cardiomediastinal silhouette is normal. Mediastinal contours appear intact. Calcific atherosclerotic disease of the aorta. Stable elevation of the left hemidiaphragm. There is no evidence of focal airspace consolidation, pleural effusion or pneumothorax. Osseous structures are without acute abnormality. Thoracic kyphosis with stable anterior compression deformity of T6 vertebral body. Minimally displaced left fourth posterior rib fracture. Soft tissues are grossly normal. IMPRESSION: Minimally displaced left 4th posterior rib fracture. No evidence of pneumothorax. Electronically Signed   By: Ted Mcalpine M.D.   On: 10/30/2017 20:01   Ct Head Wo Contrast  Result Date: 10/30/2017 CLINICAL DATA:  Status post fall. EXAM: CT HEAD WITHOUT CONTRAST TECHNIQUE: Contiguous axial images were obtained from the base of the skull through the vertex without intravenous contrast. COMPARISON:  10/14/2015 FINDINGS: Brain: No evidence of acute infarction, hemorrhage, hydrocephalus, extra-axial collection or mass lesion/mass effect. Moderate brain parenchymal volume loss and deep white matter microangiopathy. Chronic lacunar infarct of the right thalamus. Vascular: Atherosclerotic calcifications at the intra cavernous carotid arteries. Skull: Normal. Negative for fracture or focal lesion. Sinuses/Orbits: No acute finding. Other: None. IMPRESSION: No evidence of acute traumatic injury to head. Moderate brain parenchymal atrophy and chronic microvascular disease. Chronic  right thalamic lacunar infarct. Electronically Signed   By: Ted Mcalpine M.D.   On: 10/30/2017 20:41   Ct Chest Wo Contrast  Result Date: 10/30/2017 CLINICAL DATA:  Fall with rib pain. EXAM: CT CHEST WITHOUT CONTRAST TECHNIQUE: Multidetector CT imaging of the chest was performed following the standard protocol without IV contrast. COMPARISON:  05/27/2015 FINDINGS: Cardiovascular: Heart size upper normal to mildly enlarged. No pericardial effusion. Patient is status post  aortic valve replacement. Coronary artery calcification is evident. Atherosclerotic calcification is noted in the wall of the thoracic aorta. Prominence of the main pulmonary artery raises the question of pulmonary arterial hypertension. Mediastinum/Nodes: No mediastinal lymphadenopathy. No evidence for gross hilar lymphadenopathy although assessment is limited by the lack of intravenous contrast on today's study. Lungs/Pleura: Evaluation of lung parenchyma is obscured scratchy at fine detail of lung parenchyma is obscured by patient breathing motion during image acquisition. Probable emphysema with some interlobular septal thickening in the lung apices. Patchy atelectasis or pneumonia noted posterior left lower lobe. No pneumothorax. No pleural effusion. Upper Abdomen: Unremarkable. Musculoskeletal: Multiple left-sided rib fractures of varying age are identified. Anterior old fractures of lower left ribs evident. Fractures of the posterior tenth, eleventh, and twelfth ribs on the left are nonacute as there is evidence of bony callus, but these are not yet healed. Old anterior right rib fractures identified in the lower ribs but incompletely visualized. IMPRESSION: 1. Posterior fractures of the left tenth, eleventh, and twelfth ribs are at least subacute as bony callus is evident but the fracture lines are still visible consistent with incomplete healing. 2. Old healed fractures are identified in the anterior lower ribs bilaterally. 3. No  pneumothorax or pleural effusion. 4. Mild patchy airspace opacity posterior left lower lobe compatible with atelectasis or pneumonia. 5.  Aortic Atherosclerois (ICD10-170.0) 6. Question pulmonary arterial hypertension. Electronically Signed   By: Kennith Center M.D.   On: 10/30/2017 20:44    Scheduled Meds: . ALPRAZolam  0.5 mg Oral QHS  . bisoprolol  2.5 mg Oral Daily  . buPROPion  100 mg Oral q morning - 10a  . enoxaparin (LOVENOX) injection  30 mg Subcutaneous Q24H  . feeding supplement (ENSURE ENLIVE)  237 mL Oral TID BM  . insulin aspart  0-9 Units Subcutaneous TID WC  . latanoprost  1 drop Both Eyes QHS  . mirtazapine  30 mg Oral QHS  . nortriptyline  75 mg Oral QHS  . predniSONE  15 mg Oral Q breakfast  . vitamin B-12  1,000 mcg Oral Daily  . Vitamin D (Ergocalciferol)  50,000 Units Oral Q7 days   Continuous Infusions: . meropenem (MERREM) IV Stopped (11/01/17 0937)     LOS: 2 days   Time spent: 25 minutes.  Hazeline Junker, MD [facility] Pager [phone]  If 7PM-7AM, please contact night-coverage [email or web address] Password [facility] 11/01/2017, 1:29 PM

## 2017-11-01 NOTE — Evaluation (Signed)
Physical Therapy Evaluation Patient Details Name: [name] MRN: [id number] DOB: 1935/07/24 Today's Date: 11/01/2017   History of Present Illness  Pt admitted through ED with sepsis 2* HCAP and with hx of falls at home and with L ribs 10-12 fx  Clinical Impression  Pt admitted as above and presenting with functional mobility limitations 2* generalized weakness, balance deficits and rib pain.  Pt noted to be orthostatic with attempts to mobilize sitting 95/59, standing 89/45, after moving to sitting  77/44 and 2 min after reclining 118/59 RN aware.  Pt would benefit from follow up rehab at SNF level to maximize IND and safety.    Follow Up Recommendations SNF    Equipment Recommendations  None recommended by PT    Recommendations for Other Services       Precautions / Restrictions Precautions Precautions: Fall Restrictions Weight Bearing Restrictions: No      Mobility  Bed Mobility Overal bed mobility: Needs Assistance Bed Mobility: Supine to Sit     Supine to sit: Mod assist     General bed mobility comments: Increased time with cues for sequence and assist to complete transition to EOB  Transfers Overall transfer level: Needs assistance Equipment used: Rolling walker (2 wheeled) Transfers: Sit to/from Stand Sit to Stand: Min assist;Mod assist         General transfer comment: cues for transition position and use of UEs to self assist.  Physical assist to bring wt up and fwd and to balance in initial standing.`  Ambulation/Gait Ambulation/Gait assistance: Min assist;+2 physical assistance;+2 safety/equipment Ambulation Distance (Feet): 5 Feet Assistive device: Rolling walker (2 wheeled) Gait Pattern/deviations: Step-to pattern;Decreased step length - right;Decreased step length - left;Shuffle;Trunk flexed Gait velocity: decr Gait velocity interpretation: Below normal speed for age/gender General Gait Details: Assist to stabilize throughout distance. Cues  for safety, distance from RW.   Stairs            Wheelchair Mobility    Modified Rankin (Stroke Patients Only)       Balance Overall balance assessment: Needs assistance Sitting-balance support: Feet supported Sitting balance-Leahy Scale: Fair     Standing balance support: Bilateral upper extremity supported Standing balance-Leahy Scale: Poor                               Pertinent Vitals/Pain Pain Assessment: Faces Faces Pain Scale: Hurts little more Pain Location: R and L ribs Pain Descriptors / Indicators: Grimacing;Guarding Pain Intervention(s): Limited activity within patient's tolerance;Monitored during session;Premedicated before session    Home Living Family/patient expects to be discharged to:: Private residence Living Arrangements: Alone Available Help at Discharge: Family Type of Home: House Home Access: Stairs to enter Entrance Stairs-Rails: Doctor, general practice of Steps: 2 Home Layout: One level Home Equipment: Environmental consultant - 2 wheels;Walker - 4 wheels;Shower seat;Bedside commode      Prior Function Level of Independence: Independent with assistive device(s)         Comments: Uses RW.  Pt reports she doesn't drive.  Daughter assists with groceries      Hand Dominance   Dominant Hand: Right    Extremity/Trunk Assessment   Upper Extremity Assessment Upper Extremity Assessment: Generalized weakness;LUE deficits/detail LUE Deficits / Details: ltd use of shoulder 28 failed rotator cuff surgery    Lower Extremity Assessment Lower Extremity Assessment: Generalized weakness       Communication   Communication: Expressive difficulties  Cognition Arousal/Alertness: Awake/alert Behavior During  Therapy: WFL for tasks assessed/performed Overall Cognitive Status: History of cognitive impairments - at baseline                                        General Comments      Exercises     Assessment/Plan     PT Assessment Patient needs continued PT services  PT Problem List Decreased strength;Decreased range of motion;Decreased activity tolerance;Decreased balance;Decreased mobility;Decreased knowledge of use of DME;Decreased cognition;Pain       PT Treatment Interventions DME instruction;Gait training;Functional mobility training;Therapeutic activities;Balance training;Patient/family education;Therapeutic exercise    PT Goals (Current goals can be found in the Care Plan section)  Acute Rehab PT Goals Patient Stated Goal: did not state  PT Goal Formulation: With patient Time For Goal Achievement: 11/15/17 Potential to Achieve Goals: Good    Frequency Min 3X/week   Barriers to discharge Decreased caregiver support Pt lives alone    Co-evaluation               AM-PAC PT "6 Clicks" Daily Activity  Outcome Measure Difficulty turning over in bed (including adjusting bedclothes, sheets and blankets)?: A Lot Difficulty moving from lying on back to sitting on the side of the bed? : Unable Difficulty sitting down on and standing up from a chair with arms (e.g., wheelchair, bedside commode, etc,.)?: Unable Help needed moving to and from a bed to chair (including a wheelchair)?: A Lot Help needed walking in hospital room?: A Lot Help needed climbing 3-5 steps with a railing? : A Lot 6 Click Score: 10    End of Session   Activity Tolerance: Patient limited by fatigue;Other (comment)(orthostatic) Patient left: in chair;with call bell/phone within reach;with chair alarm set Nurse Communication: Mobility status PT Visit Diagnosis: Muscle weakness (generalized) (M62.81);Difficulty in walking, not elsewhere classified (R26.2)    Time: 3976-7341 PT Time Calculation (min) (ACUTE ONLY): 26 min   Charges:   PT Evaluation $PT Eval Low Complexity: 1 Low PT Treatments $Gait Training: 8-22 mins   PT G Codes:        Pg [phone]   [name] 11/01/2017, 1:05 PM

## 2017-11-02 LAB — BASIC METABOLIC PANEL
ANION GAP: 8 (ref 5–15)
BUN: 15 mg/dL (ref 6–20)
CO2: 25 mmol/L (ref 22–32)
CREATININE: 0.75 mg/dL (ref 0.44–1.00)
Calcium: 9.5 mg/dL (ref 8.9–10.3)
Chloride: 104 mmol/L (ref 101–111)
GFR calc Af Amer: >60 mL/min (ref >60)
GLUCOSE: 82 mg/dL (ref 65–99)
Potassium: 3.9 mmol/L (ref 3.5–5.1)
Sodium: 137 mmol/L (ref 135–145)

## 2017-11-02 LAB — CBC
HCT: 34.1 % — ABNORMAL LOW (ref 36.0–46.0)
Hemoglobin: 11.1 g/dL — ABNORMAL LOW (ref 12.0–15.0)
MCH: 30.6 pg (ref 26.0–34.0)
MCHC: 32.6 g/dL (ref 30.0–36.0)
MCV: 93.9 fL (ref 78.0–100.0)
PLATELETS: 141 10*3/uL — AB (ref 150–400)
RBC: 3.63 MIL/uL — ABNORMAL LOW (ref 3.87–5.11)
RDW: 14.3 % (ref 11.5–15.5)
WBC: 12.6 10*3/uL — ABNORMAL HIGH (ref 4.0–10.5)

## 2017-11-02 MED ORDER — LEVOFLOXACIN 500 MG PO TABS: 500.0000 mg | ORAL_TABLET | Freq: Every day | ORAL | 0 refills | Status: AC

## 2017-11-02 MED ORDER — HYDROCODONE-ACETAMINOPHEN 5-325 MG PO TABS: 1.0000 | ORAL_TABLET | Freq: Four times a day (QID) | ORAL | 0 refills | Status: DC | PRN

## 2017-11-02 MED ORDER — PREDNISONE 20 MG PO TABS: 20.0000 mg | ORAL_TABLET | Freq: Every day | ORAL | Status: DC

## 2017-11-02 MED ORDER — LEFLUNOMIDE 20 MG PO TABS: 20.0000 mg | ORAL_TABLET | Freq: Every day | ORAL | Status: AC

## 2017-11-02 MED ORDER — ALPRAZOLAM 1 MG PO TABS: 0.5000 mg | ORAL_TABLET | Freq: Every day | ORAL | 0 refills | Status: DC

## 2017-11-02 NOTE — Discharge Summary (Signed)
Physician Discharge Summary  [name] TOI:[id number] DOB: [birth year] DOA: 10/30/2017  PCP: Jarome Matin, MD  Admit date: 10/30/2017 Discharge date: 11/02/2017  Admitted From: Home Disposition: SNF   Recommendations for Outpatient Follow-up:  1. Follow up with PCP in 1-2 weeks 2. Will need taper of stress steroids provided during this admission (discharge on prednisone 20mg  daily) back to home dose of 5mg  daily.  3. Monitor orthostatic vital signs.   Home Health: N/A Equipment/Devices: Per SNF Discharge Condition: Stable CODE STATUS: Full Diet recommendation: Heart healthy-carb-modified  Brief/Interim Summary: [name] a 83 y.o.femalewith medical history significant ofsevere AS s/p TAVR, RA on chronic steroids, iatrogenic adrenal insufficiency, memory deficit (likely early dementia), pancreatic insufficiency, prior ESBL e.coli UTIs. Patient was actually admitted for ESBL e.coli UTI on 12/30-1/2, got 3 days of merrem, switched to bactrim and discharged.  Since discharge has been weak generally at home. Had at least 3 falls in the past week. Becomes very dizzy every time she tries to stand up. Has also developed an associated productive, painful cough. Decreased appetite.  In the ED, LLL PNA and L 10th-12th rib Fx that appear sub-acute on CT scan. Meropenem and vancomycin started for HCAP with improvement in cough, improvement in leukocytosis. Stress steroids were provided with improvement on orthostatic symptoms. She is breathing at baseline. Antibiotics changed to levaquin with continued improvement. She will require rehabilitation prior to returning to home.   Discharge Diagnoses:  Principal Problem:   HCAP (healthcare-associated pneumonia) Active Problems:   Rheumatoid arthritis involving multiple joints (HCC)   DM2 (diabetes mellitus, type 2) (HCC)   Chronic adrenal insufficiency (HCC)   Sepsis (HCC)   S/P TAVR (transcatheter aortic valve  replacement)   Fracture of three ribs, left, closed, initial encounter  Sepsis due to LLL HCAP: Complication of atelectasis from splinting due to rib fractures.  - Continues to have resolution of leukocytosis on levaquin, will continue this for 7 days.  - Monitor blood cultures (NGTD). Sputum culture not acceptable.  - Reinforced need for diligent use of incentive spirometry.   Pyuria: With history of ESBL UTI, urine culture with < 10k colonies. No symptoms.   Chronic adrenal insufficiency:  - Increased prednisone to 20mg  given ongoing orthostatic hypotension. Formerly on 5mg  daily dose, monitor BP closely and taper as tolerated.  Subacute rib fractures: Posterior left 10-12th w/callus noted still w/fracture lines indicating incomplete healing. Also bilateral anterior lower fractures have healed. Due to frailty, chronic steroids and frequent falls. No PTX/flail chest.   - Norco for pain control - Incentive spirometry strongly encouraged   Orthostatic hypotension: Contributing to pt's fall risk. BPs otherwise at target range.  - Stress steroids as above.   HTN: Chronic, stable. - Continue bisoprolol  RA: Chronic, stable.  - Continue steroids - Hold leflunomide during infection.   T2DM: Diet-controlled, HbA1c 5% - Monitor  Moderate malnutrition:  - RD consulted, supplement ordered. - Remeron as below  Hypocalcemia: Improved. - Vitamin D 50k units given once on 1/11. Vitamin D level checked and is pending.  Hyperbilirubinemia: Mild, LFTs otherwise normal, suspect due to sepsis. Resolved.   Depression: Chronic, stable.  - Continue wellbutrin, remeron, nortriptyline  S/p TAVR: No acute issues noted.   Discharge Instructions Discharge Instructions    Diet - low sodium heart healthy   Complete by:  As directed    Diet Carb Modified   Complete by:  As directed    Increase activity slowly   Complete by:  As directed  Allergies as of 11/02/2017       Reactions   Adhesive [tape] Other (See Comments)   Skin peels up.    Penicillins Hives   Has patient had a PCN reaction causing immediate rash, facial/tongue/throat swelling, SOB or lightheadedness with hypotension: No Has patient had a PCN reaction causing severe rash involving mucus membranes or skin necrosis: No Has patient had a PCN reaction that required hospitalization No Has patient had a PCN reaction occurring within the last 10 years: No If all of the above answers are "NO", then may proceed with Cephalosporin use.      Medication List    TAKE these medications   ALPRAZolam 1 MG tablet Commonly known as:  XANAX Take 0.5 tablets (0.5 mg total) by mouth at bedtime. Anxiety   bisoprolol 5 MG tablet Commonly known as:  ZEBETA Take 0.5 tablets (2.5 mg total) by mouth daily.   buPROPion 100 MG tablet Commonly known as:  WELLBUTRIN Take 100 mg by mouth every morning.   HYDROcodone-acetaminophen 5-325 MG tablet Commonly known as:  NORCO/VICODIN Take 1 tablet by mouth every 6 (six) hours as needed for moderate pain.   leflunomide 20 MG tablet Commonly known as:  ARAVA Take 1 tablet (20 mg total) by mouth daily. Start taking on:  11/10/2017 What changed:  These instructions start on 11/10/2017. If you are unsure what to do until then, ask your doctor or other care provider.   levofloxacin 500 MG tablet Commonly known as:  LEVAQUIN Take 1 tablet (500 mg total) by mouth daily for 7 days.   mirtazapine 30 MG tablet Commonly known as:  REMERON Take 30 mg by mouth at bedtime.   nortriptyline 75 MG capsule Commonly known as:  PAMELOR Take 75 mg by mouth at bedtime.   predniSONE 20 MG tablet Commonly known as:  DELTASONE Take 1 tablet (20 mg total) by mouth daily with breakfast. Resume in 4days What changed:    medication strength  how much to take   TRAVATAN Z 0.004 % Soln ophthalmic solution Generic drug:  Travoprost (BAK Free) Place 1 drop into both eyes at  bedtime.   vitamin B-12 1000 MCG tablet Commonly known as:  CYANOCOBALAMIN Take 1 tablet (1,000 mcg total) by mouth daily.      Follow-up Information    Jarome Matin, MD Follow up.   Specialty:  Internal Medicine Contact information: [address] [place] [address] [phone]          Allergies  Allergen Reactions  . Adhesive [Tape] Other (See Comments)    Skin peels up.   Marland Kitchen Penicillins Hives    Has patient had a PCN reaction causing immediate rash, facial/tongue/throat swelling, SOB or lightheadedness with hypotension: No Has patient had a PCN reaction causing severe rash involving mucus membranes or skin necrosis: No Has patient had a PCN reaction that required hospitalization No Has patient had a PCN reaction occurring within the last 10 years: No If all of the above answers are "NO", then may proceed with Cephalosporin use.     Consultations:  None  Procedures/Studies: Dg Chest 2 View  Result Date: 10/30/2017 CLINICAL DATA:  Status post possible fall with injury to the ribs. EXAM: CHEST  2 VIEW COMPARISON:  10/19/2017 FINDINGS: Prior sending aortic stent graft repair. Cardiomediastinal silhouette is normal. Mediastinal contours appear intact. Calcific atherosclerotic disease of the aorta. Stable elevation of the left hemidiaphragm. There is no evidence of focal airspace consolidation, pleural effusion or pneumothorax. Osseous structures  are without acute abnormality. Thoracic kyphosis with stable anterior compression deformity of T6 vertebral body. Minimally displaced left fourth posterior rib fracture. Soft tissues are grossly normal. IMPRESSION: Minimally displaced left 4th posterior rib fracture. No evidence of pneumothorax. Electronically Signed   By: Ted Mcalpine M.D.   On: 10/30/2017 20:01   Dg Chest 2 View  Result Date: 10/19/2017 CLINICAL DATA:  Acute onset of altered mental status. Vomiting and diarrhea. Urinary incontinence. Fever. EXAM:  CHEST  2 VIEW COMPARISON:  Chest radiograph performed 10/03/2015 FINDINGS: The lungs are well-aerated and clear. There is no evidence of focal opacification, pleural effusion or pneumothorax. The cardiomediastinal silhouette is borderline normal in size. A vascular stent is noted overlying the aortic valve. No acute osseous abnormalities are seen. There is chronic superior subluxation of the left humeral head. IMPRESSION: No acute cardiopulmonary process seen. Electronically Signed   By: Roanna Raider M.D.   On: 10/19/2017 01:33   Ct Head Wo Contrast  Result Date: 10/30/2017 CLINICAL DATA:  Status post fall. EXAM: CT HEAD WITHOUT CONTRAST TECHNIQUE: Contiguous axial images were obtained from the base of the skull through the vertex without intravenous contrast. COMPARISON:  10/14/2015 FINDINGS: Brain: No evidence of acute infarction, hemorrhage, hydrocephalus, extra-axial collection or mass lesion/mass effect. Moderate brain parenchymal volume loss and deep white matter microangiopathy. Chronic lacunar infarct of the right thalamus. Vascular: Atherosclerotic calcifications at the intra cavernous carotid arteries. Skull: Normal. Negative for fracture or focal lesion. Sinuses/Orbits: No acute finding. Other: None. IMPRESSION: No evidence of acute traumatic injury to head. Moderate brain parenchymal atrophy and chronic microvascular disease. Chronic right thalamic lacunar infarct. Electronically Signed   By: Ted Mcalpine M.D.   On: 10/30/2017 20:41   Ct Chest Wo Contrast  Result Date: 10/30/2017 CLINICAL DATA:  Fall with rib pain. EXAM: CT CHEST WITHOUT CONTRAST TECHNIQUE: Multidetector CT imaging of the chest was performed following the standard protocol without IV contrast. COMPARISON:  05/27/2015 FINDINGS: Cardiovascular: Heart size upper normal to mildly enlarged. No pericardial effusion. Patient is status post aortic valve replacement. Coronary artery calcification is evident. Atherosclerotic  calcification is noted in the wall of the thoracic aorta. Prominence of the main pulmonary artery raises the question of pulmonary arterial hypertension. Mediastinum/Nodes: No mediastinal lymphadenopathy. No evidence for gross hilar lymphadenopathy although assessment is limited by the lack of intravenous contrast on today's study. Lungs/Pleura: Evaluation of lung parenchyma is obscured scratchy at fine detail of lung parenchyma is obscured by patient breathing motion during image acquisition. Probable emphysema with some interlobular septal thickening in the lung apices. Patchy atelectasis or pneumonia noted posterior left lower lobe. No pneumothorax. No pleural effusion. Upper Abdomen: Unremarkable. Musculoskeletal: Multiple left-sided rib fractures of varying age are identified. Anterior old fractures of lower left ribs evident. Fractures of the posterior tenth, eleventh, and twelfth ribs on the left are nonacute as there is evidence of bony callus, but these are not yet healed. Old anterior right rib fractures identified in the lower ribs but incompletely visualized. IMPRESSION: 1. Posterior fractures of the left tenth, eleventh, and twelfth ribs are at least subacute as bony callus is evident but the fracture lines are still visible consistent with incomplete healing. 2. Old healed fractures are identified in the anterior lower ribs bilaterally. 3. No pneumothorax or pleural effusion. 4. Mild patchy airspace opacity posterior left lower lobe compatible with atelectasis or pneumonia. 5.  Aortic Atherosclerois (ICD10-170.0) 6. Question pulmonary arterial hypertension. Electronically Signed   By: Jamison Oka.D.  On: 10/30/2017 20:44   Subjective: Wants to leave, but continues to feel very weak. Breathing at baseline, though still chest pain remains, improved with norco. No fever or chills. Continues to deny dysuria, or other urinary symptoms.  Discharge Exam: Vitals:   11/01/17 2101 11/02/17 0355  BP:  (!) 161/89 136/67  Pulse: 89 88  Resp: 18 18  Temp: 97.9 F (36.6 C) 98.2 F (36.8 C)  SpO2: 96% 95%   Gen: Frail, pleasant female in no distress  Pulm: Non-labored breathing, shallow tachypnea. Diminished at left base.   CV: Regular rate and rhythm. No murmur, rub, or gallop. No JVD, no pedal edema. GI: Abdomen soft, non-tender, non-distended, with normoactive bowel sounds. No organomegaly or masses felt. MSK: Callus formation on left ribs is palpable, tender, light ecchymosis on left anterior lower ribs without visible or palpable deformities. Unchanged.  Skin: As above, otherwise no wounds Neuro: Alert and oriented. No focal neurological deficits. Psych: Judgement and insight appear normal. Mood & affect appropriate.  Labs: Basic Metabolic Panel: Recent Labs  Lab 10/30/17 1848 10/30/17 1904 10/31/17 0612 11/01/17 0600 11/02/17 0641  NA 132* 133*  --  137 137  K 4.6 4.5  --  4.2 3.9  CL 98* 100*  --  103 104  CO2 19*  --   --  25 25  GLUCOSE 80 78  --  129* 82  BUN 25* 24*  --  16 15  CREATININE 1.31* 1.20* 0.92 0.65 0.75  CALCIUM 8.8*  --   --  9.5 9.5   Liver Function Tests: Recent Labs  Lab 10/30/17 1848 11/01/17 0600  AST 18 14*  ALT 15 11*  ALKPHOS 75 65  BILITOT 2.1* 0.8  PROT 6.5 5.7*  ALBUMIN 3.3* 2.6*   CBC: Recent Labs  Lab 10/30/17 1848 10/30/17 1904 11/01/17 0600 11/02/17 0641  WBC 19.4*  --  13.4* 12.6*  HGB 12.7 13.6 10.9* 11.1*  HCT 38.5 40.0 32.7* 34.1*  MCV 94.8  --  94.0 93.9  PLT 167  --  140* 141*   CBG: Recent Labs  Lab 10/31/17 2301 11/01/17 0751 11/01/17 1219 11/01/17 1645 11/01/17 2108  GLUCAP 162* 118* 165* 159* 111*   Hgb A1c Recent Labs    11/01/17 0600  HGBA1C 5.0   Urinalysis    Component Value Date/Time   COLORURINE YELLOW 10/30/2017 2116   APPEARANCEUR CLEAR 10/30/2017 2116   LABSPEC 1.016 10/30/2017 2116   PHURINE 6.0 10/30/2017 2116   GLUCOSEU NEGATIVE 10/30/2017 2116   HGBUR MODERATE (A) 10/30/2017  2116   BILIRUBINUR NEGATIVE 10/30/2017 2116   KETONESUR 80 (A) 10/30/2017 2116   PROTEINUR 100 (A) 10/30/2017 2116   UROBILINOGEN 0.2 03/21/2015 0236   NITRITE NEGATIVE 10/30/2017 2116   LEUKOCYTESUR SMALL (A) 10/30/2017 2116    Microbiology Recent Results (from the past 240 hour(s))  Blood culture (routine x 2)     Status: None (Preliminary result)   Collection Time: 10/30/17  6:48 PM  Result Value Ref Range Status   Specimen Description BLOOD LEFT ANTECUBITAL  Final   Special Requests   Final    BOTTLES DRAWN AEROBIC AND ANAEROBIC Blood Culture adequate volume   Culture   Final    NO GROWTH 2 DAYS Performed at [facility], [address], [place], [place] [address]    Report Status PENDING  Incomplete  Blood culture (routine x 2)     Status: None (Preliminary result)   Collection Time: 10/30/17  7:00  PM  Result Value Ref Range Status   Specimen Description BLOOD LEFT FOREARM  Final   Special Requests   Final    BOTTLES DRAWN AEROBIC AND ANAEROBIC Blood Culture adequate volume   Culture   Final    NO GROWTH 2 DAYS Performed at [facility], [address], [place], [place] [address]    Report Status PENDING  Incomplete  Culture, Urine     Status: Abnormal   Collection Time: 10/30/17  9:53 PM  Result Value Ref Range Status   Specimen Description URINE, RANDOM  Final   Special Requests NONE  Final   Culture (A)  Final    <10,000 COLONIES/mL INSIGNIFICANT GROWTH Performed at [facility], [address] [place], [place] [address]    Report Status 11/01/2017 FINAL  Final  Culture, sputum-assessment     Status: None   Collection Time: 10/31/17  1:27 AM  Result Value Ref Range Status   Specimen Description SPUTUM  Final   Special Requests NONE  Final   Sputum evaluation   Final    Sputum specimen not acceptable for testing.  Please recollect.   Gram Stain Report Called to,Read Back By and Verified With: DEBBIE,RN 5 EAST @0241  10/31/17 MKELLY   Report  Status 10/31/2017 FINAL  Final  MRSA PCR Screening     Status: None   Collection Time: 10/31/17  8:00 AM  Result Value Ref Range Status   MRSA by PCR NEGATIVE NEGATIVE Final    Comment:        The GeneXpert MRSA Assay (FDA approved for NASAL specimens only), is one component of a comprehensive MRSA colonization surveillance program. It is not intended to diagnose MRSA infection nor to guide or monitor treatment for MRSA infections.     Time coordinating discharge: Approximately 40 minutes  12/29/17, MD  [facility] 11/02/2017, 11:14 AM Pager [phone]

## 2017-11-02 NOTE — Progress Notes (Signed)
CSW following for discharge plan. Patient is a recent readmit, with full assessment completed on 10/21/17. CSW met with patient to discuss recommendation for SNF at discharge. Patient indicated that she wanted to go home, but seemed to be having some difficulty formulating her thoughts into words. CSW also contacted patient's children to discuss recommendation for SNF and see if any of them could talk her into being agreeable. CSW discussed with both [name] and Fritz Pickerel over the phone, presented information on SNF recommendation, and obtained consent to fax out referral.  CSW then met with patient and patient's son, Fritz Pickerel, at bedside to discuss recommendation for SNF and present bed offers. Patient acknowledged understanding of need for rehab, and is agreeable to going. Patient and patient's family would prefer [name] in [place]. CSW has sent out referral with no response. CSW contacted Admissions, and is awaiting a return call.  CSW will continue to follow. Facility will also need to initiate an insurance authorization request, which may not be received back today due to being a weekend.   Laveda Abbe, [place] Clinical Social Worker [phone]

## 2017-11-02 NOTE — Progress Notes (Signed)
CSW received call back from Admissions at [name] that they will review referral to look for admission tomorrow, as facility will need to obtain insurance authorization prior to admission and insurance will not approve over the weekend.  Patient has received no other bed offers at this time.  CSW alerted MD. CSW will continue to follow.  Blenda Nicely, [place] Clinical Social Worker [phone]

## 2017-11-02 NOTE — NC FL2 (Signed)
[place] MEDICAID FL2 LEVEL OF CARE SCREENING TOOL     IDENTIFICATION  Patient Name: [name] Birthdate: 09/17/1935 Sex: female Admission Date (Current Location): 10/30/2017  [facility] and [place] Number:  Producer, television/film/video and Address:  [facility],  [address] [place], [place] [address]      Provider Number: [id number]  Attending Physician Name and Address:  Tyrone Nine, MD  Relative Name and Phone Number:       Current Level of Care: Hospital Recommended Level of Care: Skilled Nursing Facility Prior Approval Number:    Date Approved/Denied:   PASRR Number: [id number]  Discharge Plan: SNF    Current Diagnoses: Patient Active Problem List   Diagnosis Date Noted  . HCAP (healthcare-associated pneumonia) 10/30/2017  . Fracture of three ribs, left, closed, initial encounter 10/30/2017  . Sepsis associated hypotension (HCC) 10/19/2017  . Elevated troponin 10/19/2017  . Dizziness 07/24/2016  . Dyspnea 12/06/2015  . Hypoxia 12/06/2015  . Hypokalemia 10/21/2015  . Hypophosphatemia 10/21/2015  . Hypomagnesemia 10/21/2015  . CAP (community acquired pneumonia) 10/20/2015  . B12 deficiency   . Malnutrition of moderate degree (HCC) 03/18/2015  . Altered mental state 03/17/2015  . Metabolic encephalopathy 09/12/2014    Class: Acute  . Sepsis secondary to UTI (HCC) 09/09/2014  . S/P TAVR (transcatheter aortic valve replacement) 03/16/2014  . Left rib fracture 11/17/2013  . Closed pelvic fracture (HCC) 11/17/2013  . Sepsis (HCC) 11/10/2013  . UTI (lower urinary tract infection) 11/09/2013  . Sepsis syndrome 09/17/2013  . COPD (chronic obstructive pulmonary disease) (HCC) 07/20/2013  . Chest pain 06/29/2013  . Hearing loss 03/25/2013  . Memory deficit 03/25/2013  . Depression 03/25/2013  . Syncope 03/25/2013  . Pulmonary fibrosis (HCC)   . Aortic stenosis, severe 02/26/2013  . Pancreatic insufficiency (HCC) 11/09/2012    Class: Chronic  . Low  back pain 01/16/2012    Class: Acute  . Gait instability 01/16/2012    Class: Acute  . Sinusitis, acute, sphenoidal 11/17/2011    Class: Acute  . Pneumonia 11/12/2011    Class: Acute  . Chronic adrenal insufficiency (HCC) 11/12/2011    Class: Chronic  . E. coli UTI (urinary tract infection) 10/01/2011  . Fever 10/01/2011  . Cough 10/01/2011  . Failure to thrive in adult 10/01/2011  . Weakness generalized 10/01/2011  . Rheumatoid arthritis involving multiple joints (HCC) 10/01/2011  . DM2 (diabetes mellitus, type 2) (HCC) 10/01/2011    Orientation RESPIRATION BLADDER Height & Weight     Self, Situation, Place  Normal Continent Weight: 108 lb 11 oz (49.3 kg) Height:  5\' 3"  (160 cm)  BEHAVIORAL SYMPTOMS/MOOD NEUROLOGICAL BOWEL NUTRITION STATUS      Continent Diet(carb modified)  AMBULATORY STATUS COMMUNICATION OF NEEDS Skin   Extensive Assist Verbally Normal                       Personal Care Assistance Level of Assistance  Bathing, Feeding, Dressing Bathing Assistance: Limited assistance Feeding assistance: Independent Dressing Assistance: Limited assistance     Functional Limitations Info  Sight, Hearing, Speech Sight Info: Adequate Hearing Info: Adequate Speech Info: Adequate    SPECIAL CARE FACTORS FREQUENCY  OT (By licensed OT), PT (By licensed PT)     PT Frequency: 5x/wk OT Frequency: 5x/wk            Contractures Contractures Info: Not present    Additional Factors Info  Code Status, Allergies, Isolation Precautions Code Status Info:  Full Allergies Info: Adhesive Tape, Penicillins     Isolation Precautions Info: Contact precautions, ESBL     Current Medications (11/02/2017):  This is the current hospital active medication list Current Facility-Administered Medications  Medication Dose Route Frequency Provider Last Rate Last Dose  . ALPRAZolam Prudy Feeler) tablet 0.5 mg  0.5 mg Oral QHS Lyda Perone M, DO   0.5 mg at 11/01/17 2051  .  bisoprolol (ZEBETA) tablet 2.5 mg  2.5 mg Oral Daily Lyda Perone M, DO   2.5 mg at 11/02/17 1035  . buPROPion [facility]) tablet 100 mg  100 mg Oral q morning - 10a Hillary Bow, DO   100 mg at 11/02/17 1035  . enoxaparin (LOVENOX) injection 30 mg  30 mg Subcutaneous Q24H Len Childs T, RPH   30 mg at 11/02/17 0834  . feeding supplement (ENSURE ENLIVE) (ENSURE ENLIVE) liquid 237 mL  237 mL Oral TID BM Tyrone Nine, MD   237 mL at 11/01/17 2052  . HYDROcodone-acetaminophen (NORCO/VICODIN) 5-325 MG per tablet 1 tablet  1 tablet Oral Q6H PRN Hillary Bow, DO   1 tablet at 11/02/17 0837  . latanoprost (XALATAN) 0.005 % ophthalmic solution 1 drop  1 drop Both Eyes QHS Lyda Perone M, DO   1 drop at 11/01/17 2051  . levofloxacin (LEVAQUIN) IVPB 500 mg  500 mg Intravenous Q24H AbsherKy Barban, RPH   Stopped at 11/01/17 1652  . lip balm (CARMEX) ointment   Topical PRN Hillary Bow, DO      . mirtazapine (REMERON) tablet 30 mg  30 mg Oral QHS Lyda Perone M, DO   30 mg at 11/01/17 2051  . nortriptyline (PAMELOR) capsule 75 mg  75 mg Oral QHS Lyda Perone M, DO   75 mg at 11/01/17 2050  . predniSONE (DELTASONE) tablet 20 mg  20 mg Oral Q breakfast Tyrone Nine, MD   20 mg at 11/02/17 0834  . vitamin B-12 (CYANOCOBALAMIN) tablet 1,000 mcg  1,000 mcg Oral Daily Lyda Perone M, DO   1,000 mcg at 11/02/17 1035  . Vitamin D (Ergocalciferol) (DRISDOL) capsule 50,000 Units  50,000 Units Oral Q7 days Tyrone Nine, MD   50,000 Units at 11/01/17 1057     Discharge Medications: Please see discharge summary for a list of discharge medications.  Relevant Imaging Results:  Relevant Lab Results:   Additional Information SS#: [id number]  Baldemar Lenis, LCSW

## 2017-11-03 LAB — BASIC METABOLIC PANEL
ANION GAP: 9 (ref 5–15)
BUN: 17 mg/dL (ref 6–20)
CALCIUM: 9.3 mg/dL (ref 8.9–10.3)
CHLORIDE: 99 mmol/L — AB (ref 101–111)
CO2: 29 mmol/L (ref 22–32)
CREATININE: 0.9 mg/dL (ref 0.44–1.00)
GFR calc non Af Amer: 59 mL/min — ABNORMAL LOW (ref >60)
Glucose, Bld: 89 mg/dL (ref 65–99)
POTASSIUM: 4.5 mmol/L (ref 3.5–5.1)
SODIUM: 137 mmol/L (ref 135–145)

## 2017-11-03 LAB — VITAMIN D 25 HYDROXY (VIT D DEFICIENCY, FRACTURES): Vit D, 25-Hydroxy: 50.3 ng/mL (ref 30.0–100.0)

## 2017-11-03 NOTE — Progress Notes (Signed)
Patient interviewed and examined this morning. No significant events, changes in exam, or changes to condition from yesterday. Plan remains for discharge when SNF bed is available.   Hazeline Junker, MD 11/03/2017 10:09 AM

## 2017-11-03 NOTE — Care Management Important Message (Addendum)
Important Message  Patient Details IM Letter given to Rhonda/Case Manager to present to the Patient Name: [name] MRN: [id number] Date of Birth: 1935-03-03   Medicare Important Message Given:  Yes    Caren Macadam 11/03/2017, 11:01 AMImportant Message  Patient Details  Name: [name] MRN: [id number] Date of Birth: [birth year]   Medicare Important Message Given:  Yes    Caren Macadam 11/03/2017, 11:01 AM

## 2017-11-03 NOTE — Progress Notes (Signed)
Date: November 03, 2017 Discharge orders review for case management needs.  None found  SNF admission s/p hospitalization. Marcelle Smiling, BSN, RN3, CCM:  [phone]

## 2017-11-03 NOTE — Progress Notes (Signed)
Report called to nurse, Trinna Post at [facility].  Patient transported via EMS.

## 2017-11-03 NOTE — Progress Notes (Signed)
LCSW following for SNF placement.  LCSW followed up with Judeth Cornfield at [facility].   LCSW waiting for confirmation.   LCSW will continue to follow.   Beulah Gandy [place] [facility] CSW [phone]

## 2017-11-03 NOTE — Clinical Social Work Placement (Addendum)
    3:42 PM Patient and family chose bed at [name] Room 405B  Patient will transport by [facility].  LCSW confirmed bed with facility.  LCSW faxed DC docs via HUB.  LCSW notified family.  RN report  #:  [phone]  CLINICAL SOCIAL WORK PLACEMENT  NOTE  Date:  11/03/2017  Patient Details  Name: [name] MRN: [id number] Date of Birth: [birth year]  Clinical Social Work is seeking post-discharge placement for this patient at the Skilled  Nursing Facility level of care (*CSW will initial, date and re-position this form in  chart as items are completed):  Yes   Patient/family provided with [facility] Clinical Social Work Department's list of facilities offering this level of care within the geographic area requested by the patient (or if unable, by the patient's family).  Yes   Patient/family informed of their freedom to choose among providers that offer the needed level of care, that participate in Medicare, Medicaid or managed care program needed by the patient, have an available bed and are willing to accept the patient.  Yes   Patient/family informed of [facility]'s ownership interest in [facility] and [facility], as well as of the fact that they are under no obligation to receive care at these facilities.  PASRR submitted to EDS on       PASRR number received on 11/02/17     Existing PASRR number confirmed on       FL2 transmitted to all facilities in geographic area requested by pt/family on 11/02/17     FL2 transmitted to all facilities within larger geographic area on       Patient informed that his/her managed care company has contracts with or will negotiate with certain facilities, including the following:        Yes   Patient/family informed of bed offers received.  Patient chooses bed at [name] [place]     Physician recommends and patient chooses bed at [name] [place]    Patient to be transferred to [name] [place] on  11/03/17.  Patient to be transferred to facility by EMS     Patient family notified on 11/03/17 of transfer.  Name of family member notified:  Olegario Messier a nd LArry, Son and Daughter     PHYSICIAN       Additional Comment:    _______________________________________________ Coralyn Helling, LCSW 11/03/2017, 3:42 PM

## 2017-11-04 LAB — CULTURE, BLOOD (ROUTINE X 2)
CULTURE: NO GROWTH
Culture: NO GROWTH
Special Requests: ADEQUATE
Special Requests: ADEQUATE

## 2017-11-20 ENCOUNTER — Encounter (HOSPITAL_COMMUNITY): Payer: Self-pay | Admitting: Emergency Medicine

## 2017-11-20 ENCOUNTER — Emergency Department (HOSPITAL_COMMUNITY): Payer: Medicare Other

## 2017-11-20 ENCOUNTER — Inpatient Hospital Stay (HOSPITAL_COMMUNITY)
Admission: EM | Admit: 2017-11-20 | Discharge: 2017-11-24 | DRG: 864 | Disposition: A | Discharge status: Skilled Nursing Facility | Payer: Medicare Other | Attending: Family Medicine | Admitting: Family Medicine

## 2017-11-20 DIAGNOSIS — E119 Type 2 diabetes mellitus without complications: Secondary | ICD-10-CM

## 2017-11-20 DIAGNOSIS — Z952 Presence of prosthetic heart valve: Secondary | ICD-10-CM

## 2017-11-20 DIAGNOSIS — R6510 Systemic inflammatory response syndrome (SIRS) of non-infectious origin without acute organ dysfunction: Secondary | ICD-10-CM | POA: Diagnosis not present

## 2017-11-20 DIAGNOSIS — R413 Other amnesia: Secondary | ICD-10-CM | POA: Diagnosis present

## 2017-11-20 DIAGNOSIS — Z880 Allergy status to penicillin: Secondary | ICD-10-CM

## 2017-11-20 DIAGNOSIS — E2740 Unspecified adrenocortical insufficiency: Secondary | ICD-10-CM | POA: Diagnosis not present

## 2017-11-20 DIAGNOSIS — E876 Hypokalemia: Secondary | ICD-10-CM | POA: Diagnosis not present

## 2017-11-20 DIAGNOSIS — Z91048 Other nonmedicinal substance allergy status: Secondary | ICD-10-CM

## 2017-11-20 DIAGNOSIS — J189 Pneumonia, unspecified organism: Secondary | ICD-10-CM

## 2017-11-20 DIAGNOSIS — I10 Essential (primary) hypertension: Secondary | ICD-10-CM | POA: Diagnosis present

## 2017-11-20 DIAGNOSIS — R402252 Coma scale, best verbal response, oriented, at arrival to emergency department: Secondary | ICD-10-CM | POA: Diagnosis present

## 2017-11-20 DIAGNOSIS — F419 Anxiety disorder, unspecified: Secondary | ICD-10-CM | POA: Diagnosis present

## 2017-11-20 DIAGNOSIS — Z7952 Long term (current) use of systemic steroids: Secondary | ICD-10-CM

## 2017-11-20 DIAGNOSIS — J449 Chronic obstructive pulmonary disease, unspecified: Secondary | ICD-10-CM | POA: Diagnosis present

## 2017-11-20 DIAGNOSIS — H9190 Unspecified hearing loss, unspecified ear: Secondary | ICD-10-CM | POA: Diagnosis present

## 2017-11-20 DIAGNOSIS — E860 Dehydration: Secondary | ICD-10-CM | POA: Diagnosis present

## 2017-11-20 DIAGNOSIS — K8689 Other specified diseases of pancreas: Secondary | ICD-10-CM | POA: Diagnosis present

## 2017-11-20 DIAGNOSIS — E11649 Type 2 diabetes mellitus with hypoglycemia without coma: Secondary | ICD-10-CM | POA: Diagnosis present

## 2017-11-20 DIAGNOSIS — R402362 Coma scale, best motor response, obeys commands, at arrival to emergency department: Secondary | ICD-10-CM | POA: Diagnosis present

## 2017-11-20 DIAGNOSIS — R531 Weakness: Secondary | ICD-10-CM | POA: Diagnosis not present

## 2017-11-20 DIAGNOSIS — F05 Delirium due to known physiological condition: Secondary | ICD-10-CM | POA: Diagnosis present

## 2017-11-20 DIAGNOSIS — F329 Major depressive disorder, single episode, unspecified: Secondary | ICD-10-CM | POA: Diagnosis present

## 2017-11-20 DIAGNOSIS — Z79899 Other long term (current) drug therapy: Secondary | ICD-10-CM

## 2017-11-20 DIAGNOSIS — M810 Age-related osteoporosis without current pathological fracture: Secondary | ICD-10-CM | POA: Diagnosis present

## 2017-11-20 DIAGNOSIS — I951 Orthostatic hypotension: Secondary | ICD-10-CM | POA: Diagnosis present

## 2017-11-20 DIAGNOSIS — R402142 Coma scale, eyes open, spontaneous, at arrival to emergency department: Secondary | ICD-10-CM | POA: Diagnosis present

## 2017-11-20 DIAGNOSIS — M069 Rheumatoid arthritis, unspecified: Secondary | ICD-10-CM | POA: Diagnosis present

## 2017-11-20 DIAGNOSIS — J8410 Pulmonary fibrosis, unspecified: Secondary | ICD-10-CM | POA: Diagnosis present

## 2017-11-20 LAB — CBC WITH DIFFERENTIAL/PLATELET
Basophils Absolute: 0 10*3/uL (ref 0.0–0.1)
Basophils Relative: 0 %
Eosinophils Absolute: 0 10*3/uL (ref 0.0–0.7)
Eosinophils Relative: 0 %
HEMATOCRIT: 38.9 % (ref 36.0–46.0)
HEMOGLOBIN: 12.9 g/dL (ref 12.0–15.0)
LYMPHS ABS: 0.8 10*3/uL (ref 0.7–4.0)
LYMPHS PCT: 5 %
MCH: 30.6 pg (ref 26.0–34.0)
MCHC: 33.2 g/dL (ref 30.0–36.0)
MCV: 92.4 fL (ref 78.0–100.0)
Monocytes Absolute: 0.4 10*3/uL (ref 0.1–1.0)
Monocytes Relative: 3 %
NEUTROS PCT: 92 %
Neutro Abs: 15.2 10*3/uL — ABNORMAL HIGH (ref 1.7–7.7)
Platelets: 127 10*3/uL — ABNORMAL LOW (ref 150–400)
RBC: 4.21 MIL/uL (ref 3.87–5.11)
RDW: 14.7 % (ref 11.5–15.5)
WBC: 16.5 10*3/uL — AB (ref 4.0–10.5)

## 2017-11-20 LAB — COMPREHENSIVE METABOLIC PANEL
ALT: 16 U/L (ref 14–54)
AST: 25 U/L (ref 15–41)
Albumin: 3.1 g/dL — ABNORMAL LOW (ref 3.5–5.0)
Alkaline Phosphatase: 89 U/L (ref 38–126)
Anion gap: 14 (ref 5–15)
BUN: 51 mg/dL — ABNORMAL HIGH (ref 6–20)
CO2: 27 mmol/L (ref 22–32)
Calcium: 8.1 mg/dL — ABNORMAL LOW (ref 8.9–10.3)
Chloride: 94 mmol/L — ABNORMAL LOW (ref 101–111)
Creatinine, Ser: 1.1 mg/dL — ABNORMAL HIGH (ref 0.44–1.00)
GFR, EST AFRICAN AMERICAN: 53 mL/min — AB (ref >60)
GFR, EST NON AFRICAN AMERICAN: 46 mL/min — AB (ref >60)
Glucose, Bld: 191 mg/dL — ABNORMAL HIGH (ref 65–99)
POTASSIUM: 3.7 mmol/L (ref 3.5–5.1)
Sodium: 135 mmol/L (ref 135–145)
Total Bilirubin: 1 mg/dL (ref 0.3–1.2)
Total Protein: 5.6 g/dL — ABNORMAL LOW (ref 6.5–8.1)

## 2017-11-20 LAB — I-STAT CG4 LACTIC ACID, ED: LACTIC ACID, VENOUS: 3.24 mmol/L — AB (ref 0.5–1.9)

## 2017-11-20 LAB — I-STAT TROPONIN, ED: Troponin i, poc: 0.03 ng/mL (ref 0.00–0.08)

## 2017-11-20 LAB — LACTIC ACID, PLASMA: Lactic Acid, Venous: 0.7 mmol/L (ref 0.5–1.9)

## 2017-11-20 LAB — TROPONIN I: Troponin I: <0.03 ng/mL (ref <0.03)

## 2017-11-20 LAB — CREATININE, SERUM: Creatinine, Ser: <0.3 mg/dL — ABNORMAL LOW (ref 0.44–1.00)

## 2017-11-20 LAB — PROCALCITONIN

## 2017-11-20 LAB — CBG MONITORING, ED: GLUCOSE-CAPILLARY: 108 mg/dL — AB (ref 65–99)

## 2017-11-20 MED ORDER — SODIUM CHLORIDE 0.9 % IV SOLN
1.0000 g | Freq: Two times a day (BID) | INTRAVENOUS | Status: DC
  Filled 2017-11-20: qty 1

## 2017-11-20 MED ORDER — SODIUM CHLORIDE 0.9 % IV SOLN
INTRAVENOUS | Status: DC
  Administered 2017-11-20: 23:00:00 via INTRAVENOUS

## 2017-11-20 MED ORDER — SODIUM CHLORIDE 0.9 % IV BOLUS (SEPSIS)
500.0000 mL | Freq: Once | INTRAVENOUS | Status: AC
  Administered 2017-11-20: 500 mL via INTRAVENOUS

## 2017-11-20 MED ORDER — SODIUM CHLORIDE 0.9 % IV SOLN
1000.0000 mL | INTRAVENOUS | Status: DC
  Administered 2017-11-20: 1000 mL via INTRAVENOUS

## 2017-11-20 MED ORDER — ACETAMINOPHEN 650 MG RE SUPP: 650.0000 mg | Freq: Four times a day (QID) | RECTAL | Status: DC | PRN

## 2017-11-20 MED ORDER — ENOXAPARIN SODIUM 30 MG/0.3ML ~~LOC~~ SOLN
30.0000 mg | Freq: Every day | SUBCUTANEOUS | Status: DC
  Administered 2017-11-21 (×2): 30 mg via SUBCUTANEOUS
  Filled 2017-11-20 (×2): qty 0.3

## 2017-11-20 MED ORDER — SENNOSIDES-DOCUSATE SODIUM 8.6-50 MG PO TABS
1.0000 | ORAL_TABLET | Freq: Every evening | ORAL | Status: DC | PRN
  Administered 2017-11-21: 1 via ORAL
  Filled 2017-11-20: qty 1

## 2017-11-20 MED ORDER — AZTREONAM IN DEXTROSE 2 GM/50ML IV SOLN
2.0000 g | Freq: Once | INTRAVENOUS | Status: DC
  Filled 2017-11-20: qty 50

## 2017-11-20 MED ORDER — HYDROCODONE-ACETAMINOPHEN 5-325 MG PO TABS
1.0000 | ORAL_TABLET | Freq: Four times a day (QID) | ORAL | Status: DC | PRN
  Administered 2017-11-23 – 2017-11-24 (×2): 1 via ORAL
  Filled 2017-11-20 (×2): qty 1

## 2017-11-20 MED ORDER — LATANOPROST 0.005 % OP SOLN
1.0000 [drp] | Freq: Every day | OPHTHALMIC | Status: DC
  Administered 2017-11-21 – 2017-11-23 (×4): 1 [drp] via OPHTHALMIC
  Filled 2017-11-20: qty 2.5

## 2017-11-20 MED ORDER — SODIUM CHLORIDE 0.9 % IV BOLUS (SEPSIS)
1000.0000 mL | Freq: Once | INTRAVENOUS | Status: AC
  Administered 2017-11-20: 1000 mL via INTRAVENOUS

## 2017-11-20 MED ORDER — ONDANSETRON HCL 4 MG PO TABS: 4.0000 mg | ORAL_TABLET | Freq: Four times a day (QID) | ORAL | Status: DC | PRN

## 2017-11-20 MED ORDER — ACETAMINOPHEN 325 MG PO TABS: 650.0000 mg | ORAL_TABLET | Freq: Four times a day (QID) | ORAL | Status: DC | PRN

## 2017-11-20 MED ORDER — BISOPROLOL FUMARATE 5 MG PO TABS
2.5000 mg | ORAL_TABLET | Freq: Every day | ORAL | Status: DC
  Administered 2017-11-21 – 2017-11-24 (×4): 2.5 mg via ORAL
  Filled 2017-11-20 (×2): qty 1
  Filled 2017-11-20: qty 0.5
  Filled 2017-11-20 (×2): qty 1

## 2017-11-20 MED ORDER — MEROPENEM 1 G IV SOLR
1.0000 g | Freq: Once | INTRAVENOUS | Status: AC
  Administered 2017-11-20: 1 g via INTRAVENOUS
  Filled 2017-11-20: qty 1

## 2017-11-20 MED ORDER — VANCOMYCIN HCL IN DEXTROSE 1-5 GM/200ML-% IV SOLN
1000.0000 mg | Freq: Once | INTRAVENOUS | Status: AC
  Administered 2017-11-20: 1000 mg via INTRAVENOUS
  Filled 2017-11-20: qty 200

## 2017-11-20 MED ORDER — POLYETHYLENE GLYCOL 3350 17 G PO PACK
17.0000 g | PACK | Freq: Every day | ORAL | Status: DC
  Administered 2017-11-21 – 2017-11-24 (×2): 17 g via ORAL
  Filled 2017-11-20 (×3): qty 1

## 2017-11-20 MED ORDER — NORTRIPTYLINE HCL 25 MG PO CAPS
75.0000 mg | ORAL_CAPSULE | Freq: Every day | ORAL | Status: DC
  Administered 2017-11-21: 75 mg via ORAL
  Filled 2017-11-20 (×2): qty 3

## 2017-11-20 MED ORDER — BUPROPION HCL 100 MG PO TABS
100.0000 mg | ORAL_TABLET | Freq: Every morning | ORAL | Status: DC
  Administered 2017-11-21 – 2017-11-24 (×4): 100 mg via ORAL
  Filled 2017-11-20 (×4): qty 1

## 2017-11-20 MED ORDER — LEVOFLOXACIN IN D5W 750 MG/150ML IV SOLN
750.0000 mg | Freq: Once | INTRAVENOUS | Status: AC
  Administered 2017-11-20: 750 mg via INTRAVENOUS
  Filled 2017-11-20: qty 150

## 2017-11-20 MED ORDER — INSULIN ASPART 100 UNIT/ML ~~LOC~~ SOLN
0.0000 [IU] | Freq: Three times a day (TID) | SUBCUTANEOUS | Status: DC
  Administered 2017-11-21: 2 [IU] via SUBCUTANEOUS
  Administered 2017-11-22 (×2): 1 [IU] via SUBCUTANEOUS
  Administered 2017-11-23: 2 [IU] via SUBCUTANEOUS
  Administered 2017-11-23 – 2017-11-24 (×2): 1 [IU] via SUBCUTANEOUS
  Filled 2017-11-20: qty 1

## 2017-11-20 MED ORDER — VANCOMYCIN HCL IN DEXTROSE 750-5 MG/150ML-% IV SOLN: 750.0000 mg | INTRAVENOUS | Status: DC

## 2017-11-20 MED ORDER — MIRTAZAPINE 15 MG PO TABS
30.0000 mg | ORAL_TABLET | Freq: Every day | ORAL | Status: DC
Start: 2017-11-20 — End: 2017-11-24
  Administered 2017-11-21 – 2017-11-23 (×4): 30 mg via ORAL
  Filled 2017-11-20 (×3): qty 2
  Filled 2017-11-20: qty 1

## 2017-11-20 MED ORDER — AZTREONAM 2 G IJ SOLR
2.0000 g | Freq: Once | INTRAMUSCULAR | Status: DC
  Filled 2017-11-20: qty 2

## 2017-11-20 MED ORDER — HYDROCORTISONE NA SUCCINATE PF 100 MG IJ SOLR
50.0000 mg | Freq: Three times a day (TID) | INTRAMUSCULAR | Status: DC
  Administered 2017-11-21 – 2017-11-22 (×6): 50 mg via INTRAVENOUS
  Filled 2017-11-20 (×6): qty 2

## 2017-11-20 MED ORDER — ONDANSETRON HCL 4 MG/2ML IJ SOLN: 4.0000 mg | Freq: Four times a day (QID) | INTRAMUSCULAR | Status: DC | PRN

## 2017-11-20 NOTE — H&P (Signed)
History and Physical    [name] [name]:[id number] DOB: 1935-08-01 DOA: 11/20/2017  PCP: Jarome Matin, MD  Patient coming from: Skilled nursing facility.  Chief Complaint: Weakness.  HPI: [name] is a 83 y.o. female with history of rheumatoid arthritis on immunosuppressant, adrenal insufficiency, hypertension, aortic stenosis status post TAVR was referred to the ER from skilled nursing facility after patient was found to be weak fatigue is ongoing for last few days with patient being orthostatic.  Patient states he has lost her voice last few days and states he has been feeling weak.  Has been coughing denies any chest pain.  Denies any nausea vomiting or diarrhea.  Patient was recently admitted for ESBL UTI following which patient again was admitted for pneumonia last month.  ED Course: In the ER patient had a lactate elevated around 3 with leukocytosis chest x-ray unremarkable UA still pending.  Given the immunosuppressive state she was empirically started on antibiotics after blood cultures and urine cultures sent.  Patient was given fluid bolus and admitted for hydration and possible developing sepsis.  Review of Systems: As per HPI, rest all negative.   Past Medical History:  Diagnosis Date  . Adrenal insufficiency, primary, iatrogenic   . Anxiety   . Aortic stenosis, severe 02/26/2013  . Chest pain    myoview 04/13/04-inferoapical thinning with a suggestion of mild apical ischemia, EF 66%  . Depression 03/25/2013  . Gait instability   . Hearing loss 03/25/2013  . Memory deficit 03/25/2013  . Murmur, cardiac   . Osteoporosis   . Palpitations   . Pancreatic insufficiency 11/09/2012  . Physical deconditioning   . Pneumonia   . Pulmonary fibrosis (HCC) 03/25/2013  . Rheumatoid arthritis(714.0)   . SOBOE (shortness of breath on exertion)    echo 02/23/13-EF 55-60%, possible bicuspid aortic valve with mod to severed stenosis   . Syncope   . UTI (lower urinary tract  infection) 10/01/2011    Past Surgical History:  Procedure Laterality Date  . ABDOMINAL HYSTERECTOMY    . CARDIAC CATHETERIZATION  11/08/96   no evidence of CAD, nl LV function  . CHOLECYSTECTOMY    . CORONARY ANGIOGRAM  03/22/2013   Procedure: CORONARY ANGIOGRAM;  Surgeon: [name], MD;  Location: [facility] CATH LAB;  Service: Cardiovascular;;  . KNEE ARTHROSCOPY     2 on left  . RIGHT HEART CATHETERIZATION  03/22/2013   Procedure: RIGHT HEART CATH;  Surgeon: [name], MD;  Location: [facility] CATH LAB;  Service: Cardiovascular;;  . SHOULDER SURGERY Left   . THYROIDECTOMY       reports that  has never smoked. she has never used smokeless tobacco. She reports that she does not drink alcohol or use drugs.  Allergies  Allergen Reactions  . Adhesive [Tape] Other (See Comments)    Skin peels up.   Marland Kitchen Penicillins Hives    Has patient had a PCN reaction causing immediate rash, facial/tongue/throat swelling, SOB or lightheadedness with hypotension: No Has patient had a PCN reaction causing severe rash involving mucus membranes or skin necrosis: No Has patient had a PCN reaction that required hospitalization No Has patient had a PCN reaction occurring within the last 10 years: No If all of the above answers are "NO", then may proceed with Cephalosporin use.     Family History  Problem Relation Age of Onset  . Cancer - Colon Father   . Stroke Brother   . Cancer Sister  spine?    Prior to Admission medications   Medication Sig Start Date End Date Taking? Authorizing Provider  ALPRAZolam Prudy Feeler) 1 MG tablet Take 0.5 tablets (0.5 mg total) by mouth at bedtime. Anxiety 11/02/17  Yes Tyrone Nine, MD  bisoprolol (ZEBETA) 5 MG tablet Take 0.5 tablets (2.5 mg total) by mouth daily. 07/24/16  Yes Hilty, Lisette Abu, MD  buPROPion (WELLBUTRIN) 100 MG tablet Take 100 mg by mouth every morning. 10/06/15  Yes [provider]  doxycycline (VIBRA-TABS) 100 MG tablet Take 100 mg by  mouth 2 (two) times daily.   Yes [provider]  furosemide (LASIX) 40 MG tablet Take 40 mg by mouth.   Yes [provider]  leflunomide (ARAVA) 20 MG tablet Take 1 tablet (20 mg total) by mouth daily. 11/10/17  Yes Tyrone Nine, MD  mirtazapine (REMERON) 30 MG tablet Take 30 mg by mouth at bedtime.    Yes [provider]  nortriptyline (PAMELOR) 75 MG capsule Take 75 mg by mouth at bedtime.   Yes [provider]  polyethylene glycol (MIRALAX / GLYCOLAX) packet Take 17 g by mouth daily.   Yes [provider]  predniSONE (DELTASONE) 20 MG tablet Take 1 tablet (20 mg total) by mouth daily with breakfast. Resume in 4days 11/02/17  Yes Tyrone Nine, MD  senna-docusate (SENOKOT-S) 8.6-50 MG tablet Take 1 tablet by mouth at bedtime as needed for mild constipation.   Yes [provider]  TRAVATAN Z 0.004 % SOLN ophthalmic solution Place 1 drop into both eyes at bedtime. 09/16/15  Yes [provider]  vitamin B-12 (CYANOCOBALAMIN) 1000 MCG tablet Take 1 tablet (1,000 mcg total) by mouth daily. 03/22/15  Yes Osvaldo Shipper, MD  HYDROcodone-acetaminophen (NORCO/VICODIN) 5-325 MG tablet Take 1 tablet by mouth every 6 (six) hours as needed for moderate pain. 11/02/17   Tyrone Nine, MD    Physical Exam: Vitals:   11/20/17 1841 11/20/17 1846 11/20/17 1947 11/20/17 2000  BP: 110/72  115/62 103/83  Pulse: (!) 107  (!) 103 (!) 102  Resp: 17  (!) 27 (!) 21  Temp:      TempSrc:      SpO2: 100%  100% 100%  Weight:  45.4 kg (100 lb)        Constitutional: Moderately built and nourished. Vitals:   11/20/17 1841 11/20/17 1846 11/20/17 1947 11/20/17 2000  BP: 110/72  115/62 103/83  Pulse: (!) 107  (!) 103 (!) 102  Resp: 17  (!) 27 (!) 21  Temp:      TempSrc:      SpO2: 100%  100% 100%  Weight:  45.4 kg (100 lb)     Eyes: Anicteric no pallor. ENMT: No discharge from the ears eyes nose or mouth. Neck: No mass felt.  No neck rigidity.  No  JVD appreciated.  No stridor. Respiratory: No rhonchi or crepitations. Cardiovascular: S1-S2 heard tachycardic. Abdomen: Soft nontender bowel sounds present. Musculoskeletal: No edema.  No joint effusion. Skin: No rash.  Skin appears warm. Neurologic: Alert awake oriented to time place and person.  Has mild dementia.  Moves all extremities. Psychiatric: Has mild  memory memory issues.   Labs on Admission: I have personally reviewed following labs and imaging studies  CBC: Recent Labs  Lab 11/20/17 1734  WBC 16.5*  NEUTROABS 15.2*  HGB 12.9  HCT 38.9  MCV 92.4  PLT 127*   Basic Metabolic Panel: Recent Labs  Lab 11/20/17 1734  NA  135  K 3.7  CL 94*  CO2 27  GLUCOSE 191*  BUN 51*  CREATININE 1.10*  CALCIUM 8.1*   GFR: Estimated Creatinine Clearance: 29.2 mL/min (A) (by C-G formula based on SCr of 1.1 mg/dL (H)). Liver Function Tests: Recent Labs  Lab 11/20/17 1734  AST 25  ALT 16  ALKPHOS 89  BILITOT 1.0  PROT 5.6*  ALBUMIN 3.1*   No results for input(s): LIPASE, AMYLASE in the last 168 hours. No results for input(s): AMMONIA in the last 168 hours. Coagulation Profile: No results for input(s): INR, PROTIME in the last 168 hours. Cardiac Enzymes: No results for input(s): CKTOTAL, CKMB, CKMBINDEX, TROPONINI in the last 168 hours. BNP (last 3 results) No results for input(s): PROBNP in the last 8760 hours. HbA1C: No results for input(s): HGBA1C in the last 72 hours. CBG: No results for input(s): GLUCAP in the last 168 hours. Lipid Profile: No results for input(s): CHOL, HDL, LDLCALC, TRIG, CHOLHDL, LDLDIRECT in the last 72 hours. Thyroid Function Tests: No results for input(s): TSH, T4TOTAL, FREET4, T3FREE, THYROIDAB in the last 72 hours. Anemia Panel: No results for input(s): VITAMINB12, FOLATE, FERRITIN, TIBC, IRON, RETICCTPCT in the last 72 hours. Urine analysis:    Component Value Date/Time   COLORURINE YELLOW 10/30/2017 2116   APPEARANCEUR CLEAR  10/30/2017 2116   LABSPEC 1.016 10/30/2017 2116   PHURINE 6.0 10/30/2017 2116   GLUCOSEU NEGATIVE 10/30/2017 2116   HGBUR MODERATE (A) 10/30/2017 2116   BILIRUBINUR NEGATIVE 10/30/2017 2116   KETONESUR 80 (A) 10/30/2017 2116   PROTEINUR 100 (A) 10/30/2017 2116   UROBILINOGEN 0.2 03/21/2015 0236   NITRITE NEGATIVE 10/30/2017 2116   LEUKOCYTESUR SMALL (A) 10/30/2017 2116   Sepsis Labs: @LABRCNTIP (procalcitonin:4,lacticidven:4) )No results found for this or any previous visit (from the past 240 hour(s)).   Radiological Exams on Admission: Dg Chest Port 1 View  Result Date: 11/20/2017 CLINICAL DATA:  Increased weakness over the last few days. EXAM: PORTABLE CHEST 1 VIEW COMPARISON:  Chest radiograph 10/30/2017. FINDINGS: Monitoring leads overlie the patient. Stable cardiac and mediastinal contours. Stent graft Re demonstrated. No consolidative pulmonary opacities. No pleural effusion or pneumothorax. Healing bilateral rib fractures. IMPRESSION: No acute cardiopulmonary process. Electronically Signed   By: 12/28/2017 M.D.   On: 11/20/2017 18:30    EKG: Independently reviewed.  Sinus tachycardia with ST-T changes comparable to the old EKG.  Assessment/Plan Principal Problem:   SIRS (systemic inflammatory response syndrome) (HCC) Active Problems:   Rheumatoid arthritis involving multiple joints (HCC)   DM2 (diabetes mellitus, type 2) (HCC)   Chronic adrenal insufficiency (HCC)   Pancreatic insufficiency (HCC)   Memory deficit   Pulmonary fibrosis (HCC)   COPD (chronic obstructive pulmonary disease) (HCC)   S/P TAVR (transcatheter aortic valve replacement)    1. SIRS -UA and urine culture still pending.  Chest x-ray was unremarkable.  Given the immunosuppressive state and patient being fatigued with elevated lactate levels and leukocytosis patient was started on empiric antibiotics.  Meropenem was chosen because patient had ESBL.  Follow cultures continue hydration.  Check  troponin. 2. History of hypertension on beta-blockers. 3. History of iron insufficiency presently on stress dose steroids. 4. Diabetes mellitus type 2 per the chart presently not on medication we will keep patient on sliding scale coverage. 5. History of COPD not wheezing at this time. 6. Rheumatoid arthritis -we will hold immunosuppressants for now since patient may have infection.  Patient also on stress dose steroids. 7. Acute renal failure likely  from dehydration  -hydrate and recheck metabolic panel. 8. History of TAVR for aortic stenosis.   DVT prophylaxis: Lovenox. Code Status: Full code. Family Communication: Discussed with patient. Disposition Plan: Skilled nursing facility. Consults called: None. Admission status: Observation.   Eduard Clos MD [facility] Pager [phone].  If 7PM-7AM, please contact night-coverage [email or web address] Password [email or web address]  11/20/2017, 8:59 PM

## 2017-11-20 NOTE — ED Triage Notes (Signed)
Pt comes from facility via EMS with complaints of increased weakness over the past few days.  States she becomes near syncopal upon standing.  She was placed in facility due to orthostatic hypotension. A&O x4.  Was admitted for pneumonia earlier this month. CBG 252. Vitals stable in route with the exception of tachypnea.

## 2017-11-20 NOTE — ED Notes (Signed)
Delay in recollect istat lactic until fluid bolus/abx have been administered

## 2017-11-20 NOTE — ED Provider Notes (Signed)
[facility]-EMERGENCY DEPT Provider Note   CSN: [id number] Arrival date & time: 11/20/17  1706     History   Chief Complaint No chief complaint on file.   HPI [name] is a 83 y.o. female.  83 year old female presents with increased weakness times several days.  Had a recent admission for pneumonia and sepsis.  Denies any new cough or congestion.  States she has not been short of breath.  Denies any abdominal discomfort.  No vomiting or diarrhea.  Patient gets dizzy when she stands up and she is currently in a facility due to persistent orthostatic hypotension.  EMS called and CBG was 252.  Patient transported here without further intervention.      Past Medical History:  Diagnosis Date  . Adrenal insufficiency, primary, iatrogenic   . Anxiety   . Aortic stenosis, severe 02/26/2013  . Chest pain    myoview 04/13/04-inferoapical thinning with a suggestion of mild apical ischemia, EF 66%  . Depression 03/25/2013  . Gait instability   . Hearing loss 03/25/2013  . Memory deficit 03/25/2013  . Murmur, cardiac   . Osteoporosis   . Palpitations   . Pancreatic insufficiency 11/09/2012  . Physical deconditioning   . Pneumonia   . Pulmonary fibrosis (HCC) 03/25/2013  . Rheumatoid arthritis(714.0)   . SOBOE (shortness of breath on exertion)    echo 02/23/13-EF 55-60%, possible bicuspid aortic valve with mod to severed stenosis   . Syncope   . UTI (lower urinary tract infection) 10/01/2011    Patient Active Problem List   Diagnosis Date Noted  . HCAP (healthcare-associated pneumonia) 10/30/2017  . Fracture of three ribs, left, closed, initial encounter 10/30/2017  . Sepsis associated hypotension (HCC) 10/19/2017  . Elevated troponin 10/19/2017  . Dizziness 07/24/2016  . Dyspnea 12/06/2015  . Hypoxia 12/06/2015  . Hypokalemia 10/21/2015  . Hypophosphatemia 10/21/2015  . Hypomagnesemia 10/21/2015  . CAP (community acquired pneumonia) 10/20/2015  . B12  deficiency   . Malnutrition of moderate degree (HCC) 03/18/2015  . Altered mental state 03/17/2015  . Metabolic encephalopathy 09/12/2014    Class: Acute  . Sepsis secondary to UTI (HCC) 09/09/2014  . S/P TAVR (transcatheter aortic valve replacement) 03/16/2014  . Left rib fracture 11/17/2013  . Closed pelvic fracture (HCC) 11/17/2013  . Sepsis (HCC) 11/10/2013  . UTI (lower urinary tract infection) 11/09/2013  . Sepsis syndrome 09/17/2013  . COPD (chronic obstructive pulmonary disease) (HCC) 07/20/2013  . Chest pain 06/29/2013  . Hearing loss 03/25/2013  . Memory deficit 03/25/2013  . Depression 03/25/2013  . Syncope 03/25/2013  . Pulmonary fibrosis (HCC)   . Aortic stenosis, severe 02/26/2013  . Pancreatic insufficiency (HCC) 11/09/2012    Class: Chronic  . Low back pain 01/16/2012    Class: Acute  . Gait instability 01/16/2012    Class: Acute  . Sinusitis, acute, sphenoidal 11/17/2011    Class: Acute  . Pneumonia 11/12/2011    Class: Acute  . Chronic adrenal insufficiency (HCC) 11/12/2011    Class: Chronic  . E. coli UTI (urinary tract infection) 10/01/2011  . Fever 10/01/2011  . Cough 10/01/2011  . Failure to thrive in adult 10/01/2011  . Weakness generalized 10/01/2011  . Rheumatoid arthritis involving multiple joints (HCC) 10/01/2011  . DM2 (diabetes mellitus, type 2) (HCC) 10/01/2011    Past Surgical History:  Procedure Laterality Date  . ABDOMINAL HYSTERECTOMY    . CARDIAC CATHETERIZATION  11/08/96   no evidence of CAD, nl LV function  .  CHOLECYSTECTOMY    . CORONARY ANGIOGRAM  03/22/2013   Procedure: CORONARY ANGIOGRAM;  Surgeon: [name], MD;  Location: [facility] CATH LAB;  Service: Cardiovascular;;  . KNEE ARTHROSCOPY     2 on left  . RIGHT HEART CATHETERIZATION  03/22/2013   Procedure: RIGHT HEART CATH;  Surgeon: [name], MD;  Location: [facility] CATH LAB;  Service: Cardiovascular;;  . SHOULDER SURGERY Left   . THYROIDECTOMY      OB History    No  data available       Home Medications    Prior to Admission medications   Medication Sig Start Date End Date Taking? Authorizing Provider  ALPRAZolam Prudy Feeler) 1 MG tablet Take 0.5 tablets (0.5 mg total) by mouth at bedtime. Anxiety 11/02/17   Tyrone Nine, MD  bisoprolol (ZEBETA) 5 MG tablet Take 0.5 tablets (2.5 mg total) by mouth daily. 07/24/16   Hilty, Lisette Abu, MD  buPROPion (WELLBUTRIN) 100 MG tablet Take 100 mg by mouth every morning. 10/06/15   [provider]  HYDROcodone-acetaminophen (NORCO/VICODIN) 5-325 MG tablet Take 1 tablet by mouth every 6 (six) hours as needed for moderate pain. 11/02/17   Tyrone Nine, MD  leflunomide (ARAVA) 20 MG tablet Take 1 tablet (20 mg total) by mouth daily. 11/10/17   Tyrone Nine, MD  mirtazapine (REMERON) 30 MG tablet Take 30 mg by mouth at bedtime.     [provider]  nortriptyline (PAMELOR) 75 MG capsule Take 75 mg by mouth at bedtime.    [provider]  predniSONE (DELTASONE) 20 MG tablet Take 1 tablet (20 mg total) by mouth daily with breakfast. Resume in 4days 11/02/17   Tyrone Nine, MD  TRAVATAN Z 0.004 % SOLN ophthalmic solution Place 1 drop into both eyes at bedtime. 09/16/15   [provider]  vitamin B-12 (CYANOCOBALAMIN) 1000 MCG tablet Take 1 tablet (1,000 mcg total) by mouth daily. 03/22/15   Osvaldo Shipper, MD    Family History Family History  Problem Relation Age of Onset  . Cancer - Colon Father   . Stroke Brother   . Cancer Sister        spine?    Social History Social History   Tobacco Use  . Smoking status: Never Smoker  . Smokeless tobacco: Never Used  Substance Use Topics  . Alcohol use: No  . Drug use: No     Allergies   Adhesive [tape] and Penicillins   Review of Systems Review of Systems  All other systems reviewed and are negative.    Physical Exam Updated Vital Signs BP 117/71   Pulse 100   Temp (!) 96.8 F (36 C) (Axillary)   Resp (!) 22   SpO2 97%     Physical Exam  Constitutional: She is oriented to person, place, and time. She appears well-developed and well-nourished. She is cooperative.  Non-toxic appearance. She has a sickly appearance. She appears ill. No distress.  HENT:  Head: Normocephalic and atraumatic.  Eyes: Conjunctivae, EOM and lids are normal. Pupils are equal, round, and reactive to light.  Neck: Normal range of motion. Neck supple. No tracheal deviation present. No thyroid mass present.  Cardiovascular: Normal rate, regular rhythm and normal heart sounds. Exam reveals no gallop.  No murmur heard. Pulmonary/Chest: Effort normal and breath sounds normal. No stridor. No respiratory distress. She has no decreased breath sounds. She has no wheezes. She has no rhonchi. She has no rales.  Abdominal: Soft. Normal  appearance and bowel sounds are normal. She exhibits no distension. There is no tenderness. There is no rebound and no CVA tenderness.  Musculoskeletal: Normal range of motion. She exhibits no edema or tenderness.  Neurological: She is alert and oriented to person, place, and time. She displays atrophy. No cranial nerve deficit or sensory deficit. GCS eye subscore is 4. GCS verbal subscore is 5. GCS motor subscore is 6.  Skin: Skin is warm and dry. No abrasion and no rash noted.  Psychiatric: Her affect is blunt.  Nursing note and vitals reviewed.    ED Treatments / Results  Labs (all labs ordered are listed, but only abnormal results are displayed) Labs Reviewed  I-STAT CG4 LACTIC ACID, ED - Abnormal; Notable for the following components:      Result Value   Lactic Acid, Venous 3.24 (*)    All other components within normal limits  CULTURE, BLOOD (ROUTINE X 2)  CULTURE, BLOOD (ROUTINE X 2)  COMPREHENSIVE METABOLIC PANEL  CBC WITH DIFFERENTIAL/PLATELET  URINALYSIS, ROUTINE W REFLEX MICROSCOPIC  I-STAT TROPONIN, ED  I-STAT CG4 LACTIC ACID, ED    EKG  EKG Interpretation  Date/Time:  Thursday November 20 2017 17:53:03 EST Ventricular Rate:  103 PR Interval:    QRS Duration: 137 QT Interval:  360 QTC Calculation: 472 R Axis:   -16 Text Interpretation:  Sinus tachycardia Ventricular preexcitation(WPW) No significant change since last tracing Confirmed by Lorre Nick ([id number]) on 11/20/2017 6:04:18 PM       Radiology No results found.  Procedures Procedures (including critical care time)  Medications Ordered in ED Medications - No data to display   Initial Impression / Assessment and Plan / ED Course  I have reviewed the triage vital signs and the nursing notes.  Pertinent labs & imaging results that were available during my care of the patient were reviewed by me and considered in my medical decision making (see chart for details).     Patient given IV fluids here for signs of dehydration.  Start on empiric IV antibiotics as lactate was elevated at 3.24.  Chest x-ray without evidence of infiltrate.  Urinalysis is pending.  Patient to be admitted to the hospital service  Final Clinical Impressions(s) / ED Diagnoses   Final diagnoses:  None    ED Discharge Orders    None       Lorre Nick, MD 11/20/17 2024

## 2017-11-20 NOTE — ED Notes (Signed)
Unable to obtain 2nd set of cultures, this RN was unsuccessful on 2nd set, Freight forwarder unsuccessful x2.

## 2017-11-20 NOTE — Progress Notes (Signed)
A consult was received from an ED physician for vancomycin/aztreonam/levaquin per pharmacy dosing.  The patient's profile has been reviewed for ht/wt/allergies/indication/available labs.   A one time order has been placed for vancomycin 1g, aztreonam 2g, levaquin 750mg  x 1.    Further antibiotics/pharmacy consults should be ordered by admitting physician if indicated.                       Thank you, , PharmD, BCPS 11/20/2017  7:30 PM

## 2017-11-20 NOTE — ED Notes (Signed)
I Stat Lactic: 3.24 RN and Provider Notified

## 2017-11-20 NOTE — Progress Notes (Signed)
Pharmacy Antibiotic Note  [name] is a 83 y.o. female admitted on 11/20/2017 with possible sepsis.  Pharmacy has been consulted for vancomycin and meropenem dosing.  Plan: Meropenem 1g IV q12h Vancomycin 1g IV x 1, then 750mg  IV q48h (estimated AUC 443) Check vancomycin peak and trough at steady state to calculate AUC (goal 400-500) Follow up renal function & cultures  Weight: 100 lb (45.4 kg)  Temp (24hrs), Avg:97.4 F (36.3 C), Min:96.8 F (36 C), Max:97.9 F (36.6 C)  Recent Labs  Lab 11/20/17 1734 11/20/17 1747  WBC 16.5*  --   CREATININE 1.10*  --   LATICACIDVEN  --  3.24*    Estimated Creatinine Clearance: 29.2 mL/min (A) (by C-G formula based on SCr of 1.1 mg/dL (H)).    Allergies  Allergen Reactions  . Adhesive [Tape] Other (See Comments)    Skin peels up.   11/22/17 Penicillins Hives    Has patient had a PCN reaction causing immediate rash, facial/tongue/throat swelling, SOB or lightheadedness with hypotension: No Has patient had a PCN reaction causing severe rash involving mucus membranes or skin necrosis: No Has patient had a PCN reaction that required hospitalization No Has patient had a PCN reaction occurring within the last 10 years: No If all of the above answers are "NO", then may proceed with Cephalosporin use.     Antimicrobials this admission:  1/31 Levaquin x 1 1/31 Vancomycin >> 1/31 Meropenem >>  Dose adjustments this admission:    Microbiology results:   12/30 Ucx: >100k E. Coli - ESBL (suc to amp/sulb, gent, imi, NTF, P/T, TMP/SMZ)  Thank you for allowing pharmacy to be a part of this patient's care.  1/31, PharmD, BCPS Pager: [phone] 11/20/2017 9:35 PM

## 2017-11-20 NOTE — ED Notes (Signed)
X-ray at bedside

## 2017-11-21 ENCOUNTER — Other Ambulatory Visit: Payer: Self-pay

## 2017-11-21 DIAGNOSIS — R6510 Systemic inflammatory response syndrome (SIRS) of non-infectious origin without acute organ dysfunction: Secondary | ICD-10-CM | POA: Diagnosis not present

## 2017-11-21 LAB — TROPONIN I
TROPONIN I: 0.07 ng/mL — AB (ref <0.03)
Troponin I: 0.06 ng/mL (ref <0.03)

## 2017-11-21 LAB — BASIC METABOLIC PANEL
Anion gap: 9 (ref 5–15)
BUN: 39 mg/dL — ABNORMAL HIGH (ref 6–20)
CALCIUM: 7.3 mg/dL — AB (ref 8.9–10.3)
CHLORIDE: 104 mmol/L (ref 101–111)
CO2: 26 mmol/L (ref 22–32)
Creatinine, Ser: 0.8 mg/dL (ref 0.44–1.00)
GFR calc non Af Amer: >60 mL/min (ref >60)
Glucose, Bld: 79 mg/dL (ref 65–99)
Potassium: 3.9 mmol/L (ref 3.5–5.1)
Sodium: 139 mmol/L (ref 135–145)

## 2017-11-21 LAB — URINALYSIS, ROUTINE W REFLEX MICROSCOPIC
BACTERIA UA: NONE SEEN
Bilirubin Urine: NEGATIVE
Glucose, UA: NEGATIVE mg/dL
Ketones, ur: NEGATIVE mg/dL
Nitrite: NEGATIVE
Protein, ur: NEGATIVE mg/dL
SPECIFIC GRAVITY, URINE: 1.014 (ref 1.005–1.030)
pH: 6 (ref 5.0–8.0)

## 2017-11-21 LAB — GLUCOSE, CAPILLARY
Glucose-Capillary: 132 mg/dL — ABNORMAL HIGH (ref 65–99)
Glucose-Capillary: 131 mg/dL — ABNORMAL HIGH (ref 65–99)

## 2017-11-21 LAB — CBC
HCT: 34.3 % — ABNORMAL LOW (ref 36.0–46.0)
Hemoglobin: 11.5 g/dL — ABNORMAL LOW (ref 12.0–15.0)
MCH: 30.8 pg (ref 26.0–34.0)
MCHC: 33.5 g/dL (ref 30.0–36.0)
MCV: 92 fL (ref 78.0–100.0)
Platelets: 110 10*3/uL — ABNORMAL LOW (ref 150–400)
RBC: 3.73 MIL/uL — ABNORMAL LOW (ref 3.87–5.11)
RDW: 14.9 % (ref 11.5–15.5)
WBC: 16.5 10*3/uL — ABNORMAL HIGH (ref 4.0–10.5)

## 2017-11-21 LAB — INFLUENZA PANEL BY PCR (TYPE A & B)
INFLAPCR: NEGATIVE
INFLBPCR: NEGATIVE

## 2017-11-21 LAB — CBG MONITORING, ED
GLUCOSE-CAPILLARY: 167 mg/dL — AB (ref 65–99)
Glucose-Capillary: 61 mg/dL — ABNORMAL LOW (ref 65–99)

## 2017-11-21 MED ORDER — DEXTROSE-NACL 5-0.45 % IV SOLN
INTRAVENOUS | Status: DC
  Administered 2017-11-21 – 2017-11-22 (×3): via INTRAVENOUS

## 2017-11-21 MED ORDER — HALOPERIDOL LACTATE 5 MG/ML IJ SOLN
2.5000 mg | Freq: Once | INTRAMUSCULAR | Status: AC
  Administered 2017-11-21: 2.5 mg via INTRAVENOUS
  Filled 2017-11-21: qty 1

## 2017-11-21 MED ORDER — NORTRIPTYLINE HCL 25 MG PO CAPS
25.0000 mg | ORAL_CAPSULE | Freq: Every day | ORAL | Status: DC
Start: 2017-11-21 — End: 2017-11-24
  Administered 2017-11-21 – 2017-11-23 (×3): 25 mg via ORAL
  Filled 2017-11-21 (×4): qty 1

## 2017-11-21 NOTE — ED Notes (Signed)
Report given to [place], [place] [phone].

## 2017-11-21 NOTE — Progress Notes (Signed)
Patient Demographics:    [name], is a 83 y.o. female, DOB - [birth year], XEN:[id number]  Admit date - 11/20/2017   Admitting Physician Eduard Clos, MD  Outpatient Primary MD for the patient is Jarome Matin, MD  LOS - 0   No chief complaint on file.       Subjective:    [name] today has no fevers, no emesis,  No chest pain, no diarrhea, appetite is poor  Assessment  & Plan :    Principal Problem:   SIRS (systemic inflammatory response syndrome) (HCC) Active Problems:   Rheumatoid arthritis involving multiple joints (HCC)   DM2 (diabetes mellitus, type 2) (HCC)   Chronic adrenal insufficiency (HCC)   Pancreatic insufficiency (HCC)   Memory deficit   Pulmonary fibrosis (HCC)   COPD (chronic obstructive pulmonary disease) (HCC)   S/P TAVR (transcatheter aortic valve replacement)  Brief summary:-  84 y.o. female with history of rheumatoid arthritis on immunosuppressant, adrenal insufficiency, hypertension, aortic stenosis status post TAVR admitted 11/20/17 from skilled nursing facility with Generalized Weakness/Fatigue and Orthostatic Hypotension.  Patient states she has lost her voice last few days.  Recently treated for ESBL E. coli   Plan:- 1)SIRS-doubt frank sepsis, lactic acid was elevated most likely due to dehydration, pro calcitonin was not elevated, patient has leukocytosis most likely due to steroids, cultures are pending, no further antibiotics for now (initially received vancomycin, Levaquin and meropenem)  2)H/o HTN-continue bisoprolol 2.5 mg daily, watch BP closely, decrease nortriptyline to 25 mg nightly from 75 mg nightly to reduce orthostatic concerns  3)DM-had hypoglycemia secondary to poor oral intake, anticipate hyeroglycemia and due to stress dose steroids  4)COPD-no acute exacerbation at this time, patient currently on stress dose steroids, continue  bronchodilators  5)RA-hold immunosuppressants, patient currently on stress dose steroids  6)History of adrenal insufficiency-continue Solu-Cortef 50 mg every 8 hours [stress dose steroids]  7)Dehydration-on admission patient's BUN was up to 51 with a creatinine of 1.0, please note that 2 weeks ago BUN and creatinine were 17 and 0.9, I suspect dehydration and prerenal azotemia explains elevated lactic acid noted #1 above  Code Status : Full code  Disposition Plan  : SNF  DVT Prophylaxis  :  Lovenox    Lab Results  Component Value Date   PLT 110 (L) 11/21/2017    Inpatient Medications  Scheduled Meds: . bisoprolol  2.5 mg Oral Daily  . buPROPion  100 mg Oral q morning - 10a  . enoxaparin (LOVENOX) injection  30 mg Subcutaneous QHS  . hydrocortisone sod succinate (SOLU-CORTEF) inj  50 mg Intravenous Q8H  . insulin aspart  0-9 Units Subcutaneous TID WC  . latanoprost  1 drop Both Eyes QHS  . mirtazapine  30 mg Oral QHS  . nortriptyline  75 mg Oral QHS  . polyethylene glycol  17 g Oral Daily   Continuous Infusions: . dextrose 5 % and 0.45% NaCl 125 mL/hr at 11/21/17 0856   PRN Meds:.acetaminophen **OR** acetaminophen, HYDROcodone-acetaminophen, ondansetron **OR** ondansetron (ZOFRAN) IV, senna-docusate    Anti-infectives (From admission, onward)   Start     Dose/Rate Route Frequency Ordered Stop   11/22/17 2200  vancomycin (VANCOCIN) IVPB 750 mg/150 ml premix  Status:  Discontinued  750 mg 150 mL/hr over 60 Minutes Intravenous Every 48 hours 11/20/17 2134 11/21/17 0840   11/21/17 1000  meropenem (MERREM) 1 g in sodium chloride 0.9 % 100 mL IVPB  Status:  Discontinued     1 g 200 mL/hr over 30 Minutes Intravenous Every 12 hours 11/20/17 2133 11/21/17 0840   11/20/17 2145  meropenem (MERREM) 1 g in sodium chloride 0.9 % 100 mL IVPB     1 g 200 mL/hr over 30 Minutes Intravenous  Once 11/20/17 2131 11/20/17 2331   11/20/17 1945  aztreonam (AZACTAM) 2 GM IVPB  Status:   Discontinued     2 g 100 mL/hr over 30 Minutes Intravenous  Once 11/20/17 1931 11/20/17 2131   11/20/17 1930  levofloxacin (LEVAQUIN) IVPB 750 mg     750 mg 100 mL/hr over 90 Minutes Intravenous  Once 11/20/17 1927 11/20/17 2149   11/20/17 1930  aztreonam (AZACTAM) 2 g in dextrose 5 % 50 mL IVPB  Status:  Discontinued     2 g 100 mL/hr over 30 Minutes Intravenous  Once 11/20/17 1927 11/20/17 1931   11/20/17 1930  vancomycin (VANCOCIN) IVPB 1000 mg/200 mL premix     1,000 mg 200 mL/hr over 60 Minutes Intravenous  Once 11/20/17 1927 11/21/17 0043        Objective:   Vitals:   11/21/17 1400 11/21/17 1430 11/21/17 1500 11/21/17 1527  BP: (!) 113/50 (!) 130/52 136/61   Pulse: 79  86   Resp: 15 15 18    Temp:   97.6 F (36.4 C)   TempSrc:   Oral   SpO2: 100% 100% 93%   Weight:    46 kg (101 lb 6.6 oz)  Height:    5\' 3"  (1.6 m)    Wt Readings from Last 3 Encounters:  11/21/17 46 kg (101 lb 6.6 oz)  10/31/17 49.3 kg (108 lb 11 oz)  10/19/17 54.6 kg (120 lb 5.9 oz)     Intake/Output Summary (Last 24 hours) at 11/21/2017 1634 Last data filed at 11/21/2017 1500 Gross per 24 hour  Intake 5515.83 ml  Output -  Net 5515.83 ml     Physical Exam  Gen:- Awake Alert,  In no apparent distress , low pitched voice HEENT:- [place].AT, No sclera icterus Neck-Supple Neck,No JVD,.  Lungs-diminished in bases without wheezing  CV- S1, S2 normal Abd-  +ve B.Sounds, Abd Soft, No tenderness,    Extremity/Skin:- No  edema,    Neuropsych-generalized weakness without new focal deficits, affect is appropriate   Data Review:   Micro Results Recent Results (from the past 240 hour(s))  Blood culture (routine x 2)     Status: None (Preliminary result)   Collection Time: 11/20/17  6:38 PM  Result Value Ref Range Status   Specimen Description   Final    BLOOD BLOOD LEFT FOREARM Performed at [facility], [address] [place], [place] [address]    Special Requests   Final     IN PEDIATRIC BOTTLE Blood Culture adequate volume Performed at [facility], [address], [place], [place] [address]    Culture   Final    NO GROWTH < 24 HOURS Performed at [facility], [address], [place], [place] [address]    Report Status PENDING  Incomplete    Radiology Reports Dg Chest 2 View  Result Date: 10/30/2017 CLINICAL DATA:  Status post possible fall with injury to the ribs. EXAM: CHEST  2 VIEW  COMPARISON:  10/19/2017 FINDINGS: Prior sending aortic stent graft repair. Cardiomediastinal silhouette is normal. Mediastinal contours appear intact. Calcific atherosclerotic disease of the aorta. Stable elevation of the left hemidiaphragm. There is no evidence of focal airspace consolidation, pleural effusion or pneumothorax. Osseous structures are without acute abnormality. Thoracic kyphosis with stable anterior compression deformity of T6 vertebral body. Minimally displaced left fourth posterior rib fracture. Soft tissues are grossly normal. IMPRESSION: Minimally displaced left 4th posterior rib fracture. No evidence of pneumothorax. Electronically Signed   By: Ted Mcalpine M.D.   On: 10/30/2017 20:01   Ct Head Wo Contrast  Result Date: 10/30/2017 CLINICAL DATA:  Status post fall. EXAM: CT HEAD WITHOUT CONTRAST TECHNIQUE: Contiguous axial images were obtained from the base of the skull through the vertex without intravenous contrast. COMPARISON:  10/14/2015 FINDINGS: Brain: No evidence of acute infarction, hemorrhage, hydrocephalus, extra-axial collection or mass lesion/mass effect. Moderate brain parenchymal volume loss and deep white matter microangiopathy. Chronic lacunar infarct of the right thalamus. Vascular: Atherosclerotic calcifications at the intra cavernous carotid arteries. Skull: Normal. Negative for fracture or focal lesion. Sinuses/Orbits: No acute finding. Other: None. IMPRESSION: No evidence of acute traumatic injury to head. Moderate  brain parenchymal atrophy and chronic microvascular disease. Chronic right thalamic lacunar infarct. Electronically Signed   By: Ted Mcalpine M.D.   On: 10/30/2017 20:41   Ct Chest Wo Contrast  Result Date: 10/30/2017 CLINICAL DATA:  Fall with rib pain. EXAM: CT CHEST WITHOUT CONTRAST TECHNIQUE: Multidetector CT imaging of the chest was performed following the standard protocol without IV contrast. COMPARISON:  05/27/2015 FINDINGS: Cardiovascular: Heart size upper normal to mildly enlarged. No pericardial effusion. Patient is status post aortic valve replacement. Coronary artery calcification is evident. Atherosclerotic calcification is noted in the wall of the thoracic aorta. Prominence of the main pulmonary artery raises the question of pulmonary arterial hypertension. Mediastinum/Nodes: No mediastinal lymphadenopathy. No evidence for gross hilar lymphadenopathy although assessment is limited by the lack of intravenous contrast on today's study. Lungs/Pleura: Evaluation of lung parenchyma is obscured scratchy at fine detail of lung parenchyma is obscured by patient breathing motion during image acquisition. Probable emphysema with some interlobular septal thickening in the lung apices. Patchy atelectasis or pneumonia noted posterior left lower lobe. No pneumothorax. No pleural effusion. Upper Abdomen: Unremarkable. Musculoskeletal: Multiple left-sided rib fractures of varying age are identified. Anterior old fractures of lower left ribs evident. Fractures of the posterior tenth, eleventh, and twelfth ribs on the left are nonacute as there is evidence of bony callus, but these are not yet healed. Old anterior right rib fractures identified in the lower ribs but incompletely visualized. IMPRESSION: 1. Posterior fractures of the left tenth, eleventh, and twelfth ribs are at least subacute as bony callus is evident but the fracture lines are still visible consistent with incomplete healing. 2. Old healed  fractures are identified in the anterior lower ribs bilaterally. 3. No pneumothorax or pleural effusion. 4. Mild patchy airspace opacity posterior left lower lobe compatible with atelectasis or pneumonia. 5.  Aortic Atherosclerois (ICD10-170.0) 6. Question pulmonary arterial hypertension. Electronically Signed   By: Kennith Center M.D.   On: 10/30/2017 20:44   Dg Chest Port 1 View  Result Date: 11/20/2017 CLINICAL DATA:  Increased weakness over the last few days. EXAM: PORTABLE CHEST 1 VIEW COMPARISON:  Chest radiograph 10/30/2017. FINDINGS: Monitoring leads overlie the patient. Stable cardiac and mediastinal contours. Stent graft Re demonstrated. No consolidative pulmonary opacities. No pleural effusion or pneumothorax. Healing bilateral rib fractures.  IMPRESSION: No acute cardiopulmonary process. Electronically Signed   By: Annia Belt M.D.   On: 11/20/2017 18:30     CBC Recent Labs  Lab 11/20/17 1734 11/21/17 0410  WBC 16.5* 16.5*  HGB 12.9 11.5*  HCT 38.9 34.3*  PLT 127* 110*  MCV 92.4 92.0  MCH 30.6 30.8  MCHC 33.2 33.5  RDW 14.7 14.9  LYMPHSABS 0.8  --   MONOABS 0.4  --   EOSABS 0.0  --   BASOSABS 0.0  --     Chemistries  Recent Labs  Lab 11/20/17 1734 11/20/17 2119 11/21/17 0410  NA 135  --  139  K 3.7  --  3.9  CL 94*  --  104  CO2 27  --  26  GLUCOSE 191*  --  79  BUN 51*  --  39*  CREATININE 1.10* <0.30* 0.80  CALCIUM 8.1*  --  7.3*  AST 25  --   --   ALT 16  --   --   ALKPHOS 89  --   --   BILITOT 1.0  --   --    ------------------------------------------------------------------------------------------------------------------ No results for input(s): CHOL, HDL, LDLCALC, TRIG, CHOLHDL, LDLDIRECT in the last 72 hours.  Lab Results  Component Value Date   HGBA1C 5.0 11/01/2017   ------------------------------------------------------------------------------------------------------------------ No results for input(s): TSH, T4TOTAL, T3FREE, THYROIDAB in the  last 72 hours.  Invalid input(s): FREET3 ------------------------------------------------------------------------------------------------------------------ No results for input(s): VITAMINB12, FOLATE, FERRITIN, TIBC, IRON, RETICCTPCT in the last 72 hours.  Coagulation profile No results for input(s): INR, PROTIME in the last 168 hours.  No results for input(s): DDIMER in the last 72 hours.  Cardiac Enzymes Recent Labs  Lab 11/20/17 2119 11/21/17 0249 11/21/17 1048  TROPONINI <0.03 0.07* 0.06*   ------------------------------------------------------------------------------------------------------------------    Component Value Date/Time   BNP 65.2 03/23/2014 1429     Mykeria Garman M.D on 11/21/2017 at 4:34 PM  Between 7am to 7pm - Pager - [phone]  After 7pm go to [email or web address] - password [email or web address]  [facility] -  Office  [phone]   Voice Recognition [name] dictation system was used to create this note, attempts have been made to correct errors. Please contact the author with questions and/or clarifications.

## 2017-11-21 NOTE — ED Notes (Signed)
Paged [name] about which fluid infusion he would like running for patient. Patient initially was receiving 0.9% sodium chloride. Patients sugar was 61 a 0800. New order for D-5%-0.45% at 125 ml/hr and 400 mL of 0.9% sodium chloride bolus. Patients CBG was 167 at 1200.

## 2017-11-21 NOTE — ED Notes (Signed)
Blood draw unsuccessful x2  

## 2017-11-21 NOTE — ED Notes (Signed)
ED TO INPATIENT HANDOFF REPORT  Name/Age/Gender [name] 83 y.o. female  Code Status    Code Status Orders  (From admission, onward)        Start     Ordered   11/20/17 2056  Full code  Continuous     11/20/17 2058    Code Status History    Date Active Date Inactive Code Status Order ID Comments User Context   10/30/2017 22:05 11/03/2017 22:43 Full Code [id number]  Leah Quill, DO ED   10/19/2017 03:36 10/22/2017 20:04 Full Code [id number]  Leah Quill, DO ED   10/20/2015 22:19 10/23/2015 17:13 Full Code [id number]  Leah Milan, MD Inpatient   03/17/2015 23:58 03/22/2015 21:25 Full Code [id number]  Leah Blaze, MD Inpatient   09/09/2014 21:44 09/16/2014 14:04 Full Code [id number]  Leah Reel, MD ED   11/09/2013 20:37 11/17/2013 19:40 Full Code [id number]  Leah Howells, MD ED   09/16/2013 01:37 09/17/2013 18:49 Full Code [id number]  Leah Ly, MD Inpatient   11/12/2011 08:39 11/18/2011 17:41 Full Code [id number]  Leah Battles, MD ED   10/01/2011 18:57 10/14/2011 13:15 Full Code [id number]  Leah Reel, MD ED      Home/SNF/Other Nursing Home  Chief Complaint Weakness  Level of Care/Admitting Diagnosis ED Disposition    ED Disposition Condition [place]: [facility] [048889]  Level of Care: Telemetry [5]  Admit to tele based on following criteria: Monitor for Ischemic changes  Diagnosis: SIRS (systemic inflammatory response syndrome) [facility]) [169450]  Admitting Physician: Rise Patience [id number]  Attending Physician: Rise Patience [email or web address]  PT Class (Do Not Modify): Observation [104]  PT Acc Code (Do Not Modify): Observation [10022]       Medical History Past Medical History:  Diagnosis Date  . Adrenal insufficiency, primary, iatrogenic   . Anxiety   . Aortic stenosis, severe 02/26/2013  . Chest pain    myoview 04/13/04-inferoapical thinning with a suggestion of mild apical ischemia, EF 66%  .  Depression 03/25/2013  . Gait instability   . Hearing loss 03/25/2013  . Memory deficit 03/25/2013  . Murmur, cardiac   . Osteoporosis   . Palpitations   . Pancreatic insufficiency 11/09/2012  . Physical deconditioning   . Pneumonia   . Pulmonary fibrosis ([place]) 03/25/2013  . Rheumatoid arthritis(714.0)   . SOBOE (shortness of breath on exertion)    echo 02/23/13-EF 55-60%, possible bicuspid aortic valve with mod to severed stenosis   . Syncope   . UTI (lower urinary tract infection) 10/01/2011    Allergies Allergies  Allergen Reactions  . Adhesive [Tape] Other (See Comments)    Skin peels up.   Marland Kitchen Penicillins Hives    Has patient had a PCN reaction causing immediate rash, facial/tongue/throat swelling, SOB or lightheadedness with hypotension: No Has patient had a PCN reaction causing severe rash involving mucus membranes or skin necrosis: No Has patient had a PCN reaction that required hospitalization No Has patient had a PCN reaction occurring within the last 10 years: No If all of the above answers are "NO", then may proceed with Cephalosporin use.     IV Location/Drains/Wounds Patient Lines/Drains/Airways Status   Active Line/Drains/Airways    Name:   Placement date:   Placement time:   Site:   Days:   Peripheral IV 11/20/17 Left Antecubital   11/20/17    1722    Antecubital   1   Peripheral  IV 11/20/17 Left;Distal Forearm   11/20/17    1842    Forearm   1   External Urinary Catheter   11/20/17    1827    -   1          Labs/Imaging Results for orders placed or performed during the hospital encounter of 11/20/17 (from the past 48 hour(s))  Comprehensive metabolic panel     Status: Abnormal   Collection Time: 11/20/17  5:34 PM  Result Value Ref Range   Sodium 135 135 - 145 mmol/L   Potassium 3.7 3.5 - 5.1 mmol/L   Chloride 94 (L) 101 - 111 mmol/L   CO2 27 22 - 32 mmol/L   Glucose, Bld 191 (H) 65 - 99 mg/dL   BUN 51 (H) 6 - 20 mg/dL   Creatinine, Ser 1.10 (H) 0.44 - 1.00  mg/dL   Calcium 8.1 (L) 8.9 - 10.3 mg/dL   Total Protein 5.6 (L) 6.5 - 8.1 g/dL   Albumin 3.1 (L) 3.5 - 5.0 g/dL   AST 25 15 - 41 U/L   ALT 16 14 - 54 U/L   Alkaline Phosphatase 89 38 - 126 U/L   Total Bilirubin 1.0 0.3 - 1.2 mg/dL   GFR calc non Af Amer 46 (L) >60 mL/min   GFR calc Af Amer 53 (L) >60 mL/min    Comment: (NOTE) The eGFR has been calculated using the CKD EPI equation. This calculation has not been validated in all clinical situations. eGFR's persistently <60 mL/min signify possible Chronic Kidney Disease.    Anion gap 14 5 - 15  CBC with Differential     Status: Abnormal   Collection Time: 11/20/17  5:34 PM  Result Value Ref Range   WBC 16.5 (H) 4.0 - 10.5 K/uL   RBC 4.21 3.87 - 5.11 MIL/uL   Hemoglobin 12.9 12.0 - 15.0 g/dL   HCT 38.9 36.0 - 46.0 %   MCV 92.4 78.0 - 100.0 fL   MCH 30.6 26.0 - 34.0 pg   MCHC 33.2 30.0 - 36.0 g/dL   RDW 14.7 11.5 - 15.5 %   Platelets 127 (L) 150 - 400 K/uL   Neutrophils Relative % 92 %   Neutro Abs 15.2 (H) 1.7 - 7.7 K/uL   Lymphocytes Relative 5 %   Lymphs Abs 0.8 0.7 - 4.0 K/uL   Monocytes Relative 3 %   Monocytes Absolute 0.4 0.1 - 1.0 K/uL   Eosinophils Relative 0 %   Eosinophils Absolute 0.0 0.0 - 0.7 K/uL   Basophils Relative 0 %   Basophils Absolute 0.0 0.0 - 0.1 K/uL  I-Stat CG4 Lactic Acid, ED     Status: Abnormal   Collection Time: 11/20/17  5:47 PM  Result Value Ref Range   Lactic Acid, Venous 3.24 (HH) 0.5 - 1.9 mmol/L   Comment NOTIFIED PHYSICIAN   I-Stat Troponin, ED (not at [facility])     Status: None   Collection Time: 11/20/17  6:01 PM  Result Value Ref Range   Troponin i, poc 0.03 0.00 - 0.08 ng/mL   Comment 3            Comment: Due to the release kinetics of cTnI, a negative result within the first hours of the onset of symptoms does not rule out myocardial infarction with certainty. If myocardial infarction is still suspected, repeat the test at appropriate intervals.   Creatinine, serum     Status:  Abnormal   Collection Time: 11/20/17  9:19 PM  Result Value Ref Range   Creatinine, Ser <0.30 (L) 0.44 - 1.00 mg/dL   GFR calc non Af Amer NOT CALCULATED >60 mL/min   GFR calc Af Amer NOT CALCULATED >60 mL/min    Comment: (NOTE) The eGFR has been calculated using the CKD EPI equation. This calculation has not been validated in all clinical situations. eGFR's persistently <60 mL/min signify possible Chronic Kidney Disease.   Troponin I     Status: None   Collection Time: 11/20/17  9:19 PM  Result Value Ref Range   Troponin I <0.03 <0.03 ng/mL  Lactic acid, plasma     Status: None   Collection Time: 11/20/17  9:19 PM  Result Value Ref Range   Lactic Acid, Venous 0.7 0.5 - 1.9 mmol/L  Procalcitonin     Status: None   Collection Time: 11/20/17  9:19 PM  Result Value Ref Range   Procalcitonin <0.10 ng/mL    Comment:        Interpretation: PCT (Procalcitonin) <= 0.5 ng/mL: Systemic infection (sepsis) is not likely. Local bacterial infection is possible. (NOTE)       Sepsis PCT Algorithm           Lower Respiratory Tract                                      Infection PCT Algorithm    ----------------------------     ----------------------------         PCT < 0.25 ng/mL                PCT < 0.10 ng/mL         Strongly encourage             Strongly discourage   discontinuation of antibiotics    initiation of antibiotics    ----------------------------     -----------------------------       PCT 0.25 - 0.50 ng/mL            PCT 0.10 - 0.25 ng/mL               OR       >80% decrease in PCT            Discourage initiation of                                            antibiotics      Encourage discontinuation           of antibiotics    ----------------------------     -----------------------------         PCT >= 0.50 ng/mL              PCT 0.26 - 0.50 ng/mL               AND        <80% decrease in PCT             Encourage initiation of                                              antibiotics       Encourage continuation  of antibiotics    ----------------------------     -----------------------------        PCT >= 0.50 ng/mL                  PCT > 0.50 ng/mL               AND         increase in PCT                  Strongly encourage                                      initiation of antibiotics    Strongly encourage escalation           of antibiotics                                     -----------------------------                                           PCT <= 0.25 ng/mL                                                 OR                                        > 80% decrease in PCT                                     Discontinue / Do not initiate                                             antibiotics   CBG monitoring, ED     Status: Abnormal   Collection Time: 11/20/17 10:35 PM  Result Value Ref Range   Glucose-Capillary 108 (H) 65 - 99 mg/dL  Troponin I     Status: Abnormal   Collection Time: 11/21/17  2:49 AM  Result Value Ref Range   Troponin I 0.07 (HH) <0.03 ng/mL    Comment: CRITICAL RESULT CALLED TO, READ BACK BY AND VERIFIED WITH: OXIDINE,J RN 2.1.19 '@0341'  ZANDO,C   Basic metabolic panel     Status: Abnormal   Collection Time: 11/21/17  4:10 AM  Result Value Ref Range   Sodium 139 135 - 145 mmol/L   Potassium 3.9 3.5 - 5.1 mmol/L   Chloride 104 101 - 111 mmol/L   CO2 26 22 - 32 mmol/L   Glucose, Bld 79 65 - 99 mg/dL   BUN 39 (H) 6 - 20 mg/dL   Creatinine, Ser 0.80 0.44 - 1.00 mg/dL   Calcium 7.3 (L) 8.9 - 10.3 mg/dL   GFR calc non Af Amer >60 >60 mL/min   GFR calc Af Amer >60 >60 mL/min  Comment: (NOTE) The eGFR has been calculated using the CKD EPI equation. This calculation has not been validated in all clinical situations. eGFR's persistently <60 mL/min signify possible Chronic Kidney Disease.    Anion gap 9 5 - 15  CBC     Status: Abnormal   Collection Time: 11/21/17  4:10 AM  Result Value Ref Range   WBC 16.5  (H) 4.0 - 10.5 K/uL   RBC 3.73 (L) 3.87 - 5.11 MIL/uL   Hemoglobin 11.5 (L) 12.0 - 15.0 g/dL   HCT 34.3 (L) 36.0 - 46.0 %   MCV 92.0 78.0 - 100.0 fL   MCH 30.8 26.0 - 34.0 pg   MCHC 33.5 30.0 - 36.0 g/dL   RDW 14.9 11.5 - 15.5 %   Platelets 110 (L) 150 - 400 K/uL    Comment: REPEATED TO VERIFY SPECIMEN CHECKED FOR CLOTS PLATELET COUNT CONFIRMED BY SMEAR   Urinalysis, Routine w reflex microscopic     Status: Abnormal   Collection Time: 11/21/17  7:40 AM  Result Value Ref Range   Color, Urine YELLOW YELLOW   APPearance CLEAR CLEAR   Specific Gravity, Urine 1.014 1.005 - 1.030   pH 6.0 5.0 - 8.0   Glucose, UA NEGATIVE NEGATIVE mg/dL   Hgb urine dipstick SMALL (A) NEGATIVE   Bilirubin Urine NEGATIVE NEGATIVE   Ketones, ur NEGATIVE NEGATIVE mg/dL   Protein, ur NEGATIVE NEGATIVE mg/dL   Nitrite NEGATIVE NEGATIVE   Leukocytes, UA MODERATE (A) NEGATIVE   RBC / HPF 0-5 0 - 5 RBC/hpf   WBC, UA 0-5 0 - 5 WBC/hpf   Bacteria, UA NONE SEEN NONE SEEN   Squamous Epithelial / LPF 0-5 (A) NONE SEEN   Hyaline Casts, UA PRESENT     Comment: Performed at [facility], [place] [address] [place], [place] [address]  Influenza panel by PCR (type A & B)     Status: None   Collection Time: 11/21/17  8:03 AM  Result Value Ref Range   Influenza A By PCR NEGATIVE NEGATIVE   Influenza B By PCR NEGATIVE NEGATIVE    Comment: (NOTE) The Xpert Xpress Flu assay is intended as an aid in the diagnosis of  influenza and should not be used as a sole basis for treatment.  This  assay is FDA approved for nasopharyngeal swab specimens only. Nasal  washings and aspirates are unacceptable for Xpert Xpress Flu testing. Performed at [facility], [place] [address], [place], [place] [address]   CBG monitoring, ED     Status: Abnormal   Collection Time: 11/21/17  8:26 AM  Result Value Ref Range   Glucose-Capillary 61 (L) 65 - 99 mg/dL  Troponin I     Status: Abnormal   Collection  Time: 11/21/17 10:48 AM  Result Value Ref Range   Troponin I 0.06 (HH) <0.03 ng/mL    Comment: CRITICAL VALUE NOTED.  VALUE IS CONSISTENT WITH PREVIOUSLY REPORTED AND CALLED VALUE. Performed at [facility], [place] [address], [place], [place] [address]   CBG monitoring, ED     Status: Abnormal   Collection Time: 11/21/17 11:54 AM  Result Value Ref Range   Glucose-Capillary 167 (H) 65 - 99 mg/dL   Comment 1 Notify RN    Comment 2 Document in Chart    Dg Chest Port 1 View  Result Date: 11/20/2017 CLINICAL DATA:  Increased weakness over the last few days. EXAM: PORTABLE CHEST 1 VIEW COMPARISON:  Chest radiograph 10/30/2017.  FINDINGS: Monitoring leads overlie the patient. Stable cardiac and mediastinal contours. Stent graft Re demonstrated. No consolidative pulmonary opacities. No pleural effusion or pneumothorax. Healing bilateral rib fractures. IMPRESSION: No acute cardiopulmonary process. Electronically Signed   By: Lovey Newcomer M.D.   On: 11/20/2017 18:30    Pending Labs Unresulted Labs (From admission, onward)   Start     Ordered   11/27/17 0500  Creatinine, serum  (enoxaparin (LOVENOX)    CrCl >/= 30 ml/min)  Weekly,   R    Comments:  while on enoxaparin therapy    11/20/17 2058   11/22/17 0500  CBC  Tomorrow morning,   R    Question:  Specimen collection method  Answer:  Lab=Lab collect   11/21/17 0841   11/22/17 2482  Basic metabolic panel  Tomorrow morning,   R    Question:  Specimen collection method  Answer:  Lab=Lab collect   11/21/17 0841   11/20/17 2135  CBC  Once,   R     11/20/17 2135   11/20/17 2013  Urine culture  STAT,   STAT     11/20/17 2013   11/20/17 1759  Blood culture (routine x 2)  BLOOD CULTURE X 2,   STAT,   Status:  Canceled     11/20/17 1758      Vitals/Pain Today's Vitals   11/21/17 1201 11/21/17 1230 11/21/17 1300 11/21/17 1330  BP: (!) 115/25 127/64 (!) 120/57 (!) 111/48  Pulse: 81 78 85 81  Resp: (!) '27 17 16 16  ' Temp:       TempSrc:      SpO2: 100% 100% 98% 100%  Weight:      PainSc:        Isolation Precautions Droplet precaution  Medications Medications  bisoprolol (ZEBETA) tablet 2.5 mg (2.5 mg Oral Given 11/21/17 0929)  buPROPion (WELLBUTRIN) tablet 100 mg (100 mg Oral Given 11/21/17 0929)  HYDROcodone-acetaminophen (NORCO/VICODIN) 5-325 MG per tablet 1 tablet (not administered)  mirtazapine (REMERON) tablet 30 mg (30 mg Oral Given 11/21/17 0112)  nortriptyline (PAMELOR) capsule 75 mg (75 mg Oral Given 11/21/17 0133)  polyethylene glycol (MIRALAX / GLYCOLAX) packet 17 g (17 g Oral Given 11/21/17 0929)  senna-docusate (Senokot-S) tablet 1 tablet (1 tablet Oral Given 11/21/17 0112)  latanoprost (XALATAN) 0.005 % ophthalmic solution 1 drop (1 drop Both Eyes Given 11/21/17 0114)  acetaminophen (TYLENOL) tablet 650 mg (not administered)    Or  acetaminophen (TYLENOL) suppository 650 mg (not administered)  ondansetron (ZOFRAN) tablet 4 mg (not administered)    Or  ondansetron (ZOFRAN) injection 4 mg (not administered)  enoxaparin (LOVENOX) injection 30 mg (30 mg Subcutaneous Given 11/21/17 0113)  0.9 %  sodium chloride infusion ( Intravenous Stopped 11/21/17 1054)  hydrocortisone sodium succinate (SOLU-CORTEF) 100 MG injection 50 mg (50 mg Intravenous Given 11/21/17 1340)  insulin aspart (novoLOG) injection 0-9 Units (2 Units Subcutaneous Given 11/21/17 1222)  dextrose 5 %-0.45 % sodium chloride infusion ( Intravenous New Bag/Given 11/21/17 0856)  sodium chloride 0.9 % bolus 1,000 mL (0 mLs Intravenous Stopped 11/20/17 2058)    And  sodium chloride 0.9 % bolus 500 mL (0 mLs Intravenous Stopped 11/20/17 2358)  levofloxacin (LEVAQUIN) IVPB 750 mg (0 mg Intravenous Stopped 11/20/17 2149)  vancomycin (VANCOCIN) IVPB 1000 mg/200 mL premix (0 mg Intravenous Stopped 11/21/17 0043)  meropenem (MERREM) 1 g in sodium chloride 0.9 % 100 mL IVPB (0 g Intravenous Stopped 11/20/17 2331)    Mobility walks with person assist

## 2017-11-21 NOTE — ED Notes (Signed)
Attempted blood draw without success
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
## 2017-11-22 DIAGNOSIS — R6510 Systemic inflammatory response syndrome (SIRS) of non-infectious origin without acute organ dysfunction: Secondary | ICD-10-CM | POA: Diagnosis not present

## 2017-11-22 DIAGNOSIS — Z7952 Long term (current) use of systemic steroids: Secondary | ICD-10-CM | POA: Diagnosis not present

## 2017-11-22 DIAGNOSIS — Z79899 Other long term (current) drug therapy: Secondary | ICD-10-CM | POA: Diagnosis not present

## 2017-11-22 DIAGNOSIS — F05 Delirium due to known physiological condition: Secondary | ICD-10-CM | POA: Diagnosis present

## 2017-11-22 DIAGNOSIS — J8410 Pulmonary fibrosis, unspecified: Secondary | ICD-10-CM | POA: Diagnosis present

## 2017-11-22 DIAGNOSIS — Z880 Allergy status to penicillin: Secondary | ICD-10-CM | POA: Diagnosis not present

## 2017-11-22 DIAGNOSIS — E876 Hypokalemia: Secondary | ICD-10-CM | POA: Diagnosis not present

## 2017-11-22 DIAGNOSIS — I10 Essential (primary) hypertension: Secondary | ICD-10-CM | POA: Diagnosis present

## 2017-11-22 DIAGNOSIS — R402252 Coma scale, best verbal response, oriented, at arrival to emergency department: Secondary | ICD-10-CM | POA: Diagnosis present

## 2017-11-22 DIAGNOSIS — F419 Anxiety disorder, unspecified: Secondary | ICD-10-CM | POA: Diagnosis present

## 2017-11-22 DIAGNOSIS — J449 Chronic obstructive pulmonary disease, unspecified: Secondary | ICD-10-CM | POA: Diagnosis present

## 2017-11-22 DIAGNOSIS — F329 Major depressive disorder, single episode, unspecified: Secondary | ICD-10-CM | POA: Diagnosis present

## 2017-11-22 DIAGNOSIS — M069 Rheumatoid arthritis, unspecified: Secondary | ICD-10-CM | POA: Diagnosis present

## 2017-11-22 DIAGNOSIS — Z91048 Other nonmedicinal substance allergy status: Secondary | ICD-10-CM | POA: Diagnosis not present

## 2017-11-22 DIAGNOSIS — H9190 Unspecified hearing loss, unspecified ear: Secondary | ICD-10-CM | POA: Diagnosis present

## 2017-11-22 DIAGNOSIS — E860 Dehydration: Secondary | ICD-10-CM | POA: Diagnosis present

## 2017-11-22 DIAGNOSIS — M810 Age-related osteoporosis without current pathological fracture: Secondary | ICD-10-CM | POA: Diagnosis present

## 2017-11-22 DIAGNOSIS — R531 Weakness: Secondary | ICD-10-CM | POA: Diagnosis present

## 2017-11-22 DIAGNOSIS — R402142 Coma scale, eyes open, spontaneous, at arrival to emergency department: Secondary | ICD-10-CM | POA: Diagnosis present

## 2017-11-22 DIAGNOSIS — Z952 Presence of prosthetic heart valve: Secondary | ICD-10-CM | POA: Diagnosis not present

## 2017-11-22 DIAGNOSIS — K8689 Other specified diseases of pancreas: Secondary | ICD-10-CM | POA: Diagnosis present

## 2017-11-22 DIAGNOSIS — E2740 Unspecified adrenocortical insufficiency: Secondary | ICD-10-CM | POA: Diagnosis present

## 2017-11-22 DIAGNOSIS — R402362 Coma scale, best motor response, obeys commands, at arrival to emergency department: Secondary | ICD-10-CM | POA: Diagnosis present

## 2017-11-22 DIAGNOSIS — E11649 Type 2 diabetes mellitus with hypoglycemia without coma: Secondary | ICD-10-CM | POA: Diagnosis present

## 2017-11-22 DIAGNOSIS — I951 Orthostatic hypotension: Secondary | ICD-10-CM | POA: Diagnosis present

## 2017-11-22 LAB — URINE CULTURE: CULTURE: NO GROWTH

## 2017-11-22 LAB — CBC
HEMATOCRIT: 33.3 % — AB (ref 36.0–46.0)
Hemoglobin: 10.8 g/dL — ABNORMAL LOW (ref 12.0–15.0)
MCH: 30.3 pg (ref 26.0–34.0)
MCHC: 32.4 g/dL (ref 30.0–36.0)
MCV: 93.3 fL (ref 78.0–100.0)
Platelets: 80 10*3/uL — ABNORMAL LOW (ref 150–400)
RBC: 3.57 MIL/uL — ABNORMAL LOW (ref 3.87–5.11)
RDW: 14.7 % (ref 11.5–15.5)
WBC: 11.5 10*3/uL — AB (ref 4.0–10.5)

## 2017-11-22 LAB — BASIC METABOLIC PANEL
ANION GAP: 8 (ref 5–15)
BUN: 18 mg/dL (ref 6–20)
CALCIUM: 7.5 mg/dL — AB (ref 8.9–10.3)
CO2: 26 mmol/L (ref 22–32)
Chloride: 104 mmol/L (ref 101–111)
Creatinine, Ser: 0.46 mg/dL (ref 0.44–1.00)
GFR calc Af Amer: >60 mL/min (ref >60)
GFR calc non Af Amer: >60 mL/min (ref >60)
GLUCOSE: 131 mg/dL — AB (ref 65–99)
Potassium: 2.6 mmol/L — CL (ref 3.5–5.1)
Sodium: 138 mmol/L (ref 135–145)

## 2017-11-22 LAB — GLUCOSE, CAPILLARY
GLUCOSE-CAPILLARY: 114 mg/dL — AB (ref 65–99)
Glucose-Capillary: 121 mg/dL — ABNORMAL HIGH (ref 65–99)
Glucose-Capillary: 117 mg/dL — ABNORMAL HIGH (ref 65–99)
Glucose-Capillary: 130 mg/dL — ABNORMAL HIGH (ref 65–99)

## 2017-11-22 MED ORDER — POTASSIUM CHLORIDE CRYS ER 20 MEQ PO TBCR
40.0000 meq | EXTENDED_RELEASE_TABLET | Freq: Once | ORAL | Status: AC
  Administered 2017-11-22: 40 meq via ORAL
  Filled 2017-11-22: qty 2

## 2017-11-22 MED ORDER — HYDROCORTISONE NA SUCCINATE PF 100 MG IJ SOLR
50.0000 mg | Freq: Two times a day (BID) | INTRAMUSCULAR | Status: DC
  Administered 2017-11-23 – 2017-11-24 (×4): 50 mg via INTRAVENOUS
  Filled 2017-11-22 (×4): qty 2

## 2017-11-22 MED ORDER — KCL IN DEXTROSE-NACL 40-5-0.45 MEQ/L-%-% IV SOLN
INTRAVENOUS | Status: DC
  Administered 2017-11-22 – 2017-11-23 (×2): via INTRAVENOUS
  Filled 2017-11-22 (×2): qty 1000

## 2017-11-22 MED ORDER — QUETIAPINE FUMARATE 25 MG PO TABS
25.0000 mg | ORAL_TABLET | Freq: Every evening | ORAL | Status: DC
  Administered 2017-11-22: 25 mg via ORAL
  Filled 2017-11-22: qty 1

## 2017-11-22 NOTE — Progress Notes (Signed)
CRITICAL VALUE ALERT  Critical Value:  K 2.6  Date & Time Notied:  11/22/2017 1100  Provider Notified: Dr. Shon Hale  Orders Received/Actions taken: awaiting page back

## 2017-11-22 NOTE — Evaluation (Signed)
Physical Therapy Evaluation Patient Details Name: [name] MRN: [id number] DOB: 26-Dec-1934 Today's Date: 11/22/2017   History of Present Illness   83 y.o. female with history of rheumatoid arthritis ,, adrenal insufficiency, hypertension, aortic stenosis admitted 11/20/17 from skilled nursing facility with Generalized Weakness/Fatigue and Orthostatic Hypotension. recently treated for ESBL ecoli.  Clinical Impression  The patient is pleasantly confused. Mobilized with 1 assist to recliner and ambulated x 8'. [facility] Recommend return there after Dc. Pt admitted with above diagnosis. Pt currently with functional limitations due to the deficits listed below (see PT Problem List).  Pt will benefit from skilled PT to increase their independence and safety with mobility to allow discharge to the venue listed below.       Follow Up Recommendations SNF    Equipment Recommendations  None recommended by PT    Recommendations for Other Services       Precautions / Restrictions Precautions Precautions: Fall      Mobility  Bed Mobility Overal bed mobility: Needs Assistance Bed Mobility: Supine to Sit     Supine to sit: Mod assist     General bed mobility comments: Increased time with cues for sequence and assist to complete transition to EOB  Transfers Overall transfer level: Needs assistance Equipment used: Rolling walker (2 wheeled) Transfers: Sit to/from Stand Sit to Stand: Mod assist         General transfer comment: cues for transition position and use of UEs to self assist.  Physical assist to bring wt up and fwd and to balance in initial standing.`  Ambulation/Gait Ambulation/Gait assistance: Mod assist Ambulation Distance (Feet): 8 Feet Assistive device: Rolling walker (2 wheeled) Gait Pattern/deviations: Step-to pattern;Decreased step length - right;Decreased step length - left;Shuffle;Trunk flexed Gait velocity: decr   General Gait Details:  Assist to stabilize throughout distance. Cues for safety, distance from RW.  oxygen saturation on 2 liters 100%  Stairs            Wheelchair Mobility    Modified Rankin (Stroke Patients Only)       Balance Overall balance assessment: Needs assistance Sitting-balance support: Feet supported Sitting balance-Leahy Scale: Fair     Standing balance support: Bilateral upper extremity supported Standing balance-Leahy Scale: Poor                               Pertinent Vitals/Pain Faces Pain Scale: Hurts a little bit Pain Location: throat Pain Descriptors / Indicators: Discomfort;Grimacing Pain Intervention(s): Monitored during session    Home Living Family/patient expects to be discharged to:: Skilled nursing facility               Home Equipment: Dan Humphreys - 2 wheels;Walker - 4 wheels;Shower seat;Bedside commode      Prior Function                 Hand Dominance        Extremity/Trunk Assessment   Upper Extremity Assessment Upper Extremity Assessment: Generalized weakness LUE Deficits / Details: ltd use of shoulder 2* failed rotator cuff surgery    Lower Extremity Assessment Lower Extremity Assessment: Generalized weakness    Cervical / Trunk Assessment Cervical / Trunk Assessment: Kyphotic  Communication      Cognition Arousal/Alertness: Awake/alert   Overall Cognitive Status: History of cognitive impairments - at baseline Area of Impairment: Orientation  Orientation Level: Place;Time;Situation                    General Comments      Exercises     Assessment/Plan    PT Assessment Patient needs continued PT services  PT Problem List Decreased strength;Decreased range of motion;Decreased activity tolerance;Decreased balance;Decreased mobility;Decreased knowledge of use of DME;Decreased cognition;Pain       PT Treatment Interventions DME instruction;Gait training;Functional mobility  training;Therapeutic activities;Balance training;Patient/family education;Therapeutic exercise    PT Goals (Current goals can be found in the Care Plan section)  Acute Rehab PT Goals Patient Stated Goal: did not state  PT Goal Formulation: With patient Time For Goal Achievement: 12/06/17 Potential to Achieve Goals: Good    Frequency Min 2X/week   Barriers to discharge   return to SNF    Co-evaluation               AM-PAC PT "6 Clicks" Daily Activity  Outcome Measure Difficulty turning over in bed (including adjusting bedclothes, sheets and blankets)?: A Lot Difficulty moving from lying on back to sitting on the side of the bed? : A Lot Difficulty sitting down on and standing up from a chair with arms (e.g., wheelchair, bedside commode, etc,.)?: Unable Help needed moving to and from a bed to chair (including a wheelchair)?: Total Help needed walking in hospital room?: Total Help needed climbing 3-5 steps with a railing? : Total 6 Click Score: 8    End of Session Equipment Utilized During Treatment: Gait belt Activity Tolerance: Patient tolerated treatment well Patient left: in chair;with call bell/phone within reach;with chair alarm set Nurse Communication: Mobility status PT Visit Diagnosis: Muscle weakness (generalized) (M62.81);Difficulty in walking, not elsewhere classified (R26.2)    Time: 1500-1530 PT Time Calculation (min) (ACUTE ONLY): 30 min   Charges:   PT Evaluation $PT Eval Low Complexity: 1 Low PT Treatments $Gait Training: 8-22 mins   PT G Codes:        {Jillian Warth PT [phone]   Rada Hay 11/22/2017, 3:56 PM

## 2017-11-22 NOTE — Evaluation (Signed)
Clinical/Bedside Swallow Evaluation Patient Details  Name: [name] MRN: [id number] Date of Birth: [birth year]  Today's Date: 11/22/2017 Time: SLP Start Time (ACUTE ONLY): 1401 SLP Stop Time (ACUTE ONLY): 1422 SLP Time Calculation (min) (ACUTE ONLY): 21 min  Past Medical History:  Past Medical History:  Diagnosis Date  . Adrenal insufficiency, primary, iatrogenic   . Anxiety   . Aortic stenosis, severe 02/26/2013  . Chest pain    myoview 04/13/04-inferoapical thinning with a suggestion of mild apical ischemia, EF 66%  . Depression 03/25/2013  . Gait instability   . Hearing loss 03/25/2013  . Memory deficit 03/25/2013  . Murmur, cardiac   . Osteoporosis   . Palpitations   . Pancreatic insufficiency 11/09/2012  . Physical deconditioning   . Pneumonia   . Pulmonary fibrosis (HCC) 03/25/2013  . Rheumatoid arthritis(714.0)   . SOBOE (shortness of breath on exertion)    echo 02/23/13-EF 55-60%, possible bicuspid aortic valve with mod to severed stenosis   . Syncope   . UTI (lower urinary tract infection) 10/01/2011   Past Surgical History:  Past Surgical History:  Procedure Laterality Date  . ABDOMINAL HYSTERECTOMY    . CARDIAC CATHETERIZATION  11/08/96   no evidence of CAD, nl LV function  . CHOLECYSTECTOMY    . CORONARY ANGIOGRAM  03/22/2013   Procedure: CORONARY ANGIOGRAM;  Surgeon: [name], MD;  Location: [facility] CATH LAB;  Service: Cardiovascular;;  . KNEE ARTHROSCOPY     2 on left  . RIGHT HEART CATHETERIZATION  03/22/2013   Procedure: RIGHT HEART CATH;  Surgeon: [name], MD;  Location: [facility] CATH LAB;  Service: Cardiovascular;;  . SHOULDER SURGERY Left   . THYROIDECTOMY     HPI:  84 y.o. female with history of rheumatoid arthritis on immunosuppressant, adrenal insufficiency, hypertension, aortic stenosis status post TAVR was referred to the ER from skilled nursing facility after patient was found to be weak fatigue is ongoing for last few days with patient being  orthostatic.  Patient states he has lost her voice last few days and states he has been feeling weak.  Has been coughing denies any chest pain.  Denies any nausea vomiting or diarrhea. Pt indicated when taking medication, a capsule "came open and burned her mouth/throat" causing loss of voice and pain with swallowing; 11/20/17 CXR unremarkable.  Assessment / Plan / Recommendation Clinical Impression   Pt appears to exhibit a normal oropharyngeal swallow with timely swallow indicated, but pt is experiencing pain with swallowing (odynophagia) d/t recent medication dose where capsule opened and caused pain when swallowed; loss of voice followed incident as well, but pt is beginning to regain and improve overall vocal quality as voice fluctuated from aphonic to hoarse during BSE; pt's primary complaint is "not being able to communicate" vs dysphagia; ST will f/u x1 with a meal/snack to ensure swallowing safety/efficiency; recommend Regular consistency (heart healthy) diet with thin liquids. SLP Visit Diagnosis: Dysphagia, unspecified (R13.10)    Aspiration Risk  No limitations    Diet Recommendation   Regular/thin liquids heart healthy)  Medication Administration: Whole meds with puree(for pain management)    Other  Recommendations Oral Care Recommendations: Oral care BID   Follow up Recommendations None      Frequency and Duration min 1 x/week  1 week       Prognosis Prognosis for Safe Diet Advancement: Good      Swallow Study   General Date of Onset: 11/20/17 HPI: 83 y.o. female with history  of rheumatoid arthritis on immunosuppressant, adrenal insufficiency, hypertension, aortic stenosis status post TAVR was referred to the ER from skilled nursing facility after patient was found to be weak fatigue is ongoing for last few days with patient being orthostatic.  Patient states he has lost her voice last few days and states he has been feeling weak.  Has been coughing denies any chest pain.   Denies any nausea vomiting or diarrhea. Type of Study: Bedside Swallow Evaluation Previous Swallow Assessment: none indicated Diet Prior to this Study: Regular;Thin liquids Temperature Spikes Noted: No Respiratory Status: Nasal cannula History of Recent Intubation: No Behavior/Cognition: Alert;Cooperative;Pleasant mood Oral Cavity Assessment: Within Functional Limits Oral Care Completed by SLP: No Oral Cavity - Dentition: Missing dentition Vision: Functional for self-feeding Self-Feeding Abilities: Able to feed self Patient Positioning: Upright in bed Baseline Vocal Quality: Aphonic;Hoarse Volitional Cough: Strong Volitional Swallow: Able to elicit    Oral/Motor/Sensory Function Overall Oral Motor/Sensory Function: Within functional limits   Ice Chips Ice chips: Within functional limits Presentation: Self Fed   Thin Liquid Thin Liquid: Within functional limits Presentation: Cup;Straw    Nectar Thick Nectar Thick Liquid: Not tested   Honey Thick Honey Thick Liquid: Not tested   Puree Puree: Within functional limits Presentation: Self Fed   Solid      Solid: Within functional limits Presentation: Self Fed Other Comments: pain intermittently with solids in pharynx        Tressie Stalker, M.S., CCC-SLP 11/22/2017,4:29 PM

## 2017-11-22 NOTE — Progress Notes (Signed)
Patient Demographics:    [name], is a 83 y.o. female, DOB - July 17, 1935, [id number]  Admit date - 11/20/2017   Admitting Physician Eduard Clos, MD  Outpatient Primary MD for the patient is Jarome Matin, MD  LOS - 0   No chief complaint on file.       Subjective:    [name] today has no fevers, no emesis,  No chest pain, no diarrhea, appetite is poor, patient had episode of sundowning overnight required Haldol for sedation  Assessment  & Plan :    Principal Problem:   SIRS (systemic inflammatory response syndrome) (HCC) Active Problems:   Rheumatoid arthritis involving multiple joints (HCC)   DM2 (diabetes mellitus, type 2) (HCC)   Chronic adrenal insufficiency (HCC)   Pancreatic insufficiency (HCC)   Memory deficit   Pulmonary fibrosis (HCC)   COPD (chronic obstructive pulmonary disease) (HCC)   S/P TAVR (transcatheter aortic valve replacement)  Brief Summary:-  83 y.o. female with history of rheumatoid arthritis on immunosuppressant, adrenal insufficiency, hypertension, aortic stenosis status post TAVR admitted 11/20/17 from skilled nursing facility with Generalized Weakness/Fatigue and Orthostatic Hypotension.  Patient states she has lost her voice last few days after Nortriptyline capsule opened up and "burnt" her Throat .  Recently treated for ESBL E. coli   Plan:- 1)SIRS- No fevers, doubt frank sepsis, lactic acid was elevated most likely due to dehydration, pro- calcitonin was not elevated, patient has leukocytosis most likely due to steroids, blood and urine cultures from 11/20/2017 are negative so far ,  no further antibiotics for now (initially received vancomycin, Levaquin and Meropenem)  2)H/o HTN-continue bisoprolol 2.5 mg daily, watch BP closely, decreased nortriptyline to 25 mg nightly from 75 mg nightly to reduce orthostatic concerns  3)DM-resolved  hypoglycemia secondary to poor oral intake, anticipate hyperglycemia and due to stress dose steroids  4)COPD-no acute exacerbation at this time, patient currently on stress dose steroids, continue bronchodilators  5)RA-hold immunosuppressants, patient currently on stress dose steroids  6)History of adrenal insufficiency-completed Solu-Cortef  [stress dose steroids], okay to use prednisone taper, hypokalemia noted, replaced with recheck  7)Dehydration-on admission patient's BUN was up to 51 with a creatinine of 1.0, please note that 2 weeks ago BUN and creatinine were 17 and 0.9, I suspect dehydration and prerenal azotemia explains elevated lactic acid noted #1 above, BUN/creatinine is down to 18 at 0.46 respectively  8)Neuropsych-patient had episode of sundowning overnight required Haldol for sedation  Code Status : Full code  Disposition Plan  : SNF if blood and urine cultures remain negative    DVT Prophylaxis  :  STOP Lovenox , use SCD, low platelets noted  Lab Results  Component Value Date   PLT 80 (L) 11/22/2017    Inpatient Medications  Scheduled Meds: . bisoprolol  2.5 mg Oral Daily  . buPROPion  100 mg Oral q morning - 10a  . enoxaparin (LOVENOX) injection  30 mg Subcutaneous QHS  . [START ON 11/23/2017] hydrocortisone sod succinate (SOLU-CORTEF) inj  50 mg Intravenous Q12H  . insulin aspart  0-9 Units Subcutaneous TID WC  . latanoprost  1 drop Both Eyes QHS  . mirtazapine  30 mg Oral QHS  . nortriptyline  25 mg Oral QHS  .  polyethylene glycol  17 g Oral Daily  . potassium chloride  40 mEq Oral Once  . potassium chloride  40 mEq Oral Once  . QUEtiapine  25 mg Oral QPM   Continuous Infusions: . dextrose 5 % and 0.45 % NaCl with KCl 40 mEq/L     PRN Meds:.acetaminophen **OR** acetaminophen, HYDROcodone-acetaminophen, ondansetron **OR** ondansetron (ZOFRAN) IV, senna-docusate    Anti-infectives (From admission, onward)   Start     Dose/Rate Route Frequency Ordered  Stop   11/22/17 2200  vancomycin (VANCOCIN) IVPB 750 mg/150 ml premix  Status:  Discontinued     750 mg 150 mL/hr over 60 Minutes Intravenous Every 48 hours 11/20/17 2134 11/21/17 0840   11/21/17 1000  meropenem (MERREM) 1 g in sodium chloride 0.9 % 100 mL IVPB  Status:  Discontinued     1 g 200 mL/hr over 30 Minutes Intravenous Every 12 hours 11/20/17 2133 11/21/17 0840   11/20/17 2145  meropenem (MERREM) 1 g in sodium chloride 0.9 % 100 mL IVPB     1 g 200 mL/hr over 30 Minutes Intravenous  Once 11/20/17 2131 11/20/17 2331   11/20/17 1945  aztreonam (AZACTAM) 2 GM IVPB  Status:  Discontinued     2 g 100 mL/hr over 30 Minutes Intravenous  Once 11/20/17 1931 11/20/17 2131   11/20/17 1930  levofloxacin (LEVAQUIN) IVPB 750 mg     750 mg 100 mL/hr over 90 Minutes Intravenous  Once 11/20/17 1927 11/20/17 2149   11/20/17 1930  aztreonam (AZACTAM) 2 g in dextrose 5 % 50 mL IVPB  Status:  Discontinued     2 g 100 mL/hr over 30 Minutes Intravenous  Once 11/20/17 1927 11/20/17 1931   11/20/17 1930  vancomycin (VANCOCIN) IVPB 1000 mg/200 mL premix     1,000 mg 200 mL/hr over 60 Minutes Intravenous  Once 11/20/17 1927 11/21/17 0043        Objective:   Vitals:   11/21/17 2139 11/22/17 0500 11/22/17 0512 11/22/17 1242  BP: (!) 116/57  135/83 140/83  Pulse: 93  (!) 104 (!) 108  Resp: 18  18 16   Temp: 97.6 F (36.4 C)  97.8 F (36.6 C) 97.9 F (36.6 C)  TempSrc: Oral  Oral Oral  SpO2: 98%  95% 100%  Weight:  47.6 kg (104 lb 15 oz)    Height:        Wt Readings from Last 3 Encounters:  11/22/17 47.6 kg (104 lb 15 oz)  10/31/17 49.3 kg (108 lb 11 oz)  10/19/17 54.6 kg (120 lb 5.9 oz)     Intake/Output Summary (Last 24 hours) at 11/22/2017 1456 Last data filed at 11/22/2017 1242 Gross per 24 hour  Intake 2395 ml  Output -  Net 2395 ml     Physical Exam  Gen:- Awake Alert,  In no apparent distress , low pitched voice, sleepy HEENT:- [place].AT, No sclera icterus Neck-Supple Neck,No  JVD,.  Lungs-diminished in bases without wheezing  CV- S1, S2 normal Abd-  +ve B.Sounds, Abd Soft, No tenderness,    Extremity/Skin:- No  edema,    Neuropsych-generalized weakness without new focal deficits, more sleepy after Haldol   Data Review:   Micro Results Recent Results (from the past 240 hour(s))  Blood culture (routine x 2)     Status: None (Preliminary result)   Collection Time: 11/20/17  6:38 PM  Result Value Ref Range Status   Specimen Description   Final    BLOOD BLOOD LEFT FOREARM Performed  at [facility], [address] [place], [place] [address]    Special Requests   Final    IN PEDIATRIC BOTTLE Blood Culture adequate volume Performed at [facility], [address] [place], [place] [address]    Culture   Final    NO GROWTH 2 DAYS Performed at [facility], [address], [place], [place] [address]    Report Status PENDING  Incomplete  Urine culture     Status: None   Collection Time: 11/20/17  8:38 PM  Result Value Ref Range Status   Specimen Description   Final    URINE, RANDOM Performed at [facility] [address] [place], [place] [address]    Special Requests   Final    NONE Performed at [facility], [address], [place], [place] [address]    Culture   Final    NO GROWTH Performed at [facility], [address], [place], [place] [address]    Report Status 11/22/2017 FINAL  Final    Radiology Reports Dg Chest 2 View  Result Date: 10/30/2017 CLINICAL DATA:  Status post possible fall with injury to the ribs. EXAM: CHEST  2 VIEW COMPARISON:  10/19/2017 FINDINGS: Prior sending aortic stent graft repair. Cardiomediastinal silhouette is normal. Mediastinal contours appear intact. Calcific atherosclerotic disease of the aorta. Stable elevation of the left hemidiaphragm. There is no evidence of focal airspace consolidation, pleural effusion or pneumothorax.  Osseous structures are without acute abnormality. Thoracic kyphosis with stable anterior compression deformity of T6 vertebral body. Minimally displaced left fourth posterior rib fracture. Soft tissues are grossly normal. IMPRESSION: Minimally displaced left 4th posterior rib fracture. No evidence of pneumothorax. Electronically Signed   By: Ted Mcalpine M.D.   On: 10/30/2017 20:01   Ct Head Wo Contrast  Result Date: 10/30/2017 CLINICAL DATA:  Status post fall. EXAM: CT HEAD WITHOUT CONTRAST TECHNIQUE: Contiguous axial images were obtained from the base of the skull through the vertex without intravenous contrast. COMPARISON:  10/14/2015 FINDINGS: Brain: No evidence of acute infarction, hemorrhage, hydrocephalus, extra-axial collection or mass lesion/mass effect. Moderate brain parenchymal volume loss and deep white matter microangiopathy. Chronic lacunar infarct of the right thalamus. Vascular: Atherosclerotic calcifications at the intra cavernous carotid arteries. Skull: Normal. Negative for fracture or focal lesion. Sinuses/Orbits: No acute finding. Other: None. IMPRESSION: No evidence of acute traumatic injury to head. Moderate brain parenchymal atrophy and chronic microvascular disease. Chronic right thalamic lacunar infarct. Electronically Signed   By: Ted Mcalpine M.D.   On: 10/30/2017 20:41   Ct Chest Wo Contrast  Result Date: 10/30/2017 CLINICAL DATA:  Fall with rib pain. EXAM: CT CHEST WITHOUT CONTRAST TECHNIQUE: Multidetector CT imaging of the chest was performed following the standard protocol without IV contrast. COMPARISON:  05/27/2015 FINDINGS: Cardiovascular: Heart size upper normal to mildly enlarged. No pericardial effusion. Patient is status post aortic valve replacement. Coronary artery calcification is evident. Atherosclerotic calcification is noted in the wall of the thoracic aorta. Prominence of the main pulmonary artery raises the question of pulmonary arterial  hypertension. Mediastinum/Nodes: No mediastinal lymphadenopathy. No evidence for gross hilar lymphadenopathy although assessment is limited by the lack of intravenous contrast on today's study. Lungs/Pleura: Evaluation of lung parenchyma is obscured scratchy at fine detail of lung parenchyma is obscured by patient breathing motion during image acquisition. Probable emphysema with some interlobular septal thickening in the lung apices. Patchy atelectasis or pneumonia noted posterior left  lower lobe. No pneumothorax. No pleural effusion. Upper Abdomen: Unremarkable. Musculoskeletal: Multiple left-sided rib fractures of varying age are identified. Anterior old fractures of lower left ribs evident. Fractures of the posterior tenth, eleventh, and twelfth ribs on the left are nonacute as there is evidence of bony callus, but these are not yet healed. Old anterior right rib fractures identified in the lower ribs but incompletely visualized. IMPRESSION: 1. Posterior fractures of the left tenth, eleventh, and twelfth ribs are at least subacute as bony callus is evident but the fracture lines are still visible consistent with incomplete healing. 2. Old healed fractures are identified in the anterior lower ribs bilaterally. 3. No pneumothorax or pleural effusion. 4. Mild patchy airspace opacity posterior left lower lobe compatible with atelectasis or pneumonia. 5.  Aortic Atherosclerois (ICD10-170.0) 6. Question pulmonary arterial hypertension. Electronically Signed   By: Kennith Center M.D.   On: 10/30/2017 20:44   Dg Chest Port 1 View  Result Date: 11/20/2017 CLINICAL DATA:  Increased weakness over the last few days. EXAM: PORTABLE CHEST 1 VIEW COMPARISON:  Chest radiograph 10/30/2017. FINDINGS: Monitoring leads overlie the patient. Stable cardiac and mediastinal contours. Stent graft Re demonstrated. No consolidative pulmonary opacities. No pleural effusion or pneumothorax. Healing bilateral rib fractures. IMPRESSION: No  acute cardiopulmonary process. Electronically Signed   By: Annia Belt M.D.   On: 11/20/2017 18:30     CBC Recent Labs  Lab 11/20/17 1734 11/21/17 0410 11/22/17 0932  WBC 16.5* 16.5* 11.5*  HGB 12.9 11.5* 10.8*  HCT 38.9 34.3* 33.3*  PLT 127* 110* 80*  MCV 92.4 92.0 93.3  MCH 30.6 30.8 30.3  MCHC 33.2 33.5 32.4  RDW 14.7 14.9 14.7  LYMPHSABS 0.8  --   --   MONOABS 0.4  --   --   EOSABS 0.0  --   --   BASOSABS 0.0  --   --     Chemistries  Recent Labs  Lab 11/20/17 1734 11/20/17 2119 11/21/17 0410 11/22/17 0932  NA 135  --  139 138  K 3.7  --  3.9 2.6*  CL 94*  --  104 104  CO2 27  --  26 26  GLUCOSE 191*  --  79 131*  BUN 51*  --  39* 18  CREATININE 1.10* <0.30* 0.80 0.46  CALCIUM 8.1*  --  7.3* 7.5*  AST 25  --   --   --   ALT 16  --   --   --   ALKPHOS 89  --   --   --   BILITOT 1.0  --   --   --    ------------------------------------------------------------------------------------------------------------------ No results for input(s): CHOL, HDL, LDLCALC, TRIG, CHOLHDL, LDLDIRECT in the last 72 hours.  Lab Results  Component Value Date   HGBA1C 5.0 11/01/2017   ------------------------------------------------------------------------------------------------------------------ No results for input(s): TSH, T4TOTAL, T3FREE, THYROIDAB in the last 72 hours.  Invalid input(s): FREET3 ------------------------------------------------------------------------------------------------------------------ No results for input(s): VITAMINB12, FOLATE, FERRITIN, TIBC, IRON, RETICCTPCT in the last 72 hours.  Coagulation profile No results for input(s): INR, PROTIME in the last 168 hours.  No results for input(s): DDIMER in the last 72 hours.  Cardiac Enzymes Recent Labs  Lab 11/20/17 2119 11/21/17 0249 11/21/17 1048  TROPONINI <0.03 0.07* 0.06*   ------------------------------------------------------------------------------------------------------------------      Component Value Date/Time   BNP 65.2 03/23/2014 1429     Whitlee Sluder M.D on 11/22/2017 at 2:56 PM  Between 7am to 7pm - Pager - [phone]  After 7pm go to [email or web address] - password [email or web address]  [facility] -  Office  [phone]   Voice Recognition Viviann Spare dictation system was used to create this note, attempts have been made to correct errors. Please contact the author with questions and/or clarifications.

## 2017-11-22 NOTE — Progress Notes (Signed)
PT Cancellation Note  Patient Details Name: [name] MRN: [id number] DOB: Aug 01, 1935   Cancelled Treatment:    Reason Eval/Treat Not Completed: Fatigue/lethargy limiting ability to participate, per RN, received haldol overnight, now lethargic. Will check back later today when aroused.   Rada Hay 11/22/2017, 8:57 AM Blanchard Kelch PT [phone]

## 2017-11-23 LAB — BASIC METABOLIC PANEL
Anion gap: 4 — ABNORMAL LOW (ref 5–15)
BUN: 20 mg/dL (ref 6–20)
CALCIUM: 7.8 mg/dL — AB (ref 8.9–10.3)
CO2: 24 mmol/L (ref 22–32)
CREATININE: 0.52 mg/dL (ref 0.44–1.00)
Chloride: 112 mmol/L — ABNORMAL HIGH (ref 101–111)
GFR calc non Af Amer: >60 mL/min (ref >60)
GLUCOSE: 126 mg/dL — AB (ref 65–99)
Potassium: 5.1 mmol/L (ref 3.5–5.1)
Sodium: 140 mmol/L (ref 135–145)

## 2017-11-23 LAB — GLUCOSE, CAPILLARY
GLUCOSE-CAPILLARY: 157 mg/dL — AB (ref 65–99)
Glucose-Capillary: 129 mg/dL — ABNORMAL HIGH (ref 65–99)
Glucose-Capillary: 94 mg/dL (ref 65–99)
Glucose-Capillary: 145 mg/dL — ABNORMAL HIGH (ref 65–99)

## 2017-11-23 LAB — CBC
HEMATOCRIT: 31.9 % — AB (ref 36.0–46.0)
Hemoglobin: 10.5 g/dL — ABNORMAL LOW (ref 12.0–15.0)
MCH: 30.7 pg (ref 26.0–34.0)
MCHC: 32.9 g/dL (ref 30.0–36.0)
MCV: 93.3 fL (ref 78.0–100.0)
PLATELETS: 79 10*3/uL — AB (ref 150–400)
RBC: 3.42 MIL/uL — ABNORMAL LOW (ref 3.87–5.11)
RDW: 15 % (ref 11.5–15.5)
WBC: 12.8 10*3/uL — AB (ref 4.0–10.5)

## 2017-11-23 MED ORDER — DEXTROSE-NACL 5-0.45 % IV SOLN
INTRAVENOUS | Status: DC
  Administered 2017-11-23: 14:00:00 via INTRAVENOUS
  Administered 2017-11-24: 75 mL/h via INTRAVENOUS

## 2017-11-23 MED ORDER — ENSURE ENLIVE PO LIQD
237.0000 mL | Freq: Three times a day (TID) | ORAL | Status: DC
  Administered 2017-11-24 (×2): 237 mL via ORAL

## 2017-11-23 MED ORDER — QUETIAPINE FUMARATE 25 MG PO TABS
12.5000 mg | ORAL_TABLET | Freq: Every evening | ORAL | Status: DC
  Administered 2017-11-23 – 2017-11-24 (×2): 12.5 mg via ORAL
  Filled 2017-11-23 (×2): qty 1

## 2017-11-23 NOTE — Progress Notes (Signed)
Initial Nutrition Assessment  DOCUMENTATION CODES:   Underweight, Non-severe (moderate) malnutrition in context of acute illness/injury  INTERVENTION:   Provide Ensure Enlive po TID, each supplement provides 350 kcal and 20 grams of protein  NUTRITION DIAGNOSIS:   Moderate Malnutrition related to acute illness(recurrent sepsis, now with painful swallowing) as evidenced by percent weight loss, energy intake < 75% for > 7 days, moderate fat depletion, moderate muscle depletion.  GOAL:   Patient will meet greater than or equal to 90% of their needs  MONITOR:   PO intake, Supplement acceptance, Weight trends, Labs, I & O's  REASON FOR ASSESSMENT:   Malnutrition Screening Tool    ASSESSMENT:   83 y.o. female with history of rheumatoid arthritis on immunosuppressant, adrenal insufficiency, hypertension, aortic stenosis status post TAVR admitted 11/20/17 from skilled nursing facility with Generalized Weakness/Fatigue and Orthostatic Hypotension.   Pt followed by nutrition staff during previous admissions, 10/20/17 and 1/11. Pt continues with poor appetite. Now with painful swallowing from taking a medication that opened up in her throat. Pt was evaluated by SLP, recommended a regular consistency diet with thin liquids.  PO intakes on 2/2: 50% of lunch (250 kcal, 10g protein) and dinner(190 kcal, 8g protein). This morning pt consumed 100% of breakfast (390 kcal, 12g protein). Pt eating but not meeting estimated needs. Will order Ensure supplements.  Per chart review, pt has lost 14 lb since 12/30 (12% wt loss x 1 month, significant for time frame).   Medications: Remeron tablet daily, Miralax packet daily, D5 -.45% NaCl infusion at 75 ml/hr -provides 306 kcal Labs reviewed: CBGs: 129-157   NUTRITION - FOCUSED PHYSICAL EXAM:    Most Recent Value  Orbital Region  Mild depletion  Upper Arm Region  Moderate depletion  Thoracic and Lumbar Region  Unable to assess  Buccal Region   Moderate depletion  Temple Region  Moderate depletion  Clavicle Bone Region  Moderate depletion  Clavicle and Acromion Bone Region  Moderate depletion  Scapular Bone Region  Unable to assess  Dorsal Hand  Moderate depletion  Patellar Region  Moderate depletion  Anterior Thigh Region  Moderate depletion  Posterior Calf Region  Moderate depletion  Edema (RD Assessment)  None       Diet Order:  Diet heart healthy/carb modified Room service appropriate? Yes; Fluid consistency: Thin  EDUCATION NEEDS:   No education needs have been identified at this time  Skin:  Skin Assessment: Reviewed RN Assessment  Last BM:  2/1  Height:   Ht Readings from Last 1 Encounters:  11/21/17 5\' 3"  (1.6 m)    Weight:   Wt Readings from Last 1 Encounters:  11/23/17 106 lb 0.7 oz (48.1 kg)    Ideal Body Weight:  52.3 kg  BMI:  Body mass index is 18.78 kg/m.  Estimated Nutritional Needs:   Kcal:  1300-1500  Protein:  60-70g  Fluid:  1.5L/day  01/21/18, MS, RD, LDN Tilda Franco Inpatient Clinical Dietitian Pager: [phone] After Hours Pager: [phone]

## 2017-11-23 NOTE — Progress Notes (Signed)
Patient Demographics:    [name], is a 83 y.o. female, DOB - Feb 05, 1935, TOI:[id number]  Admit date - 11/20/2017   Admitting Physician Eduard Clos, MD  Outpatient Primary MD for the patient is Jarome Matin, MD  LOS - 1   No chief complaint on file.       Subjective:    [name] today has no fevers, no emesis,  No chest pain, no diarrhea, appetite is poor, patient slept well with seroquel overnight  Assessment  & Plan :    Principal Problem:   SIRS (systemic inflammatory response syndrome) (HCC) Active Problems:   Rheumatoid arthritis involving multiple joints (HCC)   DM2 (diabetes mellitus, type 2) (HCC)   Chronic adrenal insufficiency (HCC)   Pancreatic insufficiency (HCC)   Memory deficit   Pulmonary fibrosis (HCC)   COPD (chronic obstructive pulmonary disease) (HCC)   S/P TAVR (transcatheter aortic valve replacement)  Brief Summary:-  83 y.o. Female with History of Rheumatoid Arthritis on immunosuppressant, Adrenal insufficiency, Hypertension, aortic stenosis status post TAVR admitted 11/20/17 from skilled nursing facility with Generalized Weakness/Fatigue and Orthostatic Hypotension.  Patient states she has lost her voice last few days after Nortriptyline capsule opened up and "burnt" her Throat .  Recently treated for ESBL E. coli   Plan:- 1)SIRS- No fevers, doubt frank sepsis, lactic acid was elevated most likely due to dehydration, pro- calcitonin was not elevated, patient has leukocytosis most likely due to steroids, blood and urine cultures from 11/20/2017 remain negative so far ,  no further antibiotics for now (initially received vancomycin, Levaquin and Meropenem)  2)H/o HTN-continue bisoprolol 2.5 mg daily,  decreased nortriptyline to 25 mg nightly from 75 mg nightly to reduce orthostatic concerns  3)DM-resolved hypoglycemia secondary to poor oral intake,  anticipate hyperglycemia and due to stress dose steroids  4)COPD-no acute exacerbation at this time, patient currently on stress dose steroids, continue bronchodilators, Chest x-ray on admission without acute findings  5)RA-hold immunosuppressants, patient currently on stress dose steroids  6)History of Adrenal insufficiency- completed Solu-Cortef  [stress dose steroids], okay to use prednisone taper, hypokalemia resolved  7)Dehydration-on admission patient's BUN was up to 51 with a creatinine of 1.0, please note that 2 weeks ago BUN and creatinine were 17 and 0.9, I suspect dehydration and prerenal azotemia explains elevated lactic acid noted #1 above, BUN/creatinine is down to 20 at 0.5 respectively  8)Neuropsych-she did much better on Seroquel, no further agitation   Code Status : Full code  Disposition Plan  : SNF if blood and urine cultures remain negative, patient's daughter Ms. Merrie Roof updated  DVT Prophylaxis  :  STOP Lovenox , use SCD, low platelets noted  Lab Results  Component Value Date   PLT 79 (L) 11/23/2017    Inpatient Medications  Scheduled Meds: . bisoprolol  2.5 mg Oral Daily  . buPROPion  100 mg Oral q morning - 10a  . feeding supplement (ENSURE ENLIVE)  237 mL Oral TID BM  . hydrocortisone sod succinate (SOLU-CORTEF) inj  50 mg Intravenous Q12H  . insulin aspart  0-9 Units Subcutaneous TID WC  . latanoprost  1 drop Both Eyes QHS  . mirtazapine  30 mg Oral QHS  . nortriptyline  25 mg Oral QHS  .  polyethylene glycol  17 g Oral Daily  . QUEtiapine  25 mg Oral QPM   Continuous Infusions: . dextrose 5 % and 0.45% NaCl 75 mL/hr at 11/23/17 1347   PRN Meds:.acetaminophen **OR** acetaminophen, HYDROcodone-acetaminophen, ondansetron **OR** ondansetron (ZOFRAN) IV, senna-docusate    Anti-infectives (From admission, onward)   Start     Dose/Rate Route Frequency Ordered Stop   11/22/17 2200  vancomycin (VANCOCIN) IVPB 750 mg/150 ml premix  Status:   Discontinued     750 mg 150 mL/hr over 60 Minutes Intravenous Every 48 hours 11/20/17 2134 11/21/17 0840   11/21/17 1000  meropenem (MERREM) 1 g in sodium chloride 0.9 % 100 mL IVPB  Status:  Discontinued     1 g 200 mL/hr over 30 Minutes Intravenous Every 12 hours 11/20/17 2133 11/21/17 0840   11/20/17 2145  meropenem (MERREM) 1 g in sodium chloride 0.9 % 100 mL IVPB     1 g 200 mL/hr over 30 Minutes Intravenous  Once 11/20/17 2131 11/20/17 2331   11/20/17 1945  aztreonam (AZACTAM) 2 GM IVPB  Status:  Discontinued     2 g 100 mL/hr over 30 Minutes Intravenous  Once 11/20/17 1931 11/20/17 2131   11/20/17 1930  levofloxacin (LEVAQUIN) IVPB 750 mg     750 mg 100 mL/hr over 90 Minutes Intravenous  Once 11/20/17 1927 11/20/17 2149   11/20/17 1930  aztreonam (AZACTAM) 2 g in dextrose 5 % 50 mL IVPB  Status:  Discontinued     2 g 100 mL/hr over 30 Minutes Intravenous  Once 11/20/17 1927 11/20/17 1931   11/20/17 1930  vancomycin (VANCOCIN) IVPB 1000 mg/200 mL premix     1,000 mg 200 mL/hr over 60 Minutes Intravenous  Once 11/20/17 1927 11/21/17 0043        Objective:   Vitals:   11/22/17 1242 11/22/17 2034 11/23/17 0627 11/23/17 1357  BP: 140/83 (!) 103/48 (!) 107/44 113/65  Pulse: (!) 108 80 77 90  Resp: 16 18 18 18   Temp: 97.9 F (36.6 C) 97.6 F (36.4 C) 98.3 F (36.8 C) (!) 97.5 F (36.4 C)  TempSrc: Oral Axillary Axillary Axillary  SpO2: 100% 100% 99% 100%  Weight:   48.1 kg (106 lb 0.7 oz)   Height:        Wt Readings from Last 3 Encounters:  11/23/17 48.1 kg (106 lb 0.7 oz)  10/31/17 49.3 kg (108 lb 11 oz)  10/19/17 54.6 kg (120 lb 5.9 oz)     Intake/Output Summary (Last 24 hours) at 11/23/2017 1537 Last data filed at 11/23/2017 0900 Gross per 24 hour  Intake 1511.25 ml  Output 400 ml  Net 1111.25 ml    Physical Exam  Gen:- Awake Alert,  In no apparent distress ,   HEENT:- [place].AT, No sclera icterus Neck-Supple Neck,No JVD,.  Lungs-diminished in bases without  wheezing  CV- S1, S2 normal Abd-  +ve B.Sounds, Abd Soft, No tenderness,    Extremity/Skin:- No  edema,    Neuropsych-generalized weakness without new focal deficits,     Data Review:   Micro Results Recent Results (from the past 240 hour(s))  Blood culture (routine x 2)     Status: None (Preliminary result)   Collection Time: 11/20/17  6:38 PM  Result Value Ref Range Status   Specimen Description   Final    BLOOD LEFT FOREARM Performed at [facility], [address] [place], [place] [address]    Special Requests   Final  IN PEDIATRIC BOTTLE Blood Culture adequate volume Performed at [facility], [address] [place], [place] [address]    Culture   Final    NO GROWTH 3 DAYS Performed at [facility], [address], [place], [place] [address]    Report Status PENDING  Incomplete  Urine culture     Status: None   Collection Time: 11/20/17  8:38 PM  Result Value Ref Range Status   Specimen Description   Final    URINE, RANDOM Performed at [facility], [address] [place], [place] [address]    Special Requests   Final    NONE Performed at [facility], [address] [place], [place] [address]    Culture   Final    NO GROWTH Performed at [facility], [address] [place], [place] [address]    Report Status 11/22/2017 FINAL  Final    Radiology Reports Dg Chest 2 View  Result Date: 10/30/2017 CLINICAL DATA:  Status post possible fall with injury to the ribs. EXAM: CHEST  2 VIEW COMPARISON:  10/19/2017 FINDINGS: Prior sending aortic stent graft repair. Cardiomediastinal silhouette is normal. Mediastinal contours appear intact. Calcific atherosclerotic disease of the aorta. Stable elevation of the left hemidiaphragm. There is no evidence of focal airspace consolidation, pleural effusion or pneumothorax. Osseous structures are without acute abnormality. Thoracic kyphosis with stable  anterior compression deformity of T6 vertebral body. Minimally displaced left fourth posterior rib fracture. Soft tissues are grossly normal. IMPRESSION: Minimally displaced left 4th posterior rib fracture. No evidence of pneumothorax. Electronically Signed   By: Ted Mcalpine M.D.   On: 10/30/2017 20:01   Ct Head Wo Contrast  Result Date: 10/30/2017 CLINICAL DATA:  Status post fall. EXAM: CT HEAD WITHOUT CONTRAST TECHNIQUE: Contiguous axial images were obtained from the base of the skull through the vertex without intravenous contrast. COMPARISON:  10/14/2015 FINDINGS: Brain: No evidence of acute infarction, hemorrhage, hydrocephalus, extra-axial collection or mass lesion/mass effect. Moderate brain parenchymal volume loss and deep white matter microangiopathy. Chronic lacunar infarct of the right thalamus. Vascular: Atherosclerotic calcifications at the intra cavernous carotid arteries. Skull: Normal. Negative for fracture or focal lesion. Sinuses/Orbits: No acute finding. Other: None. IMPRESSION: No evidence of acute traumatic injury to head. Moderate brain parenchymal atrophy and chronic microvascular disease. Chronic right thalamic lacunar infarct. Electronically Signed   By: Ted Mcalpine M.D.   On: 10/30/2017 20:41   Ct Chest Wo Contrast  Result Date: 10/30/2017 CLINICAL DATA:  Fall with rib pain. EXAM: CT CHEST WITHOUT CONTRAST TECHNIQUE: Multidetector CT imaging of the chest was performed following the standard protocol without IV contrast. COMPARISON:  05/27/2015 FINDINGS: Cardiovascular: Heart size upper normal to mildly enlarged. No pericardial effusion. Patient is status post aortic valve replacement. Coronary artery calcification is evident. Atherosclerotic calcification is noted in the wall of the thoracic aorta. Prominence of the main pulmonary artery raises the question of pulmonary arterial hypertension. Mediastinum/Nodes: No mediastinal lymphadenopathy. No evidence for gross  hilar lymphadenopathy although assessment is limited by the lack of intravenous contrast on today's study. Lungs/Pleura: Evaluation of lung parenchyma is obscured scratchy at fine detail of lung parenchyma is obscured by patient breathing motion during image acquisition. Probable emphysema with some interlobular septal thickening in the lung apices. Patchy atelectasis or pneumonia noted posterior left lower lobe. No pneumothorax. No pleural effusion. Upper Abdomen: Unremarkable. Musculoskeletal: Multiple left-sided rib fractures of varying age are identified. Anterior old fractures  of lower left ribs evident. Fractures of the posterior tenth, eleventh, and twelfth ribs on the left are nonacute as there is evidence of bony callus, but these are not yet healed. Old anterior right rib fractures identified in the lower ribs but incompletely visualized. IMPRESSION: 1. Posterior fractures of the left tenth, eleventh, and twelfth ribs are at least subacute as bony callus is evident but the fracture lines are still visible consistent with incomplete healing. 2. Old healed fractures are identified in the anterior lower ribs bilaterally. 3. No pneumothorax or pleural effusion. 4. Mild patchy airspace opacity posterior left lower lobe compatible with atelectasis or pneumonia. 5.  Aortic Atherosclerois (ICD10-170.0) 6. Question pulmonary arterial hypertension. Electronically Signed   By: Kennith Center M.D.   On: 10/30/2017 20:44   Dg Chest Port 1 View  Result Date: 11/20/2017 CLINICAL DATA:  Increased weakness over the last few days. EXAM: PORTABLE CHEST 1 VIEW COMPARISON:  Chest radiograph 10/30/2017. FINDINGS: Monitoring leads overlie the patient. Stable cardiac and mediastinal contours. Stent graft Re demonstrated. No consolidative pulmonary opacities. No pleural effusion or pneumothorax. Healing bilateral rib fractures. IMPRESSION: No acute cardiopulmonary process. Electronically Signed   By: Annia Belt M.D.   On:  11/20/2017 18:30     CBC Recent Labs  Lab 11/20/17 1734 11/21/17 0410 11/22/17 0932 11/23/17 0506  WBC 16.5* 16.5* 11.5* 12.8*  HGB 12.9 11.5* 10.8* 10.5*  HCT 38.9 34.3* 33.3* 31.9*  PLT 127* 110* 80* 79*  MCV 92.4 92.0 93.3 93.3  MCH 30.6 30.8 30.3 30.7  MCHC 33.2 33.5 32.4 32.9  RDW 14.7 14.9 14.7 15.0  LYMPHSABS 0.8  --   --   --   MONOABS 0.4  --   --   --   EOSABS 0.0  --   --   --   BASOSABS 0.0  --   --   --     Chemistries  Recent Labs  Lab 11/20/17 1734 11/20/17 2119 11/21/17 0410 11/22/17 0932 11/23/17 0506  NA 135  --  139 138 140  K 3.7  --  3.9 2.6* 5.1  CL 94*  --  104 104 112*  CO2 27  --  26 26 24   GLUCOSE 191*  --  79 131* 126*  BUN 51*  --  39* 18 20  CREATININE 1.10* <0.30* 0.80 0.46 0.52  CALCIUM 8.1*  --  7.3* 7.5* 7.8*  AST 25  --   --   --   --   ALT 16  --   --   --   --   ALKPHOS 89  --   --   --   --   BILITOT 1.0  --   --   --   --    ------------------------------------------------------------------------------------------------------------------ No results for input(s): CHOL, HDL, LDLCALC, TRIG, CHOLHDL, LDLDIRECT in the last 72 hours.  Lab Results  Component Value Date   HGBA1C 5.0 11/01/2017   ------------------------------------------------------------------------------------------------------------------ No results for input(s): TSH, T4TOTAL, T3FREE, THYROIDAB in the last 72 hours.  Invalid input(s): FREET3 ------------------------------------------------------------------------------------------------------------------ No results for input(s): VITAMINB12, FOLATE, FERRITIN, TIBC, IRON, RETICCTPCT in the last 72 hours.  Coagulation profile No results for input(s): INR, PROTIME in the last 168 hours.  No results for input(s): DDIMER in the last 72 hours.  Cardiac Enzymes Recent Labs  Lab 11/20/17 2119 11/21/17 0249 11/21/17 1048  TROPONINI <0.03 0.07* 0.06*    ------------------------------------------------------------------------------------------------------------------    Component Value Date/Time   BNP 65.2 03/23/2014 1429  Shon Hale M.D on 11/23/2017 at 3:37 PM  Between 7am to 7pm - Pager - [phone]  After 7pm go to [email or web address] - password [email or web address]  [facility] -  Office  [phone]   Voice Recognition [name] dictation system was used to create this note, attempts have been made to correct errors. Please contact the author with questions and/or clarifications.

## 2017-11-24 LAB — GLUCOSE, CAPILLARY
GLUCOSE-CAPILLARY: 131 mg/dL — AB (ref 65–99)
GLUCOSE-CAPILLARY: 115 mg/dL — AB (ref 65–99)
Glucose-Capillary: 98 mg/dL (ref 65–99)

## 2017-11-24 MED ORDER — NORTRIPTYLINE HCL 25 MG PO CAPS: 25.0000 mg | ORAL_CAPSULE | Freq: Every day | ORAL | 0 refills | Status: AC

## 2017-11-24 MED ORDER — ALPRAZOLAM 1 MG PO TABS: 0.5000 mg | ORAL_TABLET | Freq: Every evening | ORAL | 0 refills | Status: DC | PRN

## 2017-11-24 MED ORDER — QUETIAPINE FUMARATE 25 MG PO TABS: 12.5000 mg | ORAL_TABLET | Freq: Every evening | ORAL | 0 refills | Status: DC

## 2017-11-24 MED ORDER — HYDROCODONE-ACETAMINOPHEN 5-325 MG PO TABS: 1.0000 | ORAL_TABLET | Freq: Four times a day (QID) | ORAL | 0 refills | Status: DC | PRN

## 2017-11-24 MED ORDER — FUROSEMIDE 20 MG PO TABS: 20.0000 mg | ORAL_TABLET | Freq: Every day | ORAL | 11 refills | Status: DC | PRN

## 2017-11-24 MED ORDER — ONDANSETRON HCL 4 MG PO TABS: 4.0000 mg | ORAL_TABLET | Freq: Four times a day (QID) | ORAL | 0 refills | Status: DC | PRN

## 2017-11-24 MED ORDER — PREDNISONE 10 MG PO TABS: ORAL_TABLET | ORAL | 0 refills | Status: DC

## 2017-11-24 NOTE — Progress Notes (Signed)
[facility] here to transport patient. Vital signs stable. Pt belongings sent with patient.

## 2017-11-24 NOTE — Progress Notes (Signed)
Belongings given to Pt including her glasses she has them on.

## 2017-11-24 NOTE — Progress Notes (Signed)
Awaiting [facility] for transport.  

## 2017-11-24 NOTE — Care Management Note (Signed)
Case Management Note  Patient Details  Name: [name] MRN: [id number] Date of Birth: Apr 21, 1935  Subjective/Objective:    Pt admitted with SIR                 Action/Plan: From home plan to discharge to a SNF   Expected Discharge Date:  (unknown)               Expected Discharge Plan:  Skilled Nursing Facility  In-House Referral:  Clinical Social Work  Discharge planning Services  CM Consult  Post Acute Care Choice:    Choice offered to:  Patient  DME Arranged:    DME Agency:     HH Arranged:    HH Agency:     Status of Service:  In process, will continue to follow  If discussed at Long Length of Stay Meetings, dates discussed:    Additional CommentsGeni Bers, RN 11/24/2017, 12:21 PM

## 2017-11-24 NOTE — Progress Notes (Signed)
  Speech Language Pathology Treatment: Dysphagia  Patient Details Name: [name] MRN: [id number] DOB: 1935-02-05 Today's Date: 11/24/2017 Time: 1000-1033 SLP Time Calculation (min) (ACUTE ONLY): 33 min  Assessment / Plan / Recommendation Clinical Impression  Pt today seen to assess tolerance of po diet given recent odynophagia complaint from melted capsule.  Pt reports resolution of odynophagia and voice improving but not at baseline yet.  Pt with posterior pharyngeal viscous secretions present.  Intake listed as 90-100% with pt being afebrile.  Pt did not pass 3 ounce water test due to requiring rest break due to dyspnea and cough post-swallow.  She denies coughing with breakfast.  SLP reviewed prior UGI study from 2011 with pt where she was noted to have findings consistent with esophageal dysmotility and barium tablet lodged mid and lower esophagus. Provided pt with written and verbal precautions for reflux/eating with respiratory difficulties.  SLP to sign off as all education completed and pt tolerating po well.  Thanks!   HPI HPI: 82 y.o. female with history of rheumatoid arthritis on immunosuppressant, adrenal insufficiency, hypertension, aortic stenosis status post TAVR was referred to the ER from skilled nursing facility after patient was found to be weak fatigue is ongoing for last few days with patient being orthostatic.  Patient states he has lost her voice last few days and states he has been feeling weak.  Has been coughing denies any chest pain.  Denies any nausea vomiting or diarrhea.      SLP Plan  All goals met       Recommendations  Diet recommendations: Regular;Thin liquid Liquids provided via: Cup;Straw Medication Administration: Whole meds with puree(for pain management and ease of clearance, pt has h/o likely esophageal dysmotility per UGI 2011, plenty of liquid after) Supervision: Patient able to self feed Compensations: Slow rate;Small sips/bites(start meals  with liquids, drink liquids t/o meal, plenty of liquids after medication) Postural Changes and/or Swallow Maneuvers: Seated upright 90 degrees;Upright 30-60 min after meal                Oral Care Recommendations: Oral care BID Follow up Recommendations: None SLP Visit Diagnosis: Dysphagia, unspecified (R13.10) Plan: All goals met       GO                Macario Golds 11/24/2017, 10:42 AM   Luanna Salk, [facility] SLP [phone]

## 2017-11-24 NOTE — Clinical Social Work Placement (Addendum)
Pt discharged with plan to return to [name] room 405-B, report [phone]  Arranged [facility] transportation  Facility received DC information via the HUB. Obtained [facility] Medicare insurance authorization for pt to continue rehab.  Spoke with pt's daughter Olegario Messier [phone]  - aware and agreeable to plan.  Pt's son Larry's voicemail box full [phone]. Pt's granddaughter Tresa Endo- [phone] left message that pt returning to [name] requesting call back with any questions.     CLINICAL SOCIAL WORK PLACEMENT  NOTE  Date:  11/24/2017  Patient Details  Name: [name] MRN: [id number] Date of Birth: 08/28/1935  Clinical Social Work is seeking post-discharge placement for this patient at the   level of care (*CSW will initial, date and re-position this form in  chart as items are completed):  Yes   Patient/family provided with [facility] Clinical Social Work Department's list of facilities offering this level of care within the geographic area requested by the patient (or if unable, by the patient's family).  Yes   Patient/family informed of their freedom to choose among providers that offer the needed level of care, that participate in Medicare, Medicaid or managed care program needed by the patient, have an available bed and are willing to accept the patient.  Yes   Patient/family informed of [facility]'s ownership interest in [facility] and [facility], as well as of the fact that they are under no obligation to receive care at these facilities.  PASRR submitted to EDS on       PASRR number received on       Existing PASRR number confirmed on 11/24/17     FL2 transmitted to all facilities in geographic area requested by pt/family on 11/24/17     FL2 transmitted to all facilities within larger geographic area on       Patient informed that his/her managed care company has contracts with or will negotiate with certain facilities, including the  following:  [name]     Yes   Patient/family informed of bed offers received.  Patient chooses bed at [name]     Physician recommends and patient chooses bed at [name]    Patient to be transferred to [name] on 11/24/17.  Patient to be transferred to facility by [facility]     Patient family notified on 11/24/17 of transfer.  Name of family member notified:  Daughter [facility]     PHYSICIAN       Additional Comment:    _______________________________________________ Nelwyn Salisbury, LCSW 11/24/2017, 3:55 PM  [phone]

## 2017-11-24 NOTE — Progress Notes (Signed)
D/cing to SNF [name] .Report called spoke to [facility] LPN

## 2017-11-24 NOTE — [place] FL2 (Signed)
[place] MEDICAID FL2 LEVEL OF CARE SCREENING TOOL     IDENTIFICATION  Patient Name: [name] Birthdate: 1935-03-02 Sex: female Admission Date (Current Location): 11/20/2017  [facility] and [place] Number:  Producer, television/film/video and Address:  [facility],  [address], [place] [address]      Provider Number: [id number]  Attending Physician Name and Address:  Shon Hale, MD  Relative Name and Phone Number:       Current Level of Care: Hospital Recommended Level of Care: Skilled Nursing Facility Prior Approval Number:    Date Approved/Denied:   PASRR Number: [id number]  Discharge Plan: SNF    Current Diagnoses: Patient Active Problem List   Diagnosis Date Noted  . SIRS (systemic inflammatory response syndrome) (HCC) 11/20/2017  . HCAP (healthcare-associated pneumonia) 10/30/2017  . Fracture of three ribs, left, closed, initial encounter 10/30/2017  . Sepsis associated hypotension (HCC) 10/19/2017  . Elevated troponin 10/19/2017  . Dizziness 07/24/2016  . Dyspnea 12/06/2015  . Hypoxia 12/06/2015  . Hypokalemia 10/21/2015  . Hypophosphatemia 10/21/2015  . Hypomagnesemia 10/21/2015  . CAP (community acquired pneumonia) 10/20/2015  . B12 deficiency   . Malnutrition of moderate degree (HCC) 03/18/2015  . Altered mental state 03/17/2015  . Metabolic encephalopathy 09/12/2014    Class: Acute  . Sepsis secondary to UTI (HCC) 09/09/2014  . S/P TAVR (transcatheter aortic valve replacement) 03/16/2014  . Left rib fracture 11/17/2013  . Closed pelvic fracture (HCC) 11/17/2013  . Sepsis (HCC) 11/10/2013  . UTI (lower urinary tract infection) 11/09/2013  . Sepsis syndrome 09/17/2013  . COPD (chronic obstructive pulmonary disease) (HCC) 07/20/2013  . Chest pain 06/29/2013  . Hearing loss 03/25/2013  . Memory deficit 03/25/2013  . Depression 03/25/2013  . Syncope 03/25/2013  . Pulmonary fibrosis (HCC)   . Aortic stenosis, severe 02/26/2013  .  Pancreatic insufficiency (HCC) 11/09/2012    Class: Chronic  . Low back pain 01/16/2012    Class: Acute  . Gait instability 01/16/2012    Class: Acute  . Sinusitis, acute, sphenoidal 11/17/2011    Class: Acute  . Pneumonia 11/12/2011    Class: Acute  . Chronic adrenal insufficiency (HCC) 11/12/2011    Class: Chronic  . E. coli UTI (urinary tract infection) 10/01/2011  . Fever 10/01/2011  . Cough 10/01/2011  . Failure to thrive in adult 10/01/2011  . Weakness generalized 10/01/2011  . Rheumatoid arthritis involving multiple joints (HCC) 10/01/2011  . DM2 (diabetes mellitus, type 2) (HCC) 10/01/2011    Orientation RESPIRATION BLADDER Height & Weight     Self, Situation, Place, Time  Normal Incontinent Weight: 108 lb 0.4 oz (49 kg) Height:  5\' 3"  (160 cm)  BEHAVIORAL SYMPTOMS/MOOD NEUROLOGICAL BOWEL NUTRITION STATUS      Incontinent Diet(heart healthy)  AMBULATORY STATUS COMMUNICATION OF NEEDS Skin   Extensive Assist Verbally Normal                       Personal Care Assistance Level of Assistance  Bathing, Feeding, Dressing Bathing Assistance: Limited assistance Feeding assistance: Independent Dressing Assistance: Limited assistance     Functional Limitations Info  Sight, Hearing, Speech Sight Info: Adequate Hearing Info: Impaired(hard of hearing) Speech Info: Adequate    SPECIAL CARE FACTORS FREQUENCY  OT (By licensed OT), PT (By licensed PT)     PT Frequency: 5x OT Frequency: 5x            Contractures Contractures Info: Not present    Additional  Factors Info  Code Status, Allergies, Isolation Precautions Code Status Info: full code Allergies Info:  Adhesive Tape, Penicillins           Current Medications (11/24/2017):  This is the current hospital active medication list Current Facility-Administered Medications  Medication Dose Route Frequency Provider Last Rate Last Dose  . acetaminophen (TYLENOL) tablet 650 mg  650 mg Oral Q6H PRN  Eduard Clos, MD       Or  . acetaminophen (TYLENOL) suppository 650 mg  650 mg Rectal Q6H PRN Eduard Clos, MD      . bisoprolol (ZEBETA) tablet 2.5 mg  2.5 mg Oral Daily Eduard Clos, MD   2.5 mg at 11/24/17 0835  . buPROPion [facility] tablet 100 mg  100 mg Oral q morning - 10a Eduard Clos, MD   100 mg at 11/24/17 0834  . dextrose 5 %-0.45 % sodium chloride infusion   Intravenous Continuous Emokpae, Courage, MD 75 mL/hr at 11/24/17 0239 75 mL/hr at 11/24/17 0239  . feeding supplement (ENSURE ENLIVE) (ENSURE ENLIVE) liquid 237 mL  237 mL Oral TID BM Emokpae, Courage, MD   237 mL at 11/24/17 0835  . HYDROcodone-acetaminophen (NORCO/VICODIN) 5-325 MG per tablet 1 tablet  1 tablet Oral Q6H PRN Eduard Clos, MD   1 tablet at 11/24/17 0239  . hydrocortisone sodium succinate (SOLU-CORTEF) 100 MG injection 50 mg  50 mg Intravenous Q12H Shon Hale, MD   50 mg at 11/24/17 0239  . insulin aspart (novoLOG) injection 0-9 Units  0-9 Units Subcutaneous TID WC Eduard Clos, MD   1 Units at 11/24/17 [phone]  . latanoprost (XALATAN) 0.005 % ophthalmic solution 1 drop  1 drop Both Eyes QHS Eduard Clos, MD   1 drop at 11/23/17 2146  . mirtazapine (REMERON) tablet 30 mg  30 mg Oral QHS Eduard Clos, MD   30 mg at 11/23/17 2146  . nortriptyline (PAMELOR) capsule 25 mg  25 mg Oral QHS Shon Hale, MD   25 mg at 11/23/17 2146  . ondansetron (ZOFRAN) tablet 4 mg  4 mg Oral Q6H PRN Eduard Clos, MD       Or  . ondansetron [facility]) injection 4 mg  4 mg Intravenous Q6H PRN Eduard Clos, MD      . polyethylene glycol (MIRALAX / GLYCOLAX) packet 17 g  17 g Oral Daily Eduard Clos, MD   17 g at 11/24/17 0835  . QUEtiapine (SEROQUEL) tablet 12.5 mg  12.5 mg Oral QPM Emokpae, Courage, MD   12.5 mg at 11/23/17 1709  . senna-docusate (Senokot-S) tablet 1 tablet  1 tablet Oral QHS PRN Eduard Clos, MD   1 tablet at 11/21/17  0112     Discharge Medications: Please see discharge summary for a list of discharge medications.  Relevant Imaging Results:  Relevant Lab Results:   Additional Information SS#: [id number]  Nelwyn Salisbury, LCSW

## 2017-11-24 NOTE — Progress Notes (Signed)
[name], is a 83 y.o. female  DOB 01-10-1935  MRN [id number].  Admission date:  11/20/2017  Admitting Physician  Eduard Clos, MD  Discharge Date:  11/24/2017   Primary MD  Jarome Matin, MD  Recommendations for primary care physician for things to follow:    Admission Diagnosis  Weakness [R53.1] Recurrent pneumonia [J18.9] SIRS (systemic inflammatory response syndrome) (HCC) [R65.10]   Discharge Diagnosis  Weakness [R53.1] Recurrent pneumonia [J18.9] SIRS (systemic inflammatory response syndrome) (HCC) [R65.10]    Principal Problem:   SIRS (systemic inflammatory response syndrome) (HCC) Active Problems:   Rheumatoid arthritis involving multiple joints (HCC)   DM2 (diabetes mellitus, type 2) (HCC)   Chronic adrenal insufficiency (HCC)   Pancreatic insufficiency (HCC)   Memory deficit   Pulmonary fibrosis (HCC)   COPD (chronic obstructive pulmonary disease) (HCC)   S/P TAVR (transcatheter aortic valve replacement)      Past Medical History:  Diagnosis Date  . Adrenal insufficiency, primary, iatrogenic   . Anxiety   . Aortic stenosis, severe 02/26/2013  . Chest pain    myoview 04/13/04-inferoapical thinning with a suggestion of mild apical ischemia, EF 66%  . Depression 03/25/2013  . Gait instability   . Hearing loss 03/25/2013  . Memory deficit 03/25/2013  . Murmur, cardiac   . Osteoporosis   . Palpitations   . Pancreatic insufficiency 11/09/2012  . Physical deconditioning   . Pneumonia   . Pulmonary fibrosis (HCC) 03/25/2013  . Rheumatoid arthritis(714.0)   . SOBOE (shortness of breath on exertion)    echo 02/23/13-EF 55-60%, possible bicuspid aortic valve with mod to severed stenosis   . Syncope   . UTI (lower urinary tract infection) 10/01/2011    Past Surgical History:  Procedure Laterality Date  . ABDOMINAL HYSTERECTOMY    . CARDIAC CATHETERIZATION  11/08/96   no  evidence of CAD, nl LV function  . CHOLECYSTECTOMY    . CORONARY ANGIOGRAM  03/22/2013   Procedure: CORONARY ANGIOGRAM;  Surgeon: [name], MD;  Location: [facility] CATH LAB;  Service: Cardiovascular;;  . KNEE ARTHROSCOPY     2 on left  . RIGHT HEART CATHETERIZATION  03/22/2013   Procedure: RIGHT HEART CATH;  Surgeon: [name], MD;  Location: [facility] CATH LAB;  Service: Cardiovascular;;  . SHOULDER SURGERY Left   . THYROIDECTOMY         HPI  from the history and physical done on the day of admission:    HPI: [name] is a 83 y.o. female with history of rheumatoid arthritis on immunosuppressant, adrenal insufficiency, hypertension, aortic stenosis status post TAVR was referred to the ER from skilled nursing facility after patient was found to be weak fatigue is ongoing for last few days with patient being orthostatic.  Patient states he has lost her voice last few days and states he has been feeling weak.  Has been coughing denies any chest pain.  Denies any nausea vomiting or diarrhea.  Patient was recently admitted for ESBL UTI following  which patient again was admitted for pneumonia last month.  ED Course: In the ER patient had a lactate elevated around 3 with leukocytosis chest x-ray unremarkable UA still pending.  Given the immunosuppressive state she was empirically started on antibiotics after blood cultures and urine cultures sent.  Patient was given fluid bolus and admitted for hydration and possible developing sepsis.    Hospital Course:    Brief Summary:- 83 y.o.FemalewithHistory of Rheumatoid Arthritis on immunosuppressant, Adrenal insufficiency, Hypertension, aortic stenosis status post TAVRadmitted 11/20/17 from skilled nursing facility with Generalized Weakness/Fatigue and Orthostatic Hypotension. Patient states she has lost her voice last few days after Nortriptyline capsule opened up and "burnt" her Throat .  Recently treated for ESBL E. coli      Plan:- 1)SIRS- No fevers, doubt frank sepsis, lactic acid was elevated most likely due to dehydration, pro- calcitonin was not elevated, patient has leukocytosis most likely due to steroids, blood and urine cultures from 11/20/2017 remain negative so far ,  no further antibiotics for now (initially received vancomycin, Levaquin and Meropenem),   2)H/o HTN-continue bisoprolol 2.5 mg daily,  decreased nortriptyline to 25 mg nightly from 75 mg nightly to reduce orthostatic concerns  3)DM-stable, continue current regimen  4)COPD-no acute exacerbation at this time, patient currently on stress dose steroids, continue bronchodilators, Chest x-ray on admission without acute findings  5)RA-hold immunosuppressants, patient currently on stress dose steroids  6)History of Adrenal insufficiency- completed Solu-Cortef  [stress dose steroids], okay to use prednisone taper, hypokalemia resolved  7)Dehydration-on admission patient's BUN was up to 51 with a creatinine of 1.0, please note that 2 weeks ago BUN and creatinine were 17 and 0.9, I suspect dehydration and prerenal azotemia explains elevated lactic acid noted #1 above, BUN/creatinine is down to 20 at 0.5 respectively  8)Neuropsych-she did much better on Seroquel, no further agitation   Code Status : Full code  Disposition Plan  : SNF  , patient's daughter Olegario Messier was previously updated    Discharge Condition: stable  Follow UP  Contact information for after-discharge care    Destination    [name] .   Service:  Skilled Nursing Contact information: [address] [place] [address] [phone]               Diet and Activity recommendation:  As advised  Discharge Instructions    Discharge Instructions    Call MD for:  difficulty breathing, headache or visual disturbances   Complete by:  As directed    Call MD for:  persistant dizziness or light-headedness   Complete by:  As  directed    Call MD for:  persistant nausea and vomiting   Complete by:  As directed    Call MD for:  temperature >100.4   Complete by:  As directed    Diet - low sodium heart healthy   Complete by:  As directed    Discharge instructions   Complete by:  As directed    1)Taper off Prednisone slowly 2)Drink Fluids 3)Avoid Excessive diuretic use   Increase activity slowly   Complete by:  As directed         Discharge Medications     Allergies as of 11/24/2017      Reactions   Adhesive [tape] Other (See Comments)   Skin peels up.    Penicillins Hives   Has patient had a PCN reaction causing immediate rash, facial/tongue/throat swelling, SOB or lightheadedness with hypotension: No Has patient had  a PCN reaction causing severe rash involving mucus membranes or skin necrosis: No Has patient had a PCN reaction that required hospitalization No Has patient had a PCN reaction occurring within the last 10 years: No If all of the above answers are "NO", then may proceed with Cephalosporin use.      Medication List    STOP taking these medications   doxycycline 100 MG tablet Commonly known as:  VIBRA-TABS     TAKE these medications   ALPRAZolam 1 MG tablet Commonly known as:  XANAX Take 0.5 tablets (0.5 mg total) by mouth at bedtime as needed for anxiety. Anxiety What changed:    when to take this  reasons to take this   bisoprolol 5 MG tablet Commonly known as:  ZEBETA Take 0.5 tablets (2.5 mg total) by mouth daily.   buPROPion 100 MG tablet Commonly known as:  WELLBUTRIN Take 100 mg by mouth every morning.   CHLORASEPTIC MAX SORE THROAT 1.5-33 % Liqd Generic drug:  Phenol-Glycerin Use as directed 2 sprays in the mouth or throat 3 (three) times daily as needed (Sore Throat).   furosemide 20 MG tablet Commonly known as:  LASIX Take 1 tablet (20 mg total) by mouth daily as needed. What changed:    medication strength  how much to take  when to take  this  reasons to take this   HYDROcodone-acetaminophen 5-325 MG tablet Commonly known as:  NORCO/VICODIN Take 1 tablet by mouth every 6 (six) hours as needed for moderate pain.   leflunomide 20 MG tablet Commonly known as:  ARAVA Take 1 tablet (20 mg total) by mouth daily.   mirtazapine 30 MG tablet Commonly known as:  REMERON Take 30 mg by mouth at bedtime.   nortriptyline 25 MG capsule Commonly known as:  PAMELOR Take 1 capsule (25 mg total) by mouth at bedtime. What changed:    medication strength  how much to take   ondansetron 4 MG tablet Commonly known as:  ZOFRAN Take 1 tablet (4 mg total) by mouth every 6 (six) hours as needed for nausea.   polyethylene glycol packet Commonly known as:  MIRALAX / GLYCOLAX Take 17 g by mouth daily.   potassium chloride SA 20 MEQ tablet Commonly known as:  K-DUR,KLOR-CON Take 20 mEq by mouth 2 (two) times daily.   predniSONE 10 MG tablet Commonly known as:  DELTASONE Take 40 mg (4 tablets) daily for 3 days, then 3 tablets daily for 3 days, then 2 tablets daily for 3 days, then 1 tablet daily for 4 days and stop What changed:    medication strength  how much to take  how to take this  when to take this  additional instructions   QUEtiapine 25 MG tablet Commonly known as:  SEROQUEL Take 0.5 tablets (12.5 mg total) by mouth every evening.   senna-docusate 8.6-50 MG tablet Commonly known as:  Senokot-S Take 1 tablet by mouth at bedtime as needed for mild constipation.   TRAVATAN Z 0.004 % Soln ophthalmic solution Generic drug:  Travoprost (BAK Free) Place 1 drop into both eyes at bedtime.   vitamin B-12 1000 MCG tablet Commonly known as:  CYANOCOBALAMIN Take 1 tablet (1,000 mcg total) by mouth daily.       Major procedures and Radiology Reports - PLEASE review detailed and final reports for all details, in brief -   Dg Chest 2 View  Result Date: 10/30/2017 CLINICAL DATA:  Status post possible fall with  injury to  the ribs. EXAM: CHEST  2 VIEW COMPARISON:  10/19/2017 FINDINGS: Prior sending aortic stent graft repair. Cardiomediastinal silhouette is normal. Mediastinal contours appear intact. Calcific atherosclerotic disease of the aorta. Stable elevation of the left hemidiaphragm. There is no evidence of focal airspace consolidation, pleural effusion or pneumothorax. Osseous structures are without acute abnormality. Thoracic kyphosis with stable anterior compression deformity of T6 vertebral body. Minimally displaced left fourth posterior rib fracture. Soft tissues are grossly normal. IMPRESSION: Minimally displaced left 4th posterior rib fracture. No evidence of pneumothorax. Electronically Signed   By: Ted Mcalpine M.D.   On: 10/30/2017 20:01   Ct Head Wo Contrast  Result Date: 10/30/2017 CLINICAL DATA:  Status post fall. EXAM: CT HEAD WITHOUT CONTRAST TECHNIQUE: Contiguous axial images were obtained from the base of the skull through the vertex without intravenous contrast. COMPARISON:  10/14/2015 FINDINGS: Brain: No evidence of acute infarction, hemorrhage, hydrocephalus, extra-axial collection or mass lesion/mass effect. Moderate brain parenchymal volume loss and deep white matter microangiopathy. Chronic lacunar infarct of the right thalamus. Vascular: Atherosclerotic calcifications at the intra cavernous carotid arteries. Skull: Normal. Negative for fracture or focal lesion. Sinuses/Orbits: No acute finding. Other: None. IMPRESSION: No evidence of acute traumatic injury to head. Moderate brain parenchymal atrophy and chronic microvascular disease. Chronic right thalamic lacunar infarct. Electronically Signed   By: Ted Mcalpine M.D.   On: 10/30/2017 20:41   Ct Chest Wo Contrast  Result Date: 10/30/2017 CLINICAL DATA:  Fall with rib pain. EXAM: CT CHEST WITHOUT CONTRAST TECHNIQUE: Multidetector CT imaging of the chest was performed following the standard protocol without IV contrast.  COMPARISON:  05/27/2015 FINDINGS: Cardiovascular: Heart size upper normal to mildly enlarged. No pericardial effusion. Patient is status post aortic valve replacement. Coronary artery calcification is evident. Atherosclerotic calcification is noted in the wall of the thoracic aorta. Prominence of the main pulmonary artery raises the question of pulmonary arterial hypertension. Mediastinum/Nodes: No mediastinal lymphadenopathy. No evidence for gross hilar lymphadenopathy although assessment is limited by the lack of intravenous contrast on today's study. Lungs/Pleura: Evaluation of lung parenchyma is obscured scratchy at fine detail of lung parenchyma is obscured by patient breathing motion during image acquisition. Probable emphysema with some interlobular septal thickening in the lung apices. Patchy atelectasis or pneumonia noted posterior left lower lobe. No pneumothorax. No pleural effusion. Upper Abdomen: Unremarkable. Musculoskeletal: Multiple left-sided rib fractures of varying age are identified. Anterior old fractures of lower left ribs evident. Fractures of the posterior tenth, eleventh, and twelfth ribs on the left are nonacute as there is evidence of bony callus, but these are not yet healed. Old anterior right rib fractures identified in the lower ribs but incompletely visualized. IMPRESSION: 1. Posterior fractures of the left tenth, eleventh, and twelfth ribs are at least subacute as bony callus is evident but the fracture lines are still visible consistent with incomplete healing. 2. Old healed fractures are identified in the anterior lower ribs bilaterally. 3. No pneumothorax or pleural effusion. 4. Mild patchy airspace opacity posterior left lower lobe compatible with atelectasis or pneumonia. 5.  Aortic Atherosclerois (ICD10-170.0) 6. Question pulmonary arterial hypertension. Electronically Signed   By: Kennith Center M.D.   On: 10/30/2017 20:44   Dg Chest Port 1 View  Result Date:  11/20/2017 CLINICAL DATA:  Increased weakness over the last few days. EXAM: PORTABLE CHEST 1 VIEW COMPARISON:  Chest radiograph 10/30/2017. FINDINGS: Monitoring leads overlie the patient. Stable cardiac and mediastinal contours. Stent graft Re demonstrated. No consolidative pulmonary opacities. No pleural  effusion or pneumothorax. Healing bilateral rib fractures. IMPRESSION: No acute cardiopulmonary process. Electronically Signed   By: Annia Belt M.D.   On: 11/20/2017 18:30    Micro Results   Recent Results (from the past 240 hour(s))  Blood culture (routine x 2)     Status: None (Preliminary result)   Collection Time: 11/20/17  6:38 PM  Result Value Ref Range Status   Specimen Description   Final    BLOOD LEFT FOREARM Performed at [facility], [address] [place], [place] [address]    Special Requests   Final    IN PEDIATRIC BOTTLE Blood Culture adequate volume Performed at [facility], [address], [place], [place] [address]    Culture   Final    NO GROWTH 3 DAYS Performed at [facility], [address] [place], [place] [address]    Report Status PENDING  Incomplete  Urine culture     Status: None   Collection Time: 11/20/17  8:38 PM  Result Value Ref Range Status   Specimen Description   Final    URINE, RANDOM Performed at [facility], [address] [place], [place] [address]    Special Requests   Final    NONE Performed at [facility], [address], [place], [place] [address]    Culture   Final    NO GROWTH Performed at [facility], [address] [place], [place] [address]    Report Status 11/22/2017 FINAL  Final       Today   Subjective    [name] today has no new complaints, eating and  drinking well, speech pathologist at bedside          Patient has been seen and examined prior to discharge   Objective   Blood pressure 135/84, pulse 85, temperature 98 F  (36.7 C), temperature source Axillary, resp. rate 18, height 5\' 3"  (1.6 m), weight 49 kg (108 lb 0.4 oz), SpO2 99 %.   Intake/Output Summary (Last 24 hours) at 11/24/2017 1312 Last data filed at 11/24/2017 1200 Gross per 24 hour  Intake 2326.25 ml  Output 852 ml  Net 1474.25 ml    Exam Gen:- Awake Alert,  In no apparent distress ,   HEENT:- [place].AT, No sclera icterus Neck-Supple Neck,No JVD,.  Lungs-diminished in bases without wheezing  CV- S1, S2 normal Abd-  +ve B.Sounds, Abd Soft, No tenderness,    Extremity/Skin:- No  edema,    Neuropsych-generalized weakness without new focal deficits    Data Review   CBC w Diff:  Lab Results  Component Value Date   WBC 12.8 (H) 11/23/2017   HGB 10.5 (L) 11/23/2017   HCT 31.9 (L) 11/23/2017   PLT 79 (L) 11/23/2017   LYMPHOPCT 5 11/20/2017   MONOPCT 3 11/20/2017   EOSPCT 0 11/20/2017   BASOPCT 0 11/20/2017    CMP:  Lab Results  Component Value Date   NA 140 11/23/2017   K 5.1 11/23/2017   CL 112 (H) 11/23/2017   CO2 24 11/23/2017   BUN 20 11/23/2017   CREATININE 0.52 11/23/2017   CREATININE 0.64 03/23/2014   PROT 5.6 (L) 11/20/2017   ALBUMIN 3.1 (L) 11/20/2017   BILITOT 1.0 11/20/2017   ALKPHOS 89 11/20/2017   AST 25 11/20/2017   ALT 16 11/20/2017  .   Total Discharge time is about 33 minutes  11/22/2017 M.D on 11/24/2017 at 1:12  PM  [facility]   Office  [phone]  Voice Recognition [name] dictation system was used to create this note, attempts have been made to correct errors. Please contact the author with questions and/or clarifications.

## 2017-11-24 NOTE — Discharge Summary (Signed)
[name], is a 83 y.o. female  DOB Mar 18, 1935  MRN [id number].  Admission date:  11/20/2017  Admitting Physician  Eduard Clos, MD  Discharge Date:  11/24/2017   Primary MD  Jarome Matin, MD  Recommendations for primary care physician for things to follow:   Admission Diagnosis  Weakness [R53.1] Recurrent pneumonia [J18.9] SIRS (systemic inflammatory response syndrome) (HCC) [R65.10]   Discharge Diagnosis  Weakness [R53.1] Recurrent pneumonia [J18.9] SIRS (systemic inflammatory response syndrome) (HCC) [R65.10]   Principal Problem:   SIRS (systemic inflammatory response syndrome) (HCC) Active Problems:   Rheumatoid arthritis involving multiple joints (HCC)   DM2 (diabetes mellitus, type 2) (HCC)   Chronic adrenal insufficiency (HCC)   Pancreatic insufficiency (HCC)   Memory deficit   Pulmonary fibrosis (HCC)   COPD (chronic obstructive pulmonary disease) (HCC)   S/P TAVR (transcatheter aortic valve replacement)      Past Medical History:  Diagnosis Date  . Adrenal insufficiency, primary, iatrogenic   . Anxiety   . Aortic stenosis, severe 02/26/2013  . Chest pain    myoview 04/13/04-inferoapical thinning with a suggestion of mild apical ischemia, EF 66%  . Depression 03/25/2013  . Gait instability   . Hearing loss 03/25/2013  . Memory deficit 03/25/2013  . Murmur, cardiac   . Osteoporosis   . Palpitations   . Pancreatic insufficiency 11/09/2012  . Physical deconditioning   . Pneumonia   . Pulmonary fibrosis (HCC) 03/25/2013  . Rheumatoid arthritis(714.0)   . SOBOE (shortness of breath on exertion)    echo 02/23/13-EF 55-60%, possible bicuspid aortic valve with mod to severed stenosis   . Syncope   . UTI (lower urinary tract infection) 10/01/2011    Past Surgical History:  Procedure Laterality Date  . ABDOMINAL HYSTERECTOMY    . CARDIAC CATHETERIZATION  11/08/96   no  evidence of CAD, nl LV function  . CHOLECYSTECTOMY    . CORONARY ANGIOGRAM  03/22/2013   Procedure: CORONARY ANGIOGRAM;  Surgeon: [name], MD;  Location: [facility] CATH LAB;  Service: Cardiovascular;;  . KNEE ARTHROSCOPY     2 on left  . RIGHT HEART CATHETERIZATION  03/22/2013   Procedure: RIGHT HEART CATH;  Surgeon: [name], MD;  Location[facility] CATH LAB;  Service: Cardiovascular;;  . SHOULDER SURGERY Left   . THYROIDECTOMY         HPI  from the history and physical done on the day of admission:    [name] a 83 y.o.femalewithhistory of rheumatoid arthritis on immunosuppressant, adrenal insufficiency, hypertension, aortic stenosis status post TAVRwas referred to the ER from skilled nursing facility after patient was found to be weak fatigue is ongoing for last few days with patient being orthostatic. Patient states he has lost her voice last few days and states he has been feeling weak. Has been coughing denies any chest pain. Denies any nausea vomiting or diarrhea.  Patient was recently admitted for ESBL UTI following which patient again was admitted for pneumonia last month.  ED  Course:In the ER patient had a lactate elevated around 3 with leukocytosis chest x-ray unremarkable UA still pending. Given the immunosuppressive state she was empirically started on antibiotics after blood cultures and urine cultures sent. Patient was given fluid bolus and admitted for hydration and possible developing sepsis.     Hospital Course:    Brief Summary:- 83 y.o.FemalewithHistory ofRheumatoidArthritis on immunosuppressant,Adrenal insufficiency,Hypertension, aortic stenosis status post TAVRadmitted 11/20/17 from skilled nursing facility with Generalized Weakness/Fatigue and Orthostatic Hypotension. Patient states she has lost her voice last few days after Nortriptyline capsule opened up and "burnt" her Throat . Recently treated for ESBL E. coli      Plan:- 1)SIRS- No fevers, doubt frank sepsis, lactic acid was elevated most likely due to dehydration, pro- calcitonin was not elevated, patient has leukocytosis most likely due to steroids, blood and urine cultures from 1/31/2019remainnegative so far , no further antibiotics for now (initially received vancomycin, Levaquin and Meropenem),   2)H/o HTN-continue bisoprolol 2.5 mg daily, decreased nortriptyline to 25 mg nightly from 75 mg nightly to reduce orthostatic concerns  3)DM-stable, continue current regimen  4)COPD-no acute exacerbation at this time, patient currently on stress dose steroids, continue bronchodilators, Chestx-ray on admission without acute findings  5)RA-hold immunosuppressants, patient currently on stress dose steroids  6)History ofAdrenal insufficiency- completed Solu-Cortef [stress dose steroids], okay to use prednisone taper, hypokalemia resolved  7)Dehydration-on admission patient's BUN was up to 51 with a creatinine of 1.0, please note that 2 weeks ago BUN and creatinine were 17 and 0.9, I suspect dehydration and prerenal azotemia explains elevated lactic acid noted #1 above, BUN/creatinine is down to20at 0.5respectively  8)Neuropsych-she did much better on Seroquel, no further agitation  Code Status:Full code  Disposition Plan:SNF  , patient's daughter Olegario Messier was previously updated  Discharge Condition: stable  Follow UP  Contact information for after-discharge care    Destination    [name] .   Service:  Skilled Nursing Contact information: [address] [place] [address] [phone]              Diet and Activity recommendation:  As advised  Discharge Instructions    Discharge Instructions    Call MD for:  difficulty breathing, headache or visual disturbances   Complete by:  As directed    Call MD for:  persistant dizziness or light-headedness   Complete by:  As  directed    Call MD for:  persistant nausea and vomiting   Complete by:  As directed    Call MD for:  temperature >100.4   Complete by:  As directed    Diet - low sodium heart healthy   Complete by:  As directed    Discharge instructions   Complete by:  As directed    1)Taper off Prednisone slowly 2)Drink Fluids 3)Avoid Excessive diuretic use   Increase activity slowly   Complete by:  As directed         Discharge Medications     Allergies as of 11/24/2017      Reactions   Adhesive [tape] Other (See Comments)   Skin peels up.    Penicillins Hives   Has patient had a PCN reaction causing immediate rash, facial/tongue/throat swelling, SOB or lightheadedness with hypotension: No Has patient had a PCN reaction causing severe rash involving mucus membranes or skin necrosis: No Has patient had a PCN reaction that required hospitalization No Has patient had a PCN reaction occurring within the last 10 years: No If  all of the above answers are "NO", then may proceed with Cephalosporin use.      Medication List    STOP taking these medications   doxycycline 100 MG tablet Commonly known as:  VIBRA-TABS     TAKE these medications   ALPRAZolam 1 MG tablet Commonly known as:  XANAX Take 0.5 tablets (0.5 mg total) by mouth at bedtime as needed for anxiety. Anxiety What changed:    when to take this  reasons to take this   bisoprolol 5 MG tablet Commonly known as:  ZEBETA Take 0.5 tablets (2.5 mg total) by mouth daily.   buPROPion 100 MG tablet Commonly known as:  WELLBUTRIN Take 100 mg by mouth every morning.   CHLORASEPTIC MAX SORE THROAT 1.5-33 % Liqd Generic drug:  Phenol-Glycerin Use as directed 2 sprays in the mouth or throat 3 (three) times daily as needed (Sore Throat).   furosemide 20 MG tablet Commonly known as:  LASIX Take 1 tablet (20 mg total) by mouth daily as needed. What changed:    medication strength  how much to take  when to take  this  reasons to take this   HYDROcodone-acetaminophen 5-325 MG tablet Commonly known as:  NORCO/VICODIN Take 1 tablet by mouth every 6 (six) hours as needed for moderate pain.   leflunomide 20 MG tablet Commonly known as:  ARAVA Take 1 tablet (20 mg total) by mouth daily.   mirtazapine 30 MG tablet Commonly known as:  REMERON Take 30 mg by mouth at bedtime.   nortriptyline 25 MG capsule Commonly known as:  PAMELOR Take 1 capsule (25 mg total) by mouth at bedtime. What changed:    medication strength  how much to take   ondansetron 4 MG tablet Commonly known as:  ZOFRAN Take 1 tablet (4 mg total) by mouth every 6 (six) hours as needed for nausea.   polyethylene glycol packet Commonly known as:  MIRALAX / GLYCOLAX Take 17 g by mouth daily.   potassium chloride SA 20 MEQ tablet Commonly known as:  K-DUR,KLOR-CON Take 20 mEq by mouth 2 (two) times daily.   predniSONE 10 MG tablet Commonly known as:  DELTASONE Take 40 mg (4 tablets) daily for 3 days, then 3 tablets daily for 3 days, then 2 tablets daily for 3 days, then 1 tablet daily for 4 days and stop What changed:    medication strength  how much to take  how to take this  when to take this  additional instructions   QUEtiapine 25 MG tablet Commonly known as:  SEROQUEL Take 0.5 tablets (12.5 mg total) by mouth every evening.   senna-docusate 8.6-50 MG tablet Commonly known as:  Senokot-S Take 1 tablet by mouth at bedtime as needed for mild constipation.   TRAVATAN Z 0.004 % Soln ophthalmic solution Generic drug:  Travoprost (BAK Free) Place 1 drop into both eyes at bedtime.   vitamin B-12 1000 MCG tablet Commonly known as:  CYANOCOBALAMIN Take 1 tablet (1,000 mcg total) by mouth daily.       Major procedures and Radiology Reports - PLEASE review detailed and final reports for all details, in brief -   Dg Chest 2 View  Result Date: 10/30/2017 CLINICAL DATA:  Status post possible fall with  injury to the ribs. EXAM: CHEST  2 VIEW COMPARISON:  10/19/2017 FINDINGS: Prior sending aortic stent graft repair. Cardiomediastinal silhouette is normal. Mediastinal contours appear intact. Calcific atherosclerotic disease of the aorta. Stable elevation of the left hemidiaphragm.  There is no evidence of focal airspace consolidation, pleural effusion or pneumothorax. Osseous structures are without acute abnormality. Thoracic kyphosis with stable anterior compression deformity of T6 vertebral body. Minimally displaced left fourth posterior rib fracture. Soft tissues are grossly normal. IMPRESSION: Minimally displaced left 4th posterior rib fracture. No evidence of pneumothorax. Electronically Signed   By: Ted Mcalpine M.D.   On: 10/30/2017 20:01   Ct Head Wo Contrast  Result Date: 10/30/2017 CLINICAL DATA:  Status post fall. EXAM: CT HEAD WITHOUT CONTRAST TECHNIQUE: Contiguous axial images were obtained from the base of the skull through the vertex without intravenous contrast. COMPARISON:  10/14/2015 FINDINGS: Brain: No evidence of acute infarction, hemorrhage, hydrocephalus, extra-axial collection or mass lesion/mass effect. Moderate brain parenchymal volume loss and deep white matter microangiopathy. Chronic lacunar infarct of the right thalamus. Vascular: Atherosclerotic calcifications at the intra cavernous carotid arteries. Skull: Normal. Negative for fracture or focal lesion. Sinuses/Orbits: No acute finding. Other: None. IMPRESSION: No evidence of acute traumatic injury to head. Moderate brain parenchymal atrophy and chronic microvascular disease. Chronic right thalamic lacunar infarct. Electronically Signed   By: Ted Mcalpine M.D.   On: 10/30/2017 20:41   Ct Chest Wo Contrast  Result Date: 10/30/2017 CLINICAL DATA:  Fall with rib pain. EXAM: CT CHEST WITHOUT CONTRAST TECHNIQUE: Multidetector CT imaging of the chest was performed following the standard protocol without IV contrast.  COMPARISON:  05/27/2015 FINDINGS: Cardiovascular: Heart size upper normal to mildly enlarged. No pericardial effusion. Patient is status post aortic valve replacement. Coronary artery calcification is evident. Atherosclerotic calcification is noted in the wall of the thoracic aorta. Prominence of the main pulmonary artery raises the question of pulmonary arterial hypertension. Mediastinum/Nodes: No mediastinal lymphadenopathy. No evidence for gross hilar lymphadenopathy although assessment is limited by the lack of intravenous contrast on today's study. Lungs/Pleura: Evaluation of lung parenchyma is obscured scratchy at fine detail of lung parenchyma is obscured by patient breathing motion during image acquisition. Probable emphysema with some interlobular septal thickening in the lung apices. Patchy atelectasis or pneumonia noted posterior left lower lobe. No pneumothorax. No pleural effusion. Upper Abdomen: Unremarkable. Musculoskeletal: Multiple left-sided rib fractures of varying age are identified. Anterior old fractures of lower left ribs evident. Fractures of the posterior tenth, eleventh, and twelfth ribs on the left are nonacute as there is evidence of bony callus, but these are not yet healed. Old anterior right rib fractures identified in the lower ribs but incompletely visualized. IMPRESSION: 1. Posterior fractures of the left tenth, eleventh, and twelfth ribs are at least subacute as bony callus is evident but the fracture lines are still visible consistent with incomplete healing. 2. Old healed fractures are identified in the anterior lower ribs bilaterally. 3. No pneumothorax or pleural effusion. 4. Mild patchy airspace opacity posterior left lower lobe compatible with atelectasis or pneumonia. 5.  Aortic Atherosclerois (ICD10-170.0) 6. Question pulmonary arterial hypertension. Electronically Signed   By: Kennith Center M.D.   On: 10/30/2017 20:44   Dg Chest Port 1 View  Result Date:  11/20/2017 CLINICAL DATA:  Increased weakness over the last few days. EXAM: PORTABLE CHEST 1 VIEW COMPARISON:  Chest radiograph 10/30/2017. FINDINGS: Monitoring leads overlie the patient. Stable cardiac and mediastinal contours. Stent graft Re demonstrated. No consolidative pulmonary opacities. No pleural effusion or pneumothorax. Healing bilateral rib fractures. IMPRESSION: No acute cardiopulmonary process. Electronically Signed   By: Annia Belt M.D.   On: 11/20/2017 18:30    Micro Results   Recent Results (from the  past 240 hour(s))  Blood culture (routine x 2)     Status: None (Preliminary result)   Collection Time: 11/20/17  6:38 PM  Result Value Ref Range Status   Specimen Description   Final    BLOOD LEFT FOREARM Performed at [facility], [address] [place], [place] [address]    Special Requests   Final    IN PEDIATRIC BOTTLE Blood Culture adequate volume Performed at [facility], [address], [place], [place] [address]    Culture   Final    NO GROWTH 3 DAYS Performed at [facility], [address] [place], [place] [address]    Report Status PENDING  Incomplete  Urine culture     Status: None   Collection Time: 11/20/17  8:38 PM  Result Value Ref Range Status   Specimen Description   Final    URINE, RANDOM Performed at [facility], [address], [place], [place] [address]    Special Requests   Final    NONE Performed at [facility], [address] [place], [place] [address]    Culture   Final    NO GROWTH Performed at [facility], [address], [place], [place] [address]    Report Status 11/22/2017 FINAL  Final   Today   Subjective    [name] today has no new complaints, eating and  drinking well, speech pathologist at bedside         Patient has been seen and examined prior to discharge   Objective   Blood pressure 135/84, pulse 85, temperature 98 F (36.7  C), temperature source Axillary, resp. rate 18, height 5\' 3"  (1.6 m), weight 49 kg (108 lb 0.4 oz), SpO2 99 %.   Intake/Output Summary (Last 24 hours) at 11/24/2017 1326 Last data filed at 11/24/2017 1200 Gross per 24 hour  Intake 2326.25 ml  Output 852 ml  Net 1474.25 ml   Exam Gen:- Awake Alert, In no apparent distress ,  HEENT:- Pillow.AT,No sclera icterus Neck-Supple Neck,No JVD,.  Lungs-diminished in bases without wheezing  CV- S1, S2 normal Abd- +ve B.Sounds, Abd Soft, Notenderness,  Extremity/Skin:- No edema,  Neuropsych-generalized weakness without new focal deficits    Data Review   CBC w Diff:  Lab Results  Component Value Date   WBC 12.8 (H) 11/23/2017   HGB 10.5 (L) 11/23/2017   HCT 31.9 (L) 11/23/2017   PLT 79 (L) 11/23/2017   LYMPHOPCT 5 11/20/2017   MONOPCT 3 11/20/2017   EOSPCT 0 11/20/2017   BASOPCT 0 11/20/2017    CMP:  Lab Results  Component Value Date   NA 140 11/23/2017   K 5.1 11/23/2017   CL 112 (H) 11/23/2017   CO2 24 11/23/2017   BUN 20 11/23/2017   CREATININE 0.52 11/23/2017   CREATININE 0.64 03/23/2014   PROT 5.6 (L) 11/20/2017   ALBUMIN 3.1 (L) 11/20/2017   BILITOT 1.0 11/20/2017   ALKPHOS 89 11/20/2017   AST 25 11/20/2017   ALT 16 11/20/2017   Total Discharge time is about 33 minutes  11/22/2017 M.D on 11/24/2017 at 1:26 PM  [facility]   Office  [phone]  Voice Recognition [phone] dictation system was used to create this note, attempts have been made to correct errors. Please contact the author with questions and/or clarifications.

## 2017-11-24 NOTE — Clinical Social Work Note (Signed)
Clinical Social Work Assessment  Patient Details  Name: [name] MRN: [id number] Date of Birth: 06-13-1935  Date of referral:  11/24/17               Reason for consult:  (pt admitted from facility)                Permission sought to share information with:  Family Supports, Oceanographer granted to share information::  Yes, Verbal Permission Granted  Name::     daughter Olegario Messier  Agency::  [name]  Relationship::     Contact Information:     Housing/Transportation Living arrangements for the past 2 months:  Single Family Home(but has been at [facility] for past 3 weeks) Source of Information:  Patient, Facility, Adult Children, Medical Team Patient Interpreter Needed:  None Criminal Activity/Legal Involvement Pertinent to Current Situation/Hospitalization:  No - Comment as needed Significant Relationships:  Adult Children, Other Family Members Lives with:  Self Do you feel safe going back to the place where you live?  Yes Need for family participation in patient care:  Yes (Comment)(daughter involved)  Care giving concerns:  Pt admitted from [name] where she has been receiving short term therapy. Prior to that she was living at home alone and plans to return home once she completes rehab. Daughter shares concern that going forward, they may want to hire additional help for pt at home or even eventually have her move into an assisted living facility. Pt report she is not ready to consider moving out of her home    Social Worker assessment / plan:  CSW consulted as pt is admitted from facility- [name]. Pt wants to return at [place] in order to continue receiving therapy. PT evaluated and SNF recommendation made. [name] advised they have initiated insurance authorization request in order to plan for pt to return at [place].  Completed FL2.   At daughter's request, provided her with information on community resources for  caregivers and assisted living facilities in case these resources are needed in the future.   Plan: return to [name] at [place]. Awaiting pt stability for DC and insurance authorization   Employment status:  Retired Database administrator PT Recommendations:  Skilled Nursing Facility Information / Referral to community resources:  Skilled Nursing Facility  Patient/Family's Response to care:  Appreciative and engaged  Patient/Family's Understanding of and Emotional Response to Diagnosis, Current Treatment, and Prognosis:  Both pt and daughter demonstrate adequate understanding of treatment and plans. Emotionally appropriate. "We want her to keep getting rehab but are starting to wonder if this is the beginning of an gradual decline."  Emotional Assessment Appearance:  Appears stated age Attitude/Demeanor/Rapport:  Engaged Affect (typically observed):  Accepting, Calm Orientation:  Oriented to Self, Oriented to Place, Oriented to  Time, Oriented to Situation Alcohol / Substance use:  Not Applicable Psych involvement (Current and /or in the community):  No (Comment)  Discharge Needs  Concerns to be addressed:  Care Coordination Readmission within the last 30 days:  Yes Current discharge risk:  Dependent with Mobility Barriers to Discharge:  Insurance Authorization   Nelwyn Salisbury, LCSW 11/24/2017, 12:27 PM [phone]

## 2017-11-25 LAB — CULTURE, BLOOD (ROUTINE X 2)
Culture: NO GROWTH
Special Requests: ADEQUATE

## 2018-02-11 ENCOUNTER — Other Ambulatory Visit: Payer: Self-pay | Admitting: Dermatology

## 2018-03-07 ENCOUNTER — Other Ambulatory Visit: Payer: Self-pay

## 2018-03-07 ENCOUNTER — Emergency Department (HOSPITAL_COMMUNITY)
Admission: EM | Admit: 2018-03-07 | Discharge: 2018-03-08 | Disposition: A | Discharge status: Home / Self Care | Payer: Medicare Other | Attending: Emergency Medicine | Admitting: Emergency Medicine

## 2018-03-07 ENCOUNTER — Encounter (HOSPITAL_COMMUNITY): Payer: Self-pay | Admitting: Emergency Medicine

## 2018-03-07 DIAGNOSIS — S0083XS Contusion of other part of head, sequela: Secondary | ICD-10-CM | POA: Diagnosis not present

## 2018-03-07 DIAGNOSIS — Z79899 Other long term (current) drug therapy: Secondary | ICD-10-CM | POA: Diagnosis not present

## 2018-03-07 DIAGNOSIS — W1830XD Fall on same level, unspecified, subsequent encounter: Secondary | ICD-10-CM | POA: Insufficient documentation

## 2018-03-07 DIAGNOSIS — R609 Edema, unspecified: Secondary | ICD-10-CM

## 2018-03-07 DIAGNOSIS — R2242 Localized swelling, mass and lump, left lower limb: Secondary | ICD-10-CM | POA: Insufficient documentation

## 2018-03-07 LAB — BASIC METABOLIC PANEL
Anion gap: 10 (ref 5–15)
BUN: 31 mg/dL — AB (ref 6–20)
CO2: 25 mmol/L (ref 22–32)
Calcium: 9.2 mg/dL (ref 8.9–10.3)
Chloride: 108 mmol/L (ref 101–111)
Creatinine, Ser: 0.74 mg/dL (ref 0.44–1.00)
GFR calc Af Amer: >60 mL/min (ref >60)
GLUCOSE: 69 mg/dL (ref 65–99)
POTASSIUM: 3.9 mmol/L (ref 3.5–5.1)
Sodium: 143 mmol/L (ref 135–145)

## 2018-03-07 LAB — CBC WITH DIFFERENTIAL/PLATELET
Basophils Absolute: 0.1 10*3/uL (ref 0.0–0.1)
Basophils Relative: 1 %
EOS PCT: 1 %
Eosinophils Absolute: 0.1 10*3/uL (ref 0.0–0.7)
HCT: 41 % (ref 36.0–46.0)
Hemoglobin: 13.3 g/dL (ref 12.0–15.0)
LYMPHS ABS: 2.2 10*3/uL (ref 0.7–4.0)
LYMPHS PCT: 23 %
MCH: 30.9 pg (ref 26.0–34.0)
MCHC: 32.4 g/dL (ref 30.0–36.0)
MCV: 95.3 fL (ref 78.0–100.0)
MONO ABS: 1.4 10*3/uL — AB (ref 0.1–1.0)
MONOS PCT: 15 %
Neutro Abs: 6.1 10*3/uL (ref 1.7–7.7)
Neutrophils Relative %: 62 %
PLATELETS: 172 10*3/uL (ref 150–400)
RBC: 4.3 MIL/uL (ref 3.87–5.11)
RDW: 14.6 % (ref 11.5–15.5)
WBC: 9.9 10*3/uL (ref 4.0–10.5)

## 2018-03-07 LAB — I-STAT CG4 LACTIC ACID, ED: Lactic Acid, Venous: 0.62 mmol/L (ref 0.5–1.9)

## 2018-03-07 NOTE — ED Triage Notes (Signed)
Pt arriving POV with left leg swelling following a fall from last Thursday. Family member reports the swelling has only gotten worse and believes area may be infected. Family member reports wound has increased drainage.

## 2018-03-07 NOTE — ED Provider Notes (Signed)
[facility]-EMERGENCY DEPT Provider Note: Lowella Dell, MD, FACEP  CSN: [id number] MRN: [id number] ARRIVAL: 03/07/18 at 2029 ROOM: WA01/WA01   CHIEF COMPLAINT  Leg Swelling (left)  Level 5 caveat: Memory deficits, confusion HISTORY OF PRESENT ILLNESS  03/07/18 10:59 PM [name] is a 83 y.o. female with multiple medical problems.  She is here with swelling and discoloration of her left leg.  Per nursing notes this is the result of a fall "last Thursday".  The patient herself cannot tell me when this happened but thinks it may have been "about a month ago".  There has been weeping from the left lower leg and it was bandaged prior to arrival.  She states it is only minimally painful.  She denies chest pain or shortness of breath.  She mentions blood clot but her medical records do not list deep vein thrombosis or anticoagulation; the patient's last available lower extremity Doppler was in 2016 which was negative for DVT.  She also has ecchymosis of her chin which she also relates to a fall "Thursday".  She denies pain associated with this.   Past Medical History:  Diagnosis Date  . Adrenal insufficiency, primary, iatrogenic   . Anxiety   . Aortic stenosis, severe 02/26/2013  . Chest pain    myoview 04/13/04-inferoapical thinning with a suggestion of mild apical ischemia, EF 66%  . Depression 03/25/2013  . Gait instability   . Hearing loss 03/25/2013  . Memory deficit 03/25/2013  . Murmur, cardiac   . Osteoporosis   . Palpitations   . Pancreatic insufficiency 11/09/2012  . Physical deconditioning   . Pneumonia   . Pulmonary fibrosis (HCC) 03/25/2013  . Rheumatoid arthritis(714.0)   . SOBOE (shortness of breath on exertion)    echo 02/23/13-EF 55-60%, possible bicuspid aortic valve with mod to severed stenosis   . Syncope   . UTI (lower urinary tract infection) 10/01/2011    Past Surgical History:  Procedure Laterality Date  . ABDOMINAL HYSTERECTOMY    . CARDIAC CATHETERIZATION  11/08/96     no evidence of CAD, nl LV function  . CHOLECYSTECTOMY    . CORONARY ANGIOGRAM  03/22/2013   Procedure: CORONARY ANGIOGRAM;  Surgeon: [name], MD;  Location: [facility] CATH LAB;  Service: Cardiovascular;;  . KNEE ARTHROSCOPY     2 on left  . RIGHT HEART CATHETERIZATION  03/22/2013   Procedure: RIGHT HEART CATH;  Surgeon: [name], MD;  Location: [facility] CATH LAB;  Service: Cardiovascular;;  . SHOULDER SURGERY Left   . THYROIDECTOMY      Family History  Problem Relation Age of Onset  . Cancer - Colon Father   . Stroke Brother   . Cancer Sister        spine?    Social History   Tobacco Use  . Smoking status: Never Smoker  . Smokeless tobacco: Never Used  Substance Use Topics  . Alcohol use: No  . Drug use: No    Prior to Admission medications   Medication Sig Start Date End Date Taking? Authorizing Provider  ALPRAZolam Prudy Feeler) 1 MG tablet Take 0.5 tablets (0.5 mg total) by mouth at bedtime as needed for anxiety. Anxiety 11/24/17   Shon Hale, MD  bisoprolol (ZEBETA) 5 MG tablet Take 0.5 tablets (2.5 mg total) by mouth daily. 07/24/16   Hilty, Lisette Abu, MD  buPROPion (WELLBUTRIN) 100 MG tablet Take 100 mg by mouth every morning. 10/06/15   [provider]  furosemide (LASIX) 20 MG  tablet Take 1 tablet (20 mg total) by mouth daily as needed. 11/24/17 11/24/18  Shon Hale, MD  HYDROcodone-acetaminophen (NORCO/VICODIN) 5-325 MG tablet Take 1 tablet by mouth every 6 (six) hours as needed for moderate pain. 11/24/17   Shon Hale, MD  leflunomide (ARAVA) 20 MG tablet Take 1 tablet (20 mg total) by mouth daily. 11/10/17   Tyrone Nine, MD  mirtazapine (REMERON) 30 MG tablet Take 30 mg by mouth at bedtime.     [provider]  nortriptyline (PAMELOR) 25 MG capsule Take 1 capsule (25 mg total) by mouth at bedtime. 11/24/17   Shon Hale, MD  ondansetron (ZOFRAN) 4 MG tablet Take 1 tablet (4 mg total) by mouth every 6 (six) hours as needed for nausea.  11/24/17   Shon Hale, MD  Phenol-Glycerin (CHLORASEPTIC MAX SORE THROAT) 1.5-33 % LIQD Use as directed 2 sprays in the mouth or throat 3 (three) times daily as needed (Sore Throat).    [provider]  polyethylene glycol (MIRALAX / GLYCOLAX) packet Take 17 g by mouth daily.    [provider]  potassium chloride SA (K-DUR,KLOR-CON) 20 MEQ tablet Take 20 mEq by mouth 2 (two) times daily.    [provider]  predniSONE (DELTASONE) 10 MG tablet Take 40 mg (4 tablets) daily for 3 days, then 3 tablets daily for 3 days, then 2 tablets daily for 3 days, then 1 tablet daily for 4 days and stop 11/24/17   Shon Hale, MD  QUEtiapine (SEROQUEL) 25 MG tablet Take 0.5 tablets (12.5 mg total) by mouth every evening. 11/24/17   Shon Hale, MD  senna-docusate (SENOKOT-S) 8.6-50 MG tablet Take 1 tablet by mouth at bedtime as needed for mild constipation.    [provider]  TRAVATAN Z 0.004 % SOLN ophthalmic solution Place 1 drop into both eyes at bedtime. 09/16/15   [provider]  vitamin B-12 (CYANOCOBALAMIN) 1000 MCG tablet Take 1 tablet (1,000 mcg total) by mouth daily. 03/22/15   Osvaldo Shipper, MD    Allergies Adhesive [tape] and Penicillins   REVIEW OF SYSTEMS     PHYSICAL EXAMINATION  Initial Vital Signs Blood pressure (!) 160/67, pulse (!) 107, temperature 97.8 F (36.6 C), temperature source Oral, resp. rate 16, SpO2 98 %.  Examination General: Well-developed, well-nourished female in no acute distress; appearance consistent with age of record HENT: normocephalic; ecchymosis of chin without tenderness or deformity:   Eyes: pupils equal, round and reactive to light; extraocular muscles intact Neck: supple Heart: regular rate and rhythm; distant sounds Lungs: clear to auscultation bilaterally; distant sounds Abdomen: soft; nondistended; nontender; bowel sounds present Extremities: Chronic appearing arthritic changes; pulses normal;  edema of lower legs, left greater than right; discoloration of skin of lower legs, more pronounced on the left with weeping but no warmth:     Neurologic: Awake, alert and oriented x 2; motor function intact in all extremities and symmetric; no facial droop Skin: Warm and dry; skin changes to left lower leg as noted above Psychiatric: Normal mood and affect   RESULTS  Summary of this visit's results, reviewed by myself:   EKG Interpretation  Date/Time:    Ventricular Rate:    PR Interval:    QRS Duration:   QT Interval:    QTC Calculation:   R Axis:     Text Interpretation:        Laboratory Studies: Results for orders placed or performed during the hospital encounter of 03/07/18 (from the past 24  hour(s))  CBC with Differential     Status: Abnormal   Collection Time: 03/07/18  9:27 PM  Result Value Ref Range   WBC 9.9 4.0 - 10.5 K/uL   RBC 4.30 3.87 - 5.11 MIL/uL   Hemoglobin 13.3 12.0 - 15.0 g/dL   HCT 07.1 21.9 - 75.8 %   MCV 95.3 78.0 - 100.0 fL   MCH 30.9 26.0 - 34.0 pg   MCHC 32.4 30.0 - 36.0 g/dL   RDW 83.2 54.9 - 82.6 %   Platelets 172 150 - 400 K/uL   Neutrophils Relative % 62 %   Neutro Abs 6.1 1.7 - 7.7 K/uL   Lymphocytes Relative 23 %   Lymphs Abs 2.2 0.7 - 4.0 K/uL   Monocytes Relative 15 %   Monocytes Absolute 1.4 (H) 0.1 - 1.0 K/uL   Eosinophils Relative 1 %   Eosinophils Absolute 0.1 0.0 - 0.7 K/uL   Basophils Relative 1 %   Basophils Absolute 0.1 0.0 - 0.1 K/uL  Basic metabolic panel     Status: Abnormal   Collection Time: 03/07/18  9:27 PM  Result Value Ref Range   Sodium 143 135 - 145 mmol/L   Potassium 3.9 3.5 - 5.1 mmol/L   Chloride 108 101 - 111 mmol/L   CO2 25 22 - 32 mmol/L   Glucose, Bld 69 65 - 99 mg/dL   BUN 31 (H) 6 - 20 mg/dL   Creatinine, Ser 4.15 0.44 - 1.00 mg/dL   Calcium 9.2 8.9 - 83.0 mg/dL   GFR calc non Af Amer >60 >60 mL/min   GFR calc Af Amer >60 >60 mL/min   Anion gap 10 5 - 15  I-Stat CG4 Lactic Acid, ED      Status: None   Collection Time: 03/07/18  9:33 PM  Result Value Ref Range   Lactic Acid, Venous 0.62 0.5 - 1.9 mmol/L  D-dimer, quantitative (not at [facility])     Status: Abnormal   Collection Time: 03/07/18 11:14 PM  Result Value Ref Range   D-Dimer, Quant 0.54 (H) 0.00 - 0.50 ug/mL-FEU   Imaging Studies: No results found.  ED COURSE and MDM  Nursing notes and initial vitals signs, including pulse oximetry, reviewed.  Vitals:   03/07/18 2114 03/07/18 2255  BP: (!) 160/67 (!) 157/76  Pulse: (!) 107 97  Resp: 16   Temp: 97.8 F (36.6 C)   TempSrc: Oral   SpO2: 98% 97%   12:34 AM Age-adjusted d-dimer is 0.81.  The patient's leg is not warm and the skin changes appear chronic.  Her d-dimer, age-adjusted, is within normal limits.  Because it is unclear how long her leg is been swollen and discolored we will have her return for Doppler ultrasound later today.  I do not wish to start anticoagulants at this time due to low suspicion of DVT and recent injuries which may be prone to bleeding.  PROCEDURES    ED DIAGNOSES     ICD-10-CM   1. Peripheral edema R60.9   2. Contusion of chin, sequela S00.83XS        Izic Stfort, Jonny Ruiz, MD 03/08/18 [phone]

## 2018-03-08 ENCOUNTER — Ambulatory Visit (HOSPITAL_BASED_OUTPATIENT_CLINIC_OR_DEPARTMENT_OTHER)
Admission: RE | Admit: 2018-03-08 | Discharge: 2018-03-08 | Disposition: A | Discharge status: Home / Self Care | Payer: Medicare Other | Source: Ambulatory Visit | Attending: Emergency Medicine | Admitting: Emergency Medicine

## 2018-03-08 DIAGNOSIS — W19XXXA Unspecified fall, initial encounter: Secondary | ICD-10-CM | POA: Insufficient documentation

## 2018-03-08 DIAGNOSIS — M7989 Other specified soft tissue disorders: Secondary | ICD-10-CM

## 2018-03-08 DIAGNOSIS — S8012XA Contusion of left lower leg, initial encounter: Secondary | ICD-10-CM

## 2018-03-08 LAB — D-DIMER, QUANTITATIVE (NOT AT ARMC): D DIMER QUANT: 0.54 ug{FEU}/mL — AB (ref 0.00–0.50)

## 2018-03-08 NOTE — Progress Notes (Signed)
VASCULAR LAB PRELIMINARY  PRELIMINARY  PRELIMINARY  PRELIMINARY  Left lower extremity venous duplex completed.    Preliminary report:  There is no acute DVT or SVT noted in the left lower extremity.  Ethyle Tiedt, RVT 03/08/2018, 8:52 AM

## 2018-05-21 ENCOUNTER — Other Ambulatory Visit: Payer: Self-pay

## 2018-05-21 DIAGNOSIS — I872 Venous insufficiency (chronic) (peripheral): Secondary | ICD-10-CM

## 2018-06-23 ENCOUNTER — Telehealth: Payer: Self-pay | Admitting: *Deleted

## 2018-06-23 NOTE — Telephone Encounter (Signed)
[name] [facility] asked if received the order and if we would make the appt.

## 2018-07-24 ENCOUNTER — Ambulatory Visit (INDEPENDENT_AMBULATORY_CARE_PROVIDER_SITE_OTHER): Payer: Medicare Other | Admitting: Vascular Surgery

## 2018-07-24 ENCOUNTER — Other Ambulatory Visit: Payer: Self-pay | Admitting: Vascular Surgery

## 2018-07-24 ENCOUNTER — Ambulatory Visit (HOSPITAL_COMMUNITY)
Admission: RE | Admit: 2018-07-24 | Discharge: 2018-07-24 | Disposition: A | Discharge status: Home / Self Care | Payer: Medicare Other | Source: Ambulatory Visit | Attending: Vascular Surgery | Admitting: Vascular Surgery

## 2018-07-24 ENCOUNTER — Encounter: Payer: Self-pay | Admitting: Vascular Surgery

## 2018-07-24 ENCOUNTER — Other Ambulatory Visit: Payer: Self-pay

## 2018-07-24 VITALS — BP 147/75 | HR 87 | Temp 98.1°F | Resp 14 | Ht 63.0 in | Wt 119.0 lb

## 2018-07-24 DIAGNOSIS — I872 Venous insufficiency (chronic) (peripheral): Secondary | ICD-10-CM | POA: Diagnosis not present

## 2018-07-24 NOTE — Progress Notes (Signed)
Vitals:   07/24/18 1130  BP: (!) 147/75  Pulse: 87  Resp: 14  Temp: 98.1 F (36.7 C)  TempSrc: Oral  Weight: 119 lb (54 kg)  Height: 5\' 3"  (1.6 m)

## 2018-07-24 NOTE — Progress Notes (Signed)
Patient ID: [name], female   DOB: 04/08/1935, 84 y.o.   MRN: [id number]  Reason for Consult: New Patient (Initial Visit) (venoue insuff)   Referred by Jarome Matin, MD  Subjective:     HPI:  [name] is a 83 y.o. female history of left lower extremity swelling that has been going on for many years.  She is now is beginning to have swelling in her right lower extremity.  She was diagnosed with a DVT at the nursing home will start on Eliquis which she continues on.  She cannot wear compression stockings due to the difficulty in getting a month.  She has had some skin tears ulceration.  Presents today with venous reflux studies does not currently have any sores on her feet.  Past Medical History:  Diagnosis Date  . Adrenal insufficiency, primary, iatrogenic   . Anxiety   . Aortic stenosis, severe 02/26/2013  . Chest pain    myoview 04/13/04-inferoapical thinning with a suggestion of mild apical ischemia, EF 66%  . Depression 03/25/2013  . Gait instability   . Hearing loss 03/25/2013  . Memory deficit 03/25/2013  . Murmur, cardiac   . Osteoporosis   . Palpitations   . Pancreatic insufficiency 11/09/2012  . Physical deconditioning   . Pneumonia   . Pulmonary fibrosis (HCC) 03/25/2013  . Rheumatoid arthritis(714.0)   . SOBOE (shortness of breath on exertion)    echo 02/23/13-EF 55-60%, possible bicuspid aortic valve with mod to severed stenosis   . Syncope   . UTI (lower urinary tract infection) 10/01/2011   Family History  Problem Relation Age of Onset  . Cancer - Colon Father   . Stroke Brother   . Cancer Sister        spine?   Past Surgical History:  Procedure Laterality Date  . ABDOMINAL HYSTERECTOMY    . CARDIAC CATHETERIZATION  11/08/96   no evidence of CAD, nl LV function  . CHOLECYSTECTOMY    . CORONARY ANGIOGRAM  03/22/2013   Procedure: CORONARY ANGIOGRAM;  Surgeon: [name], MD;  Location: [facility] CATH LAB;  Service: Cardiovascular;;  . KNEE ARTHROSCOPY      2 on left  . RIGHT HEART CATHETERIZATION  03/22/2013   Procedure: RIGHT HEART CATH;  Surgeon: [name], MD;  Location: [facility] CATH LAB;  Service: Cardiovascular;;  . SHOULDER SURGERY Left   . THYROIDECTOMY      Short Social History:  Social History   Tobacco Use  . Smoking status: Never Smoker  . Smokeless tobacco: Never Used  Substance Use Topics  . Alcohol use: No    Allergies  Allergen Reactions  . Penicillins Hives    Has patient had a PCN reaction causing immediate rash, facial/tongue/throat swelling, SOB or lightheadedness with hypotension: No Has patient had a PCN reaction causing severe rash involving mucus membranes or skin necrosis: No Has patient had a PCN reaction that required hospitalization No Has patient had a PCN reaction occurring within the last 10 years: No If all of the above answers are "NO", then may proceed with Cephalosporin use.   . Tape Other (See Comments)    Skin peels up.  Skin peels up.     Current Outpatient Medications  Medication Sig Dispense Refill  . ALPRAZolam (XANAX) 1 MG tablet Take 0.5 tablets (0.5 mg total) by mouth at bedtime as needed for anxiety. Anxiety 10 tablet 0  . apixaban (ELIQUIS) 5 MG TABS tablet TAKE 1 TABLET BY MOUTH  TWICE A DAY    . bisoprolol (ZEBETA) 5 MG tablet Take 0.5 tablets (2.5 mg total) by mouth daily. 30 tablet 6  . buPROPion (WELLBUTRIN) 100 MG tablet Take 100 mg by mouth every morning.  5  . leflunomide (ARAVA) 20 MG tablet Take 1 tablet (20 mg total) by mouth daily.    . mirtazapine (REMERON) 30 MG tablet Take 30 mg by mouth at bedtime.     . nortriptyline (PAMELOR) 25 MG capsule Take 1 capsule (25 mg total) by mouth at bedtime. 30 capsule 0  . predniSONE (DELTASONE) 10 MG tablet Take 40 mg (4 tablets) daily for 3 days, then 3 tablets daily for 3 days, then 2 tablets daily for 3 days, then 1 tablet daily for 4 days and stop 60 tablet 0  . TRAVATAN Z 0.004 % SOLN ophthalmic solution Place 1 drop into  both eyes at bedtime.  1  . vitamin B-12 (CYANOCOBALAMIN) 1000 MCG tablet Take 1 tablet (1,000 mcg total) by mouth daily. 30 tablet 0  . furosemide (LASIX) 20 MG tablet Take 1 tablet (20 mg total) by mouth daily as needed. (Patient not taking: Reported on 07/24/2018) 20 tablet 11  . HYDROcodone-acetaminophen (NORCO/VICODIN) 5-325 MG tablet Take 1 tablet by mouth every 6 (six) hours as needed for moderate pain. (Patient not taking: Reported on 07/24/2018) 12 tablet 0  . ondansetron (ZOFRAN) 4 MG tablet Take 1 tablet (4 mg total) by mouth every 6 (six) hours as needed for nausea. (Patient not taking: Reported on 07/24/2018) 20 tablet 0  . Phenol-Glycerin (CHLORASEPTIC MAX SORE THROAT) 1.5-33 % LIQD Use as directed 2 sprays in the mouth or throat 3 (three) times daily as needed (Sore Throat).    . polyethylene glycol (MIRALAX / GLYCOLAX) packet Take 17 g by mouth daily.    . potassium chloride SA (K-DUR,KLOR-CON) 20 MEQ tablet Take 20 mEq by mouth 2 (two) times daily.    . QUEtiapine (SEROQUEL) 25 MG tablet Take 0.5 tablets (12.5 mg total) by mouth every evening. (Patient not taking: Reported on 07/24/2018) 30 tablet 0  . senna-docusate (SENOKOT-S) 8.6-50 MG tablet Take 1 tablet by mouth at bedtime as needed for mild constipation.     No current facility-administered medications for this visit.     Review of Systems  Constitutional:  Constitutional negative. HENT: HENT negative.  Eyes: Eyes negative.  Cardiovascular: Positive for dyspnea with exertion and leg swelling.  GI: Gastrointestinal negative.  Musculoskeletal: Positive for leg pain.  Skin: Negative for rash and wound.  Neurological: Neurological negative. Hematologic: Positive for bruises/bleeds easily.  Psychiatric: Psychiatric negative.        Objective:  Objective   Vitals:   07/24/18 1130  BP: (!) 147/75  Pulse: 87  Resp: 14  Temp: 98.1 F (36.7 C)  TempSrc: Oral  Weight: 119 lb (54 kg)  Height: 5\' 3"  (1.6 m)   Body  mass index is 21.08 kg/m.  Physical Exam  Constitutional: She is oriented to person, place, and time. She appears well-developed.  HENT:  Head: Normocephalic.  Eyes: Pupils are equal, round, and reactive to light.  Neck: Normal range of motion. Neck supple.  Cardiovascular: Normal rate.  Pulses:      Radial pulses are 2+ on the right side, and 2+ on the left side.       Dorsalis pedis pulses are 2+ on the right side, and 2+ on the left side.  Pulmonary/Chest: Effort normal.  Abdominal: Soft. She exhibits no mass.  Musculoskeletal: She exhibits edema.  Neurological: She is alert and oriented to person, place, and time.  Skin: Capillary refill takes less than 2 seconds. No rash noted.  Psychiatric: She has a normal mood and affect. Her behavior is normal. Judgment and thought content normal.    Data: I have independently interpreted her venous reflux study which demonstrates her only superficial venous reflux 1114 ms in the distal thigh.  Saphenofemoral junctions right 0.66 cm left 0.74 cm   Assessment/Plan:     83 year old female with bilateral lower extremity swelling.  Some of this may be due to the venous reflux on the right side although I do not think there is any indication for intervention is likely multifactorial nature.  We will get her set up for home physical therapy and she also would benefit from compressive wraps of her bilateral lower extremities which have not been performed to this date.  She can see me on an as-needed basis for this issue.     Maeola Harman MD [facility]

## 2019-04-24 ENCOUNTER — Other Ambulatory Visit: Payer: Self-pay

## 2019-04-24 ENCOUNTER — Emergency Department (HOSPITAL_COMMUNITY): Payer: Medicare Other

## 2019-04-24 ENCOUNTER — Encounter (HOSPITAL_COMMUNITY): Payer: Self-pay

## 2019-04-24 ENCOUNTER — Inpatient Hospital Stay (HOSPITAL_COMMUNITY)
Admission: EM | Admit: 2019-04-24 | Discharge: 2019-05-01 | DRG: 199 | Disposition: A | Discharge status: Skilled Nursing Facility | Payer: Medicare Other | Attending: Family Medicine | Admitting: Family Medicine

## 2019-04-24 DIAGNOSIS — Z79899 Other long term (current) drug therapy: Secondary | ICD-10-CM

## 2019-04-24 DIAGNOSIS — Y92003 Bedroom of unspecified non-institutional (private) residence as the place of occurrence of the external cause: Secondary | ICD-10-CM

## 2019-04-24 DIAGNOSIS — I429 Cardiomyopathy, unspecified: Secondary | ICD-10-CM | POA: Diagnosis present

## 2019-04-24 DIAGNOSIS — Z20828 Contact with and (suspected) exposure to other viral communicable diseases: Secondary | ICD-10-CM | POA: Diagnosis present

## 2019-04-24 DIAGNOSIS — R64 Cachexia: Secondary | ICD-10-CM | POA: Diagnosis present

## 2019-04-24 DIAGNOSIS — I872 Venous insufficiency (chronic) (peripheral): Secondary | ICD-10-CM | POA: Diagnosis present

## 2019-04-24 DIAGNOSIS — M069 Rheumatoid arthritis, unspecified: Secondary | ICD-10-CM | POA: Diagnosis present

## 2019-04-24 DIAGNOSIS — I951 Orthostatic hypotension: Secondary | ICD-10-CM | POA: Diagnosis present

## 2019-04-24 DIAGNOSIS — Z6821 Body mass index (BMI) 21.0-21.9, adult: Secondary | ICD-10-CM

## 2019-04-24 DIAGNOSIS — Y831 Surgical operation with implant of artificial internal device as the cause of abnormal reaction of the patient, or of later complication, without mention of misadventure at the time of the procedure: Secondary | ICD-10-CM | POA: Diagnosis present

## 2019-04-24 DIAGNOSIS — I5041 Acute combined systolic (congestive) and diastolic (congestive) heart failure: Secondary | ICD-10-CM | POA: Diagnosis present

## 2019-04-24 DIAGNOSIS — E119 Type 2 diabetes mellitus without complications: Secondary | ICD-10-CM

## 2019-04-24 DIAGNOSIS — S270XXA Traumatic pneumothorax, initial encounter: Secondary | ICD-10-CM | POA: Diagnosis not present

## 2019-04-24 DIAGNOSIS — Y92009 Unspecified place in unspecified non-institutional (private) residence as the place of occurrence of the external cause: Secondary | ICD-10-CM

## 2019-04-24 DIAGNOSIS — M810 Age-related osteoporosis without current pathological fracture: Secondary | ICD-10-CM | POA: Diagnosis present

## 2019-04-24 DIAGNOSIS — E271 Primary adrenocortical insufficiency: Secondary | ICD-10-CM | POA: Diagnosis present

## 2019-04-24 DIAGNOSIS — J449 Chronic obstructive pulmonary disease, unspecified: Secondary | ICD-10-CM | POA: Diagnosis present

## 2019-04-24 DIAGNOSIS — F329 Major depressive disorder, single episode, unspecified: Secondary | ICD-10-CM | POA: Diagnosis present

## 2019-04-24 DIAGNOSIS — Z9181 History of falling: Secondary | ICD-10-CM

## 2019-04-24 DIAGNOSIS — J9621 Acute and chronic respiratory failure with hypoxia: Secondary | ICD-10-CM | POA: Diagnosis present

## 2019-04-24 DIAGNOSIS — E2740 Unspecified adrenocortical insufficiency: Secondary | ICD-10-CM | POA: Diagnosis present

## 2019-04-24 DIAGNOSIS — I313 Pericardial effusion (noninflammatory): Secondary | ICD-10-CM | POA: Diagnosis present

## 2019-04-24 DIAGNOSIS — I472 Ventricular tachycardia: Secondary | ICD-10-CM | POA: Diagnosis not present

## 2019-04-24 DIAGNOSIS — Z8619 Personal history of other infectious and parasitic diseases: Secondary | ICD-10-CM

## 2019-04-24 DIAGNOSIS — T8203XA Leakage of heart valve prosthesis, initial encounter: Secondary | ICD-10-CM | POA: Diagnosis present

## 2019-04-24 DIAGNOSIS — D696 Thrombocytopenia, unspecified: Secondary | ICD-10-CM | POA: Diagnosis present

## 2019-04-24 DIAGNOSIS — Y712 Prosthetic and other implants, materials and accessory cardiovascular devices associated with adverse incidents: Secondary | ICD-10-CM | POA: Diagnosis present

## 2019-04-24 DIAGNOSIS — Z8701 Personal history of pneumonia (recurrent): Secondary | ICD-10-CM

## 2019-04-24 DIAGNOSIS — J8410 Pulmonary fibrosis, unspecified: Secondary | ICD-10-CM | POA: Diagnosis present

## 2019-04-24 DIAGNOSIS — E11649 Type 2 diabetes mellitus with hypoglycemia without coma: Secondary | ICD-10-CM | POA: Diagnosis not present

## 2019-04-24 DIAGNOSIS — R531 Weakness: Secondary | ICD-10-CM

## 2019-04-24 DIAGNOSIS — J939 Pneumothorax, unspecified: Secondary | ICD-10-CM

## 2019-04-24 DIAGNOSIS — Z7901 Long term (current) use of anticoagulants: Secondary | ICD-10-CM

## 2019-04-24 DIAGNOSIS — R296 Repeated falls: Secondary | ICD-10-CM | POA: Diagnosis present

## 2019-04-24 DIAGNOSIS — M0689 Other specified rheumatoid arthritis, multiple sites: Secondary | ICD-10-CM | POA: Diagnosis present

## 2019-04-24 DIAGNOSIS — K8689 Other specified diseases of pancreas: Secondary | ICD-10-CM | POA: Diagnosis present

## 2019-04-24 DIAGNOSIS — F419 Anxiety disorder, unspecified: Secondary | ICD-10-CM | POA: Diagnosis present

## 2019-04-24 DIAGNOSIS — Z91048 Other nonmedicinal substance allergy status: Secondary | ICD-10-CM

## 2019-04-24 DIAGNOSIS — Z952 Presence of prosthetic heart valve: Secondary | ICD-10-CM

## 2019-04-24 DIAGNOSIS — W01190A Fall on same level from slipping, tripping and stumbling with subsequent striking against furniture, initial encounter: Secondary | ICD-10-CM | POA: Diagnosis present

## 2019-04-24 DIAGNOSIS — S2241XA Multiple fractures of ribs, right side, initial encounter for closed fracture: Secondary | ICD-10-CM

## 2019-04-24 DIAGNOSIS — Z880 Allergy status to penicillin: Secondary | ICD-10-CM

## 2019-04-24 DIAGNOSIS — W19XXXA Unspecified fall, initial encounter: Secondary | ICD-10-CM

## 2019-04-24 LAB — CBC WITH DIFFERENTIAL/PLATELET
Abs Immature Granulocytes: 0.13 10*3/uL — ABNORMAL HIGH (ref 0.00–0.07)
Basophils Absolute: 0.1 10*3/uL (ref 0.0–0.1)
Basophils Relative: 1 %
Eosinophils Absolute: 0.2 10*3/uL (ref 0.0–0.5)
Eosinophils Relative: 2 %
HCT: 38.1 % (ref 36.0–46.0)
Hemoglobin: 11.8 g/dL — ABNORMAL LOW (ref 12.0–15.0)
Immature Granulocytes: 1 %
Lymphocytes Relative: 17 %
Lymphs Abs: 1.7 10*3/uL (ref 0.7–4.0)
MCH: 30.6 pg (ref 26.0–34.0)
MCHC: 31 g/dL (ref 30.0–36.0)
MCV: 99 fL (ref 80.0–100.0)
Monocytes Absolute: 0.9 10*3/uL (ref 0.1–1.0)
Monocytes Relative: 9 %
Neutro Abs: 7.3 10*3/uL (ref 1.7–7.7)
Neutrophils Relative %: 70 %
Platelets: 153 10*3/uL (ref 150–400)
RBC: 3.85 MIL/uL — ABNORMAL LOW (ref 3.87–5.11)
RDW: 13.9 % (ref 11.5–15.5)
WBC: 10.3 10*3/uL (ref 4.0–10.5)
nRBC: 0 % (ref 0.0–0.2)

## 2019-04-24 NOTE — ED Notes (Signed)
Bed: WA20 Expected date:  Expected time:  Means of arrival:  Comments: EMS 84 yo female fall-hit side of bed and hit rib cage-diminished lung sounds-pain meds

## 2019-04-24 NOTE — ED Notes (Signed)
Pt placed on 2L [place]

## 2019-04-24 NOTE — ED Triage Notes (Signed)
Per EMS, Pt is coming from home. Pt had a fall, and landed on her right ribs. No signs of trauma, diminished lung sounds on right side. 90% on room air, 100% after administering 4L [place]. No LOC, no deviation from baseline for mental status. Pt 12 concerning, reviewed by EDP at [facility] before diverted to our ED.

## 2019-04-25 ENCOUNTER — Inpatient Hospital Stay (HOSPITAL_COMMUNITY): Payer: Medicare Other

## 2019-04-25 ENCOUNTER — Emergency Department (HOSPITAL_COMMUNITY): Payer: Medicare Other

## 2019-04-25 ENCOUNTER — Encounter (HOSPITAL_COMMUNITY): Payer: Self-pay | Admitting: Emergency Medicine

## 2019-04-25 DIAGNOSIS — J42 Unspecified chronic bronchitis: Secondary | ICD-10-CM | POA: Diagnosis not present

## 2019-04-25 DIAGNOSIS — W19XXXA Unspecified fall, initial encounter: Secondary | ICD-10-CM

## 2019-04-25 DIAGNOSIS — T8203XA Leakage of heart valve prosthesis, initial encounter: Secondary | ICD-10-CM | POA: Diagnosis present

## 2019-04-25 DIAGNOSIS — I313 Pericardial effusion (noninflammatory): Secondary | ICD-10-CM | POA: Diagnosis present

## 2019-04-25 DIAGNOSIS — J9621 Acute and chronic respiratory failure with hypoxia: Secondary | ICD-10-CM | POA: Diagnosis present

## 2019-04-25 DIAGNOSIS — Z20828 Contact with and (suspected) exposure to other viral communicable diseases: Secondary | ICD-10-CM | POA: Diagnosis not present

## 2019-04-25 DIAGNOSIS — Y712 Prosthetic and other implants, materials and accessory cardiovascular devices associated with adverse incidents: Secondary | ICD-10-CM | POA: Diagnosis present

## 2019-04-25 DIAGNOSIS — S2241XA Multiple fractures of ribs, right side, initial encounter for closed fracture: Secondary | ICD-10-CM

## 2019-04-25 DIAGNOSIS — Y831 Surgical operation with implant of artificial internal device as the cause of abnormal reaction of the patient, or of later complication, without mention of misadventure at the time of the procedure: Secondary | ICD-10-CM | POA: Diagnosis present

## 2019-04-25 DIAGNOSIS — J8410 Pulmonary fibrosis, unspecified: Secondary | ICD-10-CM | POA: Diagnosis present

## 2019-04-25 DIAGNOSIS — S270XXA Traumatic pneumothorax, initial encounter: Principal | ICD-10-CM

## 2019-04-25 DIAGNOSIS — R55 Syncope and collapse: Secondary | ICD-10-CM | POA: Diagnosis not present

## 2019-04-25 DIAGNOSIS — I5041 Acute combined systolic (congestive) and diastolic (congestive) heart failure: Secondary | ICD-10-CM | POA: Diagnosis present

## 2019-04-25 DIAGNOSIS — I361 Nonrheumatic tricuspid (valve) insufficiency: Secondary | ICD-10-CM | POA: Diagnosis not present

## 2019-04-25 DIAGNOSIS — I872 Venous insufficiency (chronic) (peripheral): Secondary | ICD-10-CM | POA: Diagnosis present

## 2019-04-25 DIAGNOSIS — J9383 Other pneumothorax: Secondary | ICD-10-CM | POA: Diagnosis not present

## 2019-04-25 DIAGNOSIS — W01190A Fall on same level from slipping, tripping and stumbling with subsequent striking against furniture, initial encounter: Secondary | ICD-10-CM | POA: Diagnosis present

## 2019-04-25 DIAGNOSIS — Z6821 Body mass index (BMI) 21.0-21.9, adult: Secondary | ICD-10-CM | POA: Diagnosis not present

## 2019-04-25 DIAGNOSIS — K8689 Other specified diseases of pancreas: Secondary | ICD-10-CM | POA: Diagnosis present

## 2019-04-25 DIAGNOSIS — R64 Cachexia: Secondary | ICD-10-CM | POA: Diagnosis present

## 2019-04-25 DIAGNOSIS — I429 Cardiomyopathy, unspecified: Secondary | ICD-10-CM | POA: Diagnosis present

## 2019-04-25 DIAGNOSIS — E2740 Unspecified adrenocortical insufficiency: Secondary | ICD-10-CM

## 2019-04-25 DIAGNOSIS — J449 Chronic obstructive pulmonary disease, unspecified: Secondary | ICD-10-CM

## 2019-04-25 DIAGNOSIS — D696 Thrombocytopenia, unspecified: Secondary | ICD-10-CM | POA: Diagnosis not present

## 2019-04-25 DIAGNOSIS — I340 Nonrheumatic mitral (valve) insufficiency: Secondary | ICD-10-CM | POA: Diagnosis not present

## 2019-04-25 DIAGNOSIS — M069 Rheumatoid arthritis, unspecified: Secondary | ICD-10-CM

## 2019-04-25 DIAGNOSIS — I472 Ventricular tachycardia: Secondary | ICD-10-CM | POA: Diagnosis not present

## 2019-04-25 DIAGNOSIS — Y92003 Bedroom of unspecified non-institutional (private) residence as the place of occurrence of the external cause: Secondary | ICD-10-CM | POA: Diagnosis not present

## 2019-04-25 DIAGNOSIS — R296 Repeated falls: Secondary | ICD-10-CM | POA: Diagnosis present

## 2019-04-25 DIAGNOSIS — F329 Major depressive disorder, single episode, unspecified: Secondary | ICD-10-CM | POA: Diagnosis present

## 2019-04-25 DIAGNOSIS — E271 Primary adrenocortical insufficiency: Secondary | ICD-10-CM | POA: Diagnosis present

## 2019-04-25 DIAGNOSIS — E11649 Type 2 diabetes mellitus with hypoglycemia without coma: Secondary | ICD-10-CM | POA: Diagnosis not present

## 2019-04-25 DIAGNOSIS — Y92009 Unspecified place in unspecified non-institutional (private) residence as the place of occurrence of the external cause: Secondary | ICD-10-CM

## 2019-04-25 DIAGNOSIS — E119 Type 2 diabetes mellitus without complications: Secondary | ICD-10-CM

## 2019-04-25 DIAGNOSIS — I951 Orthostatic hypotension: Secondary | ICD-10-CM | POA: Diagnosis present

## 2019-04-25 DIAGNOSIS — M810 Age-related osteoporosis without current pathological fracture: Secondary | ICD-10-CM | POA: Diagnosis present

## 2019-04-25 DIAGNOSIS — F419 Anxiety disorder, unspecified: Secondary | ICD-10-CM | POA: Diagnosis not present

## 2019-04-25 DIAGNOSIS — J939 Pneumothorax, unspecified: Secondary | ICD-10-CM | POA: Diagnosis present

## 2019-04-25 DIAGNOSIS — M0689 Other specified rheumatoid arthritis, multiple sites: Secondary | ICD-10-CM | POA: Diagnosis present

## 2019-04-25 LAB — BASIC METABOLIC PANEL
Anion gap: 9 (ref 5–15)
BUN: 21 mg/dL (ref 8–23)
CO2: 27 mmol/L (ref 22–32)
Calcium: 9 mg/dL (ref 8.9–10.3)
Chloride: 107 mmol/L (ref 98–111)
Creatinine, Ser: 0.73 mg/dL (ref 0.44–1.00)
GFR calc Af Amer: >60 mL/min (ref >60)
GFR calc non Af Amer: >60 mL/min (ref >60)
Glucose, Bld: 108 mg/dL — ABNORMAL HIGH (ref 70–99)
Potassium: 4.1 mmol/L (ref 3.5–5.1)
Sodium: 143 mmol/L (ref 135–145)

## 2019-04-25 LAB — COMPREHENSIVE METABOLIC PANEL
ALT: 13 U/L (ref 0–44)
AST: 16 U/L (ref 15–41)
Albumin: 3.2 g/dL — ABNORMAL LOW (ref 3.5–5.0)
Alkaline Phosphatase: 61 U/L (ref 38–126)
Anion gap: 9 (ref 5–15)
BUN: 13 mg/dL (ref 8–23)
CO2: 29 mmol/L (ref 22–32)
Calcium: 9.2 mg/dL (ref 8.9–10.3)
Chloride: 104 mmol/L (ref 98–111)
Creatinine, Ser: 0.7 mg/dL (ref 0.44–1.00)
GFR calc Af Amer: >60 mL/min (ref >60)
GFR calc non Af Amer: >60 mL/min (ref >60)
Glucose, Bld: 112 mg/dL — ABNORMAL HIGH (ref 70–99)
Potassium: 3.6 mmol/L (ref 3.5–5.1)
Sodium: 142 mmol/L (ref 135–145)
Total Bilirubin: 0.8 mg/dL (ref 0.3–1.2)
Total Protein: 5.7 g/dL — ABNORMAL LOW (ref 6.5–8.1)

## 2019-04-25 LAB — CBC
HCT: 42.2 % (ref 36.0–46.0)
Hemoglobin: 13.3 g/dL (ref 12.0–15.0)
MCH: 30.8 pg (ref 26.0–34.0)
MCHC: 31.5 g/dL (ref 30.0–36.0)
MCV: 97.7 fL (ref 80.0–100.0)
Platelets: 148 10*3/uL — ABNORMAL LOW (ref 150–400)
RBC: 4.32 MIL/uL (ref 3.87–5.11)
RDW: 13.7 % (ref 11.5–15.5)
WBC: 9.8 10*3/uL (ref 4.0–10.5)
nRBC: 0 % (ref 0.0–0.2)

## 2019-04-25 LAB — TROPONIN I (HIGH SENSITIVITY)
Troponin I (High Sensitivity): 50 ng/L — ABNORMAL HIGH (ref <18)
Troponin I (High Sensitivity): 131 ng/L (ref <18)
Troponin I (High Sensitivity): 134 ng/L (ref <18)

## 2019-04-25 LAB — GLUCOSE, CAPILLARY
Glucose-Capillary: 98 mg/dL (ref 70–99)
Glucose-Capillary: 113 mg/dL — ABNORMAL HIGH (ref 70–99)
Glucose-Capillary: 168 mg/dL — ABNORMAL HIGH (ref 70–99)
Glucose-Capillary: 88 mg/dL (ref 70–99)

## 2019-04-25 LAB — SARS CORONAVIRUS 2 BY RT PCR (HOSPITAL ORDER, PERFORMED IN ~~LOC~~ HOSPITAL LAB): SARS Coronavirus 2: NEGATIVE

## 2019-04-25 MED ORDER — ACETAMINOPHEN 500 MG PO TABS
500.0000 mg | ORAL_TABLET | Freq: Four times a day (QID) | ORAL | Status: DC | PRN
  Administered 2019-04-25 (×2): 500 mg via ORAL
  Filled 2019-04-25 (×2): qty 1

## 2019-04-25 MED ORDER — ALPRAZOLAM 0.5 MG PO TABS
0.5000 mg | ORAL_TABLET | Freq: Two times a day (BID) | ORAL | Status: DC
  Administered 2019-04-25 – 2019-05-01 (×13): 0.5 mg via ORAL
  Filled 2019-04-25 (×13): qty 1

## 2019-04-25 MED ORDER — LIDOCAINE 5 % EX PTCH
2.0000 | MEDICATED_PATCH | CUTANEOUS | Status: DC
  Administered 2019-04-25 – 2019-05-01 (×8): 2 via TRANSDERMAL
  Filled 2019-04-25 (×9): qty 2

## 2019-04-25 MED ORDER — LEFLUNOMIDE 20 MG PO TABS
20.0000 mg | ORAL_TABLET | Freq: Every day | ORAL | Status: DC
  Administered 2019-04-25 – 2019-05-01 (×7): 20 mg via ORAL
  Filled 2019-04-25 (×7): qty 1

## 2019-04-25 MED ORDER — KETOROLAC TROMETHAMINE 15 MG/ML IJ SOLN
15.0000 mg | Freq: Four times a day (QID) | INTRAMUSCULAR | Status: DC | PRN
  Administered 2019-04-25: 15 mg via INTRAVENOUS
  Filled 2019-04-25: qty 1

## 2019-04-25 MED ORDER — NORTRIPTYLINE HCL 25 MG PO CAPS
25.0000 mg | ORAL_CAPSULE | Freq: Every day | ORAL | Status: DC
  Administered 2019-04-25 – 2019-04-30 (×5): 25 mg via ORAL
  Filled 2019-04-25 (×7): qty 1

## 2019-04-25 MED ORDER — INSULIN ASPART 100 UNIT/ML ~~LOC~~ SOLN: 0.0000 [IU] | Freq: Every day | SUBCUTANEOUS | Status: DC

## 2019-04-25 MED ORDER — BUPROPION HCL 100 MG PO TABS
100.0000 mg | ORAL_TABLET | Freq: Every morning | ORAL | Status: DC
  Administered 2019-04-25 – 2019-05-01 (×7): 100 mg via ORAL
  Filled 2019-04-25 (×7): qty 1

## 2019-04-25 MED ORDER — LATANOPROST 0.005 % OP SOLN
1.0000 [drp] | Freq: Every day | OPHTHALMIC | Status: DC
  Administered 2019-04-25 – 2019-04-30 (×6): 1 [drp] via OPHTHALMIC
  Filled 2019-04-25: qty 2.5

## 2019-04-25 MED ORDER — PREDNISONE 5 MG PO TABS
5.0000 mg | ORAL_TABLET | Freq: Two times a day (BID) | ORAL | Status: DC
  Administered 2019-04-25 – 2019-05-01 (×12): 5 mg via ORAL
  Filled 2019-04-25 (×13): qty 1

## 2019-04-25 MED ORDER — MIRTAZAPINE 15 MG PO TABS: 30.0000 mg | ORAL_TABLET | Freq: Every day | ORAL | Status: DC

## 2019-04-25 MED ORDER — FENTANYL CITRATE (PF) 100 MCG/2ML IJ SOLN
50.0000 ug | Freq: Once | INTRAMUSCULAR | Status: DC
  Filled 2019-04-25: qty 2

## 2019-04-25 MED ORDER — VITAMIN D 25 MCG (1000 UNIT) PO TABS
1000.0000 [IU] | ORAL_TABLET | Freq: Every day | ORAL | Status: DC
  Administered 2019-04-25 – 2019-05-01 (×7): 1000 [IU] via ORAL
  Filled 2019-04-25 (×7): qty 1

## 2019-04-25 MED ORDER — ONDANSETRON HCL 4 MG PO TABS
4.0000 mg | ORAL_TABLET | Freq: Four times a day (QID) | ORAL | Status: DC | PRN
  Administered 2019-04-26: 4 mg via ORAL
  Filled 2019-04-25: qty 1

## 2019-04-25 MED ORDER — MIRTAZAPINE 15 MG PO TABS
15.0000 mg | ORAL_TABLET | Freq: Every day | ORAL | Status: DC
  Administered 2019-04-25 – 2019-04-30 (×6): 15 mg via ORAL
  Filled 2019-04-25 (×6): qty 1

## 2019-04-25 MED ORDER — INSULIN ASPART 100 UNIT/ML ~~LOC~~ SOLN
0.0000 [IU] | Freq: Three times a day (TID) | SUBCUTANEOUS | Status: DC
  Administered 2019-04-25: 2 [IU] via SUBCUTANEOUS

## 2019-04-25 MED ORDER — HEPARIN SODIUM (PORCINE) 5000 UNIT/ML IJ SOLN
5000.0000 [IU] | Freq: Three times a day (TID) | INTRAMUSCULAR | Status: DC
  Administered 2019-04-25 – 2019-05-01 (×20): 5000 [IU] via SUBCUTANEOUS
  Filled 2019-04-25 (×20): qty 1

## 2019-04-25 MED ORDER — SODIUM CHLORIDE 0.9 % IV SOLN
INTRAVENOUS | Status: DC
  Administered 2019-04-25 (×2): via INTRAVENOUS

## 2019-04-25 MED ORDER — KETOROLAC TROMETHAMINE 30 MG/ML IJ SOLN
15.0000 mg | Freq: Once | INTRAMUSCULAR | Status: AC
  Administered 2019-04-25: 01:00:00 15 mg via INTRAVENOUS
  Filled 2019-04-25: qty 1

## 2019-04-25 MED ORDER — FENTANYL CITRATE (PF) 100 MCG/2ML IJ SOLN
50.0000 ug | Freq: Once | INTRAMUSCULAR | Status: AC
  Administered 2019-04-25: 01:00:00 50 ug via INTRAVENOUS
  Filled 2019-04-25: qty 2

## 2019-04-25 MED ORDER — BISOPROLOL FUMARATE 5 MG PO TABS
2.5000 mg | ORAL_TABLET | Freq: Every day | ORAL | Status: DC
  Administered 2019-04-25 – 2019-04-26 (×2): 2.5 mg via ORAL
  Filled 2019-04-25 (×2): qty 1

## 2019-04-25 MED ORDER — ONDANSETRON HCL 4 MG/2ML IJ SOLN
4.0000 mg | Freq: Four times a day (QID) | INTRAMUSCULAR | Status: DC | PRN
  Administered 2019-04-30: 4 mg via INTRAVENOUS
  Filled 2019-04-25: qty 2

## 2019-04-25 MED ORDER — VITAMIN B-12 1000 MCG PO TABS
1000.0000 ug | ORAL_TABLET | Freq: Every day | ORAL | Status: DC
  Administered 2019-04-25 – 2019-05-01 (×7): 1000 ug via ORAL
  Filled 2019-04-25 (×7): qty 1

## 2019-04-25 NOTE — Progress Notes (Signed)
Update given to daughter, [name], over the phone. Room phone placed within reach of pt for [name] to call her.

## 2019-04-25 NOTE — ED Notes (Signed)
Paperwork printed and [facility] contacted 

## 2019-04-25 NOTE — Progress Notes (Signed)
Admission from earlier this morning with ground-level fall at home and subsequent right seventh and eighth rib fracture and small apical pneumothorax.  Unclear cause of a fall.  Denies hitting her head or loss of consciousness. Reports recurrent fall.  She says she falls when she turns her neck.  She is on multiple sedating medication including Xanax, high-dose Remeron and nortriptyline.  She lives alone.  Daughter lives close by.  Uses Rollator and walker for ambulation.  EKG with sinus arrhythmia/mild tachycardia and few PVCs with abnormal ST which seems to be old.  High-sensitivity troponin is elevated to 50.  It was not repeated.  Currently stable.  Pain fairly controlled.  Denies cardiopulmonary or focal neuro symptoms.  Repeat CXR with a stable pneumothorax.  General surgery following.   -Plan is conservative management with pain control, IS and repeat chest x-ray in the morning.  -I have ordered another high-sensitivity troponin, echocardiogram and carotid ultrasound. -Suggest weaning of Xanax and reducing home Remeron and nortriptyline. -Follow PT OT recommendations  See H&P from this morning for more

## 2019-04-25 NOTE — Progress Notes (Signed)
CRITICAL VALUE ALERT  Critical Value:  134 High sensitivity troponin  Date & Time Notied: 04/25/19 7:34pm  Provider Notified: [place]   Orders Received/Actions taken: no further orders at this time

## 2019-04-25 NOTE — Progress Notes (Signed)
VASCULAR LAB PRELIMINARY  PRELIMINARY  PRELIMINARY  PRELIMINARY  Carotid duplex completed.    Preliminary report:  See CV proc for preliminary results.   Mayank Teuscher, RVT 04/25/2019, 6:27 PM

## 2019-04-25 NOTE — ED Notes (Signed)
ED TO INPATIENT HANDOFF REPORT  Name/Age/Gender [name] 84 y.o. female  Code Status Code Status History    Date Active Date Inactive Code Status Order ID Comments User Context   11/20/2017 2058 11/24/2017 2309 Full Code [id number]  Eduard ClosKakrakandy, Arshad N, MD ED   10/30/2017 2205 11/03/2017 2243 Full Code [id number]  Hillary BowGardner, Jared M, DO ED   10/19/2017 0336 10/22/2017 2004 Full Code [id number]  Hillary BowGardner, Jared M, DO ED   10/20/2015 2219 10/23/2015 1713 Full Code [id number]  Bobette Mortiz, David Manuel, MD Inpatient   03/17/2015 2358 03/22/2015 2125 Full Code [id number]  Dorothea OgleMyers, Iskra M, MD Inpatient   09/09/2014 2144 09/16/2014 1404 Full Code [id number]  Gwen Poundsusso, John M, MD ED   11/09/2013 2037 11/17/2013 1940 Full Code [id number]  Doree AlbeeNewton, Steven, MD ED   09/16/2013 0137 09/17/2013 1849 Full Code [id number]  Ezequiel KayserPerini, Mark A, MD Inpatient   11/12/2011 0839 11/18/2011 1741 Full Code [id number]  Jarome MatinPaterson, Daniel, MD ED   10/01/2011 1857 10/14/2011 1315 Full Code [id number]  Gwen Poundsusso, John M, MD ED   Advance Care Planning Activity    Advance Directive Documentation     Most Recent Value  Type of Advance Directive  Healthcare Power of Attorney  Pre-existing out of facility DNR order (yellow form or pink MOST form)  -  "MOST" Form in Place?  -      Home/SNF/Other Home  Chief Complaint Right Rib Pain due to Fall  Level of Care/Admitting Diagnosis ED Disposition    ED Disposition Condition Comment   Admit  [facility] [[id number][facility]  Level of Care: Progressive [102]  Covid Evaluation: Confirmed COVID Negative  Diagnosis: Pneumothorax [606301][[id number]]  Admitting Physician: Rometta EmeryGARBA, MOHAMMAD L [[id number]]  Attending Physician: Rometta EmeryGARBA, MOHAMMAD L [[id number]]  Estimated length of stay: past midnight tomorrow  Certification:: I certify this patient will need inpatient services for at least 2 midnights  PT Class (Do Not Modify): Inpatient [101]  PT Acc Code (Do Not Modify): Private [1]       Medical  History Past Medical History:  Diagnosis Date  . Adrenal insufficiency, primary, iatrogenic   . Anxiety   . Aortic stenosis, severe 02/26/2013  . Chest pain    myoview 04/13/04-inferoapical thinning with a suggestion of mild apical ischemia, EF 66%  . Depression 03/25/2013  . Gait instability   . Hearing loss 03/25/2013  . Memory deficit 03/25/2013  . Murmur, cardiac   . Osteoporosis   . Palpitations   . Pancreatic insufficiency 11/09/2012  . Physical deconditioning   . Pneumonia   . Pulmonary fibrosis (HCC) 03/25/2013  . Rheumatoid arthritis(714.0)   . SOBOE (shortness of breath on exertion)    echo 02/23/13-EF 55-60%, possible bicuspid aortic valve with mod to severed stenosis   . Syncope   . UTI (lower urinary tract infection) 10/01/2011    Allergies Allergies  Allergen Reactions  . Penicillins Hives    Has patient had a PCN reaction causing immediate rash, facial/tongue/throat swelling, SOB or lightheadedness with hypotension: No Has patient had a PCN reaction causing severe rash involving mucus membranes or skin necrosis: No Has patient had a PCN reaction that required hospitalization No Has patient had a PCN reaction occurring within the last 10 years: No If all of the above answers are "NO", then may proceed with Cephalosporin use.   . Tape Other (See Comments)    Skin peels up.  Skin peels up.     IV Location/Drains/Wounds Patient Lines/Drains/Airways Status  Active Line/Drains/Airways    Name:   Placement date:   Placement time:   Site:   Days:   Peripheral IV Anterior;Left Forearm   -    -    Forearm      External Urinary Catheter   04/25/19    0013    -   less than 1          Labs/Imaging Results for orders placed or performed during the hospital encounter of 04/24/19 (from the past 48 hour(s))  CBC with Differential/Platelet     Status: Abnormal   Collection Time: 04/24/19 11:09 PM  Result Value Ref Range   WBC 10.3 4.0 - 10.5 K/uL   RBC 3.85 (L) 3.87 - 5.11  MIL/uL   Hemoglobin 11.8 (L) 12.0 - 15.0 g/dL   HCT 38.1 01.7 - 51.0 %   MCV 99.0 80.0 - 100.0 fL   MCH 30.6 26.0 - 34.0 pg   MCHC 31.0 30.0 - 36.0 g/dL   RDW 25.8 52.7 - 78.2 %   Platelets 153 150 - 400 K/uL   nRBC 0.0 0.0 - 0.2 %   Neutrophils Relative % 70 %   Neutro Abs 7.3 1.7 - 7.7 K/uL   Lymphocytes Relative 17 %   Lymphs Abs 1.7 0.7 - 4.0 K/uL   Monocytes Relative 9 %   Monocytes Absolute 0.9 0.1 - 1.0 K/uL   Eosinophils Relative 2 %   Eosinophils Absolute 0.2 0.0 - 0.5 K/uL   Basophils Relative 1 %   Basophils Absolute 0.1 0.0 - 0.1 K/uL   Immature Granulocytes 1 %   Abs Immature Granulocytes 0.13 (H) 0.00 - 0.07 K/uL    Comment: Performed at [facility], [address], [place], [place] [address]  Basic metabolic panel     Status: Abnormal   Collection Time: 04/24/19 11:09 PM  Result Value Ref Range   Sodium 143 135 - 145 mmol/L   Potassium 4.1 3.5 - 5.1 mmol/L   Chloride 107 98 - 111 mmol/L   CO2 27 22 - 32 mmol/L   Glucose, Bld 108 (H) 70 - 99 mg/dL   BUN 21 8 - 23 mg/dL   Creatinine, Ser 6.14 0.44 - 1.00 mg/dL   Calcium 9.0 8.9 - 43.1 mg/dL   GFR calc non Af Amer >60 >60 mL/min   GFR calc Af Amer >60 >60 mL/min   Anion gap 9 5 - 15    Comment: Performed at [facility], [address] [place], [place] [address]  Troponin I (High Sensitivity)     Status: Abnormal   Collection Time: 04/24/19 11:09 PM  Result Value Ref Range   Troponin I (High Sensitivity) 50 (H) <18 ng/L    Comment: (NOTE) Elevated high sensitivity troponin I (hsTnI) values and significant  changes across serial measurements may suggest ACS but many other  chronic and acute conditions are known to elevate hsTnI results.  Refer to the "Links" section for chest pain algorithms and additional  guidance. Performed at [facility], [address] [place], [place] [address]   SARS Coronavirus 2 (CEPHEID- Performed in [facility]  hospital lab), Hosp Order     Status: None   Collection Time: 04/25/19  1:33 AM   Specimen: Nasopharyngeal Swab  Result Value Ref Range   SARS Coronavirus 2 NEGATIVE NEGATIVE    Comment: (NOTE) If result is NEGATIVE SARS-CoV-2 target nucleic acids are NOT DETECTED. The SARS-CoV-2 RNA is generally detectable  in upper and lower  respiratory specimens during the acute phase of infection. The lowest  concentration of SARS-CoV-2 viral copies this assay can detect is 250  copies / mL. A negative result does not preclude SARS-CoV-2 infection  and should not be used as the sole basis for treatment or other  patient management decisions.  A negative result may occur with  improper specimen collection / handling, submission of specimen other  than nasopharyngeal swab, presence of viral mutation(s) within the  areas targeted by this assay, and inadequate number of viral copies  (<250 copies / mL). A negative result must be combined with clinical  observations, patient history, and epidemiological information. If result is POSITIVE SARS-CoV-2 target nucleic acids are DETECTED. The SARS-CoV-2 RNA is generally detectable in upper and lower  respiratory specimens dur ing the acute phase of infection.  Positive  results are indicative of active infection with SARS-CoV-2.  Clinical  correlation with patient history and other diagnostic information is  necessary to determine patient infection status.  Positive results do  not rule out bacterial infection or co-infection with other viruses. If result is PRESUMPTIVE POSTIVE SARS-CoV-2 nucleic acids MAY BE PRESENT.   A presumptive positive result was obtained on the submitted specimen  and confirmed on repeat testing.  While 2019 novel coronavirus  (SARS-CoV-2) nucleic acids may be present in the submitted sample  additional confirmatory testing may be necessary for epidemiological  and / or clinical management purposes  to differentiate between   SARS-CoV-2 and other Sarbecovirus currently known to infect humans.  If clinically indicated additional testing with an alternate test  methodology [phone]) is advised. The SARS-CoV-2 RNA is generally  detectable in upper and lower respiratory sp ecimens during the acute  phase of infection. The expected result is Negative. Fact Sheet for Patients:  BoilerBrush.com.cy Fact Sheet for Healthcare Providers: https://pope.com/ This test is not yet approved or cleared by the [place] FDA and has been authorized for detection and/or diagnosis of SARS-CoV-2 by FDA under an Emergency Use Authorization (EUA).  This EUA will remain in effect (meaning this test can be used) for the duration of the COVID-19 declaration under Section 564(b)(1) of the Act, 21 U.S.C. section 360bbb-3(b)(1), unless the authorization is terminated or revoked sooner. Performed at [facility], [address], [place], [place] [address]    Ct Head Wo Contrast  Result Date: 04/25/2019 CLINICAL DATA:  Fall landing on right side. EXAM: CT HEAD WITHOUT CONTRAST CT CERVICAL SPINE WITHOUT CONTRAST TECHNIQUE: Multidetector CT imaging of the head and cervical spine was performed following the standard protocol without intravenous contrast. Multiplanar CT image reconstructions of the cervical spine were also generated. COMPARISON:  Head CT 10/30/2017, head and cervical spine CT 10/14/2015 FINDINGS: CT HEAD FINDINGS Brain: No intracranial hemorrhage, mass effect, or midline shift. Stable degree of atrophy and chronic small vessel ischemia from prior exam. Small remote occipital infarct. No hydrocephalus. The basilar cisterns are patent. No evidence of territorial infarct or acute ischemia. No extra-axial or intracranial fluid collection. Vascular: Atherosclerosis of skullbase vasculature without hyperdense vessel or abnormal calcification. Skull: No fracture or focal  lesion. Sinuses/Orbits: Paranasal sinuses and mastoid air cells are clear. The visualized orbits are unremarkable. Bilateral cataract resection. Other: None. CT CERVICAL SPINE FINDINGS Mild motion artifact. Alignment: Exaggerated cervical lordosis. No traumatic subluxation. Unchanged mild retrolisthesis of C3 on C4, degenerative. Skull base and vertebrae: No acute fracture. Dens and skull base are intact. None fusion posterior elements of C2,  congenital. Slight loss of height of T1 vertebral body is chronic and unchanged. Soft tissues and spinal canal: No prevertebral fluid or swelling. No visible canal hematoma. Disc levels: Multilevel degenerative disc disease most prominent at C4-C5. Multilevel facet hypertrophy. Upper chest: Small right apically pneumothorax as seen on chest radiograph earlier this day. Other: Carotid calcifications. IMPRESSION: 1. No acute intracranial abnormality. No skull fracture. Stable atrophy and chronic small vessel ischemia. 2. Multilevel degenerative change in the cervical spine without acute fracture or subluxation. 3. Small right apical pneumothorax as seen on chest radiograph earlier this day. Electronically Signed   By: Narda Rutherford M.D.   On: 04/25/2019 01:42   Ct Cervical Spine Wo Contrast  Result Date: 04/25/2019 CLINICAL DATA:  Fall landing on right side. EXAM: CT HEAD WITHOUT CONTRAST CT CERVICAL SPINE WITHOUT CONTRAST TECHNIQUE: Multidetector CT imaging of the head and cervical spine was performed following the standard protocol without intravenous contrast. Multiplanar CT image reconstructions of the cervical spine were also generated. COMPARISON:  Head CT 10/30/2017, head and cervical spine CT 10/14/2015 FINDINGS: CT HEAD FINDINGS Brain: No intracranial hemorrhage, mass effect, or midline shift. Stable degree of atrophy and chronic small vessel ischemia from prior exam. Small remote occipital infarct. No hydrocephalus. The basilar cisterns are patent. No evidence of  territorial infarct or acute ischemia. No extra-axial or intracranial fluid collection. Vascular: Atherosclerosis of skullbase vasculature without hyperdense vessel or abnormal calcification. Skull: No fracture or focal lesion. Sinuses/Orbits: Paranasal sinuses and mastoid air cells are clear. The visualized orbits are unremarkable. Bilateral cataract resection. Other: None. CT CERVICAL SPINE FINDINGS Mild motion artifact. Alignment: Exaggerated cervical lordosis. No traumatic subluxation. Unchanged mild retrolisthesis of C3 on C4, degenerative. Skull base and vertebrae: No acute fracture. Dens and skull base are intact. None fusion posterior elements of C2, congenital. Slight loss of height of T1 vertebral body is chronic and unchanged. Soft tissues and spinal canal: No prevertebral fluid or swelling. No visible canal hematoma. Disc levels: Multilevel degenerative disc disease most prominent at C4-C5. Multilevel facet hypertrophy. Upper chest: Small right apically pneumothorax as seen on chest radiograph earlier this day. Other: Carotid calcifications. IMPRESSION: 1. No acute intracranial abnormality. No skull fracture. Stable atrophy and chronic small vessel ischemia. 2. Multilevel degenerative change in the cervical spine without acute fracture or subluxation. 3. Small right apical pneumothorax as seen on chest radiograph earlier this day. Electronically Signed   By: Narda Rutherford M.D.   On: 04/25/2019 01:42   Dg Chest Portable 1 View  Result Date: 04/25/2019 CLINICAL DATA:  Fall diminished lung sounds on the right side EXAM: PORTABLE CHEST 1 VIEW COMPARISON:  11/20/2017, 10/30/2017 FINDINGS: Hazy opacity at the right lung base. Possible focal airspace disease or tiny effusion at the left base. Borderline cardiomegaly. Aortic graft similar in position. Aortic atherosclerosis. Small right apical pneumothorax, demonstrating 12 mm pleural-parenchymal separation at the apex, estimated at 10%. Acute right  seventh displaced rib fracture. Possible nondisplaced right ninth rib fracture. Negative for midline shift. IMPRESSION: 1. Small right apical pneumothorax, estimated at 10% 2. Acute displaced right seventh rib fracture and possible nondisplaced right ninth rib fracture Critical Value/emergent results were called by telephone at the time of interpretation on 04/25/2019 at 12:38 am to Dr. Cy Blamer , who verbally acknowledged these results. Electronically Signed   By: Jasmine Pang M.D.   On: 04/25/2019 00:38    Pending Labs [employer] (From admission, onward)    Start     Ordered  04/24/19 2309  Troponin I (High Sensitivity)  STAT Now then every 2 hours,   R (with STAT occurrences)    Question:  Indication  Answer:  Other   04/24/19 2308   Signed and Held  CBC  (heparin)  Once,   R    Comments: Baseline for heparin therapy IF NOT ALREADY DRAWN.  Notify MD if PLT < 100 K.    Signed and Held   Signed and Held  Creatinine, serum  (heparin)  Once,   R    Comments: Baseline for heparin therapy IF NOT ALREADY DRAWN.    Signed and Held   Signed and Held  Comprehensive metabolic panel  Tomorrow morning,   R     Signed and Held   Signed and Held  CBC  Tomorrow morning,   R     Signed and Held          Vitals/Pain Today's Vitals   04/24/19 2318 04/24/19 2319 04/25/19 0030 04/25/19 0154  BP:   (!) 155/69 (!) 151/63  Pulse:   95 90  Resp:   18 13  Temp: 97.6 F (36.4 C) 98.6 F (37 C)    TempSrc: Oral Oral    SpO2: 97%  99% 100%  PainSc:        Isolation Precautions Airborne and Contact precautions  Medications Medications  lidocaine (LIDODERM) 5 % 2 patch (2 patches Transdermal Patch Applied 04/25/19 0123)  fentaNYL (SUBLIMAZE) injection 50 mcg (50 mcg Intravenous Given 04/25/19 0126)  ketorolac (TORADOL) 30 MG/ML injection 15 mg (15 mg Intravenous Given 04/25/19 0128)    Mobility

## 2019-04-25 NOTE — Consult Note (Signed)
Reason for Consult:fall, pneumothorax Referring Physician: dr Caron Presumepalumbo  [name] is an 84 y.o. female.  HPI: 84 year old female was at home last evening when she had a ground-level fall.  She denies loss of consciousness or hitting her head.  She could not get up so she called EMS and was taken to [facility] for evaluation.  She states that she does have a tendency to fall at times.  Generally her neighbor is able to help her up.  But she had some right-sided chest pain after falling which made it more difficult.  She has a history of pulmonary fibrosis.  She states that she is not on home oxygen.  She states that she wraps her lower legs for circulation reasons.  She denies any current shortness of breath at rest.  She denies any head pain, vision changes, abdominal pain or extremity pain.  Past Medical History:  Diagnosis Date   Adrenal insufficiency, primary, iatrogenic    Anxiety    Aortic stenosis, severe 02/26/2013   Chest pain    myoview 04/13/04-inferoapical thinning with a suggestion of mild apical ischemia, EF 66%   Depression 03/25/2013   Gait instability    Hearing loss 03/25/2013   Memory deficit 03/25/2013   Murmur, cardiac    Osteoporosis    Palpitations    Pancreatic insufficiency 11/09/2012   Physical deconditioning    Pneumonia    Pulmonary fibrosis (HCC) 03/25/2013   Rheumatoid arthritis(714.0)    SOBOE (shortness of breath on exertion)    echo 02/23/13-EF 55-60%, possible bicuspid aortic valve with mod to severed stenosis    Syncope    UTI (lower urinary tract infection) 10/01/2011    Past Surgical History:  Procedure Laterality Date   ABDOMINAL HYSTERECTOMY     CARDIAC CATHETERIZATION  11/08/96   no evidence of CAD, nl LV function   CHOLECYSTECTOMY     CORONARY ANGIOGRAM  03/22/2013   Procedure: CORONARY ANGIOGRAM;  Surgeon: Chrystie NoseKenneth C. Hilty, MD;  [facility];  Service: Cardiovascular;;   KNEE ARTHROSCOPY     2 on left   RIGHT  HEART CATHETERIZATION  03/22/2013   Procedure: RIGHT HEART CATH;  Surgeon: Chrystie NoseKenneth C. Hilty, MD;  [facility];  Service: Cardiovascular;;   SHOULDER SURGERY Left    THYROIDECTOMY      Family History  Problem Relation Age of Onset   Cancer - Colon Father    Stroke Brother    Cancer Sister        spine?    Social History:  reports that she has never smoked. She has never used smokeless tobacco. She reports that she does not drink alcohol or use drugs.  Allergies:  Allergies  Allergen Reactions   Penicillins Hives    Has patient had a PCN reaction causing immediate rash, facial/tongue/throat swelling, SOB or lightheadedness with hypotension: No Has patient had a PCN reaction causing severe rash involving mucus membranes or skin necrosis: No Has patient had a PCN reaction that required hospitalization No Has patient had a PCN reaction occurring within the last 10 years: No If all of the above answers are "NO", then may proceed with Cephalosporin use.    Tape Other (See Comments)    Skin peels up.  Skin peels up.     Medications: I have reviewed the patient's current medications.  Results for orders placed or performed during the hospital encounter of 04/24/19 (from the past 48 hour(s))  CBC with Differential/Platelet  Status: Abnormal   Collection Time: 04/24/19 11:09 PM  Result Value Ref Range   WBC 10.3 4.0 - 10.5 K/uL   RBC 3.85 (L) 3.87 - 5.11 MIL/uL   Hemoglobin 11.8 (L) 12.0 - 15.0 g/dL   HCT 88.5 02.7 - 74.1 %   MCV 99.0 80.0 - 100.0 fL   MCH 30.6 26.0 - 34.0 pg   MCHC 31.0 30.0 - 36.0 g/dL   RDW 28.7 86.7 - 67.2 %   Platelets 153 150 - 400 K/uL   nRBC 0.0 0.0 - 0.2 %   Neutrophils Relative % 70 %   Neutro Abs 7.3 1.7 - 7.7 K/uL   Lymphocytes Relative 17 %   Lymphs Abs 1.7 0.7 - 4.0 K/uL   Monocytes Relative 9 %   Monocytes Absolute 0.9 0.1 - 1.0 K/uL   Eosinophils Relative 2 %   Eosinophils Absolute 0.2 0.0 - 0.5 K/uL   Basophils Relative 1  %   Basophils Absolute 0.1 0.0 - 0.1 K/uL   Immature Granulocytes 1 %   Abs Immature Granulocytes 0.13 (H) 0.00 - 0.07 K/uL    Comment: Performed at [facility], [address], [place], [place] [address]  Basic metabolic panel     Status: Abnormal   Collection Time: 04/24/19 11:09 PM  Result Value Ref Range   Sodium 143 135 - 145 mmol/L   Potassium 4.1 3.5 - 5.1 mmol/L   Chloride 107 98 - 111 mmol/L   CO2 27 22 - 32 mmol/L   Glucose, Bld 108 (H) 70 - 99 mg/dL   BUN 21 8 - 23 mg/dL   Creatinine, Ser 9.62 0.44 - 1.00 mg/dL   Calcium 9.0 8.9 - 83.6 mg/dL   GFR calc non Af Amer >60 >60 mL/min   GFR calc Af Amer >60 >60 mL/min   Anion gap 9 5 - 15    Comment: Performed at [facility], [address], [place], [place] [address]  Troponin I (High Sensitivity)     Status: Abnormal   Collection Time: 04/24/19 11:09 PM  Result Value Ref Range   Troponin I (High Sensitivity) 50 (H) <18 ng/L    Comment: (NOTE) Elevated high sensitivity troponin I (hsTnI) values and significant  changes across serial measurements may suggest ACS but many other  chronic and acute conditions are known to elevate hsTnI results.  Refer to the "Links" section for chest pain algorithms and additional  guidance. Performed at [facility], [address] [place], [place] [address]   SARS Coronavirus 2 (CEPHEID- Performed in [facility] hospital lab), Hosp Order     Status: None   Collection Time: 04/25/19  1:33 AM   Specimen: Nasopharyngeal Swab  Result Value Ref Range   SARS Coronavirus 2 NEGATIVE NEGATIVE    Comment: (NOTE) If result is NEGATIVE SARS-CoV-2 target nucleic acids are NOT DETECTED. The SARS-CoV-2 RNA is generally detectable in upper and lower  respiratory specimens during the acute phase of infection. The lowest  concentration of SARS-CoV-2 viral copies this assay can detect is 250  copies / mL. A negative result does not preclude  SARS-CoV-2 infection  and should not be used as the sole basis for treatment or other  patient management decisions.  A negative result may occur with  improper specimen collection / handling, submission of specimen other  than nasopharyngeal swab, presence of viral mutation(s) within the  areas targeted by this assay, and inadequate number of viral copies  (<  250 copies / mL). A negative result must be combined with clinical  observations, patient history, and epidemiological information. If result is POSITIVE SARS-CoV-2 target nucleic acids are DETECTED. The SARS-CoV-2 RNA is generally detectable in upper and lower  respiratory specimens dur ing the acute phase of infection.  Positive  results are indicative of active infection with SARS-CoV-2.  Clinical  correlation with patient history and other diagnostic information is  necessary to determine patient infection status.  Positive results do  not rule out bacterial infection or co-infection with other viruses. If result is PRESUMPTIVE POSTIVE SARS-CoV-2 nucleic acids MAY BE PRESENT.   A presumptive positive result was obtained on the submitted specimen  and confirmed on repeat testing.  While 2019 novel coronavirus  (SARS-CoV-2) nucleic acids may be present in the submitted sample  additional confirmatory testing may be necessary for epidemiological  and / or clinical management purposes  to differentiate between  SARS-CoV-2 and other Sarbecovirus currently known to infect humans.  If clinically indicated additional testing with an alternate test  methodology [phone]) is advised. The SARS-CoV-2 RNA is generally  detectable in upper and lower respiratory sp ecimens during the acute  phase of infection. The expected result is Negative. Fact Sheet for Patients:  StrictlyIdeas.no Fact Sheet for Healthcare Providers: BankingDealers.co.za This test is not yet approved or cleared by the  [place] FDA and has been authorized for detection and/or diagnosis of SARS-CoV-2 by FDA under an Emergency Use Authorization (EUA).  This EUA will remain in effect (meaning this test can be used) for the duration of the COVID-19 declaration under Section 564(b)(1) of the Act, 21 U.S.C. section 360bbb-3(b)(1), unless the authorization is terminated or revoked sooner. Performed at [facility], [place] [address] [place], [place] [address]   Glucose, capillary     Status: None   Collection Time: 04/25/19  8:05 AM  Result Value Ref Range   Glucose-Capillary 98 70 - 99 mg/dL  CBC     Status: Abnormal   Collection Time: 04/25/19  8:26 AM  Result Value Ref Range   WBC 9.8 4.0 - 10.5 K/uL   RBC 4.32 3.87 - 5.11 MIL/uL   Hemoglobin 13.3 12.0 - 15.0 g/dL   HCT 42.2 36.0 - 46.0 %   MCV 97.7 80.0 - 100.0 fL   MCH 30.8 26.0 - 34.0 pg   MCHC 31.5 30.0 - 36.0 g/dL   RDW 13.7 11.5 - 15.5 %   Platelets 148 (L) 150 - 400 K/uL   nRBC 0.0 0.0 - 0.2 %    Comment: Performed at [facility], [place] [address], [place], [place] [address]  Comprehensive metabolic panel     Status: Abnormal   Collection Time: 04/25/19  8:26 AM  Result Value Ref Range   Sodium 142 135 - 145 mmol/L   Potassium 3.6 3.5 - 5.1 mmol/L   Chloride 104 98 - 111 mmol/L   CO2 29 22 - 32 mmol/L   Glucose, Bld 112 (H) 70 - 99 mg/dL   BUN 13 8 - 23 mg/dL   Creatinine, Ser 0.70 0.44 - 1.00 mg/dL   Calcium 9.2 8.9 - 10.3 mg/dL   Total Protein 5.7 (L) 6.5 - 8.1 g/dL   Albumin 3.2 (L) 3.5 - 5.0 g/dL   AST 16 15 - 41 U/L   ALT 13 0 - 44 U/L   Alkaline Phosphatase 61 38 - 126 U/L   Total Bilirubin 0.8 0.3 - 1.2 mg/dL  GFR calc non Af Amer >60 >60 mL/min   GFR calc Af Amer >60 >60 mL/min   Anion gap 9 5 - 15    Comment: Performed at [facility], [address], [place], [place] [address]    Ct Head Wo Contrast  Result Date: 04/25/2019 CLINICAL DATA:  Fall landing on right side. EXAM: CT HEAD  WITHOUT CONTRAST CT CERVICAL SPINE WITHOUT CONTRAST TECHNIQUE: Multidetector CT imaging of the head and cervical spine was performed following the standard protocol without intravenous contrast. Multiplanar CT image reconstructions of the cervical spine were also generated. COMPARISON:  Head CT 10/30/2017, head and cervical spine CT 10/14/2015 FINDINGS: CT HEAD FINDINGS Brain: No intracranial hemorrhage, mass effect, or midline shift. Stable degree of atrophy and chronic small vessel ischemia from prior exam. Small remote occipital infarct. No hydrocephalus. The basilar cisterns are patent. No evidence of territorial infarct or acute ischemia. No extra-axial or intracranial fluid collection. Vascular: Atherosclerosis of skullbase vasculature without hyperdense vessel or abnormal calcification. Skull: No fracture or focal lesion. Sinuses/Orbits: Paranasal sinuses and mastoid air cells are clear. The visualized orbits are unremarkable. Bilateral cataract resection. Other: None. CT CERVICAL SPINE FINDINGS Mild motion artifact. Alignment: Exaggerated cervical lordosis. No traumatic subluxation. Unchanged mild retrolisthesis of C3 on C4, degenerative. Skull base and vertebrae: No acute fracture. Dens and skull base are intact. None fusion posterior elements of C2, congenital. Slight loss of height of T1 vertebral body is chronic and unchanged. Soft tissues and spinal canal: No prevertebral fluid or swelling. No visible canal hematoma. Disc levels: Multilevel degenerative disc disease most prominent at C4-C5. Multilevel facet hypertrophy. Upper chest: Small right apically pneumothorax as seen on chest radiograph earlier this day. Other: Carotid calcifications. IMPRESSION: 1. No acute intracranial abnormality. No skull fracture. Stable atrophy and chronic small vessel ischemia. 2. Multilevel degenerative change in the cervical spine without acute fracture or subluxation. 3. Small right apical pneumothorax as seen on chest  radiograph earlier this day. Electronically Signed   By: Narda Rutherford M.D.   On: 04/25/2019 01:42   Ct Cervical Spine Wo Contrast  Result Date: 04/25/2019 CLINICAL DATA:  Fall landing on right side. EXAM: CT HEAD WITHOUT CONTRAST CT CERVICAL SPINE WITHOUT CONTRAST TECHNIQUE: Multidetector CT imaging of the head and cervical spine was performed following the standard protocol without intravenous contrast. Multiplanar CT image reconstructions of the cervical spine were also generated. COMPARISON:  Head CT 10/30/2017, head and cervical spine CT 10/14/2015 FINDINGS: CT HEAD FINDINGS Brain: No intracranial hemorrhage, mass effect, or midline shift. Stable degree of atrophy and chronic small vessel ischemia from prior exam. Small remote occipital infarct. No hydrocephalus. The basilar cisterns are patent. No evidence of territorial infarct or acute ischemia. No extra-axial or intracranial fluid collection. Vascular: Atherosclerosis of skullbase vasculature without hyperdense vessel or abnormal calcification. Skull: No fracture or focal lesion. Sinuses/Orbits: Paranasal sinuses and mastoid air cells are clear. The visualized orbits are unremarkable. Bilateral cataract resection. Other: None. CT CERVICAL SPINE FINDINGS Mild motion artifact. Alignment: Exaggerated cervical lordosis. No traumatic subluxation. Unchanged mild retrolisthesis of C3 on C4, degenerative. Skull base and vertebrae: No acute fracture. Dens and skull base are intact. None fusion posterior elements of C2, congenital. Slight loss of height of T1 vertebral body is chronic and unchanged. Soft tissues and spinal canal: No prevertebral fluid or swelling. No visible canal hematoma. Disc levels: Multilevel degenerative disc disease most prominent at C4-C5. Multilevel facet hypertrophy. Upper chest: Small right apically pneumothorax as seen on  chest radiograph earlier this day. Other: Carotid calcifications. IMPRESSION: 1. No acute intracranial  abnormality. No skull fracture. Stable atrophy and chronic small vessel ischemia. 2. Multilevel degenerative change in the cervical spine without acute fracture or subluxation. 3. Small right apical pneumothorax as seen on chest radiograph earlier this day. Electronically Signed   By: Narda Rutherford M.D.   On: 04/25/2019 01:42   Dg Chest Portable 1 View  Result Date: 04/25/2019 CLINICAL DATA:  Fall diminished lung sounds on the right side EXAM: PORTABLE CHEST 1 VIEW COMPARISON:  11/20/2017, 10/30/2017 FINDINGS: Hazy opacity at the right lung base. Possible focal airspace disease or tiny effusion at the left base. Borderline cardiomegaly. Aortic graft similar in position. Aortic atherosclerosis. Small right apical pneumothorax, demonstrating 12 mm pleural-parenchymal separation at the apex, estimated at 10%. Acute right seventh displaced rib fracture. Possible nondisplaced right ninth rib fracture. Negative for midline shift. IMPRESSION: 1. Small right apical pneumothorax, estimated at 10% 2. Acute displaced right seventh rib fracture and possible nondisplaced right ninth rib fracture Critical Value/emergent results were called by telephone at the time of interpretation on 04/25/2019 at 12:38 am to Dr. Cy Blamer , who verbally acknowledged these results. Electronically Signed   By: Jasmine Pang M.D.   On: 04/25/2019 00:38    Review of Systems  All other systems reviewed and are negative.  Blood pressure (!) 161/71, pulse 92, temperature 97.7 F (36.5 C), temperature source Oral, resp. rate (!) 21, weight 55.7 kg, SpO2 99 %. Physical Exam  Vitals reviewed. Constitutional: She is oriented to person, place, and time. She appears well-developed and well-nourished. No distress.  Elderly female in nad; resting comfortably  HENT:  Head: Normocephalic and atraumatic.  Right Ear: External ear normal.  Left Ear: External ear normal.  Some temporal wasting  Eyes: Conjunctivae are normal. No scleral  icterus.  Neck: Normal range of motion. Neck supple. No tracheal deviation present. No thyromegaly present.  Cardiovascular: Normal rate and normal heart sounds.  Respiratory: Effort normal. No stridor. She exhibits tenderness.    Nonlabored. No crepitus. Not taking deep breathes. Some exp wheezes  GI: Soft. She exhibits no distension. There is no abdominal tenderness. There is no rebound.  Musculoskeletal:        General: No tenderness or edema.     Comments: B/l LE from knee to ankle have compression wraps  Lymphadenopathy:    She has no cervical adenopathy.  Neurological: She is alert and oriented to person, place, and time. She exhibits normal muscle tone.  Skin: Skin is warm and dry. Ecchymosis (in various states of healing) noted. No rash noted. She is not diaphoretic. No erythema. No pallor.  Psychiatric: She has a normal mood and affect. Her behavior is normal. Judgment and thought content normal.    Assessment/Plan: S/p fall Right rib fx x2 Small right pneumothorax RA Diabetes mellitus 2 COPD - pt states she is not on home o2 Chronic adrenal insufficiency  Keep on O2 pulm toilet IS, flutter valve Analgesia for rib fx - scheduled 650mg  tylenol, prn low dose muscle relaxant Repeat CXR mid-day 7/5; repeat morning 7/6 Cont pulse ox Hold antiplatelet PT/OT evaluations  Mary Sella. Andrey Campanile, MD, FACS General, Bariatric, & Minimally Invasive Surgery [facility]  Gaynelle Adu 04/25/2019, 9:54 AM

## 2019-04-25 NOTE — H&P (Signed)
History and Physical   [name] UXN:[id number] DOB: 1935-05-21 DOA: 04/24/2019  Referring MD/NP/PA: Dr. Nicholes Stairs  PCP: [name], MD   Outpatient Specialists: None  Patient coming from: Home  Chief Complaint: Fall with right chest pain  HPI: [name] is a 84 y.o. female with medical history significant of aortic stenosis, COPD on chronic oxygen, rheumatoid arthritis, pulmonary fibrosis.,  Gait instability, anxiety disorder, adrenal insufficiency who lives at home and has had previous falls.  Patient was brought in by EMS today after sustaining a fall and hitting her right chest on the counter.  Patient described as mechanical in nature.  She did not hit her head.  Did not have any loss of consciousness.  She was able to call EMS in.  Patient seen in the ER and evaluated.  She was found to have right-sided rib fractures involving the seventh and eighth ribs as well as 10% pneumothorax in the apex of the right lung.  She is having pain is 6 out of 10.  Denied any cough.  Baseline shortness of breath but currently on nonrebreather back.  Patient overall is fully awake and communicating.  Trauma surgery consulted and patient is being admitted to [facility] for close observation.  She does not require a chest tube at this point..  ED Course: Temperature is 98.6 blood pressure 168/78, pulse 101 respiratory of 26 oxygen sat 91% on nonrebreather bag.  White count 10.3 hemoglobin 11.8 and platelets 153.  Chemistry entirely within normal.  COVID-19's negative.  CT cervical spine and CT head without contrast showed no acute cranial abnormalities.  Multiple level degenerative change in the cervical spine is seen small right apical pneumothorax and a chest radiograph.  Chest x-ray showed 10% right apical pneumothorax.  Also showed acute displaced right seventh rib fracture and possible nondisplaced right ninth rib fracture.  Trauma surgery consulted.  Patient given a dose of Toradol and will be  admitted to the hospital for continued care.   Review of Systems: As per HPI otherwise 10 point review of systems negative.    Past Medical History:  Diagnosis Date   Adrenal insufficiency, primary, iatrogenic    Anxiety    Aortic stenosis, severe 02/26/2013   Chest pain    myoview 04/13/04-inferoapical thinning with a suggestion of mild apical ischemia, EF 66%   Depression 03/25/2013   Gait instability    Hearing loss 03/25/2013   Memory deficit 03/25/2013   Murmur, cardiac    Osteoporosis    Palpitations    Pancreatic insufficiency 11/09/2012   Physical deconditioning    Pneumonia    Pulmonary fibrosis ([place]) 03/25/2013   Rheumatoid arthritis(714.0)    SOBOE (shortness of breath on exertion)    echo 02/23/13-EF 55-60%, possible bicuspid aortic valve with mod to severed stenosis    Syncope    UTI (lower urinary tract infection) 10/01/2011    Past Surgical History:  Procedure Laterality Date   ABDOMINAL HYSTERECTOMY     CARDIAC CATHETERIZATION  11/08/96   no evidence of CAD, nl LV function   CHOLECYSTECTOMY     CORONARY ANGIOGRAM  03/22/2013   Procedure: CORONARY ANGIOGRAM;  Surgeon: Pixie Casino, MD;  Location: [facility] CATH LAB;  Service: Cardiovascular;;   KNEE ARTHROSCOPY     2 on left   RIGHT HEART CATHETERIZATION  03/22/2013   Procedure: RIGHT HEART CATH;  Surgeon: Pixie Casino, MD;  Location: [facility] CATH LAB;  Service: Cardiovascular;;   SHOULDER SURGERY Left  THYROIDECTOMY       reports that she has never smoked. She has never used smokeless tobacco. She reports that she does not drink alcohol or use drugs.  Allergies  Allergen Reactions   Penicillins Hives    Has patient had a PCN reaction causing immediate rash, facial/tongue/throat swelling, SOB or lightheadedness with hypotension: No Has patient had a PCN reaction causing severe rash involving mucus membranes or skin necrosis: No Has patient had a PCN reaction that required hospitalization  No Has patient had a PCN reaction occurring within the last 10 years: No If all of the above answers are "NO", then may proceed with Cephalosporin use.    Tape Other (See Comments)    Skin peels up.  Skin peels up.     Family History  Problem Relation Age of Onset   Cancer - Colon Father    Stroke Brother    Cancer Sister        spine?     Prior to Admission medications   Medication Sig Start Date End Date Taking? Authorizing Provider  acetaminophen (TYLENOL) 500 MG tablet Take 500 mg by mouth every 6 (six) hours as needed for moderate pain.   Yes [provider]  ALPRAZolam Prudy Feeler) 1 MG tablet Take 0.5 tablets (0.5 mg total) by mouth at bedtime as needed for anxiety. Anxiety Patient taking differently: Take 0.5 mg by mouth 2 (two) times daily. Anxiety  11/24/17  Yes Emokpae, Courage, MD  bisoprolol (ZEBETA) 5 MG tablet Take 0.5 tablets (2.5 mg total) by mouth daily. 07/24/16  Yes Hilty, Lisette Abu, MD  buPROPion (WELLBUTRIN) 100 MG tablet Take 100 mg by mouth every morning. 10/06/15  Yes [provider]  cholecalciferol (VITAMIN D3) 25 MCG (1000 UT) tablet Take 1,000 Units by mouth daily.   Yes [provider]  leflunomide (ARAVA) 20 MG tablet Take 1 tablet (20 mg total) by mouth daily. 11/10/17  Yes Tyrone Nine, MD  mirtazapine (REMERON) 30 MG tablet Take 30 mg by mouth at bedtime.    Yes [provider]  nortriptyline (PAMELOR) 25 MG capsule Take 1 capsule (25 mg total) by mouth at bedtime. 11/24/17  Yes Shon Hale, MD  predniSONE (DELTASONE) 5 MG tablet Take 5 mg by mouth 2 (two) times daily with a meal.  11/24/18  Yes [provider]  TRAVATAN Z 0.004 % SOLN ophthalmic solution Place 1 drop into both eyes at bedtime. 09/16/15  Yes [provider]  vitamin B-12 (CYANOCOBALAMIN) 1000 MCG tablet Take 1 tablet (1,000 mcg total) by mouth daily. 03/22/15  Yes Osvaldo Shipper, MD  furosemide (LASIX) 20 MG tablet Take 1 tablet (20 mg  total) by mouth daily as needed. Patient not taking: Reported on 07/24/2018 11/24/17 11/24/18  Shon Hale, MD  HYDROcodone-acetaminophen (NORCO/VICODIN) 5-325 MG tablet Take 1 tablet by mouth every 6 (six) hours as needed for moderate pain. Patient not taking: Reported on 07/24/2018 11/24/17   Shon Hale, MD  ondansetron (ZOFRAN) 4 MG tablet Take 1 tablet (4 mg total) by mouth every 6 (six) hours as needed for nausea. Patient not taking: Reported on 07/24/2018 11/24/17   Shon Hale, MD  predniSONE (DELTASONE) 10 MG tablet Take 40 mg (4 tablets) daily for 3 days, then 3 tablets daily for 3 days, then 2 tablets daily for 3 days, then 1 tablet daily for 4 days and stop Patient not taking: Reported on 04/25/2019 11/24/17   Shon Hale, MD  QUEtiapine (SEROQUEL) 25 MG tablet Take  0.5 tablets (12.5 mg total) by mouth every evening. Patient not taking: Reported on 07/24/2018 11/24/17   Shon Hale, MD    Physical Exam: Vitals:   04/24/19 2318 04/24/19 2319 04/25/19 0030 04/25/19 0154  BP:   (!) 155/69 (!) 151/63  Pulse:   95 90  Resp:   18 13  Temp: 97.6 F (36.4 C) 98.6 F (37 C)    TempSrc: Oral Oral    SpO2: 97%  99% 100%      Constitutional: Frail, chronically ill looking, no obvious distress Vitals:   04/24/19 2318 04/24/19 2319 04/25/19 0030 04/25/19 0154  BP:   (!) 155/69 (!) 151/63  Pulse:   95 90  Resp:   18 13  Temp: 97.6 F (36.4 C) 98.6 F (37 C)    TempSrc: Oral Oral    SpO2: 97%  99% 100%   Eyes: PERRL, lids and conjunctivae normal ENMT: Mucous membranes are moist. Posterior pharynx clear of any exudate or lesions.Normal dentition.  Neck: normal, supple, no masses, no thyromegaly Respiratory: Decreased air entry bilaterally more on the right, mild expiratory wheezing with rhonchi's, tenderness in the right chest wall, increased respiratory effort. No accessory muscle use.  Cardiovascular: Tachycardic no murmurs / rubs / gallops. No extremity edema. 2+ pedal  pulses. No carotid bruits.  Abdomen: no tenderness, no masses palpated. No hepatosplenomegaly. Bowel sounds positive.  Musculoskeletal: no clubbing / cyanosis. No joint deformity upper and lower extremities. Good ROM, no contractures. Normal muscle tone.  Skin: no rashes, lesions, ulcers. No induration Neurologic: CN 2-12 grossly intact. Sensation intact, DTR normal. Strength 5/5 in all 4.  Psychiatric: Normal judgment and insight. Alert and oriented x 3. Normal mood.     Labs on Admission: I have personally reviewed following labs and imaging studies  CBC: Recent Labs  Lab 04/24/19 2309  WBC 10.3  NEUTROABS 7.3  HGB 11.8*  HCT 38.1  MCV 99.0  PLT 153   Basic Metabolic Panel: Recent Labs  Lab 04/24/19 2309  NA 143  K 4.1  CL 107  CO2 27  GLUCOSE 108*  BUN 21  CREATININE 0.73  CALCIUM 9.0   GFR: CrCl cannot be calculated (Unknown ideal weight.). Liver Function Tests: No results for input(s): AST, ALT, ALKPHOS, BILITOT, PROT, ALBUMIN in the last 168 hours. No results for input(s): LIPASE, AMYLASE in the last 168 hours. No results for input(s): AMMONIA in the last 168 hours. Coagulation Profile: No results for input(s): INR, PROTIME in the last 168 hours. Cardiac Enzymes: No results for input(s): CKTOTAL, CKMB, CKMBINDEX, TROPONINI in the last 168 hours. BNP (last 3 results) No results for input(s): PROBNP in the last 8760 hours. HbA1C: No results for input(s): HGBA1C in the last 72 hours. CBG: No results for input(s): GLUCAP in the last 168 hours. Lipid Profile: No results for input(s): CHOL, HDL, LDLCALC, TRIG, CHOLHDL, LDLDIRECT in the last 72 hours. Thyroid Function Tests: No results for input(s): TSH, T4TOTAL, FREET4, T3FREE, THYROIDAB in the last 72 hours. Anemia Panel: No results for input(s): VITAMINB12, FOLATE, FERRITIN, TIBC, IRON, RETICCTPCT in the last 72 hours. Urine analysis:    Component Value Date/Time   COLORURINE YELLOW 11/21/2017 0740    APPEARANCEUR CLEAR 11/21/2017 0740   LABSPEC 1.014 11/21/2017 0740   PHURINE 6.0 11/21/2017 0740   GLUCOSEU NEGATIVE 11/21/2017 0740   HGBUR SMALL (A) 11/21/2017 0740   BILIRUBINUR NEGATIVE 11/21/2017 0740   KETONESUR NEGATIVE 11/21/2017 0740   PROTEINUR NEGATIVE 11/21/2017 0740   UROBILINOGEN  0.2 03/21/2015 0236   NITRITE NEGATIVE 11/21/2017 0740   LEUKOCYTESUR MODERATE (A) 11/21/2017 0740   Sepsis Labs: @LABRCNTIP (procalcitonin:4,lacticidven:4) ) Recent Results (from the past 240 hour(s))  SARS Coronavirus 2 (CEPHEID- Performed in [facility] hospital lab), Hosp Order     Status: None   Collection Time: 04/25/19  1:33 AM   Specimen: Nasopharyngeal Swab  Result Value Ref Range Status   SARS Coronavirus 2 NEGATIVE NEGATIVE Final    Comment: (NOTE) If result is NEGATIVE SARS-CoV-2 target nucleic acids are NOT DETECTED. The SARS-CoV-2 RNA is generally detectable in upper and lower  respiratory specimens during the acute phase of infection. The lowest  concentration of SARS-CoV-2 viral copies this assay can detect is 250  copies / mL. A negative result does not preclude SARS-CoV-2 infection  and should not be used as the sole basis for treatment or other  patient management decisions.  A negative result may occur with  improper specimen collection / handling, submission of specimen other  than nasopharyngeal swab, presence of viral mutation(s) within the  areas targeted by this assay, and inadequate number of viral copies  (<250 copies / mL). A negative result must be combined with clinical  observations, patient history, and epidemiological information. If result is POSITIVE SARS-CoV-2 target nucleic acids are DETECTED. The SARS-CoV-2 RNA is generally detectable in upper and lower  respiratory specimens dur ing the acute phase of infection.  Positive  results are indicative of active infection with SARS-CoV-2.  Clinical  correlation with patient history and other diagnostic  information is  necessary to determine patient infection status.  Positive results do  not rule out bacterial infection or co-infection with other viruses. If result is PRESUMPTIVE POSTIVE SARS-CoV-2 nucleic acids MAY BE PRESENT.   A presumptive positive result was obtained on the submitted specimen  and confirmed on repeat testing.  While 2019 novel coronavirus  (SARS-CoV-2) nucleic acids may be present in the submitted sample  additional confirmatory testing may be necessary for epidemiological  and / or clinical management purposes  to differentiate between  SARS-CoV-2 and other Sarbecovirus currently known to infect humans.  If clinically indicated additional testing with an alternate test  methodology [phone]) is advised. The SARS-CoV-2 RNA is generally  detectable in upper and lower respiratory sp ecimens during the acute  phase of infection. The expected result is Negative. Fact Sheet for Patients:  BoilerBrush.com.cyhttps://www.fda.gov/media/136312/download Fact Sheet for Healthcare Providers: https://pope.com/https://www.fda.gov/media/136313/download This test is not yet approved or cleared by the [place] FDA and has been authorized for detection and/or diagnosis of SARS-CoV-2 by FDA under an Emergency Use Authorization (EUA).  This EUA will remain in effect (meaning this test can be used) for the duration of the COVID-19 declaration under Section 564(b)(1) of the Act, 21 U.S.C. section 360bbb-3(b)(1), unless the authorization is terminated or revoked sooner. Performed at [facility], [address] [place], [place] [address]      Radiological Exams on Admission: Ct Head Wo Contrast  Result Date: 04/25/2019 CLINICAL DATA:  Fall landing on right side. EXAM: CT HEAD WITHOUT CONTRAST CT CERVICAL SPINE WITHOUT CONTRAST TECHNIQUE: Multidetector CT imaging of the head and cervical spine was performed following the standard protocol without intravenous contrast. Multiplanar CT image  reconstructions of the cervical spine were also generated. COMPARISON:  Head CT 10/30/2017, head and cervical spine CT 10/14/2015 FINDINGS: CT HEAD FINDINGS Brain: No intracranial hemorrhage, mass effect, or midline shift. Stable degree of atrophy and chronic small vessel ischemia from prior  exam. Small remote occipital infarct. No hydrocephalus. The basilar cisterns are patent. No evidence of territorial infarct or acute ischemia. No extra-axial or intracranial fluid collection. Vascular: Atherosclerosis of skullbase vasculature without hyperdense vessel or abnormal calcification. Skull: No fracture or focal lesion. Sinuses/Orbits: Paranasal sinuses and mastoid air cells are clear. The visualized orbits are unremarkable. Bilateral cataract resection. Other: None. CT CERVICAL SPINE FINDINGS Mild motion artifact. Alignment: Exaggerated cervical lordosis. No traumatic subluxation. Unchanged mild retrolisthesis of C3 on C4, degenerative. Skull base and vertebrae: No acute fracture. Dens and skull base are intact. None fusion posterior elements of C2, congenital. Slight loss of height of T1 vertebral body is chronic and unchanged. Soft tissues and spinal canal: No prevertebral fluid or swelling. No visible canal hematoma. Disc levels: Multilevel degenerative disc disease most prominent at C4-C5. Multilevel facet hypertrophy. Upper chest: Small right apically pneumothorax as seen on chest radiograph earlier this day. Other: Carotid calcifications. IMPRESSION: 1. No acute intracranial abnormality. No skull fracture. Stable atrophy and chronic small vessel ischemia. 2. Multilevel degenerative change in the cervical spine without acute fracture or subluxation. 3. Small right apical pneumothorax as seen on chest radiograph earlier this day. Electronically Signed   By: Narda RutherfordMelanie  Sanford M.D.   On: 04/25/2019 01:42   Ct Cervical Spine Wo Contrast  Result Date: 04/25/2019 CLINICAL DATA:  Fall landing on right side. EXAM: CT  HEAD WITHOUT CONTRAST CT CERVICAL SPINE WITHOUT CONTRAST TECHNIQUE: Multidetector CT imaging of the head and cervical spine was performed following the standard protocol without intravenous contrast. Multiplanar CT image reconstructions of the cervical spine were also generated. COMPARISON:  Head CT 10/30/2017, head and cervical spine CT 10/14/2015 FINDINGS: CT HEAD FINDINGS Brain: No intracranial hemorrhage, mass effect, or midline shift. Stable degree of atrophy and chronic small vessel ischemia from prior exam. Small remote occipital infarct. No hydrocephalus. The basilar cisterns are patent. No evidence of territorial infarct or acute ischemia. No extra-axial or intracranial fluid collection. Vascular: Atherosclerosis of skullbase vasculature without hyperdense vessel or abnormal calcification. Skull: No fracture or focal lesion. Sinuses/Orbits: Paranasal sinuses and mastoid air cells are clear. The visualized orbits are unremarkable. Bilateral cataract resection. Other: None. CT CERVICAL SPINE FINDINGS Mild motion artifact. Alignment: Exaggerated cervical lordosis. No traumatic subluxation. Unchanged mild retrolisthesis of C3 on C4, degenerative. Skull base and vertebrae: No acute fracture. Dens and skull base are intact. None fusion posterior elements of C2, congenital. Slight loss of height of T1 vertebral body is chronic and unchanged. Soft tissues and spinal canal: No prevertebral fluid or swelling. No visible canal hematoma. Disc levels: Multilevel degenerative disc disease most prominent at C4-C5. Multilevel facet hypertrophy. Upper chest: Small right apically pneumothorax as seen on chest radiograph earlier this day. Other: Carotid calcifications. IMPRESSION: 1. No acute intracranial abnormality. No skull fracture. Stable atrophy and chronic small vessel ischemia. 2. Multilevel degenerative change in the cervical spine without acute fracture or subluxation. 3. Small right apical pneumothorax as seen on  chest radiograph earlier this day. Electronically Signed   By: Narda RutherfordMelanie  Sanford M.D.   On: 04/25/2019 01:42   Dg Chest Portable 1 View  Result Date: 04/25/2019 CLINICAL DATA:  Fall diminished lung sounds on the right side EXAM: PORTABLE CHEST 1 VIEW COMPARISON:  11/20/2017, 10/30/2017 FINDINGS: Hazy opacity at the right lung base. Possible focal airspace disease or tiny effusion at the left base. Borderline cardiomegaly. Aortic graft similar in position. Aortic atherosclerosis. Small right apical pneumothorax, demonstrating 12 mm pleural-parenchymal separation at the apex, estimated  at 10%. Acute right seventh displaced rib fracture. Possible nondisplaced right ninth rib fracture. Negative for midline shift. IMPRESSION: 1. Small right apical pneumothorax, estimated at 10% 2. Acute displaced right seventh rib fracture and possible nondisplaced right ninth rib fracture Critical Value/emergent results were called by telephone at the time of interpretation on 04/25/2019 at 12:38 am to Dr. Cy Blamer , who verbally acknowledged these results. Electronically Signed   By: Jasmine Pang M.D.   On: 04/25/2019 00:38    EKG: Independently reviewed.  It shows sinus tachycardia with premature ventricular complexes, ST depressions and T wave inversions in the lateral leads but unchanged from previous.  Assessment/Plan Principal Problem:   Pneumothorax Active Problems:   Weakness generalized   Rheumatoid arthritis involving multiple joints (HCC)   DM2 (diabetes mellitus, type 2) (HCC)   Chronic adrenal insufficiency (HCC)   Pulmonary fibrosis (HCC)   COPD (chronic obstructive pulmonary disease) (HCC)   S/P TAVR (transcatheter aortic valve replacement)   Multiple fractures of ribs, right side, initial encounter for closed fracture   Fall at home, initial encounter     #1 right apical pneumothorax: Only 10% at this point.  Trauma consulted.  Patient will be admitted to progressive care and closely monitor.   Follow-up chest x-rays.  Does not require a chest tube or any invasive intervention at this point.  Continue monitoring.  Continue oxygen with nonrebreather bag.  #2 status post fall: Patient has known gait abnormalities.  PT and OT evaluation prior to discharge.  Disposition for possible rehab.  #3 rheumatoid arthritis: Continue home regiment of treatment.  #4 diabetes: Sliding scale insulin.  Continue home regimen.   #5 COPD: No evidence of exacerbation.  Continue oxygen and empiric treatment from home.  #6 chronic adrenal insufficiency: Continue home oral replacement.  #7 rib fractures: Patient appears to have old left-sided rib fracture also.  Pain management with incentive spirometry.  Continue per trauma surgery     DVT prophylaxis: Heparin Code Status: Full code Family Communication: Patient fully communicative.  Family not available at this time. Disposition Plan: To be determined Consults called: Trauma surgery Admission status: Inpatient  Severity of Illness: The appropriate patient status for this patient is INPATIENT. Inpatient status is judged to be reasonable and necessary in order to provide the required intensity of service to ensure the patient's safety. The patient's presenting symptoms, physical exam findings, and initial radiographic and laboratory data in the context of their chronic comorbidities is felt to place them at high risk for further clinical deterioration. Furthermore, it is not anticipated that the patient will be medically stable for discharge from the hospital within 2 midnights of admission. The following factors support the patient status of inpatient.   " The patient's presenting symptoms include fall with right-sided chest pain. " The worrisome physical exam findings include cachectic but tenderness in the right side. " The initial radiographic and laboratory data are worrisome because of 10% pneumothorax. " The chronic co-morbidities include COPD  on home O2.   * I certify that at the point of admission it is my clinical judgment that the patient will require inpatient hospital care spanning beyond 2 midnights from the point of admission due to high intensity of service, high risk for further deterioration and high frequency of surveillance required.Lonia Blood MD [facility] Pager [phone]  If 7PM-7AM, please contact night-coverage [email or web address] Password [email or web address]  04/25/2019, 3:51 AM

## 2019-04-25 NOTE — ED Provider Notes (Signed)
[facility]-EMERGENCY DEPT Provider Note   CSN: [id number] Arrival date & time: 04/24/19  2255     History   Chief Complaint Chief Complaint  Patient presents with   Fall   Chest Pain    HPI [name] is a 84 y.o. female.     The history is provided by the EMS personnel.  Fall This is a new problem. The current episode started less than 1 hour ago. The problem occurs constantly. The problem has not changed since onset.Pertinent negatives include no abdominal pain, no headaches and no shortness of breath. Associated symptoms comments: Rib pain. Nothing relieves the symptoms. She has tried nothing for the symptoms. The treatment provided no relief.  Patient had a fall at home striking right ribs and causing pain.  Did not hit head no LOC.    Past Medical History:  Diagnosis Date   Adrenal insufficiency, primary, iatrogenic    Anxiety    Aortic stenosis, severe 02/26/2013   Chest pain    myoview 04/13/04-inferoapical thinning with a suggestion of mild apical ischemia, EF 66%   Depression 03/25/2013   Gait instability    Hearing loss 03/25/2013   Memory deficit 03/25/2013   Murmur, cardiac    Osteoporosis    Palpitations    Pancreatic insufficiency 11/09/2012   Physical deconditioning    Pneumonia    Pulmonary fibrosis (HCC) 03/25/2013   Rheumatoid arthritis(714.0)    SOBOE (shortness of breath on exertion)    echo 02/23/13-EF 55-60%, possible bicuspid aortic valve with mod to severed stenosis    Syncope    UTI (lower urinary tract infection) 10/01/2011    Patient Active Problem List   Diagnosis Date Noted   SIRS (systemic inflammatory response syndrome) (HCC) 11/20/2017   HCAP (healthcare-associated pneumonia) 10/30/2017   Fracture of three ribs, left, closed, initial encounter 10/30/2017   Sepsis associated hypotension (HCC) 10/19/2017   Elevated troponin 10/19/2017   Dizziness 07/24/2016   Dyspnea 12/06/2015   Hypoxia  12/06/2015   Hypokalemia 10/21/2015   Hypophosphatemia 10/21/2015   Hypomagnesemia 10/21/2015   CAP (community acquired pneumonia) 10/20/2015   B12 deficiency    Malnutrition of moderate degree (HCC) 03/18/2015   Altered mental state 03/17/2015   Metabolic encephalopathy 09/12/2014    Class: Acute   Sepsis secondary to UTI (HCC) 09/09/2014   S/P TAVR (transcatheter aortic valve replacement) 03/16/2014   Left rib fracture 11/17/2013   Closed pelvic fracture (HCC) 11/17/2013   Sepsis (HCC) 11/10/2013   UTI (lower urinary tract infection) 11/09/2013   Sepsis syndrome 09/17/2013   COPD (chronic obstructive pulmonary disease) (HCC) 07/20/2013   Chest pain 06/29/2013   Hearing loss 03/25/2013   Memory deficit 03/25/2013   Depression 03/25/2013   Syncope 03/25/2013   Pulmonary fibrosis (HCC)    Aortic stenosis, severe 02/26/2013   Pancreatic insufficiency (HCC) 11/09/2012    Class: Chronic   Low back pain 01/16/2012    Class: Acute   Gait instability 01/16/2012    Class: Acute   Sinusitis, acute, sphenoidal 11/17/2011    Class: Acute   Pneumonia 11/12/2011    Class: Acute   Chronic adrenal insufficiency (HCC) 11/12/2011    Class: Chronic   E. coli UTI (urinary tract infection) 10/01/2011   Fever 10/01/2011   Cough 10/01/2011   Failure to thrive in adult 10/01/2011   Weakness generalized 10/01/2011   Rheumatoid arthritis involving multiple joints (HCC) 10/01/2011   DM2 (diabetes mellitus, type 2) (HCC) 10/01/2011  Past Surgical History:  Procedure Laterality Date   ABDOMINAL HYSTERECTOMY     CARDIAC CATHETERIZATION  11/08/96   no evidence of CAD, nl LV function   CHOLECYSTECTOMY     CORONARY ANGIOGRAM  03/22/2013   Procedure: CORONARY ANGIOGRAM;  Surgeon: Pixie Casino, MD;  Location: [facility] CATH LAB;  Service: Cardiovascular;;   KNEE ARTHROSCOPY     2 on left   RIGHT HEART CATHETERIZATION  03/22/2013   Procedure: RIGHT HEART  CATH;  Surgeon: Pixie Casino, MD;  Location: [facility] CATH LAB;  Service: Cardiovascular;;   SHOULDER SURGERY Left    THYROIDECTOMY       OB History   No obstetric history on file.      Home Medications    Prior to Admission medications   Medication Sig Start Date End Date Taking? Authorizing Provider  ALPRAZolam Duanne Moron) 1 MG tablet Take 0.5 tablets (0.5 mg total) by mouth at bedtime as needed for anxiety. Anxiety 11/24/17   Roxan Hockey, MD  apixaban (ELIQUIS) 5 MG TABS tablet TAKE 1 TABLET BY MOUTH TWICE A DAY 01/23/18   [provider]  bisoprolol (ZEBETA) 5 MG tablet Take 0.5 tablets (2.5 mg total) by mouth daily. 07/24/16   Hilty, Nadean Corwin, MD  buPROPion (WELLBUTRIN) 100 MG tablet Take 100 mg by mouth every morning. 10/06/15   [provider]  furosemide (LASIX) 20 MG tablet Take 1 tablet (20 mg total) by mouth daily as needed. Patient not taking: Reported on 07/24/2018 11/24/17 11/24/18  Roxan Hockey, MD  HYDROcodone-acetaminophen (NORCO/VICODIN) 5-325 MG tablet Take 1 tablet by mouth every 6 (six) hours as needed for moderate pain. Patient not taking: Reported on 07/24/2018 11/24/17   Roxan Hockey, MD  leflunomide (ARAVA) 20 MG tablet Take 1 tablet (20 mg total) by mouth daily. 11/10/17   Patrecia Pour, MD  mirtazapine (REMERON) 30 MG tablet Take 30 mg by mouth at bedtime.     [provider]  nortriptyline (PAMELOR) 25 MG capsule Take 1 capsule (25 mg total) by mouth at bedtime. 11/24/17   Roxan Hockey, MD  ondansetron (ZOFRAN) 4 MG tablet Take 1 tablet (4 mg total) by mouth every 6 (six) hours as needed for nausea. Patient not taking: Reported on 07/24/2018 11/24/17   Roxan Hockey, MD  Phenol-Glycerin (CHLORASEPTIC MAX SORE THROAT) 1.5-33 % LIQD Use as directed 2 sprays in the mouth or throat 3 (three) times daily as needed (Sore Throat).    [provider]  polyethylene glycol (MIRALAX / GLYCOLAX) packet Take 17 g by mouth daily.     [provider]  potassium chloride SA (K-DUR,KLOR-CON) 20 MEQ tablet Take 20 mEq by mouth 2 (two) times daily.    [provider]  predniSONE (DELTASONE) 10 MG tablet Take 40 mg (4 tablets) daily for 3 days, then 3 tablets daily for 3 days, then 2 tablets daily for 3 days, then 1 tablet daily for 4 days and stop 11/24/17   Roxan Hockey, MD  QUEtiapine (SEROQUEL) 25 MG tablet Take 0.5 tablets (12.5 mg total) by mouth every evening. Patient not taking: Reported on 07/24/2018 11/24/17   Roxan Hockey, MD  senna-docusate (SENOKOT-S) 8.6-50 MG tablet Take 1 tablet by mouth at bedtime as needed for mild constipation.    [provider]  TRAVATAN Z 0.004 % SOLN ophthalmic solution Place 1 drop into both eyes at bedtime. 09/16/15   [provider]  vitamin B-12 (CYANOCOBALAMIN) 1000 MCG tablet Take 1 tablet (1,000 mcg  total) by mouth daily. 03/22/15   Osvaldo Shipper, MD    Family History Family History  Problem Relation Age of Onset   Cancer - Colon Father    Stroke Brother    Cancer Sister        spine?    Social History Social History   Tobacco Use   Smoking status: Never Smoker   Smokeless tobacco: Never Used  Substance Use Topics   Alcohol use: No   Drug use: No     Allergies   Penicillins and Tape   Review of Systems Review of Systems  Constitutional: Negative for fever.  Respiratory: Negative for shortness of breath.   Cardiovascular: Negative for palpitations and leg swelling.  Gastrointestinal: Negative for abdominal pain.  Genitourinary: Negative for flank pain.  Musculoskeletal: Negative for back pain.  Neurological: Negative for headaches.  All other systems reviewed and are negative.    Physical Exam Updated Vital Signs BP (!) 168/78    Pulse (!) 101    Temp 98.6 F (37 C) (Oral)    Resp (!) 26    SpO2 97%   Physical Exam Vitals signs and nursing note reviewed.  Constitutional:      General: She is not in acute  distress.    Appearance: She is normal weight.  HENT:     Head: Normocephalic and atraumatic.     Right Ear: Tympanic membrane normal.     Left Ear: Tympanic membrane normal.     Nose: Nose normal.  Eyes:     Conjunctiva/sclera: Conjunctivae normal.     Pupils: Pupils are equal, round, and reactive to light.  Neck:     Musculoskeletal: Normal range of motion and neck supple.  Cardiovascular:     Rate and Rhythm: Normal rate and regular rhythm.     Pulses: Normal pulses.     Heart sounds: Normal heart sounds.  Pulmonary:     Effort: Pulmonary effort is normal.     Breath sounds: No stridor. No rales.  Abdominal:     General: Abdomen is flat. Bowel sounds are normal.     Tenderness: There is no abdominal tenderness. There is no guarding or rebound.  Musculoskeletal:        General: No deformity.  Skin:    General: Skin is warm and dry.     Capillary Refill: Capillary refill takes less than 2 seconds.  Neurological:     General: No focal deficit present.     Mental Status: She is alert.  Psychiatric:        Mood and Affect: Mood normal.        Behavior: Behavior normal.      ED Treatments / Results  Labs (all labs ordered are listed, but only abnormal results are displayed) Results for orders placed or performed during the hospital encounter of 04/24/19  SARS Coronavirus 2 (CEPHEID- Performed in [facility] hospital lab), [facility] Order   Specimen: Nasopharyngeal Swab  Result Value Ref Range   SARS Coronavirus 2 NEGATIVE NEGATIVE  CBC with Differential/Platelet  Result Value Ref Range   WBC 10.3 4.0 - 10.5 K/uL   RBC 3.85 (L) 3.87 - 5.11 MIL/uL   Hemoglobin 11.8 (L) 12.0 - 15.0 g/dL   HCT 48.5 46.2 - 70.3 %   MCV 99.0 80.0 - 100.0 fL   MCH 30.6 26.0 - 34.0 pg   MCHC 31.0 30.0 - 36.0 g/dL   RDW 50.0 93.8 - 18.2 %   Platelets 153 150 -  400 K/uL   nRBC 0.0 0.0 - 0.2 %   Neutrophils Relative % 70 %   Neutro Abs 7.3 1.7 - 7.7 K/uL   Lymphocytes Relative 17 %   Lymphs  Abs 1.7 0.7 - 4.0 K/uL   Monocytes Relative 9 %   Monocytes Absolute 0.9 0.1 - 1.0 K/uL   Eosinophils Relative 2 %   Eosinophils Absolute 0.2 0.0 - 0.5 K/uL   Basophils Relative 1 %   Basophils Absolute 0.1 0.0 - 0.1 K/uL   Immature Granulocytes 1 %   Abs Immature Granulocytes 0.13 (H) 0.00 - 0.07 K/uL  Basic metabolic panel  Result Value Ref Range   Sodium 143 135 - 145 mmol/L   Potassium 4.1 3.5 - 5.1 mmol/L   Chloride 107 98 - 111 mmol/L   CO2 27 22 - 32 mmol/L   Glucose, Bld 108 (H) 70 - 99 mg/dL   BUN 21 8 - 23 mg/dL   Creatinine, Ser 1.94 0.44 - 1.00 mg/dL   Calcium 9.0 8.9 - 17.4 mg/dL   GFR calc non Af Amer >60 >60 mL/min   GFR calc Af Amer >60 >60 mL/min   Anion gap 9 5 - 15  Troponin I (High Sensitivity)  Result Value Ref Range   Troponin I (High Sensitivity) 50 (H) <18 ng/L   Ct Head Wo Contrast  Result Date: 04/25/2019 CLINICAL DATA:  Fall landing on right side. EXAM: CT HEAD WITHOUT CONTRAST CT CERVICAL SPINE WITHOUT CONTRAST TECHNIQUE: Multidetector CT imaging of the head and cervical spine was performed following the standard protocol without intravenous contrast. Multiplanar CT image reconstructions of the cervical spine were also generated. COMPARISON:  Head CT 10/30/2017, head and cervical spine CT 10/14/2015 FINDINGS: CT HEAD FINDINGS Brain: No intracranial hemorrhage, mass effect, or midline shift. Stable degree of atrophy and chronic small vessel ischemia from prior exam. Small remote occipital infarct. No hydrocephalus. The basilar cisterns are patent. No evidence of territorial infarct or acute ischemia. No extra-axial or intracranial fluid collection. Vascular: Atherosclerosis of skullbase vasculature without hyperdense vessel or abnormal calcification. Skull: No fracture or focal lesion. Sinuses/Orbits: Paranasal sinuses and mastoid air cells are clear. The visualized orbits are unremarkable. Bilateral cataract resection. Other: None. CT CERVICAL SPINE FINDINGS  Mild motion artifact. Alignment: Exaggerated cervical lordosis. No traumatic subluxation. Unchanged mild retrolisthesis of C3 on C4, degenerative. Skull base and vertebrae: No acute fracture. Dens and skull base are intact. None fusion posterior elements of C2, congenital. Slight loss of height of T1 vertebral body is chronic and unchanged. Soft tissues and spinal canal: No prevertebral fluid or swelling. No visible canal hematoma. Disc levels: Multilevel degenerative disc disease most prominent at C4-C5. Multilevel facet hypertrophy. Upper chest: Small right apically pneumothorax as seen on chest radiograph earlier this day. Other: Carotid calcifications. IMPRESSION: 1. No acute intracranial abnormality. No skull fracture. Stable atrophy and chronic small vessel ischemia. 2. Multilevel degenerative change in the cervical spine without acute fracture or subluxation. 3. Small right apical pneumothorax as seen on chest radiograph earlier this day. Electronically Signed   By: Narda Rutherford M.D.   On: 04/25/2019 01:42   Ct Cervical Spine Wo Contrast  Result Date: 04/25/2019 CLINICAL DATA:  Fall landing on right side. EXAM: CT HEAD WITHOUT CONTRAST CT CERVICAL SPINE WITHOUT CONTRAST TECHNIQUE: Multidetector CT imaging of the head and cervical spine was performed following the standard protocol without intravenous contrast. Multiplanar CT image reconstructions of the cervical spine were also generated. COMPARISON:  Head CT  10/30/2017, head and cervical spine CT 10/14/2015 FINDINGS: CT HEAD FINDINGS Brain: No intracranial hemorrhage, mass effect, or midline shift. Stable degree of atrophy and chronic small vessel ischemia from prior exam. Small remote occipital infarct. No hydrocephalus. The basilar cisterns are patent. No evidence of territorial infarct or acute ischemia. No extra-axial or intracranial fluid collection. Vascular: Atherosclerosis of skullbase vasculature without hyperdense vessel or abnormal  calcification. Skull: No fracture or focal lesion. Sinuses/Orbits: Paranasal sinuses and mastoid air cells are clear. The visualized orbits are unremarkable. Bilateral cataract resection. Other: None. CT CERVICAL SPINE FINDINGS Mild motion artifact. Alignment: Exaggerated cervical lordosis. No traumatic subluxation. Unchanged mild retrolisthesis of C3 on C4, degenerative. Skull base and vertebrae: No acute fracture. Dens and skull base are intact. None fusion posterior elements of C2, congenital. Slight loss of height of T1 vertebral body is chronic and unchanged. Soft tissues and spinal canal: No prevertebral fluid or swelling. No visible canal hematoma. Disc levels: Multilevel degenerative disc disease most prominent at C4-C5. Multilevel facet hypertrophy. Upper chest: Small right apically pneumothorax as seen on chest radiograph earlier this day. Other: Carotid calcifications. IMPRESSION: 1. No acute intracranial abnormality. No skull fracture. Stable atrophy and chronic small vessel ischemia. 2. Multilevel degenerative change in the cervical spine without acute fracture or subluxation. 3. Small right apical pneumothorax as seen on chest radiograph earlier this day. Electronically Signed   By: Narda RutherfordMelanie  Sanford M.D.   On: 04/25/2019 01:42   Dg Chest Portable 1 View  Result Date: 04/25/2019 CLINICAL DATA:  Fall diminished lung sounds on the right side EXAM: PORTABLE CHEST 1 VIEW COMPARISON:  11/20/2017, 10/30/2017 FINDINGS: Hazy opacity at the right lung base. Possible focal airspace disease or tiny effusion at the left base. Borderline cardiomegaly. Aortic graft similar in position. Aortic atherosclerosis. Small right apical pneumothorax, demonstrating 12 mm pleural-parenchymal separation at the apex, estimated at 10%. Acute right seventh displaced rib fracture. Possible nondisplaced right ninth rib fracture. Negative for midline shift. IMPRESSION: 1. Small right apical pneumothorax, estimated at 10% 2. Acute  displaced right seventh rib fracture and possible nondisplaced right ninth rib fracture Critical Value/emergent results were called by telephone at the time of interpretation on 04/25/2019 at 12:38 am to Dr. Cy BlamerAPRIL Anitra Doxtater , who verbally acknowledged these results. Electronically Signed   By: Jasmine PangKim  Fujinaga M.D.   On: 04/25/2019 00:38    EKG EKG Interpretation  Date/Time:  Saturday April 24 2019 23:13:05 EDT Ventricular Rate:  101 PR Interval:    QRS Duration: 146 QT Interval:  342 QTC Calculation: 444 R Axis:   -24 Text Interpretation:  Sinus tachycardia Ventricular premature complex unchanged from previous Confirmed by Ercell Razon ([id number]) on 04/24/2019 11:28:02 PM   Radiology No results found.  Procedures Procedures (including critical care time)  Medications Ordered in ED Medications  lidocaine (LIDODERM) 5 % 2 patch (2 patches Transdermal Patch Applied 04/25/19 0123)  fentaNYL (SUBLIMAZE) injection 50 mcg (50 mcg Intravenous Given 04/25/19 0126)  ketorolac (TORADOL) 30 MG/ML injection 15 mg (15 mg Intravenous Given 04/25/19 0128)     Final Clinical Impressions(s) / ED Diagnoses   Case d/w Dr. Andrey CampanileWilson of surgery.  [facility]   Shamon Lobo, MD 04/25/19 [phone]

## 2019-04-25 NOTE — Progress Notes (Signed)
Patient arrived from [facility] via [facility]  and oriented, with cervical collar intact.Attached to cardiac monitor,oriented to room,V/S checked,CCMD notified Will give report to AM shift RN.

## 2019-04-25 NOTE — Progress Notes (Signed)
Ground level fall No LOC On antiplatelet agent Neg head ct Small apical right PTX 1-2 rib fxs  Admit to [facility] since technically a trauma Keep on O2 pulm toilet IS, flutter valve Analgesia for rib fx - scheduled 650mg  tylenol, prn low dose muscle relaxant Repeat CXR mid-day 7/5 Cont pulse ox Hold antiplatelet Full consult to follow   Leighton Ruff. Redmond Pulling, MD, FACS General, Bariatric, & Minimally Invasive Surgery [facility], [place]

## 2019-04-26 ENCOUNTER — Inpatient Hospital Stay (HOSPITAL_COMMUNITY): Payer: Medicare Other

## 2019-04-26 DIAGNOSIS — I340 Nonrheumatic mitral (valve) insufficiency: Secondary | ICD-10-CM

## 2019-04-26 DIAGNOSIS — I361 Nonrheumatic tricuspid (valve) insufficiency: Secondary | ICD-10-CM

## 2019-04-26 LAB — BASIC METABOLIC PANEL
Anion gap: 11 (ref 5–15)
BUN: 8 mg/dL (ref 8–23)
CO2: 27 mmol/L (ref 22–32)
Calcium: 8.6 mg/dL — ABNORMAL LOW (ref 8.9–10.3)
Chloride: 103 mmol/L (ref 98–111)
Creatinine, Ser: 0.52 mg/dL (ref 0.44–1.00)
GFR calc Af Amer: >60 mL/min (ref >60)
GFR calc non Af Amer: >60 mL/min (ref >60)
Glucose, Bld: 93 mg/dL (ref 70–99)
Potassium: 4 mmol/L (ref 3.5–5.1)
Sodium: 141 mmol/L (ref 135–145)

## 2019-04-26 LAB — ECHOCARDIOGRAM COMPLETE
Height: 63 in
Weight: 1964.74 oz

## 2019-04-26 LAB — GLUCOSE, CAPILLARY
Glucose-Capillary: 81 mg/dL (ref 70–99)
Glucose-Capillary: 102 mg/dL — ABNORMAL HIGH (ref 70–99)
Glucose-Capillary: 99 mg/dL (ref 70–99)
Glucose-Capillary: 141 mg/dL — ABNORMAL HIGH (ref 70–99)

## 2019-04-26 LAB — CBC
HCT: 39.2 % (ref 36.0–46.0)
Hemoglobin: 12.7 g/dL (ref 12.0–15.0)
MCH: 31.1 pg (ref 26.0–34.0)
MCHC: 32.4 g/dL (ref 30.0–36.0)
MCV: 95.8 fL (ref 80.0–100.0)
Platelets: 142 10*3/uL — ABNORMAL LOW (ref 150–400)
RBC: 4.09 MIL/uL (ref 3.87–5.11)
RDW: 13.7 % (ref 11.5–15.5)
WBC: 11.7 10*3/uL — ABNORMAL HIGH (ref 4.0–10.5)
nRBC: 0 % (ref 0.0–0.2)

## 2019-04-26 MED ORDER — ACETAMINOPHEN 500 MG PO TABS
500.0000 mg | ORAL_TABLET | Freq: Three times a day (TID) | ORAL | Status: DC
  Administered 2019-04-26 – 2019-05-01 (×16): 500 mg via ORAL
  Filled 2019-04-26 (×17): qty 1

## 2019-04-26 MED ORDER — ACETAMINOPHEN 325 MG PO TABS
650.0000 mg | ORAL_TABLET | Freq: Four times a day (QID) | ORAL | Status: DC | PRN
  Administered 2019-04-28 – 2019-04-30 (×2): 650 mg via ORAL
  Filled 2019-04-26 (×3): qty 2

## 2019-04-26 MED ORDER — BISOPROLOL FUMARATE 5 MG PO TABS
5.0000 mg | ORAL_TABLET | Freq: Every day | ORAL | Status: DC
  Administered 2019-04-27 – 2019-05-01 (×5): 5 mg via ORAL
  Filled 2019-04-26 (×5): qty 1

## 2019-04-26 MED ORDER — KETOROLAC TROMETHAMINE 15 MG/ML IJ SOLN: 15.0000 mg | Freq: Four times a day (QID) | INTRAMUSCULAR | Status: AC | PRN

## 2019-04-26 MED ORDER — OXYCODONE HCL 5 MG PO TABS
5.0000 mg | ORAL_TABLET | Freq: Four times a day (QID) | ORAL | Status: DC | PRN
  Administered 2019-04-27 – 2019-04-28 (×2): 5 mg via ORAL
  Filled 2019-04-26 (×2): qty 1

## 2019-04-26 MED ORDER — TRAMADOL HCL 50 MG PO TABS: 50.0000 mg | ORAL_TABLET | Freq: Four times a day (QID) | ORAL | Status: DC | PRN

## 2019-04-26 MED ORDER — ASPIRIN 81 MG PO CHEW
81.0000 mg | CHEWABLE_TABLET | Freq: Every day | ORAL | Status: DC
  Administered 2019-04-26 – 2019-05-01 (×6): 81 mg via ORAL
  Filled 2019-04-26 (×6): qty 1

## 2019-04-26 MED ORDER — OXYCODONE HCL 5 MG PO TABS: 2.5000 mg | ORAL_TABLET | ORAL | Status: DC | PRN

## 2019-04-26 MED ORDER — OXYCODONE HCL 5 MG PO TABS
2.5000 mg | ORAL_TABLET | Freq: Two times a day (BID) | ORAL | Status: DC
  Administered 2019-04-26 – 2019-05-01 (×10): 5 mg via ORAL
  Filled 2019-04-26 (×11): qty 1

## 2019-04-26 NOTE — Progress Notes (Signed)
[facility] PROGRESS NOTE  [name] JYN:[id number]:[id number] DOB: [birth year] DOA: 04/24/2019 PCP: Jarome MatinPaterson, Daniel, MD  Brief summary  [name] is a 84 y.o. female with medical history significant of aortic stenosis, COPD on chronic oxygen, rheumatoid arthritis, pulmonary fibrosis.,  Gait instability, anxiety disorder, adrenal insufficiency who lives at home and has had previous falls.  Patient was brought in by EMS today after sustaining a fall and hitting her right chest on the counter. Patient described as mechanical in nature.  She did not hit her head.  Did not have any loss of consciousness.    ED Course: Temperature is 98.6 blood pressure 168/78, pulse 101 respiratory of 26 oxygen sat 91% on nonrebreather bag.  White count 10.3 hemoglobin 11.8 and platelets 153.  Chemistry entirely within normal.  COVID-19's negative.  CT cervical spine and CT head without contrast showed no acute cranial abnormalities.  Multiple level degenerative change in the cervical spine is seen small right apical pneumothorax and a chest radiograph.  Chest x-ray showed 10% right apical pneumothorax.  Also showed acute displaced right seventh rib fracture and possible nondisplaced right ninth rib fracture.  Trauma surgery consulted.  Patient given a dose of Toradol and will be admitted to the hospital for continued care. Covid 19-negative   Assessment/Plan:  Fall. Right rib fractures. apical pneumothorax: Only 10%.  Trauma consulted. No indication for intervention.  Follow-up chest x-ray: no change. Continue monitoring. reportedly-multiple falls at home. Obtain pt/ot. may need rehab    Right sided new rib fractures (7th and 9th) due to fall: Patient appears to have old left-sided rib fracture also. Uncontrolled pain: Patient reports not well pain control on current regimen with tylenol. D/w agreed to start low dose oxy 2.5 BID+tylenol. + PRN. Cont incentive spirometry.   Rheumatoid arthritis: Continue home  regiment of treatment.  Diabetes: last ha1c-5.0. Sliding scale insulin.  Continue home regimen.  COPD: No evidence of exacerbation.  Continue oxygen and empiric treatment from home.  Chronic adrenal insufficiency: Continue home oral replacement.  Mild  elevated troponin. She denies left sided or central chest pains. She reports only right sided rib pain with chest wall tenderness. Will f/u echo. Start daily Asprin   COPD on chronic oxygen, pulmonary fibrosis. No s/s of acute exacerbation. Cont home regimen  Code Status: Full Family Communication:  D/w patient, Charity fundraiserN. Called updated her family member (indicate person spoken with, relationship, and if by phone, the number) Disposition Plan: pend clinical improvement    Consultants:  Surgery   Procedures:  none  Antibiotics: Anti-infectives (From admission, onward)   None        (indicate start date, and stop date if known)  HPI/Subjective: Reports right sided rib pains, not well controlled with current pain regimen. No acute SOb  Objective: Vitals:   04/26/19 0511 04/26/19 0754  BP: (!) 153/78 (!) 151/81  Pulse: (!) 114 (!) 112  Resp: 19 16  Temp: 99 F (37.2 C) 98.6 F (37 C)  SpO2: 92% 93%    Intake/Output Summary (Last 24 hours) at 04/26/2019 0921 Last data filed at 04/26/2019 0514 Gross per 24 hour  Intake 504.61 ml  Output 2180 ml  Net -1675.39 ml   Filed Weights   04/25/19 0653  Weight: 55.7 kg    Exam:   General:  No acute distress   Cardiovascular: s1, s2 rrr  Respiratory: diminished in R bases   Abdomen: soft, nt   Musculoskeletal: no leg edema    Data  Reviewed: Basic Metabolic Panel: Recent Labs  Lab 04/24/19 2309 04/25/19 0826 04/26/19 0413  NA 143 142 141  K 4.1 3.6 4.0  CL 107 104 103  CO2 27 29 27   GLUCOSE 108* 112* 93  BUN 21 13 8   CREATININE 0.73 0.70 0.52  CALCIUM 9.0 9.2 8.6*   Liver Function Tests: Recent Labs  Lab 04/25/19 0826  AST 16  ALT 13  ALKPHOS 61   BILITOT 0.8  PROT 5.7*  ALBUMIN 3.2*   No results for input(s): LIPASE, AMYLASE in the last 168 hours. No results for input(s): AMMONIA in the last 168 hours. CBC: Recent Labs  Lab 04/24/19 2309 04/25/19 0826 04/26/19 0413  WBC 10.3 9.8 11.7*  NEUTROABS 7.3  --   --   HGB 11.8* 13.3 12.7  HCT 38.1 42.2 39.2  MCV 99.0 97.7 95.8  PLT 153 148* 142*   Cardiac Enzymes: No results for input(s): CKTOTAL, CKMB, CKMBINDEX, TROPONINI in the last 168 hours. BNP (last 3 results) No results for input(s): BNP in the last 8760 hours.  ProBNP (last 3 results) No results for input(s): PROBNP in the last 8760 hours.  CBG: Recent Labs  Lab 04/25/19 0805 04/25/19 1115 04/25/19 1620 04/25/19 2130 04/26/19 0614  GLUCAP 98 113* 168* 88 81    Recent Results (from the past 240 hour(s))  SARS Coronavirus 2 (CEPHEID- Performed in [facility] lab), Hosp Order     Status: None   Collection Time: 04/25/19  1:33 AM   Specimen: Nasopharyngeal Swab  Result Value Ref Range Status   SARS Coronavirus 2 NEGATIVE NEGATIVE Final    Comment: (NOTE) If result is NEGATIVE SARS-CoV-2 target nucleic acids are NOT DETECTED. The SARS-CoV-2 RNA is generally detectable in upper and lower  respiratory specimens during the acute phase of infection. The lowest  concentration of SARS-CoV-2 viral copies this assay can detect is 250  copies / mL. A negative result does not preclude SARS-CoV-2 infection  and should not be used as the sole basis for treatment or other  patient management decisions.  A negative result may occur with  improper specimen collection / handling, submission of specimen other  than nasopharyngeal swab, presence of viral mutation(s) within the  areas targeted by this assay, and inadequate number of viral copies  (<250 copies / mL). A negative result must be combined with clinical  observations, patient history, and epidemiological information. If result is POSITIVE SARS-CoV-2  target nucleic acids are DETECTED. The SARS-CoV-2 RNA is generally detectable in upper and lower  respiratory specimens dur ing the acute phase of infection.  Positive  results are indicative of active infection with SARS-CoV-2.  Clinical  correlation with patient history and other diagnostic information is  necessary to determine patient infection status.  Positive results do  not rule out bacterial infection or co-infection with other viruses. If result is PRESUMPTIVE POSTIVE SARS-CoV-2 nucleic acids MAY BE PRESENT.   A presumptive positive result was obtained on the submitted specimen  and confirmed on repeat testing.  While 2019 novel coronavirus  (SARS-CoV-2) nucleic acids may be present in the submitted sample  additional confirmatory testing may be necessary for epidemiological  and / or clinical management purposes  to differentiate between  SARS-CoV-2 and other Sarbecovirus currently known to infect humans.  If clinically indicated additional testing with an alternate test  methodology [phone]([id number]) is advised. The SARS-CoV-2 RNA is generally  detectable in upper and lower respiratory sp ecimens during the acute  phase  of infection. The expected result is Negative. Fact Sheet for Patients:  BoilerBrush.com.cy Fact Sheet for Healthcare Providers: https://pope.com/ This test is not yet approved or cleared by the [place] FDA and has been authorized for detection and/or diagnosis of SARS-CoV-2 by FDA under an Emergency Use Authorization (EUA).  This EUA will remain in effect (meaning this test can be used) for the duration of the COVID-19 declaration under Section 564(b)(1) of the Act, 21 U.S.C. section 360bbb-3(b)(1), unless the authorization is terminated or revoked sooner. Performed at [facility], [address] [place], [place] [address]      Studies: Ct Head Wo Contrast  Result Date:  04/25/2019 CLINICAL DATA:  Fall landing on right side. EXAM: CT HEAD WITHOUT CONTRAST CT CERVICAL SPINE WITHOUT CONTRAST TECHNIQUE: Multidetector CT imaging of the head and cervical spine was performed following the standard protocol without intravenous contrast. Multiplanar CT image reconstructions of the cervical spine were also generated. COMPARISON:  Head CT 10/30/2017, head and cervical spine CT 10/14/2015 FINDINGS: CT HEAD FINDINGS Brain: No intracranial hemorrhage, mass effect, or midline shift. Stable degree of atrophy and chronic small vessel ischemia from prior exam. Small remote occipital infarct. No hydrocephalus. The basilar cisterns are patent. No evidence of territorial infarct or acute ischemia. No extra-axial or intracranial fluid collection. Vascular: Atherosclerosis of skullbase vasculature without hyperdense vessel or abnormal calcification. Skull: No fracture or focal lesion. Sinuses/Orbits: Paranasal sinuses and mastoid air cells are clear. The visualized orbits are unremarkable. Bilateral cataract resection. Other: None. CT CERVICAL SPINE FINDINGS Mild motion artifact. Alignment: Exaggerated cervical lordosis. No traumatic subluxation. Unchanged mild retrolisthesis of C3 on C4, degenerative. Skull base and vertebrae: No acute fracture. Dens and skull base are intact. None fusion posterior elements of C2, congenital. Slight loss of height of T1 vertebral body is chronic and unchanged. Soft tissues and spinal canal: No prevertebral fluid or swelling. No visible canal hematoma. Disc levels: Multilevel degenerative disc disease most prominent at C4-C5. Multilevel facet hypertrophy. Upper chest: Small right apically pneumothorax as seen on chest radiograph earlier this day. Other: Carotid calcifications. IMPRESSION: 1. No acute intracranial abnormality. No skull fracture. Stable atrophy and chronic small vessel ischemia. 2. Multilevel degenerative change in the cervical spine without acute fracture  or subluxation. 3. Small right apical pneumothorax as seen on chest radiograph earlier this day. Electronically Signed   By: Narda Rutherford M.D.   On: 04/25/2019 01:42   Ct Cervical Spine Wo Contrast  Result Date: 04/25/2019 CLINICAL DATA:  Fall landing on right side. EXAM: CT HEAD WITHOUT CONTRAST CT CERVICAL SPINE WITHOUT CONTRAST TECHNIQUE: Multidetector CT imaging of the head and cervical spine was performed following the standard protocol without intravenous contrast. Multiplanar CT image reconstructions of the cervical spine were also generated. COMPARISON:  Head CT 10/30/2017, head and cervical spine CT 10/14/2015 FINDINGS: CT HEAD FINDINGS Brain: No intracranial hemorrhage, mass effect, or midline shift. Stable degree of atrophy and chronic small vessel ischemia from prior exam. Small remote occipital infarct. No hydrocephalus. The basilar cisterns are patent. No evidence of territorial infarct or acute ischemia. No extra-axial or intracranial fluid collection. Vascular: Atherosclerosis of skullbase vasculature without hyperdense vessel or abnormal calcification. Skull: No fracture or focal lesion. Sinuses/Orbits: Paranasal sinuses and mastoid air cells are clear. The visualized orbits are unremarkable. Bilateral cataract resection. Other: None. CT CERVICAL SPINE FINDINGS Mild motion artifact. Alignment: Exaggerated cervical lordosis. No traumatic subluxation. Unchanged mild retrolisthesis of C3 on C4, degenerative. Skull base and vertebrae: No acute  fracture. Dens and skull base are intact. None fusion posterior elements of C2, congenital. Slight loss of height of T1 vertebral body is chronic and unchanged. Soft tissues and spinal canal: No prevertebral fluid or swelling. No visible canal hematoma. Disc levels: Multilevel degenerative disc disease most prominent at C4-C5. Multilevel facet hypertrophy. Upper chest: Small right apically pneumothorax as seen on chest radiograph earlier this day. Other:  Carotid calcifications. IMPRESSION: 1. No acute intracranial abnormality. No skull fracture. Stable atrophy and chronic small vessel ischemia. 2. Multilevel degenerative change in the cervical spine without acute fracture or subluxation. 3. Small right apical pneumothorax as seen on chest radiograph earlier this day. Electronically Signed   By: Narda Rutherford M.D.   On: 04/25/2019 01:42   Dg Chest Port 1 View  Result Date: 04/26/2019 CLINICAL DATA:  Pneumothorax. EXAM: PORTABLE CHEST 1 VIEW COMPARISON:  04/25/2019. FINDINGS: Mediastinum hilar structures normal. Heart size stable. Aortic stent graft in stable position. Left base atelectasis and tiny bilateral pleural effusions unchanged. Small right apical pneumothorax unchanged. Right posterior rib fracture unchanged. IMPRESSION: 1. Small right apical pneumothorax unchanged. Unchanged right posterior rib fracture. 2. Left base atelectasis and tiny bilateral pleural effusions unchanged. Electronically Signed   By: Maisie Fus  Register   On: 04/26/2019 06:16   Dg Chest Port 1 View  Result Date: 04/25/2019 CLINICAL DATA:  Larey Seat. Right-sided chest pain. Follow-up pneumothorax. EXAM: PORTABLE CHEST 1 VIEW COMPARISON:  04/24/2019 FINDINGS: The heart is mildly enlarged but stable. Stable tortuosity and ectasia and calcification of the thoracic aorta. Stable TAVR. There is a stable small right apical pneumothorax estimated at 5-10%. Stable eventration of left hemidiaphragm with overlying scarring changes and mild pleural thickening. Stable right posterior rib fractures. IMPRESSION: 1. Stable 5-10% right apical pneumothorax.  No progressive findings. 2. Stable left basilar density, combination of eventration left hemidiaphragm, overlying pulmonary scarring changes and mild left pleural thickening. Electronically Signed   By: Rudie Meyer M.D.   On: 04/25/2019 13:01   Dg Chest Portable 1 View  Result Date: 04/25/2019 CLINICAL DATA:  Fall diminished lung sounds on the  right side EXAM: PORTABLE CHEST 1 VIEW COMPARISON:  11/20/2017, 10/30/2017 FINDINGS: Hazy opacity at the right lung base. Possible focal airspace disease or tiny effusion at the left base. Borderline cardiomegaly. Aortic graft similar in position. Aortic atherosclerosis. Small right apical pneumothorax, demonstrating 12 mm pleural-parenchymal separation at the apex, estimated at 10%. Acute right seventh displaced rib fracture. Possible nondisplaced right ninth rib fracture. Negative for midline shift. IMPRESSION: 1. Small right apical pneumothorax, estimated at 10% 2. Acute displaced right seventh rib fracture and possible nondisplaced right ninth rib fracture Critical Value/emergent results were called by telephone at the time of interpretation on 04/25/2019 at 12:38 am to Dr. Cy Blamer , who verbally acknowledged these results. Electronically Signed   By: Jasmine Pang M.D.   On: 04/25/2019 00:38   Vas US Carotid  Result Date: 04/25/2019 Carotid Arterial Duplex Study Indications:       Falls. Other Factors:     Aoritic stenosis. COPD on Home O2. Comparison Study:  No prior study on file for comparison. Performing Technologist: Sherren Kerns RVS  Examination Guidelines: A complete evaluation includes B-mode imaging, spectral Doppler, color Doppler, and power Doppler as needed of all accessible portions of each vessel. Bilateral testing is considered an integral part of a complete examination. Limited examinations for reoccurring indications may be performed as noted.  Right Carotid Findings: +----------+--------+--------+--------+------------+------------------+  PSV cm/sEDV cm/sStenosisDescribe    Comments           +----------+--------+--------+--------+------------+------------------+ CCA Prox  82      15                          intimal thickening +----------+--------+--------+--------+------------+------------------+ CCA Distal71      16                          intimal  thickening +----------+--------+--------+--------+------------+------------------+ ICA Prox  58      22              heterogenous                   +----------+--------+--------+--------+------------+------------------+ ICA Distal68      21                                             +----------+--------+--------+--------+------------+------------------+ ECA       65      4                                              +----------+--------+--------+--------+------------+------------------+ +----------+--------+-------+--------+-------------------+           PSV cm/sEDV cmsDescribeArm Pressure (mmHG) +----------+--------+-------+--------+-------------------+ [id number]                                         +----------+--------+-------+--------+-------------------+ +---------+--------+--+--------+--+ VertebralPSV cm/s48EDV cm/s14 +---------+--------+--+--------+--+  Left Carotid Findings: +----------+--------+--------+--------+--------+------------------+           PSV cm/sEDV cm/sStenosisDescribeComments           +----------+--------+--------+--------+--------+------------------+ CCA Prox  63      13                      intimal thickening +----------+--------+--------+--------+--------+------------------+ CCA Distal59      16                      intimal thickening +----------+--------+--------+--------+--------+------------------+ ICA Prox  45      11              calcific                   +----------+--------+--------+--------+--------+------------------+ ECA       68      2                                          +----------+--------+--------+--------+--------+------------------+ +----------+--------+--------+--------+-------------------+ SubclavianPSV cm/sEDV cm/sDescribeArm Pressure (mmHG) +----------+--------+--------+--------+-------------------+           82                                           +----------+--------+--------+--------+-------------------+ +---------+--------+--+--------+--+ VertebralPSV cm/s47EDV cm/s13 +---------+--------+--+--------+--+  Summary: Right Carotid: The extracranial vessels were near-normal with only minimal wall  thickening or plaque. Left Carotid: The extracranial vessels were near-normal with only minimal wall               thickening or plaque. Vertebrals:  Bilateral vertebral arteries demonstrate antegrade flow. Subclavians: Normal flow hemodynamics were seen in bilateral subclavian              arteries. *See table(s) above for measurements and observations.     Preliminary     Scheduled Meds: . ALPRAZolam  0.5 mg Oral BID  . bisoprolol  2.5 mg Oral Daily  . buPROPion  100 mg Oral q morning - 10a  . cholecalciferol  1,000 Units Oral Daily  . fentaNYL (SUBLIMAZE) injection  50 mcg Intravenous Once  . heparin  5,000 Units Subcutaneous Q8H  . insulin aspart  0-5 Units Subcutaneous QHS  . insulin aspart  0-9 Units Subcutaneous TID WC  . latanoprost  1 drop Both Eyes QHS  . leflunomide  20 mg Oral Daily  . lidocaine  2 patch Transdermal Q24H  . mirtazapine  15 mg Oral QHS  . nortriptyline  25 mg Oral QHS  . predniSONE  5 mg Oral BID WC  . vitamin B-12  1,000 mcg Oral Daily   Continuous Infusions: . sodium chloride 75 mL/hr at 04/25/19 2008    Principal Problem:   Pneumothorax Active Problems:   Weakness generalized   Rheumatoid arthritis involving multiple joints (HCC)   DM2 (diabetes mellitus, type 2) (HCC)   Chronic adrenal insufficiency (HCC)   Pulmonary fibrosis (HCC)   COPD (chronic obstructive pulmonary disease) (HCC)   S/P TAVR (transcatheter aortic valve replacement)   Multiple fractures of ribs, right side, initial encounter for closed fracture   Fall at home, initial encounter    Time spent: >35 minutes     Kinnie Feil  [facility] Pager [phone]. If 7PM-7AM, please contact night-coverage at  [email or web address], password [facility] 04/26/2019, 9:21 AM  LOS: 1 day

## 2019-04-26 NOTE — Evaluation (Signed)
Physical Therapy Evaluation Patient Details Name: [name] MRN: [id number] DOB: 08/31/1935 Today's Date: 04/26/2019   History of Present Illness  84 yo admitted after fall with Rt 7-8 rib fx with small PTX. PMhx: aortic stenosis, COPD on chronic oxygen, rheumatoid arthritis, pulmonary fibrosis.,  Gait instability, anxiety disorder, adrenal insufficiency  Clinical Impression  Pt supine on arrival and fearful of mobility due to chest soreness with rib fx. Pt demonstrates decreased strength, balance, transfers and gait limited by pain and fear of falling who will benefit from acute therapy to maximize mobility, function and independence to decrease burden of care. Pt encouraged to be OOB for meals and increase mobility with nursing including transfers to [facility]. Pt lives alone and currently requires assist for all mobility and is not able to care for herself. Pt states family can assist but if not then SNF recommended. Pt reports feeling like she will fall with transition to sitting without nystagmus, no ability to further clarify sensation of spinning, dizziness, room darkening with VSS.   BP supine 150/82, HR 114, SpO 2 94% on RA Sitting 152/106 with HR 120 SpO2 88-94% on RA with pt maintaining 95% on 1L end of session    Follow Up Recommendations SNF;Supervision/Assistance - 24 hour;Home health PT(if family agreeable to 24hr assist HHPT possible but otherwise SNF)    Equipment Recommendations  None recommended by PT    Recommendations for Other Services       Precautions / Restrictions Precautions Precautions: Fall      Mobility  Bed Mobility Overal bed mobility: Needs Assistance Bed Mobility: Rolling;Sidelying to Sit Rolling: Min assist Sidelying to sit: Mod assist       General bed mobility comments: physical assist to roll to left and elevate trunk with cues for sequence, assist to bring legs off of bed and increased time. Cues for breathing technique due to  pain  Transfers Overall transfer level: Needs assistance   Transfers: Sit to/from Stand Sit to Stand: Min assist         General transfer comment: assist to rise from bed with pt with slight incontinence on standing, sat at bed then stood and pivoted to [facility] prior to 3rd stand and walk to chair. Bil UE support on RW for pericare  Ambulation/Gait Ambulation/Gait assistance: Min assist Gait Distance (Feet): 12 Feet Assistive device: Rolling walker (2 wheeled) Gait Pattern/deviations: Step-through pattern;Decreased stride length;Trunk flexed   Gait velocity interpretation: <1.8 ft/sec, indicate of risk for recurrent falls General Gait Details: cues for posture, hand placement on RW and safety  Stairs            Wheelchair Mobility    Modified Rankin (Stroke Patients Only)       Balance Overall balance assessment: History of Falls                                           Pertinent Vitals/Pain Pain Assessment: 0-10 Pain Score: 7  Pain Location: left chest Pain Descriptors / Indicators: Grimacing;Aching;Sore Pain Intervention(s): Limited activity within patient's tolerance;Repositioned;Monitored during session;Premedicated before session;Patient requesting pain meds-RN notified    Home Living Family/patient expects to be discharged to:: Private residence Living Arrangements: Alone Available Help at Discharge: Family;Available PRN/intermittently Type of Home: House Home Access: Stairs to enter Entrance Stairs-Rails: None Entrance Stairs-Number of Steps: 3 Home Layout: One level Home Equipment: Walker - 4 wheels;Shower seat;Bedside  commode      Prior Function Level of Independence: Needs assistance   Gait / Transfers Assistance Needed: walks with walker  ADL's / Homemaking Assistance Needed: daughter helps with groceries and bathing, dressing        Hand Dominance        Extremity/Trunk Assessment   Upper Extremity Assessment Upper  Extremity Assessment: Generalized weakness    Lower Extremity Assessment Lower Extremity Assessment: Generalized weakness    Cervical / Trunk Assessment Cervical / Trunk Assessment: Kyphotic  Communication   Communication: HOH  Cognition Arousal/Alertness: Awake/alert Behavior During Therapy: Flat affect Overall Cognitive Status: Impaired/Different from baseline Area of Impairment: Memory                     Memory: Decreased short-term memory         General Comments: pt with difficulty problem solving current deficits and function related to baseline      General Comments      Exercises     Assessment/Plan    PT Assessment Patient needs continued PT services  PT Problem List Decreased strength;Decreased mobility;Decreased safety awareness;Decreased activity tolerance;Decreased balance;Decreased knowledge of use of DME       PT Treatment Interventions DME instruction;Therapeutic activities;Gait training;Therapeutic exercise;Cognitive remediation;Patient/family education;Balance training;Functional mobility training;Stair training    PT Goals (Current goals can be found in the Care Plan section)  Acute Rehab PT Goals Patient Stated Goal: return home with help of family PT Goal Formulation: With patient Time For Goal Achievement: 05/10/19 Potential to Achieve Goals: Fair    Frequency Min 3X/week   Barriers to discharge Decreased caregiver support      Co-evaluation               AM-PAC PT "6 Clicks" Mobility  Outcome Measure Help needed turning from your back to your side while in a flat bed without using bedrails?: A Little Help needed moving from lying on your back to sitting on the side of a flat bed without using bedrails?: A Little Help needed moving to and from a bed to a chair (including a wheelchair)?: A Little Help needed standing up from a chair using your arms (e.g., wheelchair or bedside chair)?: A Little Help needed to walk in  hospital room?: A Little Help needed climbing 3-5 steps with a railing? : A Lot 6 Click Score: 17    End of Session Equipment Utilized During Treatment: Gait belt;Oxygen Activity Tolerance: Patient tolerated treatment well;Patient limited by pain Patient left: in chair;with call bell/phone within reach;with chair alarm set Nurse Communication: Mobility status;Precautions PT Visit Diagnosis: Other abnormalities of gait and mobility (R26.89);Muscle weakness (generalized) (M62.81);Unsteadiness on feet (R26.81);History of falling (Z91.81)    Time: 3570-1779 PT Time Calculation (min) (ACUTE ONLY): 35 min   Charges:   PT Evaluation $PT Eval Moderate Complexity: 1 Mod PT Treatments $Therapeutic Activity: 8-22 mins        Tylique Aull Abner Greenspan, PT Acute Rehabilitation Services Pager: [phone] Office: [phone]   Herman Fiero B Elsia [place] 04/26/2019, 12:20 PM

## 2019-04-26 NOTE — TOC Initial Note (Signed)
Transition of Care [facility]) - Initial/Assessment Note    Patient Details  Name: [name] MRN: [id number] Date of Birth: 1935/07/26  Transition of Care [facility]) CM/SW Contact:    Gelene Mink, [place] Phone Number: 04/26/2019, 5:21 PM  Clinical Narrative:                  CSW met with the patient at bedside. CSW introduced herself and explained her role. CSW went over therapy recommendation. The patient stated she has been to SNF in the past. She has stayed at [facility]. The patient stated that she has used [place] and [place] for therapy. The patient stated that she felt that she did not need any therapy. CSW stated that it wasn't safe for her to go home without any type of therapy and CSW stated that with her history of falls, SNF might be her best option since she lived alone.   The patient stated that her daughter [name] assists her with cleaning and getting her groceries. The patient stated that her daughter takes her to her appointments. The patient began talking about her neighbor and how she assists her with getting up with her falls. She stated that her neighbor has a bad hip but is always there for her. CSW circled back around to the topic of conversation. CSW asked if she could call the patient's daughter to get her input. The patient agreed. CSW attempted to contact the patient's daughter about the therapy recommendation. CSW left a voicemail. CSW asked the patient again what she would like to do, she stated that she wanted to go home. CSW asked what home health agency she would like to use, the patient didn't answer. CSW asked if the patient wanted to think about SNF vs. HH and she would follow up with her tomorrow. CSW informed her to discuss it with her daughter and CSW would assist with disposition decision.   CSW will continue to follow.    Expected Discharge Plan: (Patient is going back and forth between home health and SNF) Barriers to Discharge: Continued Medical Work  up   Patient Goals and CMS Choice Patient states their goals for this hospitalization and ongoing recovery are:: Pt is going back and forth between skilled nursing and home health      Expected Discharge Plan and Services Expected Discharge Plan: (Patient is going back and forth between home health and SNF) In-house Referral: Clinical Social Work     Living arrangements for the past 2 months: Single Family Home                                      Prior Living Arrangements/Services Living arrangements for the past 2 months: Single Family Home Lives with:: Self Patient language and need for interpreter reviewed:: No Do you feel safe going back to the place where you live?: Yes      Need for Family Participation in Patient Care: Yes (Comment) Care giver support system in place?: Yes (comment) Current home services: (NA) Criminal Activity/Legal Involvement Pertinent to Current Situation/Hospitalization: No - Comment as needed  Activities of Daily Living      Permission Sought/Granted Permission sought to share information with : Case Manager Permission granted to share information with : Yes, Verbal Permission Granted  Share Information with NAME: [name]     Permission granted to share info w Relationship: Daughter  Emotional Assessment Appearance:: Appears stated age Attitude/Demeanor/Rapport: Engaged Affect (typically observed): Calm Orientation: : Oriented to Self, Oriented to Place, Oriented to  Time, Oriented to Situation Alcohol / Substance Use: Not Applicable Psych Involvement: No (comment)  Admission diagnosis:  Traumatic pneumothorax, initial encounter [S27.0XXA] Fall, initial encounter [id number]] Closed fracture of multiple ribs of right side, initial encounter [S22.41XA] Patient Active Problem List   Diagnosis Date Noted  . Pneumothorax 04/25/2019  . Multiple fractures of ribs, right side, initial encounter for closed fracture 04/25/2019  .  Fall at home, initial encounter 04/25/2019  . SIRS (systemic inflammatory response syndrome) ([place]) 11/20/2017  . HCAP (healthcare-associated pneumonia) 10/30/2017  . Fracture of three ribs, left, closed, initial encounter 10/30/2017  . Sepsis associated hypotension ([place]) 10/19/2017  . Elevated troponin 10/19/2017  . Dizziness 07/24/2016  . Dyspnea 12/06/2015  . Hypoxia 12/06/2015  . Hypokalemia 10/21/2015  . Hypophosphatemia 10/21/2015  . Hypomagnesemia 10/21/2015  . CAP (community acquired pneumonia) 10/20/2015  . B12 deficiency   . Malnutrition of moderate degree ([place]) 03/18/2015  . Altered mental state 03/17/2015  . Metabolic encephalopathy 94/32/7614    Class: Acute  . Sepsis secondary to UTI ([place]) 09/09/2014  . S/P TAVR (transcatheter aortic valve replacement) 03/16/2014  . Left rib fracture 11/17/2013  . Closed pelvic fracture ([place]) 11/17/2013  . Sepsis ([place]) 11/10/2013  . UTI (lower urinary tract infection) 11/09/2013  . Sepsis syndrome 09/17/2013  . COPD (chronic obstructive pulmonary disease) ([place]) 07/20/2013  . Chest pain 06/29/2013  . Hearing loss 03/25/2013  . Memory deficit 03/25/2013  . Depression 03/25/2013  . Syncope 03/25/2013  . Pulmonary fibrosis ([place])   . Aortic stenosis, severe 02/26/2013  . Pancreatic insufficiency (HCC) 11/09/2012    Class: Chronic  . Low back pain 01/16/2012    Class: Acute  . Gait instability 01/16/2012    Class: Acute  . Sinusitis, acute, sphenoidal 11/17/2011    Class: Acute  . Pneumonia 11/12/2011    Class: Acute  . Chronic adrenal insufficiency (HCC) 11/12/2011    Class: Chronic  . E. coli UTI (urinary tract infection) 10/01/2011  . Fever 10/01/2011  . Cough 10/01/2011  . Failure to thrive in adult 10/01/2011  . Weakness generalized 10/01/2011  . Rheumatoid arthritis involving multiple joints ([place]) 10/01/2011  . DM2 (diabetes mellitus, type 2) ([place]) 10/01/2011   PCP:  [name], MD Pharmacy:   [id number] [place] [address]Phone: [phone]: [phone] [id number] [address]  [id number]: [phone]: [phone]   Social Determinants of Health (SDOH) Interventions    Readmission Risk Interventions No flowsheet data found.

## 2019-04-26 NOTE — Progress Notes (Signed)
  Echocardiogram 2D Echocardiogram has been performed.  [name] 04/26/2019, 10:03 AM

## 2019-04-26 NOTE — Progress Notes (Signed)
Patient ID: [name], female   DOB: [birth year], 84 y.o.   MRN: [id number]       Subjective: Laying in bed sleeping.  Woke her up and c/o mild nausea, but ate yesterday.  Some chest pain.  Hasn't gotten out of bed yet since admission.  Objective: Vital signs in last 24 hours: Temp:  [97.7 F (36.5 C)-99 F (37.2 C)] 98.6 F (37 C) (07/06 0754) Pulse Rate:  [74-114] 112 (07/06 0754) Resp:  [16-28] 16 (07/06 0754) BP: (151-169)/(72-86) 151/81 (07/06 0754) SpO2:  [92 %-99 %] 93 % (07/06 0754) Last BM Date: (P) 04/24/19  Intake/Output from previous day: 07/05 0701 - 07/06 0700 In: 744.6 [P.O.:240; I.V.:504.6] Out: 2180 [Urine:2180] Intake/Output this shift: No intake/output data recorded.  PE: Gen: NAD Heart: regular, mildly tachy Lungs: CTAB, chest wall tenderness as expected Abd: soft, minimally tender on right side, no guarding or rebound, ND, +BS Ext: MAE  Lab Results:  Recent Labs    04/25/19 0826 04/26/19 0413  WBC 9.8 11.7*  HGB 13.3 12.7  HCT 42.2 39.2  PLT 148* 142*   BMET Recent Labs    04/25/19 0826 04/26/19 0413  NA 142 141  K 3.6 4.0  CL 104 103  CO2 29 27  GLUCOSE 112* 93  BUN 13 8  CREATININE 0.70 0.52  CALCIUM 9.2 8.6*   PT/INR No results for input(s): LABPROT, INR in the last 72 hours. CMP     Component Value Date/Time   NA 141 04/26/2019 0413   K 4.0 04/26/2019 0413   CL 103 04/26/2019 0413   CO2 27 04/26/2019 0413   GLUCOSE 93 04/26/2019 0413   BUN 8 04/26/2019 0413   CREATININE 0.52 04/26/2019 0413   CREATININE 0.64 03/23/2014 1429   CALCIUM 8.6 (L) 04/26/2019 0413   CALCIUM 8.6 11/15/2011 0445   PROT 5.7 (L) 04/25/2019 0826   ALBUMIN 3.2 (L) 04/25/2019 0826   AST 16 04/25/2019 0826   ALT 13 04/25/2019 0826   ALKPHOS 61 04/25/2019 0826   BILITOT 0.8 04/25/2019 0826   GFRNONAA >60 04/26/2019 0413   GFRAA >60 04/26/2019 0413   Lipase     Component Value Date/Time   LIPASE 25 10/01/2011 1140        Studies/Results: Ct Head Wo Contrast  Result Date: 04/25/2019 CLINICAL DATA:  Fall landing on right side. EXAM: CT HEAD WITHOUT CONTRAST CT CERVICAL SPINE WITHOUT CONTRAST TECHNIQUE: Multidetector CT imaging of the head and cervical spine was performed following the standard protocol without intravenous contrast. Multiplanar CT image reconstructions of the cervical spine were also generated. COMPARISON:  Head CT 10/30/2017, head and cervical spine CT 10/14/2015 FINDINGS: CT HEAD FINDINGS Brain: No intracranial hemorrhage, mass effect, or midline shift. Stable degree of atrophy and chronic small vessel ischemia from prior exam. Small remote occipital infarct. No hydrocephalus. The basilar cisterns are patent. No evidence of territorial infarct or acute ischemia. No extra-axial or intracranial fluid collection. Vascular: Atherosclerosis of skullbase vasculature without hyperdense vessel or abnormal calcification. Skull: No fracture or focal lesion. Sinuses/Orbits: Paranasal sinuses and mastoid air cells are clear. The visualized orbits are unremarkable. Bilateral cataract resection. Other: None. CT CERVICAL SPINE FINDINGS Mild motion artifact. Alignment: Exaggerated cervical lordosis. No traumatic subluxation. Unchanged mild retrolisthesis of C3 on C4, degenerative. Skull base and vertebrae: No acute fracture. Dens and skull base are intact. None fusion posterior elements of C2, congenital. Slight loss of height of T1 vertebral body is chronic and unchanged. Soft tissues and  spinal canal: No prevertebral fluid or swelling. No visible canal hematoma. Disc levels: Multilevel degenerative disc disease most prominent at C4-C5. Multilevel facet hypertrophy. Upper chest: Small right apically pneumothorax as seen on chest radiograph earlier this day. Other: Carotid calcifications. IMPRESSION: 1. No acute intracranial abnormality. No skull fracture. Stable atrophy and chronic small vessel ischemia. 2. Multilevel  degenerative change in the cervical spine without acute fracture or subluxation. 3. Small right apical pneumothorax as seen on chest radiograph earlier this day. Electronically Signed   By: Narda Rutherford M.D.   On: 04/25/2019 01:42   Ct Cervical Spine Wo Contrast  Result Date: 04/25/2019 CLINICAL DATA:  Fall landing on right side. EXAM: CT HEAD WITHOUT CONTRAST CT CERVICAL SPINE WITHOUT CONTRAST TECHNIQUE: Multidetector CT imaging of the head and cervical spine was performed following the standard protocol without intravenous contrast. Multiplanar CT image reconstructions of the cervical spine were also generated. COMPARISON:  Head CT 10/30/2017, head and cervical spine CT 10/14/2015 FINDINGS: CT HEAD FINDINGS Brain: No intracranial hemorrhage, mass effect, or midline shift. Stable degree of atrophy and chronic small vessel ischemia from prior exam. Small remote occipital infarct. No hydrocephalus. The basilar cisterns are patent. No evidence of territorial infarct or acute ischemia. No extra-axial or intracranial fluid collection. Vascular: Atherosclerosis of skullbase vasculature without hyperdense vessel or abnormal calcification. Skull: No fracture or focal lesion. Sinuses/Orbits: Paranasal sinuses and mastoid air cells are clear. The visualized orbits are unremarkable. Bilateral cataract resection. Other: None. CT CERVICAL SPINE FINDINGS Mild motion artifact. Alignment: Exaggerated cervical lordosis. No traumatic subluxation. Unchanged mild retrolisthesis of C3 on C4, degenerative. Skull base and vertebrae: No acute fracture. Dens and skull base are intact. None fusion posterior elements of C2, congenital. Slight loss of height of T1 vertebral body is chronic and unchanged. Soft tissues and spinal canal: No prevertebral fluid or swelling. No visible canal hematoma. Disc levels: Multilevel degenerative disc disease most prominent at C4-C5. Multilevel facet hypertrophy. Upper chest: Small right apically  pneumothorax as seen on chest radiograph earlier this day. Other: Carotid calcifications. IMPRESSION: 1. No acute intracranial abnormality. No skull fracture. Stable atrophy and chronic small vessel ischemia. 2. Multilevel degenerative change in the cervical spine without acute fracture or subluxation. 3. Small right apical pneumothorax as seen on chest radiograph earlier this day. Electronically Signed   By: Narda Rutherford M.D.   On: 04/25/2019 01:42   Dg Chest Port 1 View  Result Date: 04/26/2019 CLINICAL DATA:  Pneumothorax. EXAM: PORTABLE CHEST 1 VIEW COMPARISON:  04/25/2019. FINDINGS: Mediastinum hilar structures normal. Heart size stable. Aortic stent graft in stable position. Left base atelectasis and tiny bilateral pleural effusions unchanged. Small right apical pneumothorax unchanged. Right posterior rib fracture unchanged. IMPRESSION: 1. Small right apical pneumothorax unchanged. Unchanged right posterior rib fracture. 2. Left base atelectasis and tiny bilateral pleural effusions unchanged. Electronically Signed   By: Maisie Fus  Register   On: 04/26/2019 06:16   Dg Chest Port 1 View  Result Date: 04/25/2019 CLINICAL DATA:  Larey Seat. Right-sided chest pain. Follow-up pneumothorax. EXAM: PORTABLE CHEST 1 VIEW COMPARISON:  04/24/2019 FINDINGS: The heart is mildly enlarged but stable. Stable tortuosity and ectasia and calcification of the thoracic aorta. Stable TAVR. There is a stable small right apical pneumothorax estimated at 5-10%. Stable eventration of left hemidiaphragm with overlying scarring changes and mild pleural thickening. Stable right posterior rib fractures. IMPRESSION: 1. Stable 5-10% right apical pneumothorax.  No progressive findings. 2. Stable left basilar density, combination of eventration left hemidiaphragm, overlying pulmonary  scarring changes and mild left pleural thickening. Electronically Signed   By: Marijo Sanes M.D.   On: 04/25/2019 13:01   Dg Chest Portable 1 View  Result  Date: 04/25/2019 CLINICAL DATA:  Fall diminished lung sounds on the right side EXAM: PORTABLE CHEST 1 VIEW COMPARISON:  11/20/2017, 10/30/2017 FINDINGS: Hazy opacity at the right lung base. Possible focal airspace disease or tiny effusion at the left base. Borderline cardiomegaly. Aortic graft similar in position. Aortic atherosclerosis. Small right apical pneumothorax, demonstrating 12 mm pleural-parenchymal separation at the apex, estimated at 10%. Acute right seventh displaced rib fracture. Possible nondisplaced right ninth rib fracture. Negative for midline shift. IMPRESSION: 1. Small right apical pneumothorax, estimated at 10% 2. Acute displaced right seventh rib fracture and possible nondisplaced right ninth rib fracture Critical Value/emergent results were called by telephone at the time of interpretation on 04/25/2019 at 12:38 am to Dr. Veatrice Kells , who verbally acknowledged these results. Electronically Signed   By: Donavan Foil M.D.   On: 04/25/2019 00:38   Vas US Carotid  Result Date: 04/25/2019 Carotid Arterial Duplex Study Indications:       Falls. Other Factors:     Aoritic stenosis. COPD on Home O2. Comparison Study:  No prior study on file for comparison. Performing Technologist: Sharion Dove RVS  Examination Guidelines: A complete evaluation includes B-mode imaging, spectral Doppler, color Doppler, and power Doppler as needed of all accessible portions of each vessel. Bilateral testing is considered an integral part of a complete examination. Limited examinations for reoccurring indications may be performed as noted.  Right Carotid Findings: +----------+--------+--------+--------+------------+------------------+             PSV cm/s EDV cm/s Stenosis Describe     Comments            +----------+--------+--------+--------+------------+------------------+  CCA Prox   82       15                             intimal thickening   +----------+--------+--------+--------+------------+------------------+  CCA Distal 71       16                             intimal thickening  +----------+--------+--------+--------+------------+------------------+  ICA Prox   58       22                heterogenous                     +----------+--------+--------+--------+------------+------------------+  ICA Distal 68       21                                                 +----------+--------+--------+--------+------------+------------------+  ECA        65       4                                                  +----------+--------+--------+--------+------------+------------------+ +----------+--------+-------+--------+-------------------+             PSV cm/s EDV cms Describe Arm Pressure (mmHG)  +----------+--------+-------+--------+-------------------+  Subclavian 44                                             +----------+--------+-------+--------+-------------------+ +---------+--------+--+--------+--+  Vertebral PSV cm/s 48 EDV cm/s 14  +---------+--------+--+--------+--+  Left Carotid Findings: +----------+--------+--------+--------+--------+------------------+             PSV cm/s EDV cm/s Stenosis Describe Comments            +----------+--------+--------+--------+--------+------------------+  CCA Prox   63       13                         intimal thickening  +----------+--------+--------+--------+--------+------------------+  CCA Distal 59       16                         intimal thickening  +----------+--------+--------+--------+--------+------------------+  ICA Prox   45       11                calcific                     +----------+--------+--------+--------+--------+------------------+  ECA        68       2                                              +----------+--------+--------+--------+--------+------------------+ +----------+--------+--------+--------+-------------------+  Subclavian PSV cm/s EDV cm/s Describe Arm Pressure (mmHG)   +----------+--------+--------+--------+-------------------+             82                                              +----------+--------+--------+--------+-------------------+ +---------+--------+--+--------+--+  Vertebral PSV cm/s 47 EDV cm/s 13  +---------+--------+--+--------+--+  Summary: Right Carotid: The extracranial vessels were near-normal with only minimal wall                thickening or plaque. Left Carotid: The extracranial vessels were near-normal with only minimal wall               thickening or plaque. Vertebrals:  Bilateral vertebral arteries demonstrate antegrade flow. Subclavians: Normal flow hemodynamics were seen in bilateral subclavian              arteries. *See table(s) above for measurements and observations.     Preliminary     Anti-infectives: Anti-infectives (From admission, onward)   None       Assessment/Plan S/p fall Right rib fx x2 - pulm toilet, pain control, IS, mobilize with PT/OT Small right pneumothorax - stable, still very small. RA - per medicine Diabetes mellitus 2 - per medicine COPD - pt states she is not on home o2, on 2L here Chronic adrenal insufficiency - per medicine FEN - carb mod diet VTE - heparin ID - none   LOS: 1 day    Letha Cape , [facility] 04/26/2019, 8:06 AM Pager: [phone]

## 2019-04-26 NOTE — Evaluation (Signed)
Occupational Therapy Evaluation Patient Details Name: [name] MRN: [id number] DOB: 1935-02-27 Today's Date: 04/26/2019    History of Present Illness 84 yo admitted after fall with Rt 7-8 rib fx with small PTX. PMhx: aortic stenosis, COPD on chronic oxygen, rheumatoid arthritis, pulmonary fibrosis.,  Gait instability, anxiety disorder, adrenal insufficiency   Clinical Impression   This 84 y/o female presents with the above. PTA pt reports she is mod independent with functional mobility using RW, receives intermittent assist from daughter for ADL/iADL needs. Pt presenting with R side pain, generalized weakness. She currently requires minA for functional transfers, minA for seated UB ADL and maxA for LB ADL. Pt on 1L O2 during session with O2 sats maintaining >90%. Pt is easily distracted this session requiring cues to maintain focus on task at hand. She will benefit from continued acute OT services and recommend post acute rehab services in SNF setting, (pending availability of 24hr assist at home) to maximize her safety and independence with ADL and mobility. Will follow.     Follow Up Recommendations  SNF;Supervision/Assistance - 24 hour(SNF vs 24hr)    Equipment Recommendations  None recommended by OT           Precautions / Restrictions Precautions Precautions: Fall Restrictions Weight Bearing Restrictions: No      Mobility Bed Mobility Overal bed mobility: Needs Assistance Bed Mobility: Rolling;Sidelying to Sit;Sit to Sidelying Rolling: Min assist Sidelying to sit: Min assist     Sit to sidelying: Mod assist General bed mobility comments: assist for trunk elevation when transitioning to sitting; assist for LEs onto EOB when returning to sidelying. use of rail and increased time/effort. transferring to the left  Transfers Overall transfer level: Needs assistance Equipment used: 1 person hand held assist Transfers: Sit to/from Stand Sit to Stand: Min assist          General transfer comment: boosting and steadying assist to rise from EOB. pt required x2 attempts to stand as she initiates return to sitting after initial stand, states due to feeling like she's falling backwards    Balance Overall balance assessment: History of Falls;Needs assistance Sitting-balance support: Feet supported Sitting balance-Leahy Scale: Fair     Standing balance support: Bilateral upper extremity supported Standing balance-Leahy Scale: Poor Standing balance comment: posterior lean in standing                           ADL either performed or assessed with clinical judgement   ADL Overall ADL's : Needs assistance/impaired Eating/Feeding: Set up;Sitting   Grooming: Set up;Min guard;Sitting   Upper Body Bathing: Minimal assistance;Sitting   Lower Body Bathing: Maximal assistance;Sit to/from stand   Upper Body Dressing : Minimal assistance;Sitting   Lower Body Dressing: Maximal assistance;Sit to/from stand Lower Body Dressing Details (indicate cue type and reason): minA standing balance; pt unable to reach towards her feet this session     Toileting- Clothing Manipulation and Hygiene: Moderate assistance;Maximal assistance;Sit to/from stand       Functional mobility during ADLs: Minimal assistance(HHA) General ADL Comments: pt limited due to pain/weakness                  Pertinent Vitals/Pain Pain Assessment: Faces Pain Score: 7  Faces Pain Scale: Hurts little more Pain Location: R flank Pain Descriptors / Indicators: Grimacing;Aching;Sore Pain Intervention(s): Monitored during session;Repositioned;Limited activity within patient's tolerance     Hand Dominance     Extremity/Trunk Assessment Upper Extremity Assessment Upper  Extremity Assessment: Generalized weakness   Lower Extremity Assessment Lower Extremity Assessment: Defer to PT evaluation   Cervical / Trunk Assessment Cervical / Trunk Assessment: Kyphotic    Communication Communication Communication: HOH   Cognition Arousal/Alertness: Awake/alert Behavior During Therapy: Flat affect Overall Cognitive Status: Impaired/Different from baseline Area of Impairment: Memory;Attention;Problem solving                   Current Attention Level: Sustained Memory: Decreased short-term memory         General Comments: pt with difficulty problem solving current deficits and function related to baseline; pt distractible and requires redirection to task at hand   General Comments  VSS on 1L     Exercises     Shoulder Instructions      Home Living Family/patient expects to be discharged to:: Private residence Living Arrangements: Alone Available Help at Discharge: Family;Available PRN/intermittently Type of Home: House Home Access: Stairs to enter [employer] of Steps: 3 Entrance Stairs-Rails: None Home Layout: One level     Bathroom Shower/Tub: Tub/shower unit;Walk-in shower   Bathroom Toilet: Handicapped height     Home Equipment: Environmental consultant - 4 wheels;Shower seat;Bedside commode          Prior Functioning/Environment Level of Independence: Needs assistance  Gait / Transfers Assistance Needed: walks with walker ADL's / Homemaking Assistance Needed: daughter helps with groceries and bathing, dressing            OT Problem List: Decreased strength;Decreased range of motion;Decreased activity tolerance;Impaired balance (sitting and/or standing);Decreased safety awareness;Pain;Cardiopulmonary status limiting activity;Decreased knowledge of precautions;Decreased knowledge of use of DME or AE      OT Treatment/Interventions: Self-care/ADL training;Neuromuscular education;Energy conservation;DME and/or AE instruction;Therapeutic activities;Patient/family education;Balance training    OT Goals(Current goals can be found in the care plan section) Acute Rehab OT Goals Patient Stated Goal: return home with help of  family OT Goal Formulation: With patient Time For Goal Achievement: 05/10/19 Potential to Achieve Goals: Good  OT Frequency: Min 2X/week   Barriers to D/C:            Co-evaluation              AM-PAC OT "6 Clicks" Daily Activity     Outcome Measure Help from another person eating meals?: None Help from another person taking care of personal grooming?: A Little Help from another person toileting, which includes using toliet, bedpan, or urinal?: A Lot Help from another person bathing (including washing, rinsing, drying)?: A Lot Help from another person to put on and taking off regular upper body clothing?: A Little Help from another person to put on and taking off regular lower body clothing?: A Lot 6 Click Score: 16   End of Session Equipment Utilized During Treatment: Oxygen Nurse Communication: Mobility status  Activity Tolerance: Patient tolerated treatment well Patient left: in bed;with bed alarm set;with call bell/phone within reach  OT Visit Diagnosis: Muscle weakness (generalized) (M62.81);History of falling (Z91.81);Unsteadiness on feet (R26.81)                Time: 2409-7353 OT Time Calculation (min): 23 min Charges:  OT General Charges $OT Visit: 1 Visit OT Evaluation $OT Eval Moderate Complexity: 1 Mod OT Treatments $Self Care/Home Management : 8-22 mins  Lou Cal, OT [facility] Pager [phone] Office [phone]   Raymondo Band 04/26/2019, 2:51 PM

## 2019-04-26 NOTE — [place] FL2 (Signed)
[place] MEDICAID FL2 LEVEL OF CARE SCREENING TOOL     IDENTIFICATION  Patient Name: [name] Birthdate: 07-Oct-1935 Sex: female Admission Date (Current Location): 04/24/2019  [facility] [employer] and [place] Number:  Producer, television/film/video and Address:  [facility], [address], [place], [place] [address]      Provider Number: [id number]  Attending Physician Name and Address:  Esperanza Sheets, MD  Relative Name and Phone Number:   Sharima Matt, daughter, [phone]    Current Level of Care: Hospital Recommended Level of Care: Skilled Nursing Facility Prior Approval Number:    Date Approved/Denied:   PASRR Number: [id number]  Discharge Plan: SNF    Current Diagnoses: Patient Active Problem List   Diagnosis Date Noted  . Pneumothorax 04/25/2019  . Multiple fractures of ribs, right side, initial encounter for closed fracture 04/25/2019  . Fall at home, initial encounter 04/25/2019  . SIRS (systemic inflammatory response syndrome) (HCC) 11/20/2017  . HCAP (healthcare-associated pneumonia) 10/30/2017  . Fracture of three ribs, left, closed, initial encounter 10/30/2017  . Sepsis associated hypotension (HCC) 10/19/2017  . Elevated troponin 10/19/2017  . Dizziness 07/24/2016  . Dyspnea 12/06/2015  . Hypoxia 12/06/2015  . Hypokalemia 10/21/2015  . Hypophosphatemia 10/21/2015  . Hypomagnesemia 10/21/2015  . CAP (community acquired pneumonia) 10/20/2015  . B12 deficiency   . Malnutrition of moderate degree (HCC) 03/18/2015  . Altered mental state 03/17/2015  . Metabolic encephalopathy 09/12/2014    Class: Acute  . Sepsis secondary to UTI (HCC) 09/09/2014  . S/P TAVR (transcatheter aortic valve replacement) 03/16/2014  . Left rib fracture 11/17/2013  . Closed pelvic fracture (HCC) 11/17/2013  . Sepsis (HCC) 11/10/2013  . UTI (lower urinary tract infection) 11/09/2013  . Sepsis syndrome 09/17/2013  . COPD (chronic obstructive pulmonary disease)  (HCC) 07/20/2013  . Chest pain 06/29/2013  . Hearing loss 03/25/2013  . Memory deficit 03/25/2013  . Depression 03/25/2013  . Syncope 03/25/2013  . Pulmonary fibrosis (HCC)   . Aortic stenosis, severe 02/26/2013  . Pancreatic insufficiency (HCC) 11/09/2012    Class: Chronic  . Low back pain 01/16/2012    Class: Acute  . Gait instability 01/16/2012    Class: Acute  . Sinusitis, acute, sphenoidal 11/17/2011    Class: Acute  . Pneumonia 11/12/2011    Class: Acute  . Chronic adrenal insufficiency (HCC) 11/12/2011    Class: Chronic  . E. coli UTI (urinary tract infection) 10/01/2011  . Fever 10/01/2011  . Cough 10/01/2011  . Failure to thrive in adult 10/01/2011  . Weakness generalized 10/01/2011  . Rheumatoid arthritis involving multiple joints (HCC) 10/01/2011  . DM2 (diabetes mellitus, type 2) (HCC) 10/01/2011    Orientation RESPIRATION BLADDER Height & Weight     Self, Time, Situation, Place  O2(2L nasal canula) Continent, External catheter Weight: 122 lb 12.7 oz (55.7 kg) Height:  5\' 3"  (160 cm)  BEHAVIORAL SYMPTOMS/MOOD NEUROLOGICAL BOWEL NUTRITION STATUS      Continent Diet(see discharge summary)  AMBULATORY STATUS COMMUNICATION OF NEEDS Skin   Extensive Assist Verbally Other (Comment)(ecchymosis on right chest)                       Personal Care Assistance Level of Assistance  Bathing, Dressing, Feeding Bathing Assistance: Maximum assistance Feeding assistance: Independent Dressing Assistance: Maximum assistance     Functional Limitations Info  Speech, Hearing, Sight Sight Info: Adequate Hearing Info: Adequate Speech Info: Adequate    SPECIAL CARE FACTORS  FREQUENCY  PT (By licensed PT), OT (By licensed OT)     PT Frequency: 5x week OT Frequency: 5x week            Contractures Contractures Info: Not present    Additional Factors Info  Allergies, Code Status, Psychotropic, Insulin Sliding Scale Code Status Info: Full Code Allergies Info:  Penicillins, Tape Psychotropic Info: ALPRAZolam (XANAX) tablet 0.5 mg 2x daily PO;buPROPion (WELLBUTRIN) tablet 100 mg daily PO at 10am; mirtazapine (REMERON) tablet 15 mg daily at bedtime; nortriptyline (PAMELOR) capsule 25 mg daily at bedtime PO Insulin Sliding Scale Info: insulin aspart (novoLOG) injection 0-9 Units 3x daily with meals       Current Medications (04/26/2019):  This is the current hospital active medication list Current Facility-Administered Medications  Medication Dose Route Frequency Provider Last Rate Last Dose  . acetaminophen (TYLENOL) tablet 500 mg  500 mg Oral TID Kinnie Feil, MD   500 mg at 04/26/19 1028  . acetaminophen (TYLENOL) tablet 650 mg  650 mg Oral Q6H PRN Kinnie Feil, MD      . ALPRAZolam Duanne Moron) tablet 0.5 mg  0.5 mg Oral BID Gala Romney L, MD   0.5 mg at 04/26/19 0811  . aspirin chewable tablet 81 mg  81 mg Oral Daily Kinnie Feil, MD   81 mg at 04/26/19 1029  . [START ON 04/27/2019] bisoprolol (ZEBETA) tablet 5 mg  5 mg Oral Daily Buriev, Arie Sabina, MD      . buPROPion [facility]) tablet 100 mg  100 mg Oral q morning - 10a Jonelle Sidle, Mohammad L, MD   100 mg at 04/26/19 1028  . cholecalciferol (VITAMIN D3) tablet 1,000 Units  1,000 Units Oral Daily Elwyn Reach, MD   1,000 Units at 04/26/19 [phone]  . fentaNYL (SUBLIMAZE) injection 50 mcg  50 mcg Intravenous Once Palumbo, April, MD      . heparin injection 5,000 Units  5,000 Units Subcutaneous Q8H Elwyn Reach, MD   5,000 Units at 04/26/19 1513  . insulin aspart (novoLOG) injection 0-9 Units  0-9 Units Subcutaneous TID WC Elwyn Reach, MD   2 Units at 04/25/19 1624  . ketorolac (TORADOL) 15 MG/ML injection 15 mg  15 mg Intravenous Q6H PRN Buriev, Ulugbek N, MD      . latanoprost (XALATAN) 0.005 % ophthalmic solution 1 drop  1 drop Both Eyes QHS Gala Romney L, MD   1 drop at 04/25/19 2200  . leflunomide (ARAVA) tablet 20 mg  20 mg Oral Daily Gala Romney L, MD   20 mg at  04/26/19 1328  . lidocaine (LIDODERM) 5 % 2 patch  2 patch Transdermal Q24H Palumbo, April, MD   2 patch at 04/26/19 1516  . mirtazapine (REMERON) tablet 15 mg  15 mg Oral QHS Wendee Beavers T, MD   15 mg at 04/25/19 2159  . nortriptyline (PAMELOR) capsule 25 mg  25 mg Oral QHS Gala Romney L, MD   25 mg at 04/25/19 2200  . ondansetron (ZOFRAN) tablet 4 mg  4 mg Oral Q6H PRN Elwyn Reach, MD   4 mg at 04/26/19 1327   Or  . ondansetron (ZOFRAN) injection 4 mg  4 mg Intravenous Q6H PRN Gala Romney L, MD      . oxyCODONE (Oxy IR/ROXICODONE) immediate release tablet 2.5-5 mg  2.5-5 mg Oral BID Kinnie Feil, MD   5 mg at 04/26/19 1027  . oxyCODONE (Oxy IR/ROXICODONE) immediate release tablet 5 mg  5  mg Oral Q6H PRN Esperanza Sheets, MD      . predniSONE (DELTASONE) tablet 5 mg  5 mg Oral BID WC Rometta Emery, MD   5 mg at 04/26/19 0811  . vitamin B-12 (CYANOCOBALAMIN) tablet 1,000 mcg  1,000 mcg Oral Daily Rometta Emery, MD   1,000 mcg at 04/26/19 [id number]     Discharge Medications: Please see discharge summary for a list of discharge medications.  Relevant Imaging Results:  Relevant Lab Results:   Additional Information SS#[id number] [place], [place]

## 2019-04-26 NOTE — Progress Notes (Signed)
CSW received a phone call back from the patient's daughter. She wanted time to think about it. CSW sent her the CMS SNF List. She will follow up on Tuesday.   CSW will continue to follow.   Domenic Schwab, MSW, [place]

## 2019-04-27 ENCOUNTER — Encounter (HOSPITAL_COMMUNITY): Payer: Self-pay | Admitting: Cardiology

## 2019-04-27 DIAGNOSIS — I429 Cardiomyopathy, unspecified: Secondary | ICD-10-CM

## 2019-04-27 LAB — GLUCOSE, CAPILLARY
Glucose-Capillary: 67 mg/dL — ABNORMAL LOW (ref 70–99)
Glucose-Capillary: 80 mg/dL (ref 70–99)
Glucose-Capillary: 109 mg/dL — ABNORMAL HIGH (ref 70–99)
Glucose-Capillary: 109 mg/dL — ABNORMAL HIGH (ref 70–99)
Glucose-Capillary: 173 mg/dL — ABNORMAL HIGH (ref 70–99)

## 2019-04-27 MED ORDER — FUROSEMIDE 20 MG PO TABS
20.0000 mg | ORAL_TABLET | Freq: Every day | ORAL | Status: DC
  Administered 2019-04-27 – 2019-05-01 (×5): 20 mg via ORAL
  Filled 2019-04-27 (×5): qty 1

## 2019-04-27 MED ORDER — ATORVASTATIN CALCIUM 10 MG PO TABS
20.0000 mg | ORAL_TABLET | Freq: Every day | ORAL | Status: DC
  Administered 2019-04-27 – 2019-04-30 (×4): 20 mg via ORAL
  Filled 2019-04-27 (×4): qty 2

## 2019-04-27 MED ORDER — POLYETHYLENE GLYCOL 3350 17 G PO PACK
17.0000 g | PACK | Freq: Every day | ORAL | Status: DC
  Administered 2019-04-27 – 2019-05-01 (×3): 17 g via ORAL
  Filled 2019-04-27 (×4): qty 1

## 2019-04-27 MED ORDER — BISACODYL 5 MG PO TBEC
10.0000 mg | DELAYED_RELEASE_TABLET | Freq: Every day | ORAL | Status: DC
  Administered 2019-04-27 – 2019-05-01 (×5): 10 mg via ORAL
  Filled 2019-04-27 (×5): qty 2

## 2019-04-27 NOTE — Consult Note (Addendum)
Cardiology Consultation:   Patient ID: Jetta LoutWillow H Piggott MRN: [id number]; DOB: [birth year]  Admit date: 04/24/2019 Date of Consult: 04/27/2019  Primary Care Provider: Jarome MatinPaterson, Daniel, MD Primary Cardiologist: Chrystie NoseKenneth C Hilty, MD  Primary Electrophysiologist:  None    Patient Profile:   Kasi Tammy SoursH Dehaan is a 84 y.o. female with a hx of aortic stenosis s/p TAVR 2014, COPD on chronic oxygen, rheumatoid arthritis, pulmonary fibrosis, adrenal insufficiency, anxiety disorder and gait instability with previous falls who is being seen today for the evaluation of congestive heart failure at the request of Dr. York SpanielBuriev.  History of Present Illness:   Ms. Azucena CecilBurton has past medical history as above.  In the past she has been known to have normal-low normal LV systolic function with concentric LVH.  In 2014 she was noted to have presyncopal episodes, palpitations and tachycardia.  On echocardiogram she was found to have severe aortic stenosis and was referred for aortic valve replacement.  Due to her high risk she [facility] 29 mm CoreValve placement.   Ms. Azucena CecilBurton was brought to the hospital by EMS on 04/24/2019 after a fall and hitting her right chest on the counter.  This was described as a mechanical fall and she did not hit her head. Initial chest x-ray showed small right apical pneumothorax, estimated at 10% and acute displaced right seventh rib fracture and possible nondisplaced right ninth rib fracture.  Follow-up chest x-rays have demonstrated stable pneumothorax.  Trauma surgeon was consulted and felt no intervention was indicated.  Echocardiogram was done showing new LV systolic dysfunction with EF 35-40%, moderate hypokinesis in multiple aspects of the left ventricle.  She was started on daily aspirin and continued on beta-blocker and statin.  Her home Lasix was resumed.  Notes indicate no signs and symptoms of acute heart failure.  Cardiology was consulted for further evaluation of newly  decreased LV systolic function.  Initially her O2 sat was low and she required 100% nonrebreather mask.  COVID-19 testing was negative.  High-sensitivity troponins: 50, 134, 131  Ms. Azucena CecilBurton lives alone. She appears frail but she says that she is able to prepare her meals and do light housework. Her daughter helps her and dose the driving. She says that she has had multiple falls over the last several years. She denies that she has dizziness causing her falls. On July 4th she had made her bed and went over to close her drapes. She she turned her head back to the right to turn around she felt funny, although she says not dizzy. She then went down. She hit her chest on the dresser but did not hit her head or lose consciousness.   She currently has chest wall pain at site of injury but denies substernal chest pain. She says that she does not get chest pain or shortness of breath with her household chores. She used oxygen several years ago but no longer. She has lower extremity edema, L>R,  for which her legs are wrapped, for several months. Her PCP took her off lasix. She says that she was treated for blood clot a few years ago at [facility] with Eliquis but [facility] was told that she never had a blood clot. She denies orthopnea or PND, sleeps on a flat bed with 1 pillow.   Heart Pathway Score:     Past Medical History:  Diagnosis Date   Adrenal insufficiency, primary, iatrogenic    Anxiety    Aortic stenosis,  severe 02/26/2013   Chest pain    myoview 04/13/04-inferoapical thinning with a suggestion of mild apical ischemia, EF 66%   Depression 03/25/2013   Gait instability    Hearing loss 03/25/2013   Memory deficit 03/25/2013   Murmur, cardiac    Osteoporosis    Palpitations    Pancreatic insufficiency 11/09/2012   Physical deconditioning    Pneumonia    Pulmonary fibrosis (HCC) 03/25/2013   Rheumatoid arthritis(714.0)    SOBOE (shortness of breath on exertion)    echo  02/23/13-EF 55-60%, possible bicuspid aortic valve with mod to severed stenosis    Syncope    UTI (lower urinary tract infection) 10/01/2011    Past Surgical History:  Procedure Laterality Date   ABDOMINAL HYSTERECTOMY     CARDIAC CATHETERIZATION  11/08/96   no evidence of CAD, nl LV function   CHOLECYSTECTOMY     CORONARY ANGIOGRAM  03/22/2013   Procedure: CORONARY ANGIOGRAM;  Surgeon: [name], MD;  Location: [facility] C[facility] LAB;  Service: Cardiovascular;;   KNEE ARTHROSCOPY     2 on left   RIGHT HEART CATHETERIZATION  03/22/2013   Procedure: RIGHT HEART CATH;  Surgeon: [name], MD;  Location: [facility] CATH LAB;  Service: Cardiovascular;;   SHOULDER SURGERY Left    THYROIDECTOMY       Home Medications:  Prior to Admission medications   Medication Sig Start Date End Date Taking? Authorizing Provider  acetaminophen (TYLENOL) 500 MG tablet Take 500 mg by mouth every 6 (six) hours as needed for moderate pain.   Yes [provider]  ALPRAZolam Prudy Feeler) 1 MG tablet Take 0.5 tablets (0.5 mg total) by mouth at bedtime as needed for anxiety. Anxiety Patient taking differently: Take 0.5 mg by mouth 2 (two) times daily. Anxiety  11/24/17  Yes Emokpae, Courage, MD  bisoprolol (ZEBETA) 5 MG tablet Take 0.5 tablets (2.5 mg total) by mouth daily. 07/24/16  Yes Hilty, Lisette Abu, MD  buPROPion (WELLBUTRIN) 100 MG tablet Take 100 mg by mouth every morning. 10/06/15  Yes [provider]  cholecalciferol (VITAMIN D3) 25 MCG (1000 UT) tablet Take 1,000 Units by mouth daily.   Yes [provider]  leflunomide (ARAVA) 20 MG tablet Take 1 tablet (20 mg total) by mouth daily. 11/10/17  Yes Tyrone Nine, MD  mirtazapine (REMERON) 30 MG tablet Take 30 mg by mouth at bedtime.    Yes [provider]  nortriptyline (PAMELOR) 25 MG capsule Take 1 capsule (25 mg total) by mouth at bedtime. 11/24/17  Yes Shon Hale, MD  predniSONE (DELTASONE) 5 MG tablet Take 5 mg by  mouth 2 (two) times daily with a meal.  11/24/18  Yes [provider]  TRAVATAN Z 0.004 % SOLN ophthalmic solution Place 1 drop into both eyes at bedtime. 09/16/15  Yes [provider]  vitamin B-12 (CYANOCOBALAMIN) 1000 MCG tablet Take 1 tablet (1,000 mcg total) by mouth daily. 03/22/15  Yes Osvaldo Shipper, MD  furosemide (LASIX) 20 MG tablet Take 1 tablet (20 mg total) by mouth daily as needed. Patient not taking: Reported on 07/24/2018 11/24/17 11/24/18  Shon Hale, MD  HYDROcodone-acetaminophen (NORCO/VICODIN) 5-325 MG tablet Take 1 tablet by mouth every 6 (six) hours as needed for moderate pain. Patient not taking: Reported on 07/24/2018 11/24/17   Shon Hale, MD  ondansetron (ZOFRAN) 4 MG tablet Take 1 tablet (4 mg total) by mouth every 6 (six) hours as needed for nausea. Patient not taking: Reported on 07/24/2018 11/24/17  Shon HaleEmokpae, Courage, MD  predniSONE (DELTASONE) 10 MG tablet Take 40 mg (4 tablets) daily for 3 days, then 3 tablets daily for 3 days, then 2 tablets daily for 3 days, then 1 tablet daily for 4 days and stop Patient not taking: Reported on 04/25/2019 11/24/17   Shon HaleEmokpae, Courage, MD  QUEtiapine (SEROQUEL) 25 MG tablet Take 0.5 tablets (12.5 mg total) by mouth every evening. Patient not taking: Reported on 07/24/2018 11/24/17   Shon HaleEmokpae, Courage, MD    Inpatient Medications: Scheduled Meds:  acetaminophen  500 mg Oral TID   ALPRAZolam  0.5 mg Oral BID   aspirin  81 mg Oral Daily   atorvastatin  20 mg Oral q1800   bisacodyl  10 mg Oral Daily   bisoprolol  5 mg Oral Daily   buPROPion  100 mg Oral q morning - 10a   cholecalciferol  1,000 Units Oral Daily   fentaNYL (SUBLIMAZE) injection  50 mcg Intravenous Once   furosemide  20 mg Oral Daily   heparin  5,000 Units Subcutaneous Q8H   latanoprost  1 drop Both Eyes QHS   leflunomide  20 mg Oral Daily   lidocaine  2 patch Transdermal Q24H   mirtazapine  15 mg Oral QHS   nortriptyline  25 mg Oral  QHS   oxyCODONE  2.5-5 mg Oral BID   polyethylene glycol  17 g Oral Daily   predniSONE  5 mg Oral BID WC   vitamin B-12  1,000 mcg Oral Daily   Continuous Infusions:  PRN Meds: acetaminophen, ketorolac, ondansetron **OR** ondansetron (ZOFRAN) IV, oxyCODONE  Allergies:    Allergies  Allergen Reactions   Penicillins Hives    Has patient had a PCN reaction causing immediate rash, facial/tongue/throat swelling, SOB or lightheadedness with hypotension: No Has patient had a PCN reaction causing severe rash involving mucus membranes or skin necrosis: No Has patient had a PCN reaction that required hospitalization No Has patient had a PCN reaction occurring within the last 10 years: No If all of the above answers are "NO", then may proceed with Cephalosporin use.    Tape Other (See Comments)    Skin peels up.  Skin peels up.     Social History:   Social History   Socioeconomic History   Marital status: Widowed    Spouse name: Not on file   Number of children: 3   Years of education: Not on file   Highest education level: Not on file  Occupational History   Occupation: retired    Comment: [employer]   Financial resource strain: Not on file   Food insecurity    Worry: Not on file    Inability: Not on Occupational hygienistfile   Transportation needs    Medical: Not on file    Non-medical: Not on file  Tobacco Use   Smoking status: Never Smoker   Smokeless tobacco: Never Used  Substance and Sexual Activity   Alcohol use: No   Drug use: No   Sexual activity: Never  Lifestyle   Physical activity    Days per week: Not on file    Minutes per session: Not on file   Stress: Not on file  Relationships   Social connections    Talks on phone: Not on file    Gets together: Not on file    Attends religious service: Not on file    Active member of club or organization: Not on file    Attends meetings of  clubs or organizations: Not on file    Relationship  status: Not on file   Intimate partner violence    Fear of current or ex partner: Not on file    Emotionally abused: Not on file    Physically abused: Not on file    Forced sexual activity: Not on file  Other Topics Concern   Not on file  Social History Narrative   Not on file    Family History:    Family History  Problem Relation Age of Onset   Cancer - Colon Father    Stroke Brother    Cancer Sister        spine?     ROS:  Please see the history of present illness.   All other ROS reviewed and negative.     Physical Exam/Data:   Vitals:   04/26/19 2314 04/27/19 0332 04/27/19 0709 04/27/19 0712  BP: (!) 151/65 (!) 143/59    Pulse: 86 79 94 93  Resp: 18 19 18  (!) 21  Temp: 97.7 F (36.5 C) 97.9 F (36.6 C)    TempSrc: Oral Oral    SpO2: 94% 98% (!) 84% 100%  Weight:      Height:        Intake/Output Summary (Last 24 hours) at 04/27/2019 1401 Last data filed at 04/27/2019 0930 Gross per 24 hour  Intake 830 ml  Output 200 ml  Net 630 ml   Last 3 Weights 04/25/2019 07/24/2018 11/24/2017  Weight (lbs) 122 lb 12.7 oz 119 lb 108 lb 0.4 oz  Weight (kg) 55.7 kg 53.978 kg 49 kg     Body mass index is 21.75 kg/m.  General:  Frail, elderly female, in no acute distress HEENT: normal Lymph: no adenopathy Neck: no JVD Endocrine:  No thryomegaly Vascular: No carotid bruits; FA pulses 2+ bilaterally without bruits  Cardiac:  normal S1, S2; RRR; 2/6 soft murmur at RUSB, referred to carotid Lungs:  clear to auscultation bilaterally, no wheezing, rhonchi or rales  Abd: soft, nontender, no hepatomegaly  Ext: trace lower leg edema with wraps on bilat Musculoskeletal:  No deformities, BUE and BLE strength normal and equal Skin: warm and dry  Neuro:  CNs 2-12 intact, no focal abnormalities noted Psych:  Normal affect   EKG:  The EKG from 04/24/19 was personally reviewed and demonstrates:  Sinus tachycardia, 101 bpm, LVH, lateral wave ST/T abnormality likely related to LVH,  similar to old EKG in 2019 Telemetry:  Telemetry was personally reviewed and demonstrates:  SR 70's-80's  Relevant CV Studies:  Echocardiogram 04/26/2019 IMPRESSIONS   1. Moderate hypokinesis of the left ventricular, basal-mid inferoseptal wall and inferior wall.  2. Moderate hypokinesis of the left ventricular, entire anteroseptal wall.  3. The left ventricle has moderately reduced systolic function, with an ejection fraction of 35-40%. The cavity size was normal. There is moderate concentric left ventricular hypertrophy. Left ventricular diastolic Doppler parameters are consistent with  impaired relaxation. Elevated left atrial and left ventricular end-diastolic pressures The E/e' is 41. Left ventricular diffuse hypokinesis.  4. The right ventricle has normal systolic function. The cavity was normal. There is no increase in right ventricular wall thickness. Right ventricular systolic pressure is normal.  5. Left atrial size was moderately dilated.  6. Small pericardial effusion.  7. Mild thickening of the mitral valve leaflet. There is mild to moderate mitral annular calcification present. No evidence of mitral valve stenosis.  8. Tricuspid valve regurgitation is moderate.  9. A 38mm Medtronic-Hall  CoreValve valve is present in the aortic position. Echo findings are consistent with perivalvular leak of the aortic prosthesis. 10. The interatrial septum was not well visualized.   Right heart cath 03/22/2013 Impression: 1.  no significant obstructive coronary disease. Left dominant circulation. 2.  severe aortic stenosis with a heavily calcified aortic valve 3.  mildly elevated right heart pressures, low PaO2, no significant congestive left heart failure 4.  mildly reduced cardiac output and index by Fick and thermodilution  Plan: 1.  further workup of severe aortic stenosis by CT surgery. 2.  Will likely need pulmonary consultation given moderate to severe obstruction and severe  diffusion abnormalities on recent prominent function testing 3.  continue current medical regimen  Laboratory Data:  High Sensitivity Troponin:   Recent Labs  Lab 04/24/19 2309 04/25/19 1833 04/25/19 1917  TROPONINIHS 50* 134* 131*     Cardiac EnzymesNo results for input(s): TROPONINI in the last 168 hours. No results for input(s): TROPIPOC in the last 168 hours.  Chemistry Recent Labs  Lab 04/24/19 2309 04/25/19 0826 04/26/19 0413  NA 143 142 141  K 4.1 3.6 4.0  CL 107 104 103  CO2 27 29 27   GLUCOSE 108* 112* 93  BUN 21 13 8   CREATININE 0.73 0.70 0.52  CALCIUM 9.0 9.2 8.6*  GFRNONAA >60 >60 >60  GFRAA >60 >60 >60  ANIONGAP 9 9 11     Recent Labs  Lab 04/25/19 0826  PROT 5.7*  ALBUMIN 3.2*  AST 16  ALT 13  ALKPHOS 61  BILITOT 0.8   Hematology Recent Labs  Lab 04/24/19 2309 04/25/19 0826 04/26/19 0413  WBC 10.3 9.8 11.7*  RBC 3.85* 4.32 4.09  HGB 11.8* 13.3 12.7  HCT 38.1 42.2 39.2  MCV 99.0 97.7 95.8  MCH 30.6 30.8 31.1  MCHC 31.0 31.5 32.4  RDW 13.9 13.7 13.7  PLT 153 148* 142*   BNPNo results for input(s): BNP, PROBNP in the last 168 hours.  DDimer No results for input(s): DDIMER in the last 168 hours.   Radiology/Studies:  Ct Head Wo Contrast  Result Date: 04/25/2019 CLINICAL DATA:  Fall landing on right side. EXAM: CT HEAD WITHOUT CONTRAST CT CERVICAL SPINE WITHOUT CONTRAST TECHNIQUE: Multidetector CT imaging of the head and cervical spine was performed following the standard protocol without intravenous contrast. Multiplanar CT image reconstructions of the cervical spine were also generated. COMPARISON:  Head CT 10/30/2017, head and cervical spine CT 10/14/2015 FINDINGS: CT HEAD FINDINGS Brain: No intracranial hemorrhage, mass effect, or midline shift. Stable degree of atrophy and chronic small vessel ischemia from prior exam. Small remote occipital infarct. No hydrocephalus. The basilar cisterns are patent. No evidence of territorial infarct or  acute ischemia. No extra-axial or intracranial fluid collection. Vascular: Atherosclerosis of skullbase vasculature without hyperdense vessel or abnormal calcification. Skull: No fracture or focal lesion. Sinuses/Orbits: Paranasal sinuses and mastoid air cells are clear. The visualized orbits are unremarkable. Bilateral cataract resection. Other: None. CT CERVICAL SPINE FINDINGS Mild motion artifact. Alignment: Exaggerated cervical lordosis. No traumatic subluxation. Unchanged mild retrolisthesis of C3 on C4, degenerative. Skull base and vertebrae: No acute fracture. Dens and skull base are intact. None fusion posterior elements of C2, congenital. Slight loss of height of T1 vertebral body is chronic and unchanged. Soft tissues and spinal canal: No prevertebral fluid or swelling. No visible canal hematoma. Disc levels: Multilevel degenerative disc disease most prominent at C4-C5. Multilevel facet hypertrophy. Upper chest: Small right apically pneumothorax as seen on chest radiograph earlier  this day. Other: Carotid calcifications. IMPRESSION: 1. No acute intracranial abnormality. No skull fracture. Stable atrophy and chronic small vessel ischemia. 2. Multilevel degenerative change in the cervical spine without acute fracture or subluxation. 3. Small right apical pneumothorax as seen on chest radiograph earlier this day. Electronically Signed   By: Narda Rutherford M.D.   On: 04/25/2019 01:42   Ct Cervical Spine Wo Contrast  Result Date: 04/25/2019 CLINICAL DATA:  Fall landing on right side. EXAM: CT HEAD WITHOUT CONTRAST CT CERVICAL SPINE WITHOUT CONTRAST TECHNIQUE: Multidetector CT imaging of the head and cervical spine was performed following the standard protocol without intravenous contrast. Multiplanar CT image reconstructions of the cervical spine were also generated. COMPARISON:  Head CT 10/30/2017, head and cervical spine CT 10/14/2015 FINDINGS: CT HEAD FINDINGS Brain: No intracranial hemorrhage, mass  effect, or midline shift. Stable degree of atrophy and chronic small vessel ischemia from prior exam. Small remote occipital infarct. No hydrocephalus. The basilar cisterns are patent. No evidence of territorial infarct or acute ischemia. No extra-axial or intracranial fluid collection. Vascular: Atherosclerosis of skullbase vasculature without hyperdense vessel or abnormal calcification. Skull: No fracture or focal lesion. Sinuses/Orbits: Paranasal sinuses and mastoid air cells are clear. The visualized orbits are unremarkable. Bilateral cataract resection. Other: None. CT CERVICAL SPINE FINDINGS Mild motion artifact. Alignment: Exaggerated cervical lordosis. No traumatic subluxation. Unchanged mild retrolisthesis of C3 on C4, degenerative. Skull base and vertebrae: No acute fracture. Dens and skull base are intact. None fusion posterior elements of C2, congenital. Slight loss of height of T1 vertebral body is chronic and unchanged. Soft tissues and spinal canal: No prevertebral fluid or swelling. No visible canal hematoma. Disc levels: Multilevel degenerative disc disease most prominent at C4-C5. Multilevel facet hypertrophy. Upper chest: Small right apically pneumothorax as seen on chest radiograph earlier this day. Other: Carotid calcifications. IMPRESSION: 1. No acute intracranial abnormality. No skull fracture. Stable atrophy and chronic small vessel ischemia. 2. Multilevel degenerative change in the cervical spine without acute fracture or subluxation. 3. Small right apical pneumothorax as seen on chest radiograph earlier this day. Electronically Signed   By: Narda Rutherford M.D.   On: 04/25/2019 01:42   Dg Chest Port 1 View  Result Date: 04/26/2019 CLINICAL DATA:  Pneumothorax. EXAM: PORTABLE CHEST 1 VIEW COMPARISON:  04/25/2019. FINDINGS: Mediastinum hilar structures normal. Heart size stable. Aortic stent graft in stable position. Left base atelectasis and tiny bilateral pleural effusions unchanged.  Small right apical pneumothorax unchanged. Right posterior rib fracture unchanged. IMPRESSION: 1. Small right apical pneumothorax unchanged. Unchanged right posterior rib fracture. 2. Left base atelectasis and tiny bilateral pleural effusions unchanged. Electronically Signed   By: Maisie Fus  Register   On: 04/26/2019 06:16   Dg Chest Port 1 View  Result Date: 04/25/2019 CLINICAL DATA:  Larey Seat. Right-sided chest pain. Follow-up pneumothorax. EXAM: PORTABLE CHEST 1 VIEW COMPARISON:  04/24/2019 FINDINGS: The heart is mildly enlarged but stable. Stable tortuosity and ectasia and calcification of the thoracic aorta. Stable TAVR. There is a stable small right apical pneumothorax estimated at 5-10%. Stable eventration of left hemidiaphragm with overlying scarring changes and mild pleural thickening. Stable right posterior rib fractures. IMPRESSION: 1. Stable 5-10% right apical pneumothorax.  No progressive findings. 2. Stable left basilar density, combination of eventration left hemidiaphragm, overlying pulmonary scarring changes and mild left pleural thickening. Electronically Signed   By: Rudie Meyer M.D.   On: 04/25/2019 13:01   Dg Chest Portable 1 View  Result Date: 04/25/2019 CLINICAL DATA:  Fall  diminished lung sounds on the right side EXAM: PORTABLE CHEST 1 VIEW COMPARISON:  11/20/2017, 10/30/2017 FINDINGS: Hazy opacity at the right lung base. Possible focal airspace disease or tiny effusion at the left base. Borderline cardiomegaly. Aortic graft similar in position. Aortic atherosclerosis. Small right apical pneumothorax, demonstrating 12 mm pleural-parenchymal separation at the apex, estimated at 10%. Acute right seventh displaced rib fracture. Possible nondisplaced right ninth rib fracture. Negative for midline shift. IMPRESSION: 1. Small right apical pneumothorax, estimated at 10% 2. Acute displaced right seventh rib fracture and possible nondisplaced right ninth rib fracture Critical Value/emergent results  were called by telephone at the time of interpretation on 04/25/2019 at 12:38 am to Dr. Cy Blamer , who verbally acknowledged these results. Electronically Signed   By: Jasmine Pang M.D.   On: 04/25/2019 00:38   Vas US Carotid  Result Date: 04/25/2019 Carotid Arterial Duplex Study Indications:       Falls. Other Factors:     Aoritic stenosis. COPD on Home O2. Comparison Study:  No prior study on file for comparison. Performing Technologist: Sherren Kerns RVS  Examination Guidelines: A complete evaluation includes B-mode imaging, spectral Doppler, color Doppler, and power Doppler as needed of all accessible portions of each vessel. Bilateral testing is considered an integral part of a complete examination. Limited examinations for reoccurring indications may be performed as noted.  Right Carotid Findings: +----------+--------+--------+--------+------------+------------------+             PSV cm/s EDV cm/s Stenosis Describe     Comments            +----------+--------+--------+--------+------------+------------------+  CCA Prox   82       15                             intimal thickening  +----------+--------+--------+--------+------------+------------------+  CCA Distal 71       16                             intimal thickening  +----------+--------+--------+--------+------------+------------------+  ICA Prox   58       22                heterogenous                     +----------+--------+--------+--------+------------+------------------+  ICA Distal 68       21                                                 +----------+--------+--------+--------+------------+------------------+  ECA        65       4                                                  +----------+--------+--------+--------+------------+------------------+ +----------+--------+-------+--------+-------------------+             PSV cm/s EDV cms Describe Arm Pressure (mmHG)  +----------+--------+-------+--------+-------------------+   Subclavian 44                                             +----------+--------+-------+--------+-------------------+ +---------+--------+--+--------+--+  Vertebral PSV cm/s 48 EDV cm/s 14  +---------+--------+--+--------+--+  Left Carotid Findings: +----------+--------+--------+--------+--------+------------------+             PSV cm/s EDV cm/s Stenosis Describe Comments            +----------+--------+--------+--------+--------+------------------+  CCA Prox   63       13                         intimal thickening  +----------+--------+--------+--------+--------+------------------+  CCA Distal 59       16                         intimal thickening  +----------+--------+--------+--------+--------+------------------+  ICA Prox   45       11                calcific                     +----------+--------+--------+--------+--------+------------------+  ECA        68       2                                              +----------+--------+--------+--------+--------+------------------+ +----------+--------+--------+--------+-------------------+  Subclavian PSV cm/s EDV cm/s Describe Arm Pressure (mmHG)  +----------+--------+--------+--------+-------------------+             82                                              +----------+--------+--------+--------+-------------------+ +---------+--------+--+--------+--+  Vertebral PSV cm/s 47 EDV cm/s 13  +---------+--------+--+--------+--+  Summary: Right Carotid: The extracranial vessels were near-normal with only minimal wall                thickening or plaque. Left Carotid: The extracranial vessels were near-normal with only minimal wall               thickening or plaque. Vertebrals:  Bilateral vertebral arteries demonstrate antegrade flow. Subclavians: Normal flow hemodynamics were seen in bilateral subclavian              arteries. *See table(s) above for measurements and observations.     Preliminary     Assessment and Plan:   Acute systolic and diastolic  CHF -Patient presented after mechanical fall with rib fractures and small pneumothorax which is stable.  Reportedly has had no signs and symptoms of acute heart failure. -Echocardiogram done yesterday showed moderately reduced LV systolic function with EF 35-40% (down from 50-55% in 10/2017), moderate LV hypokinesis of basal-mid inferoseptal wall, inferior wall and entire anterior septal wall.  There is moderate concentric left ventricular hypertrophy and also LV diastolic dysfunction.  Small pericardial effusion. EF is down from 50-55% on echo in 10/2017 and LVH is worsened from mild to moderate.  -Prior hx of no CAD on cath in 2014 prior to TAVR.  -Prior home medications include bisoprolol 2.5 mg daily.  Bisoprolol has been continued at 5 mg daily, Lasix 20 mg is being given daily.  Aspirin 81 mg and atorvastatin 20 mg have been added.  -EKG on presentation, 7/4, LVH criteria noted with without much change from prior tracings. Unclear if significant acute changes.  -HS  troponins:  50, 134, 131. No anginal type chest pain or shortness of breath. No significant S/S of heart failure. Has had chronic lower leg edema, felt to be related to venous insuf by PCP. Lasix used in the past but discontinued. Now has legs wrapped, not much edema at present.    Hx of severe aortic stenosis-s/p 29 mm [facility]Valve placement at [facility] 2014 -Echo findings are consistent with mild perivalvular leak of the aortic  prosthesis. (mild regurgitation noted in 10/2017, mild perivalvular leak noted in 09/2015 with normal gradients) -I will have Dr. Antoine Poche review echo films.    For questions or updates, please contact [facility] Please consult [email or web address] for contact info under     Signed, Berton Bon, NP  04/27/2019 2:01 PM   History and all data above reviewed.  Patient examined.  I agree with the findings as above.  The patient was in her usual frail state of health until the fall reported above.  She did not have  syncope.  This was clearly a mechanical fall.  She, prior to this was doing relatively well without acute complaint.  She does not leave the house.  She does not report PND or orthopnea.  she did have some leg edema but has done OK with leg wraps.  She has had no acute SOB.  She is found now to have an EF lower than previous.  There does appear to be some regional wall motion abnormality.   The patient exam reveals COR:RRR, 2/6 apical systolic murmur, 2/6 diastolic murmur  ,  Lungs: Clear  ,  Abd: Positive bowel sounds, no rebound no guarding, Ext No edema  General:  Frail  .  All available labs, radiology testing, previous records reviewed. Agree with documented assessment and plan.   Cardiomyopathy:  Her EF does appear to be lower.  However, at this point I would not suggest cath but would continue medical management.  She appears to be euvolemic by exam.  She might tolerate low dose ACE inhibitor but I would ask there her orthostatic BP be checked prior to starting this.  AVR:  I note a diastolic murmur and perivalvular leak on echo.  However, I compared images from the previous echo and this was evident at this time as well.  No change in therapy.    Fayrene Fearing Duard Spiewak  5:27 PM  04/27/2019

## 2019-04-27 NOTE — TOC Progression Note (Signed)
Transition of Care [facility]) - Progression Note    Patient Details  Name: IVYLYNN HOPPES MRN: [id number] Date of Birth: 02-19-1935  Transition of Care [facility] CM/SW [place], [place] Phone Number: 04/27/2019, 11:00 AM  Clinical Narrative:     CSW received a phone call from the patient's daughter [name]. She stated that she had spoken with the MD and her mother is now agreeable to SNF. Her first two preferences are [place] and [place]. CSW stated that both of the facilities are usually full and encouraged the patient's daughter to find back up options. The patient's daughter said she will do more research and let the CSW know back up options. CSW will call with bed offers.   CSW will continue to follow.   Expected Discharge Plan: [place] Barriers to Discharge: Continued Medical Work up  Expected Discharge Plan and Services Expected Discharge Plan: [place] In-house Referral: Clinical Social Work   Post Acute Care Choice: [place] Living arrangements for the past 2 months: Single Family Home                                       Social Determinants of Health (SDOH) Interventions    Readmission Risk Interventions No flowsheet data found.

## 2019-04-27 NOTE — Progress Notes (Addendum)
[facility] PROGRESS NOTE  Jetta LoutWillow H Screws WUJ:[id number] DOB: Aug 22, 1935 DOA: 04/24/2019 PCP: Jarome MatinPaterson, Daniel, MD  Brief summary  Leah BroodWillow H Azucena [name] is a 84 y.o. female with medical history significant of aortic stenosis s/p tavr, COPD on chronic oxygen, rheumatoid arthritis, pulmonary fibrosis.,  Gait instability, anxiety disorder, adrenal insufficiency who lives at home and has had previous falls.  Patient was brought in by EMS today after sustaining a fall and hitting her right chest on the counter. Patient described as mechanical in nature.  She did not hit her head.  Did not have any loss of consciousness.    ED Course: Temperature is 98.6 blood pressure 168/78, pulse 101 respiratory of 26 oxygen sat 91% on nonrebreather bag.  White count 10.3 hemoglobin 11.8 and platelets 153.  Chemistry entirely within normal.  COVID-19's negative.  CT cervical spine and CT head without contrast showed no acute cranial abnormalities.  Multiple level degenerative change in the cervical spine is seen small right apical pneumothorax and a chest radiograph.  Chest x-ray showed 10% right apical pneumothorax.  Also showed acute displaced right seventh rib fracture and possible nondisplaced right ninth rib fracture.  Trauma surgery consulted.  Patient given a dose of Toradol and will be admitted to the hospital for continued care. Covid 19-negative   Assessment/Plan:  Fall. Right rib fractures. apical pneumothorax: Only 10%.  Trauma consulted. No indication for intervention.  Follow-up chest x-ray: no change. Continue monitoring. reportedly-multiple falls at home. pt/ot. Patient is agreeable for rehab/snf   Right sided new rib fractures (7th and 9th) due to fall: Patient appears to have old left-sided rib fracture also. Uncontrolled pain: D/w agreed to start low dose oxy 2.5 BID+tylenol. + PRN. Titrate pain meds as needed. Cont incentive spirometry. Add bowel regimen   Rheumatoid arthritis: Continue home  regiment of treatment.  Diabetes: last ha1c-5.0. d/c ISS due to hypoglycemia. Diet control   COPD: No evidence of exacerbation.  Continue oxygen and empiric treatment from home.  Chronic adrenal insufficiency: Continue home oral replacement.  Mild  elevated troponin. Echo: new LV dysfunction at 35-40%, Moderate hypokinesis of the left ventricular, basal-mid inferoseptal wall and inferior wall. Consulted cardiology eval for further evolution stress test vs ? need forLHC. Started daily Asprin, cont BB, statin   COPD on chronic oxygen, chronic respiratory failure, pulmonary fibrosis. No s/s of acute exacerbation. Cont home regimen  LV dysfunction (new) ef 35-40%. No s/s of acute HF. Pt with h/o AS s/p TAVR. Resume home lasix. Monitor closely   Code Status: Full Family Communication:  D/w patient, RN. Called updated her family member (indicate person spoken with, relationship, and if by phone, the number) Disposition Plan: pend clinical improvement. Needs SNF   Consultants:  Surgery   Cardiology   Procedures:  none  Antibiotics: Anti-infectives (From admission, onward)   None       (indicate start date, and stop date if known)  HPI/Subjective: Slept well. But still complains of intermittent rib pains.  No acute SOB. Echo with LV dysfunction, hypokinesia. Will ask cardiology eval   Objective: Vitals:   04/27/19 0709 04/27/19 0712  BP:    Pulse: 94 93  Resp: 18 (!) 21  Temp:    SpO2: (!) 84% 100%    Intake/Output Summary (Last 24 hours) at 04/27/2019 0841 Last data filed at 04/27/2019 0600 Gross per 24 hour  Intake 650 ml  Output 900 ml  Net -250 ml   Filed Weights   04/25/19 0653  Weight:  55.7 kg    Exam:   General:  No acute distress   Cardiovascular: s1, s2 rrr  Respiratory: diminished in R bases   Abdomen: soft, nt   Musculoskeletal: no leg edema    Data Reviewed: Basic Metabolic Panel: Recent Labs  Lab 04/24/19 2309 04/25/19 0826  04/26/19 0413  NA 143 142 141  K 4.1 3.6 4.0  CL 107 104 103  CO2 27 29 27   GLUCOSE 108* 112* 93  BUN 21 13 8   CREATININE 0.73 0.70 0.52  CALCIUM 9.0 9.2 8.6*   Liver Function Tests: Recent Labs  Lab 04/25/19 0826  AST 16  ALT 13  ALKPHOS 61  BILITOT 0.8  PROT 5.7*  ALBUMIN 3.2*   No results for input(s): LIPASE, AMYLASE in the last 168 hours. No results for input(s): AMMONIA in the last 168 hours. CBC: Recent Labs  Lab 04/24/19 2309 04/25/19 0826 04/26/19 0413  WBC 10.3 9.8 11.7*  NEUTROABS 7.3  --   --   HGB 11.8* 13.3 12.7  HCT 38.1 42.2 39.2  MCV 99.0 97.7 95.8  PLT 153 148* 142*   Cardiac Enzymes: No results for input(s): CKTOTAL, CKMB, CKMBINDEX, TROPONINI in the last 168 hours. BNP (last 3 results) No results for input(s): BNP in the last 8760 hours.  ProBNP (last 3 results) No results for input(s): PROBNP in the last 8760 hours.  CBG: Recent Labs  Lab 04/26/19 1205 04/26/19 1623 04/26/19 2055 04/27/19 0622 04/27/19 0654  GLUCAP 102* 99 141* 67* 80    Recent Results (from the past 240 hour(s))  SARS Coronavirus 2 (CEPHEID- Performed in [place] hospital lab), Hosp Order     Status: None   Collection Time: 04/25/19  1:33 AM   Specimen: Nasopharyngeal Swab  Result Value Ref Range Status   SARS Coronavirus 2 NEGATIVE NEGATIVE Final    Comment: (NOTE) If result is NEGATIVE SARS-CoV-2 target nucleic acids are NOT DETECTED. The SARS-CoV-2 RNA is generally detectable in upper and lower  respiratory specimens during the acute phase of infection. The lowest  concentration of SARS-CoV-2 viral copies this assay can detect is 250  copies / mL. A negative result does not preclude SARS-CoV-2 infection  and should not be used as the sole basis for treatment or other  patient management decisions.  A negative result may occur with  improper specimen collection / handling, submission of specimen other  than nasopharyngeal swab, presence of viral  mutation(s) within the  areas targeted by this assay, and inadequate number of viral copies  (<250 copies / mL). A negative result must be combined with clinical  observations, patient history, and epidemiological information. If result is POSITIVE SARS-CoV-2 target nucleic acids are DETECTED. The SARS-CoV-2 RNA is generally detectable in upper and lower  respiratory specimens dur ing the acute phase of infection.  Positive  results are indicative of active infection with SARS-CoV-2.  Clinical  correlation with patient history and other diagnostic information is  necessary to determine patient infection status.  Positive results do  not rule out bacterial infection or co-infection with other viruses. If result is PRESUMPTIVE POSTIVE SARS-CoV-2 nucleic acids MAY BE PRESENT.   A presumptive positive result was obtained on the submitted specimen  and confirmed on repeat testing.  While 2019 novel coronavirus  (SARS-CoV-2) nucleic acids may be present in the submitted sample  additional confirmatory testing may be necessary for epidemiological  and / or clinical management purposes  to differentiate between  SARS-CoV-2 and other  Sarbecovirus currently known to infect humans.  If clinically indicated additional testing with an alternate test  methodology [phone]([id number]) is advised. The SARS-CoV-2 RNA is generally  detectable in upper and lower respiratory sp ecimens during the acute  phase of infection. The expected result is Negative. Fact Sheet for Patients:  BoilerBrush.com.cyhttps://www.fda.gov/media/136312/download Fact Sheet for Healthcare Providers: https://pope.com/https://www.fda.gov/media/136313/download This test is not yet approved or cleared by the [place] FDA and has been authorized for detection and/or diagnosis of SARS-CoV-2 by FDA under an Emergency Use Authorization (EUA).  This EUA will remain in effect (meaning this test can be used) for the duration of the COVID-19 declaration under Section 564(b)(1)  of the Act, 21 U.S.C. section 360bbb-3(b)(1), unless the authorization is terminated or revoked sooner. Performed at [facility], [address] [place], [place] [address]      Studies: Dg Chest Port 1 View  Result Date: 04/26/2019 CLINICAL DATA:  Pneumothorax. EXAM: PORTABLE CHEST 1 VIEW COMPARISON:  04/25/2019. FINDINGS: Mediastinum hilar structures normal. Heart size stable. Aortic stent graft in stable position. Left base atelectasis and tiny bilateral pleural effusions unchanged. Small right apical pneumothorax unchanged. Right posterior rib fracture unchanged. IMPRESSION: 1. Small right apical pneumothorax unchanged. Unchanged right posterior rib fracture. 2. Left base atelectasis and tiny bilateral pleural effusions unchanged. Electronically Signed   By: Maisie Fushomas  Register   On: 04/26/2019 06:16   Dg Chest Port 1 View  Result Date: 04/25/2019 CLINICAL DATA:  Larey SeatFell. Right-sided chest pain. Follow-up pneumothorax. EXAM: PORTABLE CHEST 1 VIEW COMPARISON:  04/24/2019 FINDINGS: The heart is mildly enlarged but stable. Stable tortuosity and ectasia and calcification of the thoracic aorta. Stable TAVR. There is a stable small right apical pneumothorax estimated at 5-10%. Stable eventration of left hemidiaphragm with overlying scarring changes and mild pleural thickening. Stable right posterior rib fractures. IMPRESSION: 1. Stable 5-10% right apical pneumothorax.  No progressive findings. 2. Stable left basilar density, combination of eventration left hemidiaphragm, overlying pulmonary scarring changes and mild left pleural thickening. Electronically Signed   By: Rudie MeyerP.  Gallerani M.D.   On: 04/25/2019 13:01   [place] Carotid  Result Date: 04/25/2019 Carotid Arterial Duplex Study Indications:       Falls. Other Factors:     Aoritic stenosis. COPD on Home O2. Comparison Study:  No prior study on file for comparison. Performing Technologist: Sherren Kernsandace Kanady RVS  Examination Guidelines: A  complete evaluation includes B-mode imaging, spectral Doppler, color Doppler, and power Doppler as needed of all accessible portions of each vessel. Bilateral testing is considered an integral part of a complete examination. Limited examinations for reoccurring indications may be performed as noted.  Right Carotid Findings: +----------+--------+--------+--------+------------+------------------+           PSV cm/sEDV cm/sStenosisDescribe    Comments           +----------+--------+--------+--------+------------+------------------+ CCA Prox  82      15                          intimal thickening +----------+--------+--------+--------+------------+------------------+ CCA Distal71      16                          intimal thickening +----------+--------+--------+--------+------------+------------------+ ICA Prox  58      22              heterogenous                   +----------+--------+--------+--------+------------+------------------+  ICA Distal68      21                                             +----------+--------+--------+--------+------------+------------------+ ECA       65      4                                              +----------+--------+--------+--------+------------+------------------+ +----------+--------+-------+--------+-------------------+           PSV cm/sEDV cmsDescribeArm Pressure (mmHG) +----------+--------+-------+--------+-------------------+ [id number]                                         +----------+--------+-------+--------+-------------------+ +---------+--------+--+--------+--+ VertebralPSV cm/s48EDV cm/s14 +---------+--------+--+--------+--+  Left Carotid Findings: +----------+--------+--------+--------+--------+------------------+           PSV cm/sEDV cm/sStenosisDescribeComments           +----------+--------+--------+--------+--------+------------------+ CCA Prox  63      13                      intimal  thickening +----------+--------+--------+--------+--------+------------------+ CCA Distal59      16                      intimal thickening +----------+--------+--------+--------+--------+------------------+ ICA Prox  45      11              calcific                   +----------+--------+--------+--------+--------+------------------+ ECA       68      2                                          +----------+--------+--------+--------+--------+------------------+ +----------+--------+--------+--------+-------------------+ SubclavianPSV cm/sEDV cm/sDescribeArm Pressure (mmHG) +----------+--------+--------+--------+-------------------+           82                                          +----------+--------+--------+--------+-------------------+ +---------+--------+--+--------+--+ VertebralPSV cm/s47EDV cm/s13 +---------+--------+--+--------+--+  Summary: Right Carotid: The extracranial vessels were near-normal with only minimal wall                thickening or plaque. Left Carotid: The extracranial vessels were near-normal with only minimal wall               thickening or plaque. Vertebrals:  Bilateral vertebral arteries demonstrate antegrade flow. Subclavians: Normal flow hemodynamics were seen in bilateral subclavian              arteries. *See table(s) above for measurements and observations.     Preliminary     Scheduled Meds: . acetaminophen  500 mg Oral TID  . ALPRAZolam  0.5 mg Oral BID  . aspirin  81 mg Oral Daily  . bisoprolol  5 mg Oral Daily  . buPROPion  100 mg Oral q morning - 10a  . cholecalciferol  1,000 Units Oral Daily  . fentaNYL (SUBLIMAZE) injection  50 mcg Intravenous Once  . heparin  5,000 Units Subcutaneous Q8H  . latanoprost  1 drop Both Eyes QHS  . leflunomide  20 mg Oral Daily  . lidocaine  2 patch Transdermal Q24H  . mirtazapine  15 mg Oral QHS  . nortriptyline  25 mg Oral QHS  . oxyCODONE  2.5-5 mg Oral BID  . predniSONE  5 mg  Oral BID WC  . vitamin B-12  1,000 mcg Oral Daily   Continuous Infusions:   Principal Problem:   Pneumothorax Active Problems:   Weakness generalized   Rheumatoid arthritis involving multiple joints (HCC)   DM2 (diabetes mellitus, type 2) (HCC)   Chronic adrenal insufficiency (HCC)   Pulmonary fibrosis (HCC)   COPD (chronic obstructive pulmonary disease) (HCC)   S/P TAVR (transcatheter aortic valve replacement)   Multiple fractures of ribs, right side, initial encounter for closed fracture   Fall at home, initial encounter    Time spent: >35 minutes     Esperanza Sheets  [facility] Pager [phone]. If 7PM-7AM, please contact night-coverage at [email or web address], password [facility] 04/27/2019, 8:41 AM  LOS: 2 days

## 2019-04-27 NOTE — Progress Notes (Signed)
Patient ID: [name], female   DOB: 03-27-1935, 84 y.o.   MRN: [id number]       Subjective: Patient having pain in her chest from her ribs.  Pain meds increased to help.  On 4L [place] this morning.  sats ok when not coughing.  Drop some when coughs.  Does not have IS.  Tried to do flutter valve and has barely any breath to perform task.  Objective: Vital signs in last 24 hours: Temp:  [97.7 F (36.5 C)-98.5 F (36.9 C)] 97.9 F (36.6 C) (07/07 0332) Pulse Rate:  [79-98] 93 (07/07 0712) Resp:  [17-21] 21 (07/07 0712) BP: (118-166)/(59-79) 143/59 (07/07 0332) SpO2:  [84 %-100 %] 100 % (07/07 0712) Last BM Date: 04/24/19  Intake/Output from previous day: 07/06 0701 - 07/07 0700 In: 650 [P.O.:650] Out: 900 [Urine:900] Intake/Output this shift: No intake/output data recorded.  PE: Gen: NAD Heart: regular Lungs: CTAB, decrease at bases.  On 4L [place] Abd: soft, NT, ND, +BS  Lab Results:  Recent Labs    04/25/19 0826 04/26/19 0413  WBC 9.8 11.7*  HGB 13.3 12.7  HCT 42.2 39.2  PLT 148* 142*   BMET Recent Labs    04/25/19 0826 04/26/19 0413  NA 142 141  K 3.6 4.0  CL 104 103  CO2 29 27  GLUCOSE 112* 93  BUN 13 8  CREATININE 0.70 0.52  CALCIUM 9.2 8.6*   PT/INR No results for input(s): LABPROT, INR in the last 72 hours. CMP     Component Value Date/Time   NA 141 04/26/2019 0413   K 4.0 04/26/2019 0413   CL 103 04/26/2019 0413   CO2 27 04/26/2019 0413   GLUCOSE 93 04/26/2019 0413   BUN 8 04/26/2019 0413   CREATININE 0.52 04/26/2019 0413   CREATININE 0.64 03/23/2014 1429   CALCIUM 8.6 (L) 04/26/2019 0413   CALCIUM 8.6 11/15/2011 0445   PROT 5.7 (L) 04/25/2019 0826   ALBUMIN 3.2 (L) 04/25/2019 0826   AST 16 04/25/2019 0826   ALT 13 04/25/2019 0826   ALKPHOS 61 04/25/2019 0826   BILITOT 0.8 04/25/2019 0826   GFRNONAA >60 04/26/2019 0413   GFRAA >60 04/26/2019 0413   Lipase     Component Value Date/Time   LIPASE 25 10/01/2011 1140        Studies/Results: Dg Chest Port 1 View  Result Date: 04/26/2019 CLINICAL DATA:  Pneumothorax. EXAM: PORTABLE CHEST 1 VIEW COMPARISON:  04/25/2019. FINDINGS: Mediastinum hilar structures normal. Heart size stable. Aortic stent graft in stable position. Left base atelectasis and tiny bilateral pleural effusions unchanged. Small right apical pneumothorax unchanged. Right posterior rib fracture unchanged. IMPRESSION: 1. Small right apical pneumothorax unchanged. Unchanged right posterior rib fracture. 2. Left base atelectasis and tiny bilateral pleural effusions unchanged. Electronically Signed   By: Maisie Fus  Register   On: 04/26/2019 06:16   Dg Chest Port 1 View  Result Date: 04/25/2019 CLINICAL DATA:  Larey Seat. Right-sided chest pain. Follow-up pneumothorax. EXAM: PORTABLE CHEST 1 VIEW COMPARISON:  04/24/2019 FINDINGS: The heart is mildly enlarged but stable. Stable tortuosity and ectasia and calcification of the thoracic aorta. Stable TAVR. There is a stable small right apical pneumothorax estimated at 5-10%. Stable eventration of left hemidiaphragm with overlying scarring changes and mild pleural thickening. Stable right posterior rib fractures. IMPRESSION: 1. Stable 5-10% right apical pneumothorax.  No progressive findings. 2. Stable left basilar density, combination of eventration left hemidiaphragm, overlying pulmonary scarring changes and mild left pleural thickening. Electronically Signed  By: Marijo Sanes M.D.   On: 04/25/2019 13:01   Vas US Carotid  Result Date: 04/25/2019 Carotid Arterial Duplex Study Indications:       Falls. Other Factors:     Aoritic stenosis. COPD on Home O2. Comparison Study:  No prior study on file for comparison. Performing Technologist: Sharion Dove RVS  Examination Guidelines: A complete evaluation includes B-mode imaging, spectral Doppler, color Doppler, and power Doppler as needed of all accessible portions of each vessel. Bilateral testing is considered an  integral part of a complete examination. Limited examinations for reoccurring indications may be performed as noted.  Right Carotid Findings: +----------+--------+--------+--------+------------+------------------+             PSV cm/s EDV cm/s Stenosis Describe     Comments            +----------+--------+--------+--------+------------+------------------+  CCA Prox   82       15                             intimal thickening  +----------+--------+--------+--------+------------+------------------+  CCA Distal 71       16                             intimal thickening  +----------+--------+--------+--------+------------+------------------+  ICA Prox   58       22                heterogenous                     +----------+--------+--------+--------+------------+------------------+  ICA Distal 68       21                                                 +----------+--------+--------+--------+------------+------------------+  ECA        65       4                                                  +----------+--------+--------+--------+------------+------------------+ +----------+--------+-------+--------+-------------------+             PSV cm/s EDV cms Describe Arm Pressure (mmHG)  +----------+--------+-------+--------+-------------------+  Subclavian 44                                             +----------+--------+-------+--------+-------------------+ +---------+--------+--+--------+--+  Vertebral PSV cm/s 48 EDV cm/s 14  +---------+--------+--+--------+--+  Left Carotid Findings: +----------+--------+--------+--------+--------+------------------+             PSV cm/s EDV cm/s Stenosis Describe Comments            +----------+--------+--------+--------+--------+------------------+  CCA Prox   63       13                         intimal thickening  +----------+--------+--------+--------+--------+------------------+  CCA Distal 59       16  intimal thickening   +----------+--------+--------+--------+--------+------------------+  ICA Prox   45       11                calcific                     +----------+--------+--------+--------+--------+------------------+  ECA        68       2                                              +----------+--------+--------+--------+--------+------------------+ +----------+--------+--------+--------+-------------------+  Subclavian PSV cm/s EDV cm/s Describe Arm Pressure (mmHG)  +----------+--------+--------+--------+-------------------+             82                                              +----------+--------+--------+--------+-------------------+ +---------+--------+--+--------+--+  Vertebral PSV cm/s 47 EDV cm/s 13  +---------+--------+--+--------+--+  Summary: Right Carotid: The extracranial vessels were near-normal with only minimal wall                thickening or plaque. Left Carotid: The extracranial vessels were near-normal with only minimal wall               thickening or plaque. Vertebrals:  Bilateral vertebral arteries demonstrate antegrade flow. Subclavians: Normal flow hemodynamics were seen in bilateral subclavian              arteries. *See table(s) above for measurements and observations.     Preliminary     Anti-infectives: Anti-infectives (From admission, onward)   None       Assessment/Plan S/p fall Right rib fx x2 - pulm toilet, pain control, IS, mobilize with PT/OT Small right pneumothorax - stable, still very small. RA - per medicine Diabetes mellitus 2 - per medicine COPD - pt states she is not on home o2, on 4L now up from 2L yesterday Chronic adrenal insufficiency - per medicine FEN - carb mod diet VTE - heparin ID - none   LOS: 2 days    Letha Cape , [facility] Surgery 04/27/2019, 8:44 AM Pager: [phone]

## 2019-04-27 NOTE — Progress Notes (Signed)
Pt given IS. Pt educated with teach back on how to use, pt voiced understanding. Pt reached 500 on IS. Will continue to monitor.  Amanda Cockayne, RN

## 2019-04-27 NOTE — Progress Notes (Signed)
CBG 67 at 6:22 am. Hypoglycemic standing order initiated.Apple juice given 4 oZ. Pt felt weak and lethargic.Vital remained stable. Continue follow CBG q 15 minutes until > 70 mg/dl.   Kennyth Lose, RN

## 2019-04-28 DIAGNOSIS — S2241XA Multiple fractures of ribs, right side, initial encounter for closed fracture: Secondary | ICD-10-CM

## 2019-04-28 DIAGNOSIS — Z952 Presence of prosthetic heart valve: Secondary | ICD-10-CM

## 2019-04-28 DIAGNOSIS — J42 Unspecified chronic bronchitis: Secondary | ICD-10-CM

## 2019-04-28 DIAGNOSIS — J8410 Pulmonary fibrosis, unspecified: Secondary | ICD-10-CM

## 2019-04-28 DIAGNOSIS — J9383 Other pneumothorax: Secondary | ICD-10-CM

## 2019-04-28 LAB — GLUCOSE, CAPILLARY
Glucose-Capillary: 78 mg/dL (ref 70–99)
Glucose-Capillary: 143 mg/dL — ABNORMAL HIGH (ref 70–99)
Glucose-Capillary: 134 mg/dL — ABNORMAL HIGH (ref 70–99)
Glucose-Capillary: 147 mg/dL — ABNORMAL HIGH (ref 70–99)

## 2019-04-28 MED ORDER — ATORVASTATIN CALCIUM 20 MG PO TABS: 20.0000 mg | ORAL_TABLET | Freq: Every day | ORAL | Status: AC

## 2019-04-28 MED ORDER — POLYETHYLENE GLYCOL 3350 17 G PO PACK: 17.0000 g | PACK | Freq: Every day | ORAL | 0 refills | Status: AC

## 2019-04-28 MED ORDER — ALPRAZOLAM 1 MG PO TABS: 0.5000 mg | ORAL_TABLET | Freq: Two times a day (BID) | ORAL | 0 refills | Status: AC

## 2019-04-28 MED ORDER — ACETAMINOPHEN 500 MG PO TABS: 500.0000 mg | ORAL_TABLET | Freq: Four times a day (QID) | ORAL | 0 refills | Status: AC | PRN

## 2019-04-28 MED ORDER — OXYCODONE HCL 5 MG PO TABS: 2.5000 mg | ORAL_TABLET | Freq: Two times a day (BID) | ORAL | 0 refills | Status: DC | PRN

## 2019-04-28 MED ORDER — ASPIRIN 81 MG PO CHEW: 81.0000 mg | CHEWABLE_TABLET | Freq: Every day | ORAL | Status: AC

## 2019-04-28 NOTE — Progress Notes (Signed)
Progress Note  Patient Name: [name] Date of Encounter: 04/28/2019  Primary Cardiologist: [name], MD   Subjective   Sitting up in bed eating breakfast. Looks to have a good appetite. She denies CP and dyspnea. She is still a bit sore from her fall.   Inpatient Medications    Scheduled Meds:  acetaminophen  500 mg Oral TID   ALPRAZolam  0.5 mg Oral BID   aspirin  81 mg Oral Daily   atorvastatin  20 mg Oral q1800   bisacodyl  10 mg Oral Daily   bisoprolol  5 mg Oral Daily   buPROPion  100 mg Oral q morning - 10a   cholecalciferol  1,000 Units Oral Daily   fentaNYL (SUBLIMAZE) injection  50 mcg Intravenous Once   furosemide  20 mg Oral Daily   heparin  5,000 Units Subcutaneous Q8H   latanoprost  1 drop Both Eyes QHS   leflunomide  20 mg Oral Daily   lidocaine  2 patch Transdermal Q24H   mirtazapine  15 mg Oral QHS   nortriptyline  25 mg Oral QHS   oxyCODONE  2.5-5 mg Oral BID   polyethylene glycol  17 g Oral Daily   predniSONE  5 mg Oral BID WC   vitamin B-12  1,000 mcg Oral Daily   Continuous Infusions:  PRN Meds: acetaminophen, ketorolac, ondansetron **OR** ondansetron (ZOFRAN) IV, oxyCODONE   Vital Signs    Vitals:   04/28/19 0703 04/28/19 0704 04/28/19 0821 04/28/19 0852  BP: (!) 156/71 (!) 157/85 (!) 133/92   Pulse: 94 93 88 84  Resp: (!) 22 (!) 22 19 20   Temp:   98.3 F (36.8 C)   TempSrc:   Oral   SpO2: 95% 98% 98% 95%  Weight:      Height:        Intake/Output Summary (Last 24 hours) at 04/28/2019 0942 Last data filed at 04/28/2019 8718 Gross per 24 hour  Intake 250 ml  Output 650 ml  Net -400 ml   Last 3 Weights 04/25/2019 07/24/2018 11/24/2017  Weight (lbs) 122 lb 12.7 oz 119 lb 108 lb 0.4 oz  Weight (kg) 55.7 kg 53.978 kg 49 kg      Telemetry    NSR 90s. 7 beat run of NSVT - Personally Reviewed  ECG    Not performed today but admit EKG showed sinus tach 101 bpm w/  inferior lateral ST depressions, similar  to prior EKGs in January- Personally Reviewed  Physical Exam   GEN: Elderly WF, dentition in poor repair No acute distress.   Neck: No JVD Cardiac: RRR, murmur noted, loudest at LLSB and near apex, 2/6 Respiratory: Clear to auscultation bilaterally. GI: Soft, nontender, non-distended  MS: both lower extremities are wrapped in ACE bandages from ankle to distal knee, no edema present Neuro:  Nonfocal  Psych: Normal affect   Labs    High Sensitivity Troponin:   Recent Labs  Lab 04/24/19 2309 04/25/19 1833 04/25/19 1917  TROPONINIHS 50* 134* 131*      Cardiac EnzymesNo results for input(s): TROPONINI in the last 168 hours. No results for input(s): TROPIPOC in the last 168 hours.   Chemistry Recent Labs  Lab 04/24/19 2309 04/25/19 0826 04/26/19 0413  NA 143 142 141  K 4.1 3.6 4.0  CL 107 104 103  CO2 27 29 27   GLUCOSE 108* 112* 93  BUN 21 13 8   CREATININE 0.73 0.70 0.52  CALCIUM 9.0 9.2 8.6*  PROT  --  5.7*  --   ALBUMIN  --  3.2*  --   AST  --  16  --   ALT  --  13  --   ALKPHOS  --  61  --   BILITOT  --  0.8  --   GFRNONAA >60 >60 >60  GFRAA >60 >60 >60  ANIONGAP 9 9 11      Hematology Recent Labs  Lab 04/24/19 2309 04/25/19 0826 04/26/19 0413  WBC 10.3 9.8 11.7*  RBC 3.85* 4.32 4.09  HGB 11.8* 13.3 12.7  HCT 38.1 42.2 39.2  MCV 99.0 97.7 95.8  MCH 30.6 30.8 31.1  MCHC 31.0 31.5 32.4  RDW 13.9 13.7 13.7  PLT 153 148* 142*    BNPNo results for input(s): BNP, PROBNP in the last 168 hours.   DDimer No results for input(s): DDIMER in the last 168 hours.   Radiology    No results found.  Cardiac Studies   2D Echo 04/26/2019  1. Moderate hypokinesis of the left ventricular, basal-mid inferoseptal wall and inferior wall.  2. Moderate hypokinesis of the left ventricular, entire anteroseptal wall.  3. The left ventricle has moderately reduced systolic function, with an ejection fraction of 35-40%. The cavity size was normal. There is moderate  concentric left ventricular hypertrophy. Left ventricular diastolic Doppler parameters are consistent with  impaired relaxation. Elevated left atrial and left ventricular end-diastolic pressures The E/e' is 12. Left ventricular diffuse hypokinesis.  4. The right ventricle has normal systolic function. The cavity was normal. There is no increase in right ventricular wall thickness. Right ventricular systolic pressure is normal.  5. Left atrial size was moderately dilated.  6. Small pericardial effusion.  7. Mild thickening of the mitral valve leaflet. There is mild to moderate mitral annular calcification present. No evidence of mitral valve stenosis.  8. Tricuspid valve regurgitation is moderate.  9. A 93mm Medtronic-Hall CoreValve valve is present in the aortic position. Echo findings are consistent with perivalvular leak of the aortic prosthesis. 10. The interatrial septum was not well visualized.  Patient Profile     TYLISHA DANIS is a 84 y.o. female with a hx of aortic stenosis s/p TAVR at [facility] in 2014, COPD on chronic oxygen, rheumatoid arthritis, pulmonary fibrosis, adrenal insufficiency, anxiety disorder and gait instability with previous falls who is being seen today for the evaluation of congestive heart failure at the request of Dr. 2015. Note: pt primary presented to the hospital on 7/4 after mechanical fall and found to have acute displaced right 7th rib fx, possible nondisplaced right 9th rib fracture and small pneumothorax. Had abnormal Hs Trop leading to echocardiogram.   Assessment & Plan    1. Traumatic Mechanical Fall Resulting in Rt Sided Rib Fx x2 and Subsequent Small Rt Pneumothorax: management per Trauma team. Due to small size of pneumothorax, no intervention has been needed.   2. New Systolic HF/ Cardiomyopathy of Unknown Etiology: reportedly has had no recent s/s of acute HF. 2D echo was done as part of initial w/u after fall at home and [facility] Trop returned abnormal. LVEF  now 35-40% (down from 50-55% in 10/2017). Moderate LV hypokinesis of basal-mid inferoseptal wall, inferior wall and entire anterior septal wall, moderate concentric left ventricular hypertrophy and also LV diastolic dysfunction also noted. Given new RWMAs, cannot r/o possibly of coronary ischemia. She had no obstructive CAD on preop cath prior to TAVR in 2014. However at this point, based on absence of ischemic  symptoms and acute HF symptoms, plan for now is to treat medically. Will defer further management to her primary cardiologist, Dr. Debara Pickett, who will see today. For now will continue on  blocker. We will hold of on addition of an ACE/ARB due to orthostatic hypotension. She had significant drop in SBP upon standing from seated position, from 157>>107 mmHg (see below). Luckily, no signs of volume overload and no indication for diuretic at this time.  3. H/o Aortic Stenosis w/ AVR: s/p TAVR at [facility] in 2014. Perivalvular leak noted on echo, but also noted on prior study. No change in therapy.   4. Orthostatic Hypotension: She had significant drop in SBP upon standing from seated position, from 157>>107 mmHg (see below). Will continue low dose  blocker for now but will avoid adding additional HF meds. Recommend use of compression stockings. Has had frequent falls at home, per pt report, and lives home along. She needs to rise slowing from seated position. Agree w/ plans to send to SNF.   Orthostatic VS for the past 24 hrs:  BP- Lying Pulse- Lying BP- Sitting Pulse- Sitting BP- Standing at 0 minutes Pulse- Standing at 0 minutes  04/28/19 0706 -- -- -- -- 107/81 --  04/28/19 0703 156/71 98 157/85 94 107/81 93    5. NSVT: 7 beat run noted on tele. In the setting of reduced LVF. Continue on  blocker and keep electrolytes stable.    For questions or updates, please contact [place] Please consult [email or web address] for contact info under        Signed, Lyda Jester, PA-C  04/28/2019, 9:42 AM

## 2019-04-28 NOTE — Discharge Summary (Addendum)
Physician Discharge Summary  [name] VOZ:[id number] DOB: 1935/01/17 DOA: 04/24/2019  PCP: Jarome Matin, MD  Admit date: 04/24/2019 Discharge date: 05/01/2019  Recommendations for Outpatient Follow-up:  Mechanical fall at home resulting in traumatic right-sided rib fractures and 10% apical pneumothorax on the right. --Conservative management recommended by trauma surgery including pulmonary toilet, incentive spirometry (pulling 500), flutter valve, Tylenol  COPD, acute hypoxic respiratory failure secondary to rib fractures, history of pulmonary fibrosis. --Oxygen saturation stable on 1 L nasal cannula.  Wean as tolerated.  PMH gait instability with history of multiple falls --Xanax, Remeron, nortriptyline listed as an outpatient medication, consider weaning this as an outpatient if able --Orthostatic hypotension noted when standing.  Recommend slow positional changes especially when standing.  Maintain hydration.  Will not initiate ACE inhibitor at this time  New diagnosis of left ventricular systolic dysfunction LVEF 35-40% with elevated troponins.  Troponins mildly elevated.  No signs or symptoms of acute CHF or ACS. --Started on daily aspirin.  Continue beta-blocker, statin.  No indication for Lasix  --Consider ACE inhibitor as an outpatient but would follow blood pressure closely and rule out intermittent orthostatic hypotension prior to starting this.  Chronic lower extremity edema left greater than right for which she has wrapped her legs for several months, thought to be secondary to venous insufficiency.   --Appears minimal.  Previously on Lasix but this was discontinued as an outpatient.  Modest thrombocytopenia, seen 2014, 2018, 2019, appears to be intermittent in nature.  Follow-up as an outpatient if clinically indicated.  Follow-up Information    Jarome Matin, MD. Schedule an appointment as soon as possible for a visit in 2 week(s).   Specialty: Internal  Medicine Contact information: [address] [place] [address] [phone]        [name], MD .   Specialty: Cardiology Contact information: [address] [place] [address] [place] [address] [phone]            Discharge Diagnoses: Principal diagnosis is #1 1. Mechanical fall at home resulting in traumatic right-sided rib fractures and 10% apical pneumothorax on the right 2. COPD, acute hypoxic respiratory failure secondary to rib fractures, history of pulmonary fibrosis. 3. Diabetes mellitus type 2, hemoglobin A1c last check 5.0 4. Aortic stenosis status post TAVR 2014  5. Rheumatoid arthritis 6. PMH gait instability with history of multiple falls 7. Chronic adrenal insufficiency 8. New diagnosis of left ventricular systolic dysfunction LVEF 35-40% with elevated troponins 9. Chronic lower extremity edema left greater than right for which she has wrapped her legs for several months, thought to be secondary to venous insufficiency.   10. Chronic thrombocytopenia.   Discharge Condition: improved Disposition: SNF  Diet recommendation: Heart healthy, diabetic diet  Filed Weights   04/25/19 0653  Weight: 55.7 kg    History of present illness:  84 year old woman lives at home PMH aortic stenosis, COPD, chronic hypoxic respiratory failure on oxygen, rheumatoid arthritis, pulmonary fibrosis, gait instability with history of previous falls; presented via EMS 7/5 after a fall at home described as mechanical, did not strike head, no loss of consciousness reported.  Found to have right-sided rib fractures seventh and eighth ribs as well as 10% right pneumothorax of the apex.  Increased oxygen requirement.  Transferred to [facility] for trauma surgery evaluation.    Hospital Course:  Trauma surgery recommended conservative management.  Patient evaluated by physical and occupational therapy with recommendation for SNF.  Troponin was mildly elevated,  echocardiogram showed new  LV dysfunction and cardiology was consulted.  Cardiology recommended continued medical management, no further testing.  Physical and occupational therapy recommended short-term SNF.  Oxygenation stable on 1 L, stable for transfer to skilled nursing facility.  Hospitalization prolonged by need to wait for insurance authorization, location of facility and repeat SARS-CoV-2 testing.  Individual issues as below.  Mechanical fall at home resulting in traumatic right-sided rib fractures and 10% apical pneumothorax on the right. --Conservative management recommended by trauma surgery including pulmonary toilet, incentive spirometry (pulling 500), flutter valve, Tylenol --Trauma surgery has recommended discharge to skilled nursing facility  --Continue bowel regimen, minimize narcotics though still having significant pain and tolerating low-dose narcotics here.  COPD, acute hypoxic respiratory failure secondary to rib fractures, history of pulmonary fibrosis..  Patient reports previously being on nasal cannula oxygen in the context of pneumonia. --Oxygen saturation stable on 1 L nasal cannula.  Wean as tolerated.  Diabetes mellitus type 2, hemoglobin A1c last check 5.0 --Continue diet control.  No need for pharmacotherapy at this time.  Aortic stenosis status post TAVR 2014  --Perivalvular leak noted but per cardiology this was evident on previous echocardiogram and no change in therapy was recommended.  Rheumatoid arthritis --  Appears stable.  Continue prednisone.  PMH gait instability with history of multiple falls --Xanax, Remeron, nortriptyline listed as an outpatient medication, consider weaning this as an outpatient if able --Orthostatic hypotension noted when standing.  Recommend slow positional changes especially when standing.  Maintain hydration.  Will not initiate ACE inhibitor at this time  Chronic adrenal insufficiency --Continue oral replacement  New  diagnosis of left ventricular systolic dysfunction LVEF 35-40% with elevated troponins.  Troponins mildly elevated.  No signs or symptoms of acute CHF or ACS. --Cardiology evaluated the patient.  No invasive work-up recommended.  Started on daily aspirin.  Continue beta-blocker, statin.  No indication for Lasix at this point. High sensitive troponins: 50, 134, 131 --Consider ACE inhibitor as an outpatient but would follow blood pressure closely and rule out intermittent orthostatic hypotension prior to starting this.  Chronic lower extremity edema left greater than right for which she has wrapped her legs for several months, thought to be secondary to venous insufficiency.   --Appears minimal.  Previously on Lasix but this was discontinued as an outpatient.  Modest thrombocytopenia, seen 2014, 2018, 2019, appears to be intermittent in nature.  Follow-up as an outpatient if clinically indicated.  Consultants   Trauma surgery  Cardiology  Procedures   Echo IMPRESSIONS   1. Moderate hypokinesis of the left ventricular, basal-mid inferoseptal wall and inferior wall. 2. Moderate hypokinesis of the left ventricular, entire anteroseptal wall. 3. The left ventricle has moderately reduced systolic function, with an ejection fraction of 35-40%. The cavity size was normal. There is moderate concentric left ventricular hypertrophy. Left ventricular diastolic Doppler parameters are consistent with impaired relaxation. Elevated left atrial and left ventricular end-diastolic pressures The E/e' is 52. Left ventricular diffuse hypokinesis. 4. The right ventricle has normal systolic function. The cavity was normal. There is no increase in right ventricular wall thickness. Right ventricular systolic pressure is normal. 5. Left atrial size was moderately dilated. 6. Small pericardial effusion. 7. Mild thickening of the mitral valve leaflet. There is mild to moderate mitral annular calcification  present. No evidence of mitral valve stenosis. 8. Tricuspid valve regurgitation is moderate. 9. A 29mm Medtronic-Hall CoreValve valve is present in the aortic position. Echo findings are consistent with perivalvular leak of the aortic prosthesis. 10.  The interatrial septum was not well visualized.  SUMMARY  Severely reduced LV EF, changed from prior. Diffuse hypokinesis, with significant septal and basal inferior hypokinesis. SS/P TAVR, with mild perivalvular leak.  7/8 assessment from original discharge summary: (For assessment 7/11 see progress note same day) S: Feels well, breathing okay.  Only issue is pain on her right side especially with coughing or taking a deep breath.  Patient does not want me to call any family members.  I asked this several times. O: Vitals:  Vitals:   05/01/19 0944 05/01/19 0946  BP:  (!) 108/44  Pulse:  60  Resp:  16  Temp: (!) 97.5 F (36.4 C) (!) 97.5 F (36.4 C)  SpO2:      Constitutional:   Appears calm and comfortable Respiratory:   CTA bilaterally, some decreased breath sounds on the right, no w/r/r.   Respiratory effort normal.  Cardiovascular:   RRR, no m/r/g  No significant LE extremity edema   Psychiatric:   judgement and insight appear normal  Mental status o Oriented to person, location, year, "June", spontaneously reports correct reason leading to hospitalization   Discharge Instructions   Allergies as of 05/01/2019      Reactions   Penicillins Hives   Has patient had a PCN reaction causing immediate rash, facial/tongue/throat swelling, SOB or lightheadedness with hypotension: No Has patient had a PCN reaction causing severe rash involving mucus membranes or skin necrosis: No Has patient had a PCN reaction that required hospitalization No Has patient had a PCN reaction occurring within the last 10 years: No If all of the above answers are "NO", then may proceed with Cephalosporin use.   Tape Other (See Comments)    Skin peels up.  Skin peels up.       Medication List    STOP taking these medications   furosemide 20 MG tablet Commonly known as: Lasix   HYDROcodone-acetaminophen 5-325 MG tablet Commonly known as: NORCO/VICODIN   ondansetron 4 MG tablet Commonly known as: ZOFRAN   QUEtiapine 25 MG tablet Commonly known as: SEROQUEL     TAKE these medications   acetaminophen 500 MG tablet Commonly known as: TYLENOL Take 1 tablet (500 mg total) by mouth every 6 (six) hours as needed for mild pain or moderate pain. What changed: reasons to take this   ALPRAZolam 1 MG tablet Commonly known as: XANAX Take 0.5 tablets (0.5 mg total) by mouth 2 (two) times daily. Anxiety   aspirin 81 MG chewable tablet Chew 1 tablet (81 mg total) by mouth daily.   atorvastatin 20 MG tablet Commonly known as: LIPITOR Take 1 tablet (20 mg total) by mouth daily at 6 PM.   bisoprolol 5 MG tablet Commonly known as: ZEBETA Take 0.5 tablets (2.5 mg total) by mouth daily.   buPROPion 100 MG tablet Commonly known as: WELLBUTRIN Take 100 mg by mouth every morning.   cholecalciferol 25 MCG (1000 UT) tablet Commonly known as: VITAMIN D3 Take 1,000 Units by mouth daily.   leflunomide 20 MG tablet Commonly known as: ARAVA Take 1 tablet (20 mg total) by mouth daily.   mirtazapine 30 MG tablet Commonly known as: REMERON Take 30 mg by mouth at bedtime.   nortriptyline 25 MG capsule Commonly known as: PAMELOR Take 1 capsule (25 mg total) by mouth at bedtime.   oxyCODONE 5 MG immediate release tablet Commonly known as: Oxy IR/ROXICODONE Take 0.5 tablets (2.5 mg total) by mouth 2 (two) times daily as needed for  up to 3 days for severe pain.   polyethylene glycol 17 g packet Commonly known as: MIRALAX / GLYCOLAX Take 17 g by mouth daily.   predniSONE 5 MG tablet Commonly known as: DELTASONE Take 5 mg by mouth 2 (two) times daily with a meal. What changed: Another medication with the same name was  removed. Continue taking this medication, and follow the directions you see here.   Travatan Z 0.004 % Soln ophthalmic solution Generic drug: Travoprost (BAK Free) Place 1 drop into both eyes at bedtime.   vitamin B-12 1000 MCG tablet Commonly known as: CYANOCOBALAMIN Take 1 tablet (1,000 mcg total) by mouth daily.      Allergies  Allergen Reactions   Penicillins Hives    Has patient had a PCN reaction causing immediate rash, facial/tongue/throat swelling, SOB or lightheadedness with hypotension: No Has patient had a PCN reaction causing severe rash involving mucus membranes or skin necrosis: No Has patient had a PCN reaction that required hospitalization No Has patient had a PCN reaction occurring within the last 10 years: No If all of the above answers are "NO", then may proceed with Cephalosporin use.    Tape Other (See Comments)    Skin peels up.  Skin peels up.     The results of significant diagnostics from this hospitalization (including imaging, microbiology, ancillary and laboratory) are listed below for reference.    Significant Diagnostic Studies: Ct Head Wo Contrast  Result Date: 04/25/2019 CLINICAL DATA:  Fall landing on right side. EXAM: CT HEAD WITHOUT CONTRAST CT CERVICAL SPINE WITHOUT CONTRAST TECHNIQUE: Multidetector CT imaging of the head and cervical spine was performed following the standard protocol without intravenous contrast. Multiplanar CT image reconstructions of the cervical spine were also generated. COMPARISON:  Head CT 10/30/2017, head and cervical spine CT 10/14/2015 FINDINGS: CT HEAD FINDINGS Brain: No intracranial hemorrhage, mass effect, or midline shift. Stable degree of atrophy and chronic small vessel ischemia from prior exam. Small remote occipital infarct. No hydrocephalus. The basilar cisterns are patent. No evidence of territorial infarct or acute ischemia. No extra-axial or intracranial fluid collection. Vascular: Atherosclerosis of skullbase  vasculature without hyperdense vessel or abnormal calcification. Skull: No fracture or focal lesion. Sinuses/Orbits: Paranasal sinuses and mastoid air cells are clear. The visualized orbits are unremarkable. Bilateral cataract resection. Other: None. CT CERVICAL SPINE FINDINGS Mild motion artifact. Alignment: Exaggerated cervical lordosis. No traumatic subluxation. Unchanged mild retrolisthesis of C3 on C4, degenerative. Skull base and vertebrae: No acute fracture. Dens and skull base are intact. None fusion posterior elements of C2, congenital. Slight loss of height of T1 vertebral body is chronic and unchanged. Soft tissues and spinal canal: No prevertebral fluid or swelling. No visible canal hematoma. Disc levels: Multilevel degenerative disc disease most prominent at C4-C5. Multilevel facet hypertrophy. Upper chest: Small right apically pneumothorax as seen on chest radiograph earlier this day. Other: Carotid calcifications. IMPRESSION: 1. No acute intracranial abnormality. No skull fracture. Stable atrophy and chronic small vessel ischemia. 2. Multilevel degenerative change in the cervical spine without acute fracture or subluxation. 3. Small right apical pneumothorax as seen on chest radiograph earlier this day. Electronically Signed   By: Narda RutherfordMelanie  Sanford M.D.   On: 04/25/2019 01:42   Ct Cervical Spine Wo Contrast  Result Date: 04/25/2019 CLINICAL DATA:  Fall landing on right side. EXAM: CT HEAD WITHOUT CONTRAST CT CERVICAL SPINE WITHOUT CONTRAST TECHNIQUE: Multidetector CT imaging of the head and cervical spine was performed following the standard protocol without intravenous contrast.  Multiplanar CT image reconstructions of the cervical spine were also generated. COMPARISON:  Head CT 10/30/2017, head and cervical spine CT 10/14/2015 FINDINGS: CT HEAD FINDINGS Brain: No intracranial hemorrhage, mass effect, or midline shift. Stable degree of atrophy and chronic small vessel ischemia from prior exam. Small  remote occipital infarct. No hydrocephalus. The basilar cisterns are patent. No evidence of territorial infarct or acute ischemia. No extra-axial or intracranial fluid collection. Vascular: Atherosclerosis of skullbase vasculature without hyperdense vessel or abnormal calcification. Skull: No fracture or focal lesion. Sinuses/Orbits: Paranasal sinuses and mastoid air cells are clear. The visualized orbits are unremarkable. Bilateral cataract resection. Other: None. CT CERVICAL SPINE FINDINGS Mild motion artifact. Alignment: Exaggerated cervical lordosis. No traumatic subluxation. Unchanged mild retrolisthesis of C3 on C4, degenerative. Skull base and vertebrae: No acute fracture. Dens and skull base are intact. None fusion posterior elements of C2, congenital. Slight loss of height of T1 vertebral body is chronic and unchanged. Soft tissues and spinal canal: No prevertebral fluid or swelling. No visible canal hematoma. Disc levels: Multilevel degenerative disc disease most prominent at C4-C5. Multilevel facet hypertrophy. Upper chest: Small right apically pneumothorax as seen on chest radiograph earlier this day. Other: Carotid calcifications. IMPRESSION: 1. No acute intracranial abnormality. No skull fracture. Stable atrophy and chronic small vessel ischemia. 2. Multilevel degenerative change in the cervical spine without acute fracture or subluxation. 3. Small right apical pneumothorax as seen on chest radiograph earlier this day. Electronically Signed   By: Narda Rutherford M.D.   On: 04/25/2019 01:42   Dg Chest Port 1 View  Result Date: 04/26/2019 CLINICAL DATA:  Pneumothorax. EXAM: PORTABLE CHEST 1 VIEW COMPARISON:  04/25/2019. FINDINGS: Mediastinum hilar structures normal. Heart size stable. Aortic stent graft in stable position. Left base atelectasis and tiny bilateral pleural effusions unchanged. Small right apical pneumothorax unchanged. Right posterior rib fracture unchanged. IMPRESSION: 1. Small right  apical pneumothorax unchanged. Unchanged right posterior rib fracture. 2. Left base atelectasis and tiny bilateral pleural effusions unchanged. Electronically Signed   By: Maisie Fus  Register   On: 04/26/2019 06:16   Dg Chest Port 1 View  Result Date: 04/25/2019 CLINICAL DATA:  Larey Seat. Right-sided chest pain. Follow-up pneumothorax. EXAM: PORTABLE CHEST 1 VIEW COMPARISON:  04/24/2019 FINDINGS: The heart is mildly enlarged but stable. Stable tortuosity and ectasia and calcification of the thoracic aorta. Stable TAVR. There is a stable small right apical pneumothorax estimated at 5-10%. Stable eventration of left hemidiaphragm with overlying scarring changes and mild pleural thickening. Stable right posterior rib fractures. IMPRESSION: 1. Stable 5-10% right apical pneumothorax.  No progressive findings. 2. Stable left basilar density, combination of eventration left hemidiaphragm, overlying pulmonary scarring changes and mild left pleural thickening. Electronically Signed   By: Rudie Meyer M.D.   On: 04/25/2019 13:01   Dg Chest Portable 1 View  Result Date: 04/25/2019 CLINICAL DATA:  Fall diminished lung sounds on the right side EXAM: PORTABLE CHEST 1 VIEW COMPARISON:  11/20/2017, 10/30/2017 FINDINGS: Hazy opacity at the right lung base. Possible focal airspace disease or tiny effusion at the left base. Borderline cardiomegaly. Aortic graft similar in position. Aortic atherosclerosis. Small right apical pneumothorax, demonstrating 12 mm pleural-parenchymal separation at the apex, estimated at 10%. Acute right seventh displaced rib fracture. Possible nondisplaced right ninth rib fracture. Negative for midline shift. IMPRESSION: 1. Small right apical pneumothorax, estimated at 10% 2. Acute displaced right seventh rib fracture and possible nondisplaced right ninth rib fracture Critical Value/emergent results were called by telephone at the time of  interpretation on 04/25/2019 at 12:38 am to Dr. Cy Blamer , who  verbally acknowledged these results. Electronically Signed   By: Jasmine Pang M.D.   On: 04/25/2019 00:38   Vas US Carotid  Result Date: 04/27/2019 Carotid Arterial Duplex Study Indications:       Falls. Other Factors:     Aoritic stenosis. COPD on Home O2. Comparison Study:  No prior study on file for comparison. Performing Technologist: Sherren Kerns RVS  Examination Guidelines: A complete evaluation includes B-mode imaging, spectral Doppler, color Doppler, and power Doppler as needed of all accessible portions of each vessel. Bilateral testing is considered an integral part of a complete examination. Limited examinations for reoccurring indications may be performed as noted.  Right Carotid Findings: +----------+--------+--------+--------+------------+------------------+             PSV cm/s EDV cm/s Stenosis Describe     Comments            +----------+--------+--------+--------+------------+------------------+  CCA Prox   82       15                             intimal thickening  +----------+--------+--------+--------+------------+------------------+  CCA Distal 71       16                             intimal thickening  +----------+--------+--------+--------+------------+------------------+  ICA Prox   58       22                heterogenous                     +----------+--------+--------+--------+------------+------------------+  ICA Distal 68       21                                                 +----------+--------+--------+--------+------------+------------------+  ECA        65       4                                                  +----------+--------+--------+--------+------------+------------------+ +----------+--------+-------+--------+-------------------+             PSV cm/s EDV cms Describe Arm Pressure (mmHG)  +----------+--------+-------+--------+-------------------+  Subclavian 44                                             +----------+--------+-------+--------+-------------------+  +---------+--------+--+--------+--+  Vertebral PSV cm/s 48 EDV cm/s 14  +---------+--------+--+--------+--+  Left Carotid Findings: +----------+--------+--------+--------+--------+------------------+             PSV cm/s EDV cm/s Stenosis Describe Comments            +----------+--------+--------+--------+--------+------------------+  CCA Prox   63       13                         intimal thickening  +----------+--------+--------+--------+--------+------------------+  CCA Distal 59  16                         intimal thickening  +----------+--------+--------+--------+--------+------------------+  ICA Prox   45       11                calcific                     +----------+--------+--------+--------+--------+------------------+  ECA        68       2                                              +----------+--------+--------+--------+--------+------------------+ +----------+--------+--------+--------+-------------------+  Subclavian PSV cm/s EDV cm/s Describe Arm Pressure (mmHG)  +----------+--------+--------+--------+-------------------+             82                                              +----------+--------+--------+--------+-------------------+ +---------+--------+--+--------+--+  Vertebral PSV cm/s 47 EDV cm/s 13  +---------+--------+--+--------+--+  Summary: Right Carotid: The extracranial vessels were near-normal with only minimal wall                thickening or plaque. Left Carotid: The extracranial vessels were near-normal with only minimal wall               thickening or plaque. Vertebrals:  Bilateral vertebral arteries demonstrate antegrade flow. Subclavians: Normal flow hemodynamics were seen in bilateral subclavian              arteries. *See table(s) above for measurements and observations.  Electronically signed by Waverly Ferrari MD on 04/27/2019 at 5:05:10 PM.    Final     Microbiology: Recent Results (from the past 240 hour(s))  SARS Coronavirus 2 (CEPHEID- Performed in [facility] hospital lab), Hosp Order     Status: None   Collection Time: 04/25/19  1:33 AM   Specimen: Nasopharyngeal Swab  Result Value Ref Range Status   SARS Coronavirus 2 NEGATIVE NEGATIVE Final    Comment: (NOTE) If result is NEGATIVE SARS-CoV-2 target nucleic acids are NOT DETECTED. The SARS-CoV-2 RNA is generally detectable in upper and lower  respiratory specimens during the acute phase of infection. The lowest  concentration of SARS-CoV-2 viral copies this assay can detect is 250  copies / mL. A negative result does not preclude SARS-CoV-2 infection  and should not be used as the sole basis for treatment or other  patient management decisions.  A negative result may occur with  improper specimen collection / handling, submission of specimen other  than nasopharyngeal swab, presence of viral mutation(s) within the  areas targeted by this assay, and inadequate number of viral copies  (<250 copies / mL). A negative result must be combined with clinical  observations, patient history, and epidemiological information. If result is POSITIVE SARS-CoV-2 target nucleic acids are DETECTED. The SARS-CoV-2 RNA is generally detectable in upper and lower  respiratory specimens dur ing the acute phase of infection.  Positive  results are indicative of active infection with SARS-CoV-2.  Clinical  correlation with patient history and other diagnostic information is  necessary to determine patient infection status.  Positive results do  not rule out bacterial infection or co-infection with other viruses. If result is PRESUMPTIVE POSTIVE SARS-CoV-2 nucleic acids MAY BE PRESENT.   A presumptive positive result was obtained on the submitted specimen  and confirmed on repeat testing.  While 2019 novel coronavirus  (SARS-CoV-2) nucleic acids may be present in the submitted sample  additional confirmatory testing may be necessary for epidemiological  and / or clinical management purposes  to  differentiate between  SARS-CoV-2 and other Sarbecovirus currently known to infect humans.  If clinically indicated additional testing with an alternate test  methodology [phone]) is advised. The SARS-CoV-2 RNA is generally  detectable in upper and lower respiratory sp ecimens during the acute  phase of infection. The expected result is Negative. Fact Sheet for Patients:  StrictlyIdeas.no Fact Sheet for Healthcare Providers: BankingDealers.co.za This test is not yet approved or cleared by the [place] FDA and has been authorized for detection and/or diagnosis of SARS-CoV-2 by FDA under an Emergency Use Authorization (EUA).  This EUA will remain in effect (meaning this test can be used) for the duration of the COVID-19 declaration under Section 564(b)(1) of the Act, 21 U.S.C. section 360bbb-3(b)(1), unless the authorization is terminated or revoked sooner. Performed at [facility], [place] [address] [place], [place] [address]   SARS Coronavirus 2 (CEPHEID - Performed in [place] hospital lab), Hosp Order     Status: None   Collection Time: 04/30/19  4:50 PM   Specimen: Nasopharyngeal Swab  Result Value Ref Range Status   SARS Coronavirus 2 NEGATIVE NEGATIVE Final    Comment: (NOTE) If result is NEGATIVE SARS-CoV-2 target nucleic acids are NOT DETECTED. The SARS-CoV-2 RNA is generally detectable in upper and lower  respiratory specimens during the acute phase of infection. The lowest  concentration of SARS-CoV-2 viral copies this assay can detect is 250  copies / mL. A negative result does not preclude SARS-CoV-2 infection  and should not be used as the sole basis for treatment or other  patient management decisions.  A negative result may occur with  improper specimen collection / handling, submission of specimen other  than nasopharyngeal swab, presence of viral mutation(s) within the  areas targeted by this  assay, and inadequate number of viral copies  (<250 copies / mL). A negative result must be combined with clinical  observations, patient history, and epidemiological information. If result is POSITIVE SARS-CoV-2 target nucleic acids are DETECTED. The SARS-CoV-2 RNA is generally detectable in upper and lower  respiratory specimens dur ing the acute phase of infection.  Positive  results are indicative of active infection with SARS-CoV-2.  Clinical  correlation with patient history and other diagnostic information is  necessary to determine patient infection status.  Positive results do  not rule out bacterial infection or co-infection with other viruses. If result is PRESUMPTIVE POSTIVE SARS-CoV-2 nucleic acids MAY BE PRESENT.   A presumptive positive result was obtained on the submitted specimen  and confirmed on repeat testing.  While 2019 novel coronavirus  (SARS-CoV-2) nucleic acids may be present in the submitted sample  additional confirmatory testing may be necessary for epidemiological  and / or clinical management purposes  to differentiate between  SARS-CoV-2 and other Sarbecovirus currently known to infect humans.  If clinically indicated additional testing with an alternate test  methodology [phone]) is advised. The SARS-CoV-2 RNA is generally  detectable in upper and lower respiratory sp ecimens during the acute  phase of infection. The expected result is Negative. Fact  Sheet for Patients:  BoilerBrush.com.cy Fact Sheet for Healthcare Providers: https://pope.com/ This test is not yet approved or cleared by the [place] FDA and has been authorized for detection and/or diagnosis of SARS-CoV-2 by FDA under an Emergency Use Authorization (EUA).  This EUA will remain in effect (meaning this test can be used) for the duration of the COVID-19 declaration under Section 564(b)(1) of the Act, 21 U.S.C. section 360bbb-3(b)(1),  unless the authorization is terminated or revoked sooner. Performed at [facility], [address], [place], [place] [address]      Labs: Basic Metabolic Panel: Recent Labs  Lab 04/24/19 2309 04/25/19 0826 04/26/19 0413  NA 143 142 141  K 4.1 3.6 4.0  CL 107 104 103  CO2 GLUCOSE 108* 112* 93  BUN CREATININE 0.73 0.70 0.52  CALCIUM 9.0 9.2 8.6*   Liver Function Tests: Recent Labs  Lab 04/25/19 0826  AST 16  ALT 13  ALKPHOS 61  BILITOT 0.8  PROT 5.7*  ALBUMIN 3.2*   CBC: Recent Labs  Lab 04/24/19 2309 04/25/19 0826 04/26/19 0413  WBC 10.3 9.8 11.7*  NEUTROABS 7.3  --   --   HGB 11.8* 13.3 12.7  HCT 38.1 42.2 39.2  MCV 99.0 97.7 95.8  PLT 153 148* 142*    CBG: Recent Labs  Lab 04/28/19 1619 04/28/19 2141 04/29/19 0629 04/29/19 1240 04/29/19 1637  GLUCAP 134* 147* 107* 138* 146*    Principal Problem:   Pneumothorax Active Problems:   Weakness generalized   Rheumatoid arthritis involving multiple joints (HCC)   DM2 (diabetes mellitus, type 2) (HCC)   Chronic adrenal insufficiency (HCC)   Pulmonary fibrosis (HCC)   COPD (chronic obstructive pulmonary disease) (HCC)   S/P TAVR (transcatheter aortic valve replacement)   Multiple closed fractures of ribs of right side   Fall at home, initial encounter   Time coordinating discharge: 40 minutes  Signed:  Brendia Sacks, MD  [facility]  05/01/2019, 10:30 AM

## 2019-04-28 NOTE — Progress Notes (Signed)
Room air SPO2 when Pt was sleeping was 84-85%. O2 NCL 2 LPM given, could maintain SPO2 93-98%. No acute distress noted. Pt slept well, pain tolerated well. Continue to monitor.  Kennyth Lose, RN

## 2019-04-28 NOTE — Progress Notes (Signed)
Patient ID: [name], female   DOB: 1935-03-19, 84 y.o.   MRN: [id number]       Subjective: Patient more comfortable today.  Working on her IS.  RN has been working with her a lot this morning.  Pulling 500 right now.  Eating some.  Voiding well with purewick  Objective: Vital signs in last 24 hours: Temp:  [97.6 F (36.4 C)-98.3 F (36.8 C)] 98.3 F (36.8 C) (07/08 0821) Pulse Rate:  [76-94] 84 (07/08 0852) Resp:  [12-23] 20 (07/08 0852) BP: (124-157)/(56-92) 133/92 (07/08 0821) SpO2:  [84 %-99 %] 95 % (07/08 0852) Last BM Date: 04/24/19  Intake/Output from previous day: 07/07 0701 - 07/08 0700 In: 430 [P.O.:430] Out: 650 [Urine:650] Intake/Output this shift: No intake/output data recorded.  PE: Gen: laying in bed in NAD Heart: regular Lungs: CTAB, moves air fairly well bilaterally.  Chest wall tenderness Abd: soft, NT, Nd, +BS  Lab Results:  Recent Labs    04/26/19 0413  WBC 11.7*  HGB 12.7  HCT 39.2  PLT 142*   BMET Recent Labs    04/26/19 0413  NA 141  K 4.0  CL 103  CO2 27  GLUCOSE 93  BUN 8  CREATININE 0.52  CALCIUM 8.6*   PT/INR No results for input(s): LABPROT, INR in the last 72 hours. CMP     Component Value Date/Time   NA 141 04/26/2019 0413   K 4.0 04/26/2019 0413   CL 103 04/26/2019 0413   CO2 27 04/26/2019 0413   GLUCOSE 93 04/26/2019 0413   BUN 8 04/26/2019 0413   CREATININE 0.52 04/26/2019 0413   CREATININE 0.64 03/23/2014 1429   CALCIUM 8.6 (L) 04/26/2019 0413   CALCIUM 8.6 11/15/2011 0445   PROT 5.7 (L) 04/25/2019 0826   ALBUMIN 3.2 (L) 04/25/2019 0826   AST 16 04/25/2019 0826   ALT 13 04/25/2019 0826   ALKPHOS 61 04/25/2019 0826   BILITOT 0.8 04/25/2019 0826   GFRNONAA >60 04/26/2019 0413   GFRAA >60 04/26/2019 0413   Lipase     Component Value Date/Time   LIPASE 25 10/01/2011 1140       Studies/Results: No results found.  Anti-infectives: Anti-infectives (From admission, onward)   None        Assessment/Plan S/p fall Right rib fx x2- pulm toilet, pain control, IS, decrease in O2 needs back to 2L today.  Looks good today. Small right pneumothorax- stable, still very small. RA- per medicine Diabetes mellitus 2- per medicine COPD- pt states she is not on home o2, down to 2L today Chronic adrenal insufficiency- per medicine FEN- carb mod diet VTE- heparin ID- none  Cont current care per medicine.  No further acute trauma intervention at this time.  Plan appears to be DC to SNF when medically stable.  We will sign off.  Please call if needed.    LOS: 3 days    Henreitta Cea , [facility] 04/28/2019, 9:07 AM Pager: [phone]

## 2019-04-28 NOTE — Progress Notes (Signed)
Physical Therapy Treatment Patient Details Name: [name] MRN: [id number] DOB: [birth year] Today's Date: 04/28/2019    History of Present Illness 84 yo admitted after fall with Rt 7-8 rib fx with small PTX. PMhx: aortic stenosis, COPD on chronic oxygen, rheumatoid arthritis, pulmonary fibrosis.,  Gait instability, anxiety disorder, adrenal insufficiency    PT Comments    Pt ready to get up without prodding.  Emphasis on gait stability/stamina.  Pt's sats maintained in mid 90's on RA, though reapplied once in the chair.   Follow Up Recommendations  SNF;Supervision/Assistance - 24 hour;Home health PT     Equipment Recommendations  None recommended by PT    Recommendations for Other Services       Precautions / Restrictions Precautions Precautions: Fall    Mobility  Bed Mobility             Sit to sidelying: Min assist General bed mobility comments: minor truncal assist  Transfers Overall transfer level: Needs assistance Equipment used: 1 person hand held assist Transfers: Sit to/from Stand Sit to Stand: Min assist         General transfer comment: boost and assist to come forward.  Ambulation/Gait Ambulation/Gait assistance: Min assist Gait Distance (Feet): 150 Feet Assistive device: Rolling walker (2 wheeled) Gait Pattern/deviations: Step-through pattern;Decreased stride length Gait velocity: slower   General Gait Details: cues for postural checks, staying closer to the RW.   Stairs             Wheelchair Mobility    Modified Rankin (Stroke Patients Only)       Balance Overall balance assessment: History of Falls;Needs assistance Sitting-balance support: Feet supported Sitting balance-Leahy Scale: Fair       Standing balance-Leahy Scale: Poor                              Cognition Arousal/Alertness: Awake/alert Behavior During Therapy: WFL for tasks assessed/performed Overall Cognitive Status: (NT formally)                                         Exercises      General Comments        Pertinent Vitals/Pain Faces Pain Scale: Hurts little more Pain Location: R flank Pain Descriptors / Indicators: Discomfort;Grimacing Pain Intervention(s): Monitored during session    Home Living                      Prior Function            PT Goals (current goals can now be found in the care plan section) Acute Rehab PT Goals Patient Stated Goal: return home with help of family PT Goal Formulation: With patient Time For Goal Achievement: 05/10/19 Potential to Achieve Goals: Fair Progress towards PT goals: Progressing toward goals    Frequency    Min 3X/week      PT Plan Current plan remains appropriate    Co-evaluation              AM-PAC PT "6 Clicks" Mobility   Outcome Measure  Help needed turning from your back to your side while in a flat bed without using bedrails?: A Little Help needed moving from lying on your back to sitting on the side of a flat bed without using bedrails?: A Little Help needed moving to and  from a bed to a chair (including a wheelchair)?: A Little Help needed standing up from a chair using your arms (e.g., wheelchair or bedside chair)?: A Little Help needed to walk in hospital room?: A Little Help needed climbing 3-5 steps with a railing? : A Lot 6 Click Score: 17    End of Session   Activity Tolerance: Patient tolerated treatment well;Patient limited by pain Patient left: in chair;with call bell/phone within reach;with chair alarm set Nurse Communication: Mobility status PT Visit Diagnosis: Other abnormalities of gait and mobility (R26.89);Muscle weakness (generalized) (M62.81);Unsteadiness on feet (R26.81);History of falling (Z91.81)     Time: 4580-9983 PT Time Calculation (min) (ACUTE ONLY): 18 min  Charges:  $Gait Training: 8-22 mins                     04/28/2019  Donnella Sham, PT Acute Rehabilitation  Services [phone]  (pager) [phone]  (office)   [name] 04/28/2019, 5:33 PM

## 2019-04-28 NOTE — Progress Notes (Addendum)
CSW spoke with pt's daughter.  Both SNF's have declined bed offers.  CSW awaiting call back from [facility]. Pt does not have any bed offers currently.  Will continue to follow up with offers.  Reed Breech LCSWA [phone]

## 2019-04-28 NOTE — Care Management Important Message (Signed)
Important Message  Patient Details  Name: [name] MRN: [id number] Date of Birth: 10-Jun-1935   Medicare Important Message Given:  Yes     Shelda Altes 04/28/2019, 11:53 AM

## 2019-04-29 LAB — GLUCOSE, CAPILLARY
Glucose-Capillary: 107 mg/dL — ABNORMAL HIGH (ref 70–99)
Glucose-Capillary: 138 mg/dL — ABNORMAL HIGH (ref 70–99)
Glucose-Capillary: 146 mg/dL — ABNORMAL HIGH (ref 70–99)

## 2019-04-29 NOTE — Progress Notes (Signed)
PROGRESS NOTE  [name] WPY:[id number] DOB: 1934/11/05 DOA: 04/24/2019 PCP: Jarome Matin, MD  Brief History   84 year old woman lives at home PMH aortic stenosis, COPD, chronic hypoxic respiratory failure on oxygen, rheumatoid arthritis, pulmonary fibrosis, gait instability with history of previous falls; presented via EMS 7/5 after a fall at home described as mechanical, did not strike head, no loss of consciousness reported. Found to have right-sided rib fractures seventh and eighth ribs as well as 10% right pneumothorax of the apex. Increased oxygen requirement. Transferred to [facility] for trauma surgery evaluation.  Subsequently stabilized and now awaiting skilled nursing facility bed.  A & P  Mechanical fall at home resulting in traumatic right-sided rib fractures and 10% apical pneumothorax on the right. --Continue conservative management.  Stable.  COPD, acute hypoxic respiratory failure secondary to rib fractures, history of pulmonary fibrosis..  Patient reports previously being on nasal cannula oxygen in the context of pneumonia. --Stable on 2 L nasal cannula.  Diabetes mellitus type 2, hemoglobin A1c last check 5.0 --Blood sugars stable.  Diet controlled.  No need to continue checking CBGs.  Aortic stenosis status post TAVR 2014  --Perivalvular leak noted but per cardiology this was evident on previous echocardiogram and no change in therapy was recommended.  Rheumatoid arthritis --Stable.  Continue prednisone.  PMH gait instability with history of multiple falls --Xanax, Remeron, nortriptyline listed as an outpatient medication, consider weaning this as an outpatient if able --Orthostatic hypotension noted when standing. Recommend slow positional changes especially when standing. Maintain hydration. Will not initiate ACE inhibitor at this time  Chronic adrenal insufficiency --Continue oral replacement  New diagnosis of left ventricular systolic  dysfunction LVEF 35-40% with elevated troponins. Troponins mildly elevated. No signs or symptoms of acute CHF or ACS. --Started on daily aspirin. Continue beta-blocker, statin.  No indication for Lasix at this point. High sensitive troponins: 50, 134, 131 --Consider ACE inhibitor as an outpatient but would follow blood pressure closely and rule out intermittent orthostatic hypotension prior to starting this.  Chronic lower extremity edema left greater than right for which she has wrapped her legs for several months, thought to be secondary to venous insufficiency.  --Appears minimal.  Previously on Lasix but this was discontinued as an outpatient.  Modest thrombocytopenia, seen 2014, 2018, 2019, appears to be intermittent in nature. Follow-up as an outpatient if clinically indicated.   Remains stable for transfer to skilled nursing facility  Stop telemetry.  Change to MedSurg status.   DVT prophylaxis: heparin Code Status: Full Family Communication: none Disposition Plan: SNF    Brendia Sacks, MD  [facility] Direct contact: see [email or web address] (further directions at bottom of note if needed) 7PM-7AM contact night coverage as at bottom of note 04/29/2019, 11:24 AM  LOS: 4 days    Interval History/Subjective  Feels well, minimal pain.  Breathing okay.  Up with physical therapy today.  Objective   Vitals:  Vitals:   04/29/19 0753 04/29/19 0856  BP: 116/64   Pulse: 79   Resp: 18 (!) 23  Temp: 97.9 F (36.6 C)   SpO2: 95%     Exam:  Constitutional:  . Appears calm and comfortable Respiratory:  . CTA bilaterally, no w/r/r.  . Respiratory effort normal Cardiovascular:  . RRR, no m/r/g . No significant LE extremity edema.  Bilateral lower extremity is wrapped. Psychiatric:  . Mental status o Mood, affect appropriate  I have personally reviewed the following:   Today's Data  . CBG stable  Scheduled Meds: . acetaminophen  500 mg Oral TID  .  ALPRAZolam  0.5 mg Oral BID  . aspirin  81 mg Oral Daily  . atorvastatin  20 mg Oral q1800  . bisacodyl  10 mg Oral Daily  . bisoprolol  5 mg Oral Daily  . buPROPion  100 mg Oral q morning - 10a  . cholecalciferol  1,000 Units Oral Daily  . fentaNYL (SUBLIMAZE) injection  50 mcg Intravenous Once  . furosemide  20 mg Oral Daily  . heparin  5,000 Units Subcutaneous Q8H  . latanoprost  1 drop Both Eyes QHS  . leflunomide  20 mg Oral Daily  . lidocaine  2 patch Transdermal Q24H  . mirtazapine  15 mg Oral QHS  . nortriptyline  25 mg Oral QHS  . oxyCODONE  2.5-5 mg Oral BID  . polyethylene glycol  17 g Oral Daily  . predniSONE  5 mg Oral BID WC  . vitamin B-12  1,000 mcg Oral Daily   Continuous Infusions:  Principal Problem:   Pneumothorax Active Problems:   Weakness generalized   Rheumatoid arthritis involving multiple joints (HCC)   DM2 (diabetes mellitus, type 2) (HCC)   Chronic adrenal insufficiency (HCC)   Pulmonary fibrosis (HCC)   COPD (chronic obstructive pulmonary disease) (HCC)   S/P TAVR (transcatheter aortic valve replacement)   Multiple closed fractures of ribs of right side   Fall at home, initial encounter   LOS: 4 days   How to contact the [facility] Attending or Consulting provider [place] or covering provider during after hours [place], for this patient?  1. Check the care team in [facility] and look for a) attending/consulting [facility] provider listed and b) the [facility] team listed 2. Log into [email or web address] and use [place]'s universal password to access. If you do not have the password, please contact the hospital operator. 3. Locate the [facility] provider you are looking for under [facility] and page to a number that you can be directly reached. 4. If you still have difficulty reaching the provider, please page the [facility] (Director on Call) for the Hospitalists listed on amion for assistance.

## 2019-04-29 NOTE — Progress Notes (Signed)
Physical Therapy Treatment Patient Details Name: [name] MRN: [id number] DOB: 23-Jan-1935 Today's Date: 04/29/2019    History of Present Illness 84 yo admitted after fall with Rt 7-8 rib fx with small PTX. PMhx: aortic stenosis, COPD on chronic oxygen, rheumatoid arthritis, pulmonary fibrosis.,  Gait instability, anxiety disorder, adrenal insufficiency    PT Comments    Patient seen for mobility progression. Agreeable to OOB mobility. Required use of RW for mobility with Min A for balance and steadying with gait. Flexed posture throughout mobility with cueing for increased extension. Will continue to follow.     Follow Up Recommendations  SNF;Supervision/Assistance - 24 hour;Home health PT     Equipment Recommendations  None recommended by PT    Recommendations for Other Services       Precautions / Restrictions Precautions Precautions: Fall Restrictions Weight Bearing Restrictions: No    Mobility  Bed Mobility               General bed mobility comments: sitting EOB upon arrival  Transfers Overall transfer level: Needs assistance Equipment used: 1 person hand held assist;Rolling walker (2 wheeled) Transfers: Sit to/from [employer] Sit to Stand: Min assist Stand pivot transfers: Min assist       General transfer comment: Min A to stand from bedside and recliner; Min A for initial steadying  Ambulation/Gait Ambulation/Gait assistance: Min assist;Min guard Gait Distance (Feet): 120 Feet Assistive device: Rolling walker (2 wheeled) Gait Pattern/deviations: Step-through pattern;Decreased stride length;Trunk flexed Gait velocity: decreased   General Gait Details: slow steady pace of gait; cueing for safety and posturing   Stairs             Wheelchair Mobility    Modified Rankin (Stroke Patients Only)       Balance Overall balance assessment: History of Falls;Needs assistance Sitting-balance support: No upper extremity  supported;Feet supported Sitting balance-Leahy Scale: Fair     Standing balance support: Bilateral upper extremity supported;During functional activity Standing balance-Leahy Scale: Poor Standing balance comment: reliant on RW - noted flexed knees in standing                            Cognition Arousal/Alertness: Awake/alert Behavior During Therapy: WFL for tasks assessed/performed Overall Cognitive Status: Within Functional Limits for tasks assessed                                        Exercises      General Comments        Pertinent Vitals/Pain Pain Assessment: Faces Faces Pain Scale: Hurts a little bit Pain Location: ribs Pain Descriptors / Indicators: Discomfort;Grimacing Pain Intervention(s): Limited activity within patient's tolerance;Monitored during session;Repositioned    Home Living                      Prior Function            PT Goals (current goals can now be found in the care plan section) Acute Rehab PT Goals Patient Stated Goal: return home with help of family PT Goal Formulation: With patient Time For Goal Achievement: 05/10/19 Potential to Achieve Goals: Fair Progress towards PT goals: Progressing toward goals    Frequency    Min 3X/week      PT Plan Current plan remains appropriate    Co-evaluation  AM-PAC PT "6 Clicks" Mobility   Outcome Measure  Help needed turning from your back to your side while in a flat bed without using bedrails?: A Little Help needed moving from lying on your back to sitting on the side of a flat bed without using bedrails?: A Little Help needed moving to and from a bed to a chair (including a wheelchair)?: A Little Help needed standing up from a chair using your arms (e.g., wheelchair or bedside chair)?: A Little Help needed to walk in hospital room?: A Little Help needed climbing 3-5 steps with a railing? : A Lot 6 Click Score: 17    End of  Session Equipment Utilized During Treatment: Gait belt;Oxygen Activity Tolerance: Patient tolerated treatment well Patient left: in chair;with call bell/phone within reach Nurse Communication: Mobility status PT Visit Diagnosis: Other abnormalities of gait and mobility (R26.89);Muscle weakness (generalized) (M62.81);Unsteadiness on feet (R26.81);History of falling (Z91.81)     Time: 0037-0488 PT Time Calculation (min) (ACUTE ONLY): 20 min  Charges:  $Gait Training: 8-22 mins                     Lanney Gins, PT, DPT Supplemental Physical Therapist 04/29/19 9:40 AM Pager: [phone] Office: [phone]

## 2019-04-30 LAB — SARS CORONAVIRUS 2 BY RT PCR (HOSPITAL ORDER, PERFORMED IN ~~LOC~~ HOSPITAL LAB): SARS Coronavirus 2: NEGATIVE

## 2019-04-30 NOTE — Progress Notes (Signed)
PROGRESS NOTE  [name] WER:[id number] DOB: [birth year] DOA: 04/24/2019 PCP: Jarome Matin, MD  Brief History   84 year old woman lives at home PMH aortic stenosis, COPD, chronic hypoxic respiratory failure on oxygen, rheumatoid arthritis, pulmonary fibrosis, gait instability with history of previous falls; presented via EMS 7/5 after a fall at home described as mechanical, did not strike head, no loss of consciousness reported. Found to have right-sided rib fractures seventh and eighth ribs as well as 10% right pneumothorax of the apex. Increased oxygen requirement. Transferred to [facility] for trauma surgery evaluation.  Subsequently stabilized and now awaiting skilled nursing facility bed.  A & P  Mechanical fall at home resulting in traumatic right-sided rib fractures and 10% apical pneumothorax on the right. --Stable.  Continue conservative management.    COPD, acute hypoxic respiratory failure secondary to rib fractures, history of pulmonary fibrosis..  Patient reports previously being on nasal cannula oxygen in the context of pneumonia. --Stable on 1 L.  Diabetes mellitus type 2, hemoglobin A1c last check 5.0 --Diet controlled.  No need for outpatient monitoring.  Aortic stenosis status post TAVR 2014  --Perivalvular leak noted but per cardiology this was evident on previous echocardiogram and no change in therapy was recommended.  Rheumatoid arthritis --Stable.  Continue prednisone.  PMH gait instability with history of multiple falls --Xanax, Remeron, nortriptyline listed as an outpatient medication, consider weaning this as an outpatient if able --Orthostatic hypotension noted when standing. Recommend slow positional changes especially when standing. Maintain hydration. Will not initiate ACE inhibitor at this time  Chronic adrenal insufficiency --Continue oral replacement  New diagnosis of left ventricular systolic dysfunction LVEF 35-40% with elevated  troponins. Troponins mildly elevated. No signs or symptoms of acute CHF or ACS. --Continue daily aspirin.  Continue beta-blocker, statin.    No need for Lasix at this point. High sensitive troponins: 50, 134, 131 --Consider ACE inhibitor as an outpatient but would follow blood pressure closely and rule out intermittent orthostatic hypotension prior to starting this.  Chronic lower extremity edema left greater than right for which she has wrapped her legs for several months, thought to be secondary to venous insufficiency.  --No significant lower extremity edema.  Previously on Lasix but this was discontinued as an outpatient.  Modest thrombocytopenia, seen 2014, 2018, 2019, appears to be intermittent in nature. Follow-up as an outpatient if clinically indicated.   Remains stable for transfer to skilled nursing facility   DVT prophylaxis: heparin Code Status: Full Family Communication: none Disposition Plan: SNF    Brendia Sacks, MD  [facility] Direct contact: see [email or web address] (further directions at bottom of note if needed) 7PM-7AM contact night coverage as at bottom of note 04/30/2019, 11:42 AM  LOS: 5 days    Interval History/Subjective  Large bowel movement overnight.  Overall feeling okay.  Objective   Vitals:  Vitals:   04/30/19 0517 04/30/19 0942  BP: 119/64 (!) 121/58  Pulse: 82 90  Resp: 13   Temp: 97.7 F (36.5 C) 97.9 F (36.6 C)  SpO2: 94% 92%    Exam:  Constitutional:   . Appears calm and comfortable Respiratory:  . CTA bilaterally, no w/r/r.  . Respiratory effort normal.  Cardiovascular:  . RRR, no m/r/g . No LE extremity edema   Psychiatric:  . Mental status o Mood, affect appropriate   I have personally reviewed the following:   Today's Data  . CBG stable  Scheduled Meds: . acetaminophen  500 mg Oral TID  . ALPRAZolam  0.5 mg Oral BID  . aspirin  81 mg Oral Daily  . atorvastatin  20 mg Oral q1800  . bisacodyl  10 mg Oral  Daily  . bisoprolol  5 mg Oral Daily  . buPROPion  100 mg Oral q morning - 10a  . cholecalciferol  1,000 Units Oral Daily  . furosemide  20 mg Oral Daily  . heparin  5,000 Units Subcutaneous Q8H  . latanoprost  1 drop Both Eyes QHS  . leflunomide  20 mg Oral Daily  . lidocaine  2 patch Transdermal Q24H  . mirtazapine  15 mg Oral QHS  . nortriptyline  25 mg Oral QHS  . oxyCODONE  2.5-5 mg Oral BID  . polyethylene glycol  17 g Oral Daily  . predniSONE  5 mg Oral BID WC  . vitamin B-12  1,000 mcg Oral Daily   Continuous Infusions:  Principal Problem:   Pneumothorax Active Problems:   Weakness generalized   Rheumatoid arthritis involving multiple joints (HCC)   DM2 (diabetes mellitus, type 2) (HCC)   Chronic adrenal insufficiency (HCC)   Pulmonary fibrosis (HCC)   COPD (chronic obstructive pulmonary disease) (HCC)   S/P TAVR (transcatheter aortic valve replacement)   Multiple closed fractures of ribs of right side   Fall at home, initial encounter   LOS: 5 days   How to contact the [facility] Attending or Consulting provider [place] or covering provider during after hours [place], for this patient?  1. Check the care team in [facility] and look for a) attending/consulting TRH provider listed and b) the [facility] team listed 2. Log into [email or web address] and use [place]'s universal password to access. If you do not have the password, please contact the hospital operator. 3. Locate the [facility] provider you are looking for under [facility] and page to a number that you can be directly reached. 4. If you still have difficulty reaching the provider, please page the [facility] (Director on Call) for the Hospitalists listed on amion for assistance.

## 2019-04-30 NOTE — Progress Notes (Signed)
Order received for repeat COVID-19 swab this afternoon.  Collected and delivered to lab via nursing staff.

## 2019-04-30 NOTE — Progress Notes (Signed)
CSW received a phone call from [place], Engineer, site at [facility]. They have insurance authorization. They will be able to take her on Saturday. Chantell asked if the CSW could fax the DC summary and the negative COVID result to [phone].   CSW will continue to follow and assist with discharge.   Domenic Schwab, MSW, [place]

## 2019-04-30 NOTE — TOC Progression Note (Addendum)
Transition of Care [facility]) - Progression Note    Patient Details  Name: SHARINA PETRE MRN: [id number] Date of Birth: [birth year]  Transition of Care [facility]) CM/SW [place], [place] Phone Number: 04/30/2019, 11:53 AM  Clinical Narrative:     CSW called and provided the patient's daughter with bed offers. The patient's daughter chose [facility]. CSW stated that she would call and start insurance authorization.   CSW called and spoke with Tammy at [facility]. They do not have any beds available today and would not have any until late next week. CSW stated to keep her updated if that was to change.   CSW called and spoke with the patient's daughter again and explained. CSW stated that another facility had extended a bed offer. The patient's daughter chose [place]. CSW stated that they would start insurance authorization.   CSW followed up with Chantell at [facility], they are starting insurance authorization.    CSW will continue to follow.    Expected Discharge Plan: [place] Barriers to Discharge: Continued Medical Work up  Expected Discharge Plan and Services Expected Discharge Plan: [place] In-house Referral: Clinical Social Work   Post Acute Care Choice: [place] Living arrangements for the past 2 months: Single Family Home Expected Discharge Date: 04/28/19                                     Social Determinants of Health (SDOH) Interventions    Readmission Risk Interventions No flowsheet data found.

## 2019-04-30 NOTE — Progress Notes (Signed)
Occupational Therapy Treatment Patient Details Name: [name] MRN: [id number] DOB: 22-Jan-1935 Today's Date: 04/30/2019    History of present illness 84 yo admitted after fall with Rt 7-8 rib fx with small PTX. PMhx: aortic stenosis, COPD on chronic oxygen, rheumatoid arthritis, pulmonary fibrosis.,  Gait instability, anxiety disorder, adrenal insufficiency   OT comments  Pt progressing towards acute OT goals. Walked to bathroom and completed toilet transfers min A. Mod A for pericare. Pt reporting "I need to lie down" upon standing from toilet. Pt cued to sit back down, recliner brought to pt and she was able to take pivotal steps after a couple minute seated rest break. Bp assessed 103/44, suspect orthostatic. Pt reporting mild nausea at end of session, nursing notified. D/c plan remains appropriate.    Follow Up Recommendations  SNF;Supervision/Assistance - 24 hour    Equipment Recommendations  None recommended by OT    Recommendations for Other Services      Precautions / Restrictions Precautions Precautions: Fall Restrictions Weight Bearing Restrictions: No       Mobility Bed Mobility Overal bed mobility: Needs Assistance Bed Mobility: Rolling;Sidelying to Sit Rolling: Min assist Sidelying to sit: Min assist       General bed mobility comments: minor truncal assist. Increased R flank pain.  Transfers Overall transfer level: Needs assistance Equipment used: Rolling walker (2 wheeled) Transfers: Sit to/from [employer] Sit to Stand: Min assist Stand pivot transfers: Min assist       General transfer comment: steadying assist. from EOB, to/from 3n1 over toilet, to recliner.     Balance Overall balance assessment: History of Falls;Needs assistance Sitting-balance support: No upper extremity supported;Feet supported Sitting balance-Leahy Scale: Fair     Standing balance support: Bilateral upper extremity supported;During functional  activity Standing balance-Leahy Scale: Poor Standing balance comment: reliant on RW - noted flexed knees, hip IR in standing                           ADL either performed or assessed with clinical judgement   ADL Overall ADL's : Needs assistance/impaired                         Toilet Transfer: Minimal assistance;Ambulation;RW Toilet Transfer Details (indicate cue type and reason): 3n1 over toilet Toileting- Clothing Manipulation and Hygiene: Moderate assistance;Maximal assistance;Sit to/from stand Toileting - Clothing Manipulation Details (indicate cue type and reason): assist for pericare with pt supporting self with rw     Functional mobility during ADLs: Minimal assistance;Rolling walker General ADL Comments: Pt completed in room functional mobility, toilet transfer, pericare. Limited by onset of lightheadedness standing from toilet. Brought recliner to pt in lieu of walking across room. "I need to lie down."     Vision       Perception     Praxis      Cognition Arousal/Alertness: Awake/alert Behavior During Therapy: WFL for tasks assessed/performed Overall Cognitive Status: Within Functional Limits for tasks assessed                                 General Comments: some verbal perseveration noted. tangential at times.        Exercises     Shoulder Instructions       General Comments      Pertinent Vitals/ Pain       Pain Assessment:  Faces Faces Pain Scale: Hurts little more Pain Location: R flank Pain Descriptors / Indicators: Discomfort;Grimacing;Guarding Pain Intervention(s): Limited activity within patient's tolerance;Monitored during session;Repositioned  Home Living                                          Prior Functioning/Environment              Frequency  Min 2X/week        Progress Toward Goals  OT Goals(current goals can now be found in the care plan section)  Progress  towards OT goals: Progressing toward goals  Acute Rehab OT Goals Patient Stated Goal: return home with help of family OT Goal Formulation: With patient Time For Goal Achievement: 05/10/19 Potential to Achieve Goals: Good ADL Goals Pt Will Perform Grooming: with supervision;sitting;standing Pt Will Perform Upper Body Bathing: with set-up;with supervision;sitting Pt Will Perform Upper Body Dressing: with set-up;sitting Pt Will Perform Lower Body Dressing: with min guard assist;sit to/from stand Pt Will Transfer to Toilet: with supervision;ambulating Pt Will Perform Toileting - Clothing Manipulation and hygiene: with supervision;sit to/from stand  Plan Discharge plan remains appropriate    Co-evaluation                 AM-PAC OT "6 Clicks" Daily Activity     Outcome Measure   Help from another person eating meals?: None Help from another person taking care of personal grooming?: A Little Help from another person toileting, which includes using toliet, bedpan, or urinal?: A Lot Help from another person bathing (including washing, rinsing, drying)?: A Lot Help from another person to put on and taking off regular upper body clothing?: A Little Help from another person to put on and taking off regular lower body clothing?: A Lot 6 Click Score: 16    End of Session Equipment Utilized During Treatment: Oxygen(1L)  OT Visit Diagnosis: Muscle weakness (generalized) (M62.81);History of falling (Z91.81);Unsteadiness on feet (R26.81)   Activity Tolerance Other (comment)(dizzy standing from 3n1 over toilet "I need to lie down.")   Patient Left in chair;with call bell/phone within reach   Nurse Communication Other (comment)(dizzy/woozy standing from toilet, bp 103/44)        Time: 4196-2229 OT Time Calculation (min): 29 min  Charges: OT General Charges $OT Visit: 1 Visit OT Treatments $Self Care/Home Management : 23-37 mins  Tyrone Schimke, OT Acute Rehabilitation  Services Pager: [phone] Office: [phone]    Hortencia Pilar 04/30/2019, 1:11 PM

## 2019-05-01 MED ORDER — OXYCODONE HCL 5 MG PO TABS: 2.5000 mg | ORAL_TABLET | Freq: Two times a day (BID) | ORAL | 0 refills | Status: DC | PRN

## 2019-05-01 MED ORDER — OXYCODONE HCL 5 MG PO TABS: 2.5000 mg | ORAL_TABLET | Freq: Two times a day (BID) | ORAL | 0 refills | Status: AC | PRN

## 2019-05-01 NOTE — Plan of Care (Signed)
Patient discharging to SNF and will continue to be monitored.

## 2019-05-01 NOTE — Progress Notes (Signed)
Orthopedic Tech Progress Note Patient Details:  [name] 07/14/1935 [id number]  Patient ID: [name], female   DOB: August 21, 1935, 84 y.o.   MRN: [id number]   [name] 05/01/2019, 12:30 PMOrtho department don't stock dry dressing unna wraps.

## 2019-05-01 NOTE — Progress Notes (Signed)
Report called to nurse at [facility] [phone]. Patient alert, resting in bed. Awaiting transport from [place]. No s/s of acute distress noted.

## 2019-05-01 NOTE — TOC Transition Note (Signed)
Transition of Care [facility]) - CM/SW Discharge Note   Patient Details  Name: [name] MRN: [id number] Date of Birth: 1935/07/02  Transition of Care [facility]) CM/SW Contact:  Bary Castilla, LCSW Phone Number: [phone] 05/01/2019, 3:49 PM   Clinical Narrative:   Patient will DC to: [name] Anticipated DC date:?05/01/19 Family notified:?[name] by: [name]   Per MD patient ready for DC to [name]. RN, patient, patient's family, and facility notified of DC. Discharge Summary sent to facility. RN given number for report [phone] .DC packet on chart. Ambulance transport requested for patient.  CSW signing off.   [name], [place] [phone]          Final next level of care: Skilled Nursing Facility Barriers to Discharge: No Barriers Identified   Patient Goals and CMS Choice Patient states their goals for this hospitalization and ongoing recovery are:: Pt is agreeable to SNF, daughter will assist with placement decision CMS Medicare.gov Compare Post Acute Care list provided to:: Patient Represenative (must comment) Choice offered to / list presented to : Adult Children  Discharge Placement              Patient chooses bed at: [name] Patient to be transferred to facility by: [name] Name of family member notified: Daughter [name] Patient and family notified of of transfer: 05/01/19  Discharge Plan and Services In-house Referral: Clinical Social Work   Post Acute Care Choice: [name]                               Social Determinants of Health (SDOH) Interventions     Readmission Risk Interventions No flowsheet data found.

## 2019-05-01 NOTE — Progress Notes (Addendum)
PROGRESS NOTE  [name] JJO:[id number] DOB: [birth year] DOA: 04/24/2019 PCP: Jarome Matin, MD  Brief History   84 year old woman lives at home PMH aortic stenosis, COPD, chronic hypoxic respiratory failure on oxygen, rheumatoid arthritis, pulmonary fibrosis, gait instability with history of previous falls; presented via EMS 7/5 after a fall at home described as mechanical, did not strike head, no loss of consciousness reported. Found to have right-sided rib fractures seventh and eighth ribs as well as 10% right pneumothorax of the apex. Increased oxygen requirement. Transferred to [facility] for trauma surgery evaluation.  Subsequently stabilized and now awaiting skilled nursing facility bed.  A & P  Mechanical fall at home resulting in traumatic right-sided rib fractures and 10% apical pneumothorax on the right. --SPO2 stable on 1 L nasal cannula.  Stable for discharge.  COPD, acute hypoxic respiratory failure secondary to rib fractures, history of pulmonary fibrosis..  Patient reports previously being on nasal cannula oxygen in the context of pneumonia. --Remained stable on 1 L.  Diabetes mellitus type 2, hemoglobin A1c last check 5.0 --Diet controlled.  No need for outpatient monitoring.  Aortic stenosis status post TAVR 2014  --Perivalvular leak noted but per cardiology this was evident on previous echocardiogram and no change in therapy was recommended.  Rheumatoid arthritis --Stable.  Continue prednisone.  PMH gait instability with history of multiple falls --Xanax, Remeron, nortriptyline listed as an outpatient medication, consider weaning this as an outpatient if able --Orthostatic hypotension noted when standing. Recommend slow positional changes especially when standing. Maintain hydration. Will not initiate ACE inhibitor at this time  Chronic adrenal insufficiency --Continue oral replacement  New diagnosis of left ventricular systolic dysfunction LVEF  35-40% with elevated troponins. Troponins mildly elevated. No signs or symptoms of acute CHF or ACS. --Continue daily aspirin.  Continue beta-blocker, statin.    No need for Lasix at this point. High sensitive troponins: 50, 134, 131 --Consider ACE inhibitor as an outpatient but would follow blood pressure closely and rule out intermittent orthostatic hypotension prior to starting this.  Chronic lower extremity edema left greater than right for which she has wrapped her legs for several months, thought to be secondary to venous insufficiency.  --No significant lower extremity edema.  Previously on Lasix but this was discontinued as an outpatient.  Modest thrombocytopenia, seen 2014, 2018, 2019, appears to be intermittent in nature. Follow-up as an outpatient if clinically indicated.  Stable for transfer to skilled nursing facility.  DVT prophylaxis: heparin Code Status: Full Family Communication: pt asked me to speak to daughter Olegario Messier; I updated her by telephone; questions answered Disposition Plan: SNF    Brendia Sacks, MD  [facility] Direct contact: see [email or web address] (further directions at bottom of note if needed) 7PM-7AM contact night coverage as at bottom of note 05/01/2019, 10:16 AM  LOS: 6 days    Interval History/Subjective  Had a difficult night but feeling okay today.  Significant right rib pain.  Breathing okay.  Objective   Vitals:  Vitals:   05/01/19 0944 05/01/19 0946  BP:  (!) 108/44  Pulse:  60  Resp:  16  Temp: (!) 97.5 F (36.4 C) (!) 97.5 F (36.4 C)  SpO2:      Exam:  Constitutional:   . Appears anxious but stable Respiratory:  . CTA bilaterally, no w/r/r.  . Respiratory effort normal. Cardiovascular:  . RRR, no m/r/g . No LE extremity edema   Psychiatric:  . Mental status o Mood sad, affect anxious . judgment and  insight appear intact    I have personally reviewed the following:   Today's Data  . SARS-CoV-2 stable   Scheduled Meds: . acetaminophen  500 mg Oral TID  . ALPRAZolam  0.5 mg Oral BID  . aspirin  81 mg Oral Daily  . atorvastatin  20 mg Oral q1800  . bisacodyl  10 mg Oral Daily  . bisoprolol  5 mg Oral Daily  . buPROPion  100 mg Oral q morning - 10a  . cholecalciferol  1,000 Units Oral Daily  . furosemide  20 mg Oral Daily  . heparin  5,000 Units Subcutaneous Q8H  . latanoprost  1 drop Both Eyes QHS  . leflunomide  20 mg Oral Daily  . lidocaine  2 patch Transdermal Q24H  . mirtazapine  15 mg Oral QHS  . nortriptyline  25 mg Oral QHS  . oxyCODONE  2.5-5 mg Oral BID  . polyethylene glycol  17 g Oral Daily  . predniSONE  5 mg Oral BID WC  . vitamin B-12  1,000 mcg Oral Daily   Continuous Infusions:  Principal Problem:   Pneumothorax Active Problems:   Weakness generalized   Rheumatoid arthritis involving multiple joints (HCC)   DM2 (diabetes mellitus, type 2) (HCC)   Chronic adrenal insufficiency (HCC)   Pulmonary fibrosis (HCC)   COPD (chronic obstructive pulmonary disease) (HCC)   S/P TAVR (transcatheter aortic valve replacement)   Multiple closed fractures of ribs of right side   Fall at home, initial encounter   LOS: 6 days   How to contact the [facility] Attending or Consulting provider [place] or covering provider during after hours [place], for this patient?  1. Check the care team in [facility] and look for a) attending/consulting TRH provider listed and b) the [facility] team listed 2. Log into [email or web address] and use 's universal password to access. If you do not have the password, please contact the hospital operator. 3. Locate the [facility] provider you are looking for under [facility] and page to a number that you can be directly reached. 4. If you still have difficulty reaching the provider, please page the [facility] (Director on Call) for the Hospitalists listed on amion for assistance.

## 2019-05-13 ENCOUNTER — Telehealth: Payer: Self-pay | Admitting: Internal Medicine

## 2019-05-13 NOTE — Telephone Encounter (Signed)
Attempted to return call to [facility]. Her phone rang continuously with no answer  Patient has not seen MD in office since 2017 but cardiology saw her during recent hospitalization

## 2019-05-13 NOTE — Telephone Encounter (Signed)
New Message      Lenna Sciara is calling from [facility] and rehab and says the pt has been having Palpitations and is wondering about a heart monitor    Please call

## 2019-05-24 NOTE — Telephone Encounter (Signed)
Tried to call daughter [place],[name]

## 2019-09-27 IMAGING — CR DG CHEST 2V
2 series · 2 of 2 positions shown · non-contrast
Comparison: Chest radiograph performed 10/03/2015

CLINICAL DATA: Acute onset of altered mental status. Vomiting and
diarrhea. Urinary incontinence. Fever.

EXAM:
CHEST  2 VIEW

[w chest lat]
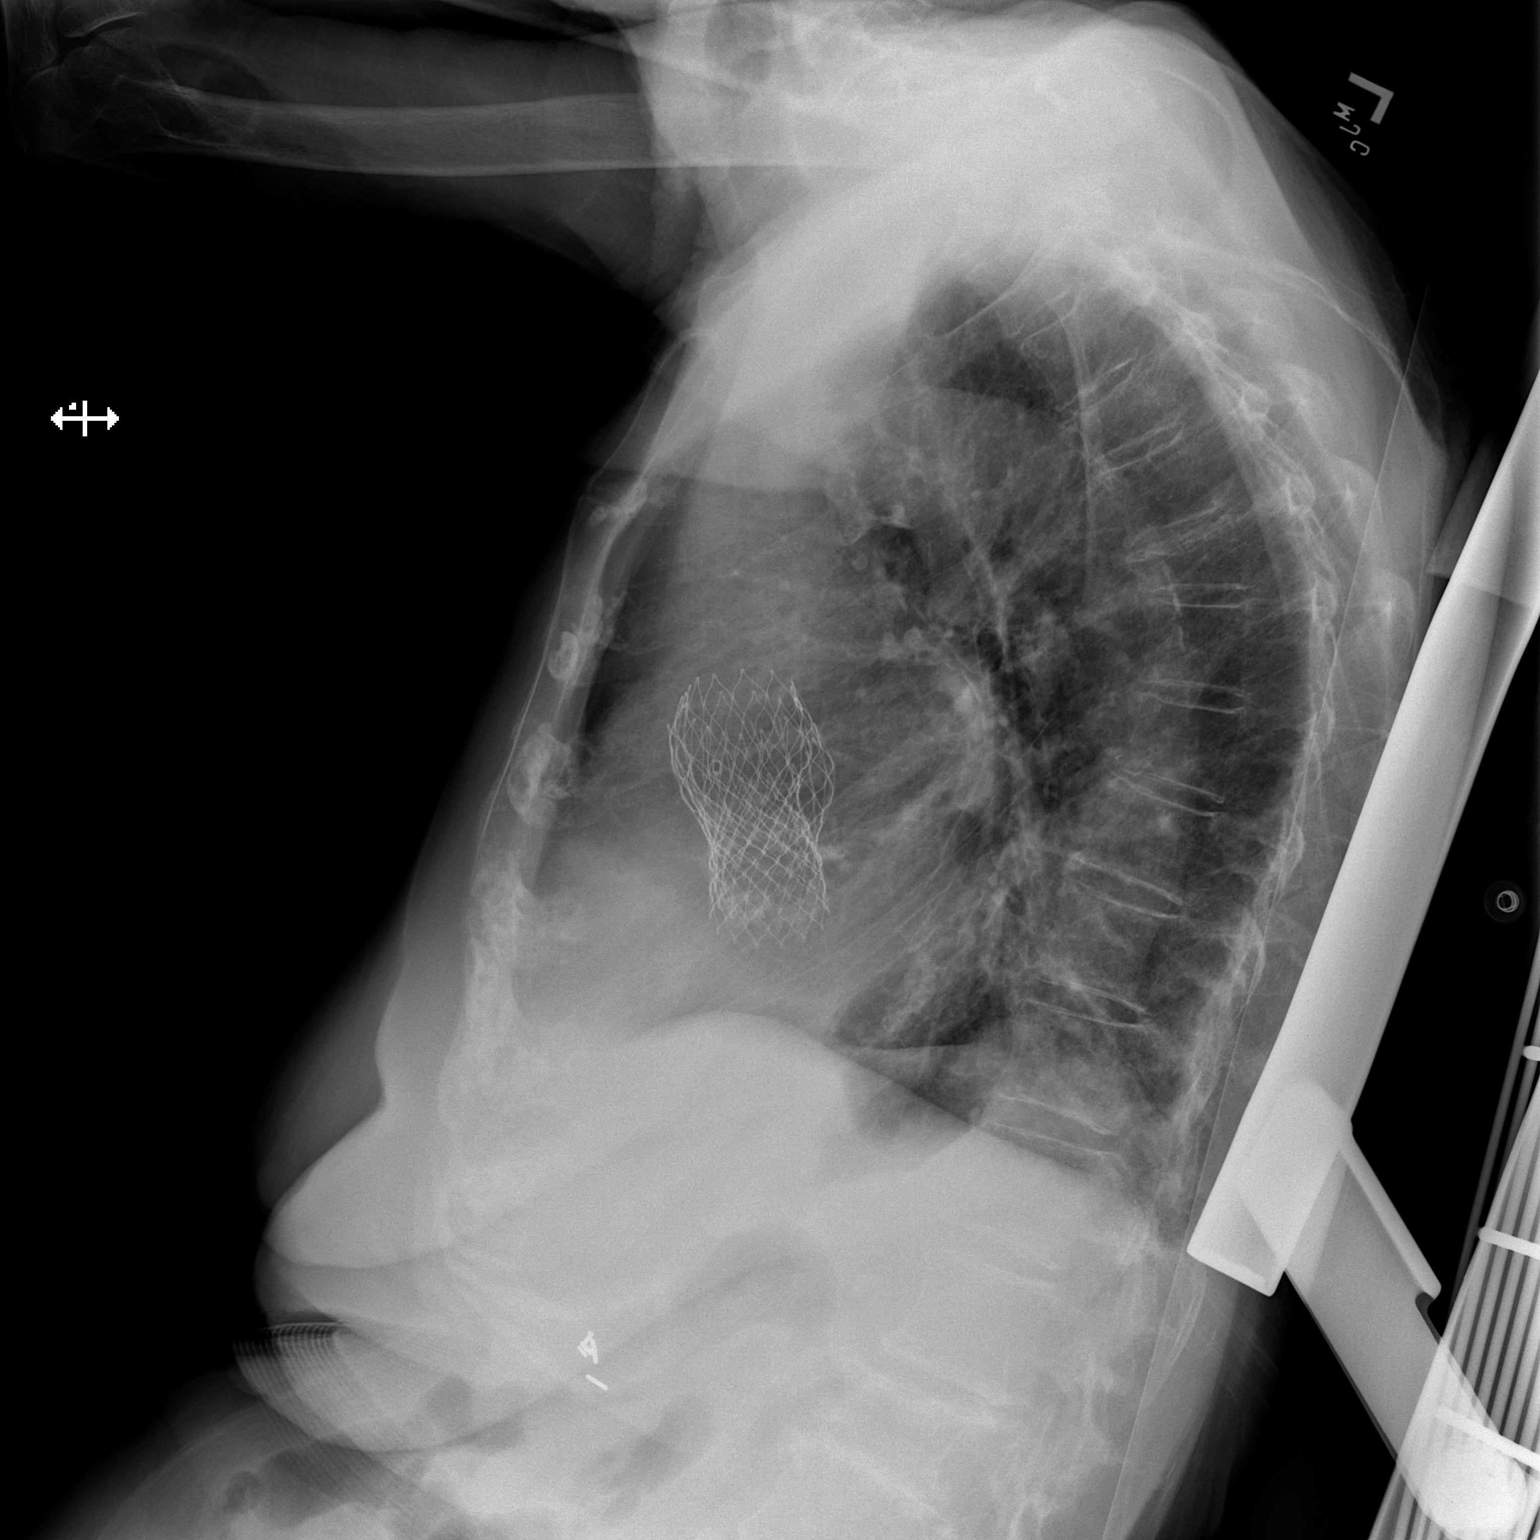

[x chest ap]
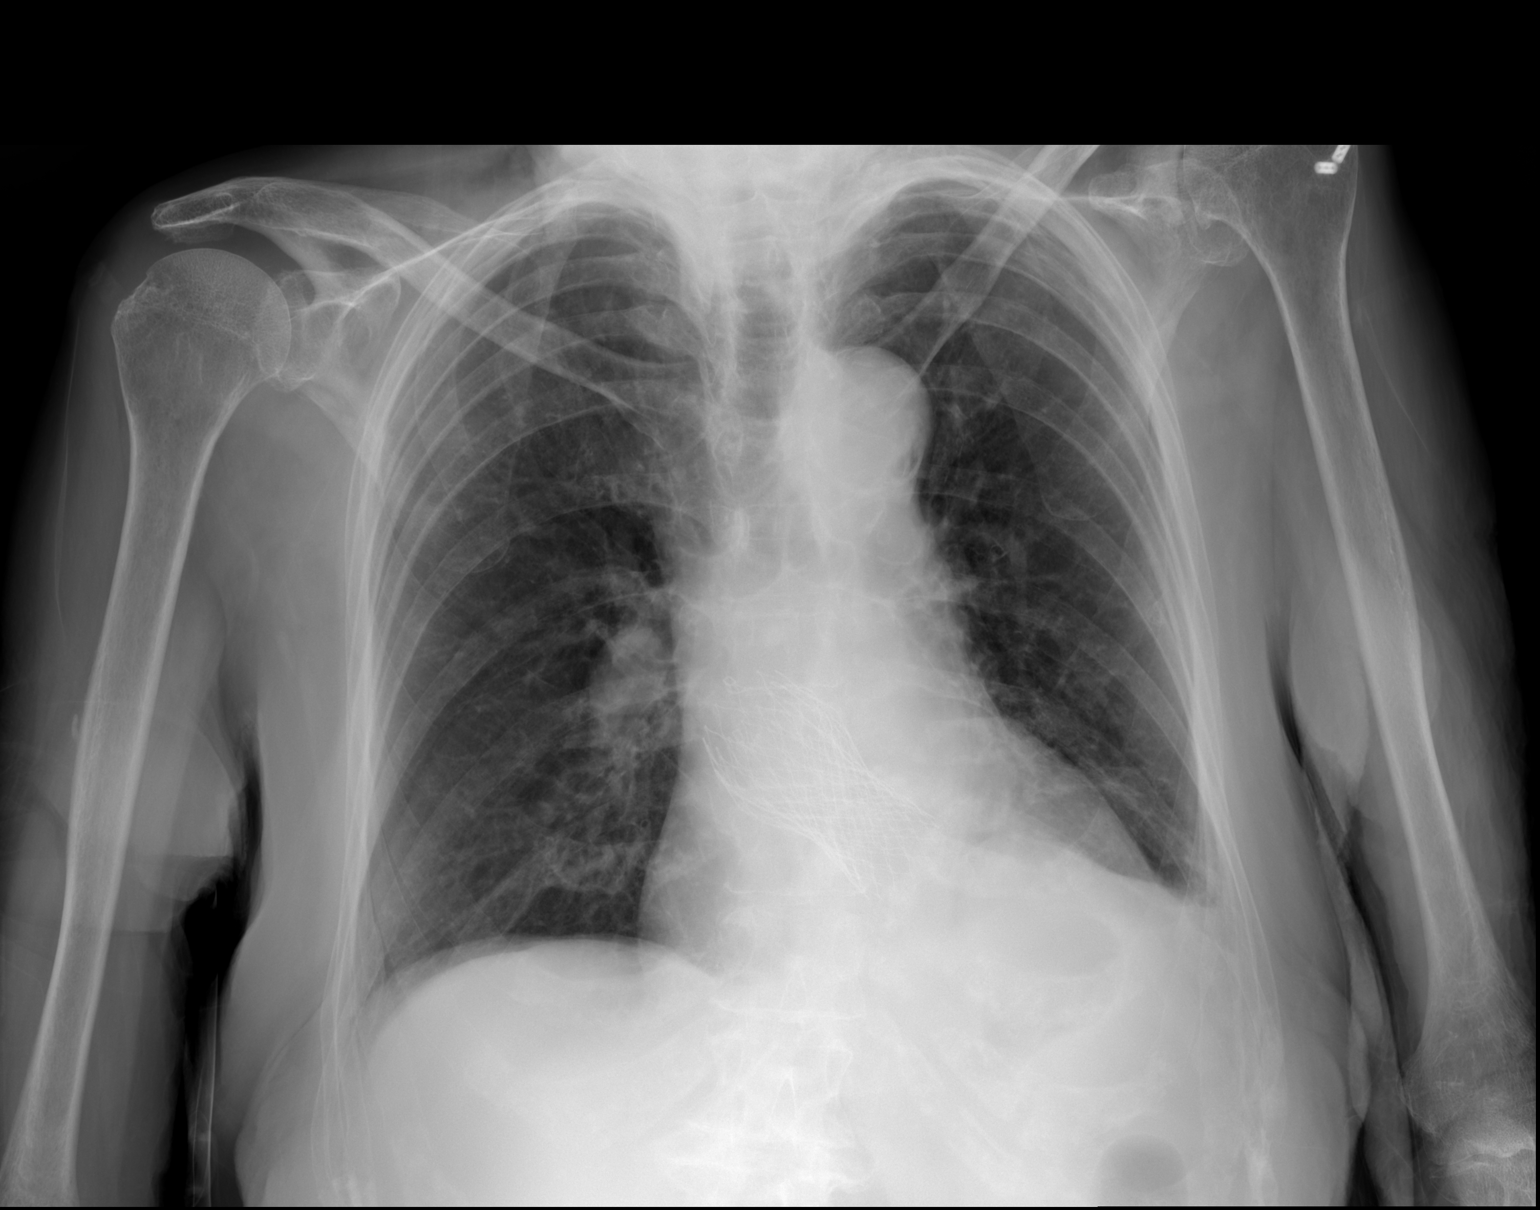

[2 of 2 positions shown; findings below may reference images not displayed]

FINDINGS: The lungs are well-aerated and clear. There is no evidence of focal
opacification, pleural effusion or pneumothorax.

The cardiomediastinal silhouette is borderline normal in size. A
vascular stent is noted overlying the aortic valve. No acute osseous
abnormalities are seen. There is chronic superior subluxation of the
left humeral head.
IMPRESSION: No acute cardiopulmonary process seen.

## 2020-11-21 DEATH — deceased
